# Patient Record
Sex: Female | Born: 1960 | Race: White | Hispanic: No | Marital: Married | Smoking: Never smoker
Health system: Southern US, Community
[De-identification: ages and names within clinical notes are randomized; demographics above are authoritative.]

## PROBLEM LIST (undated history)

## (undated) DIAGNOSIS — K7689 Other specified diseases of liver: Secondary | ICD-10-CM

## (undated) DIAGNOSIS — M2550 Pain in unspecified joint: Secondary | ICD-10-CM

## (undated) DIAGNOSIS — M1990 Unspecified osteoarthritis, unspecified site: Secondary | ICD-10-CM

## (undated) DIAGNOSIS — M7631 Iliotibial band syndrome, right leg: Secondary | ICD-10-CM

## (undated) DIAGNOSIS — E785 Hyperlipidemia, unspecified: Secondary | ICD-10-CM

## (undated) DIAGNOSIS — F32A Depression, unspecified: Secondary | ICD-10-CM

## (undated) DIAGNOSIS — E669 Obesity, unspecified: Secondary | ICD-10-CM

## (undated) DIAGNOSIS — C439 Malignant melanoma of skin, unspecified: Secondary | ICD-10-CM

## (undated) DIAGNOSIS — R0602 Shortness of breath: Secondary | ICD-10-CM

## (undated) DIAGNOSIS — K635 Polyp of colon: Secondary | ICD-10-CM

## (undated) DIAGNOSIS — Z8711 Personal history of peptic ulcer disease: Secondary | ICD-10-CM

## (undated) DIAGNOSIS — K829 Disease of gallbladder, unspecified: Secondary | ICD-10-CM

## (undated) DIAGNOSIS — R002 Palpitations: Secondary | ICD-10-CM

## (undated) DIAGNOSIS — K219 Gastro-esophageal reflux disease without esophagitis: Secondary | ICD-10-CM

## (undated) DIAGNOSIS — Z8719 Personal history of other diseases of the digestive system: Secondary | ICD-10-CM

## (undated) DIAGNOSIS — L709 Acne, unspecified: Secondary | ICD-10-CM

## (undated) DIAGNOSIS — IMO0002 Reserved for concepts with insufficient information to code with codable children: Secondary | ICD-10-CM

## (undated) DIAGNOSIS — M549 Dorsalgia, unspecified: Secondary | ICD-10-CM

## (undated) DIAGNOSIS — C9000 Multiple myeloma not having achieved remission: Secondary | ICD-10-CM

## (undated) DIAGNOSIS — R7309 Other abnormal glucose: Secondary | ICD-10-CM

## (undated) DIAGNOSIS — E559 Vitamin D deficiency, unspecified: Secondary | ICD-10-CM

## (undated) HISTORY — DX: Hyperlipidemia, unspecified: E78.5

## (undated) HISTORY — DX: Personal history of peptic ulcer disease: Z87.11

## (undated) HISTORY — DX: Other abnormal glucose: R73.09

## (undated) HISTORY — PX: OTHER SURGICAL HISTORY: SHX169

## (undated) HISTORY — DX: Acne, unspecified: L70.9

## (undated) HISTORY — DX: Other specified diseases of liver: K76.89

## (undated) HISTORY — DX: Malignant melanoma of skin, unspecified: C43.9

## (undated) HISTORY — DX: Polyp of colon: K63.5

## (undated) HISTORY — DX: Dorsalgia, unspecified: M54.9

## (undated) HISTORY — DX: Iliotibial band syndrome, right leg: M76.31

## (undated) HISTORY — DX: Shortness of breath: R06.02

## (undated) HISTORY — DX: Unspecified osteoarthritis, unspecified site: M19.90

## (undated) HISTORY — DX: Palpitations: R00.2

## (undated) HISTORY — DX: Disease of gallbladder, unspecified: K82.9

## (undated) HISTORY — DX: Obesity, unspecified: E66.9

## (undated) HISTORY — DX: Depression, unspecified: F32.A

## (undated) HISTORY — DX: Pain in unspecified joint: M25.50

## (undated) HISTORY — DX: Multiple myeloma not having achieved remission: C90.00

## (undated) HISTORY — DX: Personal history of other diseases of the digestive system: Z87.19

## (undated) HISTORY — DX: Gastro-esophageal reflux disease without esophagitis: K21.9

## (undated) HISTORY — DX: Reserved for concepts with insufficient information to code with codable children: IMO0002

## (undated) HISTORY — DX: Vitamin D deficiency, unspecified: E55.9

---

## 1997-10-15 ENCOUNTER — Inpatient Hospital Stay (HOSPITAL_COMMUNITY): Admission: AD | Admit: 1997-10-15 | Discharge: 1997-10-15 | Payer: Self-pay | Admitting: Obstetrics and Gynecology

## 1997-11-27 ENCOUNTER — Inpatient Hospital Stay (HOSPITAL_COMMUNITY): Admission: AD | Admit: 1997-11-27 | Discharge: 1997-11-27 | Payer: Self-pay | Admitting: *Deleted

## 1997-12-10 ENCOUNTER — Inpatient Hospital Stay (HOSPITAL_COMMUNITY): Admission: AD | Admit: 1997-12-10 | Discharge: 1997-12-10 | Payer: Self-pay | Admitting: Obstetrics & Gynecology

## 1997-12-11 ENCOUNTER — Inpatient Hospital Stay (HOSPITAL_COMMUNITY): Admission: AD | Admit: 1997-12-11 | Discharge: 1997-12-11 | Payer: Self-pay | Admitting: Obstetrics and Gynecology

## 1997-12-26 ENCOUNTER — Inpatient Hospital Stay (HOSPITAL_COMMUNITY): Admission: AD | Admit: 1997-12-26 | Discharge: 1997-12-26 | Payer: Self-pay | Admitting: Obstetrics & Gynecology

## 1997-12-27 ENCOUNTER — Inpatient Hospital Stay (HOSPITAL_COMMUNITY): Admission: AD | Admit: 1997-12-27 | Discharge: 1997-12-29 | Payer: Self-pay | Admitting: Obstetrics and Gynecology

## 1998-02-02 ENCOUNTER — Other Ambulatory Visit: Admission: RE | Admit: 1998-02-02 | Discharge: 1998-02-02 | Payer: Self-pay | Admitting: Obstetrics and Gynecology

## 1999-03-29 ENCOUNTER — Other Ambulatory Visit: Admission: RE | Admit: 1999-03-29 | Discharge: 1999-03-29 | Payer: Self-pay | Admitting: Obstetrics and Gynecology

## 2000-03-29 ENCOUNTER — Other Ambulatory Visit: Admission: RE | Admit: 2000-03-29 | Discharge: 2000-03-29 | Payer: Self-pay | Admitting: Obstetrics and Gynecology

## 2001-04-13 ENCOUNTER — Other Ambulatory Visit: Admission: RE | Admit: 2001-04-13 | Discharge: 2001-04-13 | Payer: Self-pay | Admitting: Obstetrics and Gynecology

## 2002-05-17 ENCOUNTER — Other Ambulatory Visit: Admission: RE | Admit: 2002-05-17 | Discharge: 2002-05-17 | Payer: Self-pay | Admitting: Obstetrics and Gynecology

## 2003-04-11 ENCOUNTER — Encounter: Payer: Self-pay | Admitting: Internal Medicine

## 2003-04-11 ENCOUNTER — Ambulatory Visit (HOSPITAL_COMMUNITY): Admission: RE | Admit: 2003-04-11 | Discharge: 2003-04-11 | Payer: Self-pay | Admitting: Internal Medicine

## 2003-07-28 ENCOUNTER — Other Ambulatory Visit: Admission: RE | Admit: 2003-07-28 | Discharge: 2003-07-28 | Payer: Self-pay | Admitting: Obstetrics and Gynecology

## 2003-09-16 ENCOUNTER — Inpatient Hospital Stay (HOSPITAL_COMMUNITY): Admission: RE | Admit: 2003-09-16 | Discharge: 2003-09-20 | Payer: Self-pay | Admitting: General Surgery

## 2003-09-16 ENCOUNTER — Encounter (INDEPENDENT_AMBULATORY_CARE_PROVIDER_SITE_OTHER): Payer: Self-pay | Admitting: Specialist

## 2003-09-16 HISTORY — PX: COLECTOMY: SHX59

## 2003-09-25 ENCOUNTER — Ambulatory Visit (HOSPITAL_COMMUNITY): Admission: RE | Admit: 2003-09-25 | Discharge: 2003-09-25 | Payer: Self-pay | Admitting: General Surgery

## 2004-07-08 ENCOUNTER — Emergency Department (HOSPITAL_COMMUNITY): Admission: EM | Admit: 2004-07-08 | Discharge: 2004-07-08 | Payer: Self-pay | Admitting: Emergency Medicine

## 2004-07-15 ENCOUNTER — Ambulatory Visit: Payer: Self-pay | Admitting: Internal Medicine

## 2004-07-16 ENCOUNTER — Ambulatory Visit: Payer: Self-pay | Admitting: Internal Medicine

## 2004-09-03 ENCOUNTER — Other Ambulatory Visit: Admission: RE | Admit: 2004-09-03 | Discharge: 2004-09-03 | Payer: Self-pay | Admitting: Obstetrics and Gynecology

## 2005-01-07 ENCOUNTER — Ambulatory Visit: Payer: Self-pay | Admitting: Family Medicine

## 2005-10-20 ENCOUNTER — Other Ambulatory Visit: Admission: RE | Admit: 2005-10-20 | Discharge: 2005-10-20 | Payer: Self-pay | Admitting: Obstetrics and Gynecology

## 2005-12-26 ENCOUNTER — Ambulatory Visit: Payer: Self-pay | Admitting: Internal Medicine

## 2006-06-28 ENCOUNTER — Ambulatory Visit: Payer: Self-pay | Admitting: Internal Medicine

## 2006-12-11 ENCOUNTER — Ambulatory Visit: Payer: Self-pay | Admitting: Internal Medicine

## 2006-12-11 LAB — CONVERTED CEMR LAB
ALT: 15 units/L (ref 0–40)
AST: 15 units/L (ref 0–37)
Albumin: 3.4 g/dL — ABNORMAL LOW (ref 3.5–5.2)
Alkaline Phosphatase: 26 units/L — ABNORMAL LOW (ref 39–117)
BUN: 13 mg/dL (ref 6–23)
Basophils Absolute: 0 10*3/uL (ref 0.0–0.1)
Basophils Relative: 0.4 % (ref 0.0–1.0)
Bilirubin, Direct: 0.1 mg/dL (ref 0.0–0.3)
CO2: 27 meq/L (ref 19–32)
Calcium: 8.9 mg/dL (ref 8.4–10.5)
Chloride: 111 meq/L (ref 96–112)
Cholesterol: 216 mg/dL (ref 0–200)
Cortisol, Plasma: 18.4 ug/dL
Creatinine, Ser: 1 mg/dL (ref 0.4–1.2)
Direct LDL: 101.9 mg/dL
Eosinophils Absolute: 0.1 10*3/uL (ref 0.0–0.6)
Eosinophils Relative: 1.2 % (ref 0.0–5.0)
GFR calc Af Amer: 78 mL/min
GFR calc non Af Amer: 64 mL/min
Glucose, Bld: 110 mg/dL — ABNORMAL HIGH (ref 70–99)
HCT: 40 % (ref 36.0–46.0)
HDL: 70.9 mg/dL (ref >39.0)
Hemoglobin: 14 g/dL (ref 12.0–15.0)
Hgb A1c MFr Bld: 5.6 % (ref 4.6–6.0)
Lymphocytes Relative: 31.1 % (ref 12.0–46.0)
MCHC: 35.1 g/dL (ref 30.0–36.0)
MCV: 89.8 fL (ref 78.0–100.0)
Monocytes Absolute: 0.3 10*3/uL (ref 0.2–0.7)
Monocytes Relative: 6.1 % (ref 3.0–11.0)
Neutro Abs: 2.8 10*3/uL (ref 1.4–7.7)
Neutrophils Relative %: 61.2 % (ref 43.0–77.0)
Platelets: 246 10*3/uL (ref 150–400)
Potassium: 3.9 meq/L (ref 3.5–5.1)
RBC: 4.46 M/uL (ref 3.87–5.11)
RDW: 12.2 % (ref 11.5–14.6)
Sodium: 141 meq/L (ref 135–145)
TSH: 2.76 microintl units/mL (ref 0.35–5.50)
Total Bilirubin: 0.6 mg/dL (ref 0.3–1.2)
Total CHOL/HDL Ratio: 3
Total Protein: 6.1 g/dL (ref 6.0–8.3)
Triglycerides: 186 mg/dL — ABNORMAL HIGH (ref 0–149)
VLDL: 37 mg/dL (ref 0–40)
WBC: 4.7 10*3/uL (ref 4.5–10.5)

## 2006-12-18 ENCOUNTER — Ambulatory Visit: Payer: Self-pay | Admitting: Internal Medicine

## 2006-12-20 DIAGNOSIS — R319 Hematuria, unspecified: Secondary | ICD-10-CM | POA: Insufficient documentation

## 2006-12-20 DIAGNOSIS — Z8719 Personal history of other diseases of the digestive system: Secondary | ICD-10-CM | POA: Insufficient documentation

## 2006-12-27 ENCOUNTER — Ambulatory Visit: Payer: Self-pay | Admitting: Internal Medicine

## 2007-02-20 ENCOUNTER — Ambulatory Visit: Payer: Self-pay | Admitting: Internal Medicine

## 2007-02-20 DIAGNOSIS — E785 Hyperlipidemia, unspecified: Secondary | ICD-10-CM | POA: Insufficient documentation

## 2007-02-26 DIAGNOSIS — R7309 Other abnormal glucose: Secondary | ICD-10-CM | POA: Insufficient documentation

## 2007-02-27 ENCOUNTER — Encounter: Payer: Self-pay | Admitting: Internal Medicine

## 2007-02-27 ENCOUNTER — Ambulatory Visit: Payer: Self-pay

## 2007-03-29 ENCOUNTER — Telehealth (INDEPENDENT_AMBULATORY_CARE_PROVIDER_SITE_OTHER): Payer: Self-pay | Admitting: *Deleted

## 2007-11-30 ENCOUNTER — Ambulatory Visit: Payer: Self-pay | Admitting: Family Medicine

## 2007-12-04 LAB — CONVERTED CEMR LAB
ALT: 13 units/L (ref 0–35)
AST: 14 units/L (ref 0–37)
Albumin: 4.3 g/dL (ref 3.5–5.2)
Alkaline Phosphatase: 34 units/L — ABNORMAL LOW (ref 39–117)
BUN: 15 mg/dL (ref 6–23)
Basophils Absolute: 0 10*3/uL (ref 0.0–0.1)
Basophils Relative: 1 % (ref 0–1)
Bilirubin, Direct: 0.1 mg/dL (ref 0.0–0.3)
CO2: 24 meq/L (ref 19–32)
Calcium: 10.1 mg/dL (ref 8.4–10.5)
Chloride: 101 meq/L (ref 96–112)
Creatinine, Ser: 0.93 mg/dL (ref 0.40–1.20)
Eosinophils Absolute: 0 10*3/uL (ref 0.0–0.7)
Eosinophils Relative: 0 % (ref 0–5)
Glucose, Bld: 88 mg/dL (ref 70–99)
HCT: 43.4 % (ref 36.0–46.0)
Hemoglobin: 15 g/dL (ref 12.0–15.0)
Indirect Bilirubin: 0.4 mg/dL (ref 0.0–0.9)
Lymphocytes Relative: 30 % (ref 12–46)
Lymphs Abs: 2 10*3/uL (ref 0.7–4.0)
MCHC: 34.6 g/dL (ref 30.0–36.0)
MCV: 87 fL (ref 78.0–100.0)
Monocytes Absolute: 0.4 10*3/uL (ref 0.1–1.0)
Monocytes Relative: 6 % (ref 3–12)
Neutro Abs: 4.2 10*3/uL (ref 1.7–7.7)
Neutrophils Relative %: 64 % (ref 43–77)
Platelets: 274 10*3/uL (ref 150–400)
Potassium: 4.6 meq/L (ref 3.5–5.3)
RBC: 4.99 M/uL (ref 3.87–5.11)
RDW: 13.1 % (ref 11.5–15.5)
Sodium: 138 meq/L (ref 135–145)
TSH: 2.503 microintl units/mL (ref 0.350–5.50)
Total Bilirubin: 0.5 mg/dL (ref 0.3–1.2)
Total Protein: 7.1 g/dL (ref 6.0–8.3)
WBC: 6.6 10*3/uL (ref 4.0–10.5)

## 2007-12-05 ENCOUNTER — Encounter: Payer: Self-pay | Admitting: Family Medicine

## 2007-12-07 ENCOUNTER — Ambulatory Visit: Payer: Self-pay | Admitting: Internal Medicine

## 2007-12-13 ENCOUNTER — Encounter: Payer: Self-pay | Admitting: Internal Medicine

## 2009-03-04 LAB — CONVERTED CEMR LAB: Pap Smear: NORMAL

## 2009-03-31 ENCOUNTER — Ambulatory Visit: Payer: Self-pay | Admitting: Internal Medicine

## 2009-04-01 ENCOUNTER — Ambulatory Visit: Payer: Self-pay | Admitting: Family Medicine

## 2009-04-03 ENCOUNTER — Ambulatory Visit: Payer: Self-pay | Admitting: Internal Medicine

## 2009-04-03 LAB — CONVERTED CEMR LAB
ALT: 15 units/L (ref 0–35)
AST: 16 units/L (ref 0–37)
Albumin: 3.6 g/dL (ref 3.5–5.2)
Alkaline Phosphatase: 32 units/L — ABNORMAL LOW (ref 39–117)
BUN: 15 mg/dL (ref 6–23)
Basophils Absolute: 0 10*3/uL (ref 0.0–0.1)
Basophils Relative: 0.8 % (ref 0.0–3.0)
Bilirubin Urine: NEGATIVE
Bilirubin, Direct: 0 mg/dL (ref 0.0–0.3)
Blood in Urine, dipstick: NEGATIVE
CO2: 29 meq/L (ref 19–32)
Calcium: 9 mg/dL (ref 8.4–10.5)
Chloride: 107 meq/L (ref 96–112)
Cholesterol: 202 mg/dL — ABNORMAL HIGH (ref 0–200)
Creatinine, Ser: 1 mg/dL (ref 0.4–1.2)
Direct LDL: 122.5 mg/dL
Eosinophils Absolute: 0 10*3/uL (ref 0.0–0.7)
Eosinophils Relative: 0.8 % (ref 0.0–5.0)
GFR calc non Af Amer: 63.49 mL/min (ref >60)
Glucose, Bld: 86 mg/dL (ref 70–99)
Glucose, Urine, Semiquant: NEGATIVE
HCT: 40.6 % (ref 36.0–46.0)
HDL: 61.3 mg/dL (ref >39.00)
Hemoglobin: 13.9 g/dL (ref 12.0–15.0)
Ketones, urine, test strip: NEGATIVE
Lymphocytes Relative: 28.6 % (ref 12.0–46.0)
Lymphs Abs: 1.5 10*3/uL (ref 0.7–4.0)
MCHC: 34.2 g/dL (ref 30.0–36.0)
MCV: 91.5 fL (ref 78.0–100.0)
Monocytes Absolute: 0.3 10*3/uL (ref 0.1–1.0)
Monocytes Relative: 6.2 % (ref 3.0–12.0)
Neutro Abs: 3.5 10*3/uL (ref 1.4–7.7)
Neutrophils Relative %: 63.6 % (ref 43.0–77.0)
Nitrite: NEGATIVE
Platelets: 220 10*3/uL (ref 150.0–400.0)
Potassium: 4.3 meq/L (ref 3.5–5.1)
Protein, U semiquant: NEGATIVE
RBC: 4.44 M/uL (ref 3.87–5.11)
RDW: 11.6 % (ref 11.5–14.6)
Sodium: 139 meq/L (ref 135–145)
Specific Gravity, Urine: 1.02
TSH: 1.25 microintl units/mL (ref 0.35–5.50)
Total Bilirubin: 0.5 mg/dL (ref 0.3–1.2)
Total CHOL/HDL Ratio: 3
Total Protein: 6.1 g/dL (ref 6.0–8.3)
Triglycerides: 112 mg/dL (ref 0.0–149.0)
Urobilinogen, UA: 0.2
VLDL: 22.4 mg/dL (ref 0.0–40.0)
WBC Urine, dipstick: NEGATIVE
WBC: 5.3 10*3/uL (ref 4.5–10.5)
pH: 5.5

## 2009-04-09 ENCOUNTER — Ambulatory Visit: Payer: Self-pay | Admitting: Internal Medicine

## 2009-05-25 ENCOUNTER — Ambulatory Visit: Payer: Self-pay | Admitting: Internal Medicine

## 2009-08-21 ENCOUNTER — Ambulatory Visit: Payer: Self-pay | Admitting: Internal Medicine

## 2009-08-21 DIAGNOSIS — R002 Palpitations: Secondary | ICD-10-CM | POA: Insufficient documentation

## 2009-08-21 DIAGNOSIS — E669 Obesity, unspecified: Secondary | ICD-10-CM | POA: Insufficient documentation

## 2009-11-06 ENCOUNTER — Ambulatory Visit: Payer: Self-pay | Admitting: Internal Medicine

## 2010-07-04 LAB — HM MAMMOGRAPHY: HM Mammogram: NORMAL

## 2010-07-04 LAB — HM PAP SMEAR: HM Pap smear: ABNORMAL

## 2010-08-05 NOTE — Consult Note (Signed)
Summary: Dr Jenne Pane note  Dr Jenne Pane note   Imported By: Kassie Mends 01/02/2008 09:10:28  _____________________________________________________________________  External Attachment:    Type:   Image     Comment:   Dr Jenne Pane note

## 2010-08-05 NOTE — Assessment & Plan Note (Signed)
Summary: bilateral ear fullness/dm   Vital Signs:  Patient profile:   50 year old female Weight:      183 pounds Temp:     98.4 degrees F Pulse rate:   64 / minute Resp:     12 per minute BP sitting:   112 / 68  (left arm)  Vitals Entered By: Gladis Riffle, RN (March 31, 2009 11:48 AM)  History of Present Illness: ear pressure for several weeks no pain slightly dizzy (resolved)  Allergies: 1)  Penicillin V Potassium (Penicillin V Potassium)  Comments:  Nurse/Medical Assistant: c/o bilateral ear fullness since Labor Day weekend, had ears candled last week--ears feel "ec and difficulty hearinghoy"--declines flu shot  The patient's medications and allergies were reviewed with the patient and were updated in the Medication and Allergy Lists. Gladis Riffle, RN (March 31, 2009 11:50 AM)  Past History:  Past Medical History: Last updated: 02/26/2007 Diverticulitis, hx of increased glucose hematuria  2008 Hyperlipidemia hyperglycemia  Past Surgical History: Last updated: 12/20/2006 partial colectomy  2005  Family History: Last updated: 02/20/2007 valvular heart disease--mother mother s/p angioplasty Family History of CAD Female 1st degree relative <60 PGF-MI in his 28s  Social History: Last updated: 12/07/2007 Married Regular exercise-yes works for radio advertising  Risk Factors: Exercise: yes (02/20/2007)  Risk Factors: Smoking Status: never (12/20/2006)  Review of Systems       All other systems reviewed and were negative   Physical Exam  General:  Well-developed,well-nourished,in no acute distress; alert,appropriate and cooperative throughout examination Head:  normocephalic and atraumatic.   Eyes:  pupils equal and pupils round.   Ears:  External ear exam shows no significant lesions or deformities.  Otoscopic examination reveals clear canals, tympanic membranes are intact bilaterally without bulging, retraction, inflammation or discharge.  Hearing is grossly normal bilaterally. Neck:  No deformities, masses, or tenderness noted.   Impression & Recommendations:  Problem # 1:  EUSTACHIAN TUBE DYSFUNCTION, BILATERAL (ICD-381.81) eustachian tube dysfunction--by history no obviousl abnormalities discussed valsalva maneuver she will call for worsening of sxs  Complete Medication List: 1)  Yaz 3-0.02 Mg Tabs (Drospirenone-ethinyl estradiol) 2)  Cyclobenzaprine Hcl 10 Mg Tabs (Cyclobenzaprine hcl) .... One by mouth q 8 hours as needed muscle spasm
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
## 2010-08-05 NOTE — Progress Notes (Signed)
Summary: Refill for xanax  Phone Note Call from Patient Call back at [phone]   Caller: Patient Call For: Dr Cato Mulligan Reason for Call: Refill Medication Summary of Call: pt will be having a procedure done on leg on 9/26 and would like to get a rx for xanax. pt would like to pickup today. Pls call when ready to pickup  Initial call taken by: Shan Levans,  March 29, 2007 8:58 AM  Follow-up for Phone Call        Pt is having a skin lesion removed and wanted to take Xanax before her procedure.  Suggested she discuss with the surgeon for this particular situation.  She then asked if we would refill her Xanax anyway.  You gave her #10  Follow-up by: Lynann Beaver CMA,  March 29, 2007 9:54 AM  Additional Follow-up for Phone Call Additional follow up Details #1::        ok Additional Follow-up by: Birdie Sons MD,  March 29, 2007 10:56 AM    Additional Follow-up for Phone Call Additional follow up Details #2::    #10 Alprazolam .25 mg, one by mouth as needed [facility] into [facility], pt informed Follow-up by: Sid Falcon LPN,  March 29, 2007 5:01 PM  New/Updated Medications: ALPRAZOLAM 0.25 MG  TABS (ALPRAZOLAM) one by mouth as needed   Prescriptions: ALPRAZOLAM 0.25 MG  TABS (ALPRAZOLAM) one by mouth as needed  #10 x 0   Entered by:   Sid Falcon LPN   Authorized by:   Birdie Sons MD   Signed by:   Sid Falcon LPN on 62/13/0865   Method used:   Telephoned to ...         RxID:   [id number]

## 2010-08-05 NOTE — Assessment & Plan Note (Signed)
Summary: CP OFF & ON X2WKS OF CONCERN/NOT RIGHT/PS  Medications Added PHENTERMINE HCL 37.5 MG  CAPS (PHENTERMINE HCL) 1/2 bid        Vital Signs:  Patient Profile:   50 Years Old Female Weight:      169 pounds Temp:     98.1 degrees F oral Pulse rate:   76 / minute Pulse rhythm:   regular Resp:     12 per minute BP sitting:   120 / 82  Vitals Entered By: Lynann Beaver CMA (February 20, 2007 8:32 AM)               Chief Complaint:  chest pains with left arm numbness.  Acute Visit History:      The patient complains of chest pain.  She denies abdominal pain, constipation, cough, diarrhea, earache, eye symptoms, fever, genitourinary symptoms, headache, musculoskeletal symptoms, nasal discharge, nausea, rash, sinus problems, sore throat, and vomiting.  Other comments include: she thinks sxs are related to phentermine. since decreasing dose she thinks sxs are somewhat better.  she is able to exercise without chest pain or SOB.        The chest pain is located in the left anterior chest region and radiates to the left shoulder.  She describes the quality as a sharp sensation.  On a scale of 1-10, the intensity is described as a 3 (with 10 being the most severe).  The pain started 1 week ago.  Each episode of pain lasts more than 20 minutes.  She is currently not having any pain.  She has had no chest pain in the last 24 hours.        The chest pain is made worse with stress.        Other comments include:  startedd diet pills, caused insomnia, she decreased dose.                Past Medical History:    Reviewed history from 12/20/2006 and no changes required:       Diverticulitis, hx of       increased glucose       hematuria  2008       Hyperlipidemia  Past Surgical History:    Reviewed history from 12/20/2006 and no changes required:       partial colectomy  2005   Family History:    valvular heart disease--mother    mother s/p angioplasty    Family History of CAD  Female 1st degree relative <60    PGF-MI in his 20s  Social History:    Married    Regular exercise-yes   Risk Factors:  Exercise:  yes   Review of Systems       no other complaints---   Physical Exam  General:     Well-developed,well-nourished,in no acute distress; alert,appropriate and cooperative throughout examination Head:     Normocephalic and atraumatic without obvious abnormalities. No apparent alopecia or balding. Eyes:     No corneal or conjunctival inflammation noted. EOMI. Perrla. Funduscopic exam benign, without hemorrhages, exudates or papilledema. Vision grossly normal. Mouth:     Oral mucosa and oropharynx without lesions or exudates.  Teeth in good repair. Neck:     No deformities, masses, or tenderness noted. Chest Wall:     No deformities, masses, or tenderness noted. Lungs:     Normal respiratory effort, chest expands symmetrically. Lungs are clear to auscultation, no crackles or wheezes. Heart:     Normal rate and  regular rhythm. S1 and S2 normal without gallop, murmur, click, rub or other extra sounds. Abdomen:     Bowel sounds positive,abdomen soft and non-tender without masses, organomegaly or hernias noted. Msk:     No deformity or scoliosis noted of thoracic or lumbar spine.   Extremities:     No clubbing, cyanosis, edema, or deformity noted with normal full range of motion of all joints.      Impression & Recommendations:  Problem # 1:  CHEST PAIN (ICD-786.50) stress myoview note risk facotrs and abnormal EKG Orders: Cardiology Referral (Cardiology)  discussed at length, she needs further evaluation.    Problem # 2:  FAMILY HISTORY OF CAD FEMALE 1ST DEGREE RELATIVE <60 (ICD-V16.49)  Orders: Cardiology Referral (Cardiology)   Problem # 3:  HYPERLIPIDEMIA (ICD-272.4)  Orders: Cardiology Referral (Cardiology)   Complete Medication List: 1)  Phentermine Hcl 37.5 Mg Caps (Phentermine hcl) .... 1/2 bid      Appended  Document: Orders Update     Clinical Lists Changes  Orders: Added new Service order of EKG w/ Interpretation (93000) - Signed

## 2010-08-05 NOTE — Assessment & Plan Note (Signed)
Summary: 2 MONTH ROV/NJR PT RSC/NJR   Vital Signs:  Patient profile:   50 year old female Menstrual status:  irregular Weight:      167 pounds BMI:     27.47 Temp:     98.2 degrees F Pulse rate:   68 / minute Pulse rhythm:   regular Resp:     12 per minute BP sitting:   108 / 72  (left arm) Cuff size:   regular  Vitals Entered By: Gladis Riffle, RN (Nov 06, 2009 8:19 AM) CC: 2 month rov--requests to go back on phentermine 30mg  once a day Is Patient Diabetic? No   CC:  2 month rov--requests to go back on phentermine 30mg  once a day.  History of Present Illness:  Follow-Up Visit      This is a 50 year old woman who presents for Follow-up visit.  The patient denies chest pain and palpitations.  Since the last visit the patient notes no new problems or concerns.  The patient reports taking meds as prescribed.  When questioned about possible medication side effects, the patient notes none.    All other systems reviewed and were negative   Preventive Screening-Counseling & Management  Alcohol-Tobacco     Smoking Status: never  Current Problems (verified): 1)  Palpitations  (ICD-785.1) 2)  Obesity  (ICD-278.00) 3)  Preventive Health Care  (ICD-V70.0) 4)  Hyperglycemia  (ICD-790.29) 5)  Hyperlipidemia  (ICD-272.4) 6)  Family History of Cad Female 1st Degree Relative <60  (ICD-V16.49) 7)  Hematuria  (ICD-599.7) 8)  Diverticulitis, Hx of  (ICD-V12.79)  Current Medications (verified): 1)  Yaz 3-0.02 Mg Tabs (Drospirenone-Ethinyl Estradiol) 2)  Phentermine Hcl 15 Mg Caps (Phentermine Hcl) .... 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   Head:  normocephalic and atraumatic.   Eyes:  pupils equal and pupils round.   Ears:  R ear normal and L ear normal.   Nose:  no external deformity and no external erythema.   Neck:  No deformities, masses, or tenderness noted. Chest Wall:  No deformities, masses, or tenderness noted. Lungs:  normal respiratory effort and no intercostal retractions.   Heart:  normal rate and regular rhythm.   Abdomen:  soft and non-tender.   Msk:  No deformity or scoliosis noted of thoracic or lumbar spine.   Neurologic:  cranial nerves II-XII intact and gait normal.   Skin:  turgor normal and color normal.   Psych:  normally interactive and good eye contact.     Impression & Recommendations:  Problem # 1:  PALPITATIONS (ICD-785.1) resolved AND using the higher dose of phentermine  Problem # 2:  OBESITY (ICD-278.00) she has had some weight loss using phentermine as needed  ok to continue.  Problem # 3:  HYPERLIPIDEMIA (ICD-272.4)  Labs Reviewed: SGOT: 16 (04/03/2009)   SGPT: 15 (04/03/2009)   HDL:61.30 (04/03/2009), 70.9 (12/11/2006)  LDL:DEL (12/11/2006)  Chol:202 (04/03/2009), 216 (12/11/2006)  Trig:112.0 (04/03/2009), 186 (12/11/2006)  Complete Medication List: 1)  Yaz 3-0.02 Mg Tabs (Drospirenone-ethinyl estradiol) 2)  Phentermine Hcl 30 Mg Caps (Phentermine hcl) .... Take 1 tablet by mouth once a day  Patient Instructions: 1)  2months Prescriptions: PHENTERMINE HCL 30 MG CAPS (PHENTERMINE  HCL) Take 1 tablet by mouth once a day  #30 x 1   Entered and Authorized by:   Birdie Sons MD   Signed by:   Birdie Sons MD on 11/06/2009   Method used:   Print then Give to Patient   RxID:   [id number]

## 2010-08-05 NOTE — Assessment & Plan Note (Signed)
Summary: "Flutter" sensation in throat,chest/nn   Vital Signs:  Patient Profile:   50 Years Old Female Weight:      174 pounds Temp:     98.2 degrees F oral Pulse rate:   83 / minute BP sitting:   122 / 84  (left arm) Cuff size:   regular  Vitals Entered By: Alfred Levins, CMA (Nov 30, 2007 2:32 PM)                 Chief Complaint:  "flutter" in throat & upper chest.  History of Present Illness: Over the past 2 days has had intermittent spells of a mild pressure sensation and a fluttering in the anterior throat area, just above the manubrium. No ST or fever or cough. No heartburn or trouble swallowing. No SOB or chest pain. She is very anxious that she may have a heart problem because both of her parents have heart issues. She had a normal cardiac stress test in 8-08 and a normal ECHO in 2002. She denies any possibility that this could be related to anxiety.     Current Allergies: PENICILLIN V POTASSIUM (PENICILLIN V POTASSIUM)  Past Medical History:    Reviewed history from 02/26/2007 and no changes required:       Diverticulitis, hx of       increased glucose       hematuria  2008       Hyperlipidemia       hyperglycemia     Review of Systems      See HPI   Physical Exam  General:     Well-developed,well-nourished,in no acute distress; alert,appropriate and cooperative throughout examination Neck:     No deformities, masses, or tenderness noted. Lungs:     Normal respiratory effort, chest expands symmetrically. Lungs are clear to auscultation, no crackles or wheezes. Heart:     Normal rate and regular rhythm. S1 and S2 normal without gallop, murmur, click, rub or other extra sounds. EKG normal except a single PVC Psych:     Oriented X3, normally interactive, good eye contact, and moderately anxious.      Impression & Recommendations:  Problem # 1:  PALPITATIONS (ICD-785.1)  Orders: Venipuncture (16109) TLB-BMP (Basic Metabolic Panel-BMET)  (80048-METABOL) TLB-CBC Platelet - w/Differential (85025-CBCD) TLB-Hepatic/Liver Function Pnl (80076-HEPATIC) TLB-TSH (Thyroid Stimulating Hormone) (84443-TSH) EKG w/ Interpretation (93000)   Complete Medication List: 1)  Phentermine Hcl 37.5 Mg Caps (Phentermine hcl) .... 1/2 bid 2)  Claritin 10 Mg Tabs (Loratadine) .... Take 1 tablet by mouth once a day 3)  Yaz 3-0.02 Mg Tabs (Drospirenone-ethinyl estradiol) 4)  Alprazolam 0.25 Mg Tabs (Alprazolam) .... One by mouth as needed   Patient Instructions: 1)  Check labs. In case this is related to GERD, try samples for Nexium 40 mg once daily . If it continues, we may set up a Holtor or event monitor.   ]

## 2010-08-05 NOTE — Assessment & Plan Note (Signed)
Summary: CPX/NO PAP/NJR   Vital Signs:  Patient profile:   50 year old female Menstrual status:  irregular LMP:     03/23/2009 Height:      65.5 inches Weight:      183 pounds BMI:     30.10 Pulse rate:   64 / minute Resp:     12 per minute BP sitting:   118 / 70  (left arm)  Vitals Entered By: Gladis Riffle, RN (April 09, 2009 8:07 AM)  History of Present Illness: cpx  still with neck pain  Current Problems (verified): 1)  Preventive Health Care  (ICD-V70.0) 2)  Hyperglycemia  (ICD-790.29) 3)  Hyperlipidemia  (ICD-272.4) 4)  Family History of Cad Female 1st Degree Relative <60  (ICD-V16.49) 5)  Hematuria  (ICD-599.7) 6)  Diverticulitis, Hx of  (ICD-V12.79)  Current Medications (verified): 1)  Yaz 3-0.02 Mg Tabs (Drospirenone-Ethinyl Estradiol)  Allergies: 1)  Penicillin V Potassium (Penicillin V Potassium)  Comments:  Nurse/Medical Assistant: cpx, has gyn, labs done--concerned about weight and insomnia--declines flu shot  The patient's medications were reviewed with the patient's parent and were updated in the Medication List. 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   Eyes:  pupils equal and pupils round.   Ears:  R ear normal and L ear normal.   Nose:  no external deformity and no external erythema.   Neck:  No deformities, masses, or tenderness noted. Chest Wall:  No deformities, masses, or tenderness noted. Lungs:  Normal respiratory effort, chest expands symmetrically. Lungs are clear to auscultation, no crackles or wheezes. Heart:  Normal rate and regular rhythm. S1 and S2 normal without gallop, murmur, click, rub or other extra sounds. Abdomen:  Bowel sounds positive,abdomen soft and non-tender without masses, organomegaly or hernias noted. Msk:  No deformity or scoliosis noted of thoracic or lumbar spine.   Pulses:  R radial normal and L radial normal.   Neurologic:  cranial nerves II-XII intact and gait normal.   Skin:  turgor normal and color normal.   Psych:  normally interactive and good eye contact.     Impression & Recommendations:  Problem # 1:  PREVENTIVE HEALTH CARE (ICD-V70.0) health maintenance UTD she has had trouble sleeping---lots of stress @ home---discussed melatonin  Problem # 2:  HYPERLIPIDEMIA (ICD-272.4) note HDL, no treatment Labs Reviewed: SGOT: 16 (04/03/2009)   SGPT: 15 (04/03/2009)   HDL:61.30 (04/03/2009), 70.9 (12/11/2006)  LDL:DEL (12/11/2006)  Chol:202 (04/03/2009), 216 (12/11/2006)  Trig:112.0 (04/03/2009), 186 (12/11/2006)  Problem # 3:  HYPERGLYCEMIA (ICD-790.29) resolved Labs Reviewed: Creat:  1.0 (04/03/2009)     Complete Medication List: 1)  Yaz 3-0.02 Mg Tabs (Drospirenone-ethinyl estradiol) 2)  Phentermine Hcl 30 Mg Caps (Phentermine hcl) .... Take 1 tablet by mouth  once a day  Preventive Care Screening  Mammogram:    Date:  03/04/2009    Next Due:  03/2011    Results:  normal-pt's report   Pap Smear:    Date:  03/04/2009    Next Due:  03/2012    Results:  normal-pt's report    Patient Instructions: 1)  6 weeks ROV 2)  sleep: try melatonin (package instructions) Prescriptions: PHENTERMINE HCL 30 MG CAPS (PHENTERMINE HCL) Take 1 tablet by mouth once a day  #30 x 3   Entered and Authorized by:   Birdie Sons MD   Signed by:   Birdie Sons MD on 04/09/2009   Method used:   Print then Give to Patient   RxID:   [id number]   Appended Document: CPX/NO PAP/NJR   Immunizations Administered:  Tetanus Vaccine:    Vaccine Type: Tdap    Site: left deltoid    Mfr: [employer]    Dose: 0.5 ml    Route: IM    Given by: Gladis Riffle, RN    Exp. Date: 01/17/2011    Lot #: [id number]

## 2010-08-05 NOTE — Assessment & Plan Note (Signed)
Summary: 6 WK ROV//SLM PT RSC/NJR/PT RSC/CJR   Vital Signs:  Patient profile:   50 year old female Menstrual status:  irregular Weight:      174 pounds Temp:     98.4 degrees F oral Pulse rate:   56 / minute Pulse rhythm:   regular BP sitting:   102 / 78  (left arm) Cuff size:   regular  Vitals Entered By: Kern Reap CMA Duncan Dull) (August 21, 2009 7:53 AM)  Reason for Visit 6 week follow up and rx concerns  History of Present Illness: f/u has not been using phentermine  she has noted a strange sensation of left arm---intermittent for 3 weeks. reproducible with stress. she describes the arm more as a "heavy sensation".  She does note one episode of heart fluttering/palpitations but not related to arm paresthesia no SOB, she is able to exercise vigorously without any sxs no nocturnal sxs  she notes fHx of valvluar heart dzs  Current Problems (verified): 1)  Preventive Health Care  (ICD-V70.0) 2)  Hyperglycemia  (ICD-790.29) 3)  Hyperlipidemia  (ICD-272.4) 4)  Family History of Cad Female 1st Degree Relative <60  (ICD-V16.49) 5)  Hematuria  (ICD-599.7) 6)  Diverticulitis, Hx of  (ICD-V12.79)  Current Medications (verified): 1)  Yaz 3-0.02 Mg Tabs (Drospirenone-Ethinyl Estradiol) 2)  Phentermine Hcl 30 Mg Caps (Phentermine Hcl) .... Take 1 Tablet By Mouth Once A Day  Allergies: 1)  Penicillin V Potassium (Penicillin V Potassium)  Past History:  Past Medical History: Last updated: 02/26/2007 Diverticulitis, hx of increased glucose hematuria  2008 Hyperlipidemia hyperglycemia  Past Surgical History: Last updated: 12/20/2006 partial colectomy  2005  Family History: Last updated: 08/21/2009 valvular heart disease--mother (required surgery) mother s/p angioplasty Family History of CAD Female 1st degree relative <60 PGF-MI in his 47s father--healthy  Social History: Last updated: 04/09/2009 Married Regular exercise-yes works for radio advertising 2  kids---health  Risk Factors: Exercise: yes (02/20/2007)  Risk Factors: Smoking Status: never (05/25/2009)  Family History: valvular heart disease--mother (required surgery) mother s/p angioplasty Family History of CAD Female 1st degree relative <60 PGF-MI in his 75s father--healthy  Review of Systems       All other systems reviewed and were negative   Physical Exam  General:  Well-developed,well-nourished,in no acute distress; alert,appropriate and cooperative throughout examination Head:  normocephalic and atraumatic.   Eyes:  pupils equal and pupils round.   Neck:  No deformities, masses, or tenderness noted. Chest Wall:  No deformities, masses, or tenderness noted. Lungs:  Normal respiratory effort, chest expands symmetrically. Lungs are clear to auscultation, no crackles or wheezes. Heart:  Normal rate and regular rhythm. S1 and S2 normal without gallop, murmur, click, rub or other extra sounds. Abdomen:  Bowel sounds positive,abdomen soft and non-tender without masses, organomegaly or hernias noted. Msk:  No deformity or scoliosis noted of thoracic or lumbar spine.   Pulses:  R radial normal and L radial normal.   Neurologic:  cranial nerves II-XII intact and gait normal.   Skin:  turgor normal and color normal.   Psych:  normally interactive and good eye contact.     Impression & Recommendations:  Problem # 1:  OBESITY (ICD-278.00) overweight-- ok to continue phentermine  Problem # 2:  PALPITATIONS (ICD-785.1) I do not think this is an SVT. no indication of WPW she will call if sxs recur I suspect an anxiety reaction Orders: EKG w/ Interpretation (93000)  Complete Medication List: 1)  Yaz 3-0.02 Mg Tabs (Drospirenone-ethinyl estradiol)  2)  Phentermine Hcl 15 Mg Caps (Phentermine hcl) .... Take 1 tablet by mouth two times a day  Patient Instructions: 1)  Please schedule a follow-up appointment in 2 months. Prescriptions: PHENTERMINE HCL 15 MG CAPS  (PHENTERMINE HCL) Take 1 tablet by mouth two times a day  #60 x 3   Entered and Authorized by:   Birdie Sons MD   Signed by:   Birdie Sons MD on 08/21/2009   Method used:   Print then Give to Patient   RxID:   [id number] PHENTERMINE HCL 30 MG CAPS (PHENTERMINE HCL) Take 1 tablet by mouth once a day  #30 x 3   Entered and Authorized by:   Birdie Sons MD   Signed by:   Birdie Sons MD on 08/21/2009   Method used:   Print then Give to Patient   RxID:   [id number]  ---canceled 30 mg---not given to patient

## 2010-08-20 ENCOUNTER — Telehealth: Payer: Self-pay | Admitting: Internal Medicine

## 2010-08-20 ENCOUNTER — Other Ambulatory Visit: Payer: Self-pay | Admitting: Internal Medicine

## 2010-08-20 ENCOUNTER — Other Ambulatory Visit: Payer: BC Managed Care – PPO

## 2010-08-20 ENCOUNTER — Encounter (INDEPENDENT_AMBULATORY_CARE_PROVIDER_SITE_OTHER): Payer: Self-pay | Admitting: *Deleted

## 2010-08-20 DIAGNOSIS — Z0000 Encounter for general adult medical examination without abnormal findings: Secondary | ICD-10-CM

## 2010-08-20 LAB — TSH: TSH: 2.47 u[IU]/mL (ref 0.35–5.50)

## 2010-08-20 LAB — HEPATIC FUNCTION PANEL
ALT: 12 U/L (ref 0–35)
AST: 15 U/L (ref 0–37)
Albumin: 3.6 g/dL (ref 3.5–5.2)
Alkaline Phosphatase: 32 U/L — ABNORMAL LOW (ref 39–117)
Bilirubin, Direct: 0.1 mg/dL (ref 0.0–0.3)
Total Bilirubin: 0.4 mg/dL (ref 0.3–1.2)
Total Protein: 6.1 g/dL (ref 6.0–8.3)

## 2010-08-20 LAB — CBC WITH DIFFERENTIAL/PLATELET
Basophils Absolute: 0 10*3/uL (ref 0.0–0.1)
Basophils Relative: 0.6 % (ref 0.0–3.0)
Eosinophils Absolute: 0 10*3/uL (ref 0.0–0.7)
Eosinophils Relative: 1 % (ref 0.0–5.0)
HCT: 40.2 % (ref 36.0–46.0)
Hemoglobin: 14.1 g/dL (ref 12.0–15.0)
Lymphocytes Relative: 25.4 % (ref 12.0–46.0)
Lymphs Abs: 1.2 10*3/uL (ref 0.7–4.0)
MCHC: 35 g/dL (ref 30.0–36.0)
MCV: 89.8 fl (ref 78.0–100.0)
Monocytes Absolute: 0.3 10*3/uL (ref 0.1–1.0)
Monocytes Relative: 6.5 % (ref 3.0–12.0)
Neutro Abs: 3.1 10*3/uL (ref 1.4–7.7)
Neutrophils Relative %: 66.5 % (ref 43.0–77.0)
Platelets: 213 10*3/uL (ref 150.0–400.0)
RBC: 4.48 Mil/uL (ref 3.87–5.11)
RDW: 12.7 % (ref 11.5–14.6)
WBC: 4.7 10*3/uL (ref 4.5–10.5)

## 2010-08-20 LAB — LIPID PANEL
Cholesterol: 180 mg/dL (ref 0–200)
HDL: 56.9 mg/dL (ref >39.00)
LDL Cholesterol: 94 mg/dL (ref 0–99)
Total CHOL/HDL Ratio: 3
Triglycerides: 147 mg/dL (ref 0.0–149.0)
VLDL: 29.4 mg/dL (ref 0.0–40.0)

## 2010-08-20 LAB — BASIC METABOLIC PANEL
BUN: 15 mg/dL (ref 6–23)
CO2: 27 mEq/L (ref 19–32)
Calcium: 8.9 mg/dL (ref 8.4–10.5)
Chloride: 104 mEq/L (ref 96–112)
Creatinine, Ser: 0.8 mg/dL (ref 0.4–1.2)
GFR: 79.35 mL/min (ref >60.00)
Glucose, Bld: 80 mg/dL (ref 70–99)
Potassium: 4.2 mEq/L (ref 3.5–5.1)
Sodium: 137 mEq/L (ref 135–145)

## 2010-08-20 LAB — URINALYSIS
Bilirubin Urine: NEGATIVE
Ketones, ur: NEGATIVE
Leukocytes, UA: NEGATIVE
Nitrite: NEGATIVE
Specific Gravity, Urine: 1.025 (ref 1.000–1.030)
Total Protein, Urine: NEGATIVE
Urine Glucose: NEGATIVE
Urobilinogen, UA: 0.2 (ref 0.0–1.0)
pH: 6 (ref 5.0–8.0)

## 2010-08-20 LAB — CORTISOL: Cortisol, Plasma: 17.4 ug/dL

## 2010-08-20 NOTE — Telephone Encounter (Signed)
Pt called and adv that she wants to have her cortisone levels? Checked..... Had bloodwork drawn this morning at [facility] and needs to have order added to same.....[id number].

## 2010-08-20 NOTE — Telephone Encounter (Signed)
Sent to [facility] to order Cortisol levels.

## 2010-08-20 NOTE — Telephone Encounter (Signed)
Ok to add

## 2010-08-26 ENCOUNTER — Other Ambulatory Visit: Payer: Self-pay | Admitting: Internal Medicine

## 2010-08-26 ENCOUNTER — Encounter: Payer: Self-pay | Admitting: Internal Medicine

## 2010-08-26 ENCOUNTER — Ambulatory Visit (INDEPENDENT_AMBULATORY_CARE_PROVIDER_SITE_OTHER): Payer: BC Managed Care – PPO | Admitting: Internal Medicine

## 2010-08-26 VITALS — BP 104/76 | HR 60 | Temp 99.0°F | Ht 65.25 in | Wt 186.0 lb

## 2010-08-26 DIAGNOSIS — E785 Hyperlipidemia, unspecified: Secondary | ICD-10-CM

## 2010-08-26 DIAGNOSIS — E669 Obesity, unspecified: Secondary | ICD-10-CM

## 2010-08-26 MED ORDER — PHENTERMINE HCL 15 MG PO CAPS: 15.0000 mg | ORAL_CAPSULE | ORAL | Status: DC

## 2010-08-26 NOTE — Progress Notes (Signed)
  Subjective:    Patient ID: FALLAN MCCAREY, female    DOB: September 02, 1960, 50 y.o.   MRN: [id number]  HPI  cpx Frustrated with weight. She started a new BCP She is exercising regularly and feels like she eats a good diet (seeing nutritionist tomorrow)  Review of Systems  patient denies chest pain, shortness of breath, orthopnea. Denies lower extremity edema, abdominal pain, change in appetite, change in bowel movements. Patient denies rashes, musculoskeletal complaints. No other specific complaints in a complete review of systems.      Objective:   Physical Exam  Well-developed well-nourished female in no acute distress. HEENT exam atraumatic, normocephalic, extraocular muscles are intact. Neck is supple. No jugular venous distention no thyromegaly. Chest clear to auscultation without increased work of breathing. Cardiac exam S1 and S2 are regular. Abdominal exam active bowel sounds, soft, nontender. Extremities no edema. Neurologic exam she is alert without any motor sensory deficits. Gait is normal.       Assessment & Plan:

## 2010-08-27 ENCOUNTER — Encounter: Payer: Self-pay | Admitting: Internal Medicine

## 2010-08-28 ENCOUNTER — Encounter: Payer: Self-pay | Admitting: Internal Medicine

## 2010-08-28 NOTE — Assessment & Plan Note (Signed)
Adequate control. Currently no medications. Advised weight loss and daily exercise.

## 2010-08-28 NOTE — Assessment & Plan Note (Signed)
Discussed at length. She is frustrated with her inability to lose weight. She feels that she is following a good diet. She has a nutrition appointment in the next week. She requests phentermine. I'll prescribe this. She understands the risks and accepts them all. She understands the side effects.

## 2010-09-01 LAB — GLIA (IGA/G) + TTG IGA
Gliadin IgA: 1.8 U/mL (ref <20)
Gliadin IgG: 40.8 U/mL — ABNORMAL HIGH (ref <20)
Tissue Transglutaminase Ab, IgA: 2 U/mL (ref <20)

## 2010-09-06 ENCOUNTER — Encounter (INDEPENDENT_AMBULATORY_CARE_PROVIDER_SITE_OTHER): Payer: Self-pay | Admitting: *Deleted

## 2010-09-14 NOTE — Letter (Signed)
Summary: New Patient letter  [facility]  [address] [place], [place] [address]   Phone: [phone]  Fax: [phone]       09/06/2010 MRN: [id number]  Katherine Beard [address] [place], [place]  [address]  [place]  Dear Katherine Beard,  Welcome to the Gastroenterology Division at [facility].    You are scheduled to see Dr.  Juanda Chance on 10-06-10 at 2:45P.M. on the 3rd floor at [facility], [address]  We ask that you try to arrive at our office 15 minutes prior to your appointment time to allow for check-in.  We would like you to complete the enclosed self-administered evaluation form prior to your visit and bring it with you on the day of your appointment.  We will review it with you.  Also, please bring a complete list of all your medications or, if you prefer, bring the medication bottles and we will list them.  Please bring your insurance card so that we may make a copy of it.  If your insurance requires a referral to see a specialist, please bring your referral form from your primary care physician.  Co-payments are due at the time of your visit and may be paid by cash, check or credit card.     Your office visit will consist of a consult with your physician (includes a physical exam), any laboratory testing he/she may order, scheduling of any necessary diagnostic testing (e.g. x-ray, ultrasound, CT-scan), and scheduling of a procedure (e.g. Endoscopy, Colonoscopy) if required.  Please allow enough time on your schedule to allow for any/all of these possibilities.    If you cannot keep your appointment, please call [phone] to cancel or reschedule prior to your appointment date.  This allows [place] the opportunity to schedule an appointment for another patient in need of care.  If you do not cancel or reschedule by 5 p.m. the business day prior to your appointment date, you will be charged a $50.00 late cancellation/no-show fee.    Thank you for choosing [facility] for your medical needs.  We appreciate the opportunity to care for you.  Please visit [place] at our website  to learn more about our practice.                     Sincerely,                                                             The Gastroenterology Division

## 2010-09-24 ENCOUNTER — Ambulatory Visit (INDEPENDENT_AMBULATORY_CARE_PROVIDER_SITE_OTHER): Payer: BC Managed Care – PPO | Admitting: Internal Medicine

## 2010-09-24 ENCOUNTER — Encounter: Payer: Self-pay | Admitting: Internal Medicine

## 2010-09-24 DIAGNOSIS — E669 Obesity, unspecified: Secondary | ICD-10-CM

## 2010-09-24 MED ORDER — PHENTERMINE HCL 15 MG PO CAPS: 15.0000 mg | ORAL_CAPSULE | ORAL | Status: DC

## 2010-09-24 NOTE — Progress Notes (Signed)
  Subjective:    Patient ID: Katherine Beard, female    DOB: 11/03/1960, 50 y.o.   MRN: [id number]  HPI  PT IS HERE FOR F/U weight loss She is feeling well---has a lot of GI sxs and did have an abnormal sprue panel---to f/u with Dr. Juanda Chance.  In regards to weight---she has lost some weight , exercising daily ("like a fiend"). She is avoiding sweets. Has seen nutritionist.     Review of Systems  patient denies chest pain, shortness of breath, orthopnea. Denies lower extremity edema, abdominal pain, change in appetite, change in bowel movements. Patient denies rashes, musculoskeletal complaints. No other specific complaints in a complete review of systems.      Objective:   Physical Exam  Well-developed well-nourished female in no acute distress. HEENT exam atraumatic, normocephalic, extraocular muscles are intact. Neck is supple. No jugular venous distention no thyromegaly. Chest clear to auscultation without increased work of breathing. Cardiac exam S1 and S2 are regular. Abdominal exam active bowel sounds, soft, nontender. Extremities no edema. Neurologic exam she is alert without any motor sensory deficits. Gait is normal.      Assessment & Plan:

## 2010-09-26 NOTE — Assessment & Plan Note (Signed)
She has done a great job the past month with weight loss She is working hard Advised continued vigorous exercise  Phentermine ok

## 2010-10-06 ENCOUNTER — Encounter: Payer: Self-pay | Admitting: Internal Medicine

## 2010-10-06 ENCOUNTER — Ambulatory Visit (INDEPENDENT_AMBULATORY_CARE_PROVIDER_SITE_OTHER): Payer: BC Managed Care – PPO | Admitting: Internal Medicine

## 2010-10-06 ENCOUNTER — Other Ambulatory Visit (INDEPENDENT_AMBULATORY_CARE_PROVIDER_SITE_OTHER): Payer: BC Managed Care – PPO

## 2010-10-06 DIAGNOSIS — R1013 Epigastric pain: Secondary | ICD-10-CM

## 2010-10-06 DIAGNOSIS — Z8379 Family history of other diseases of the digestive system: Secondary | ICD-10-CM

## 2010-10-06 DIAGNOSIS — K3189 Other diseases of stomach and duodenum: Secondary | ICD-10-CM

## 2010-10-06 DIAGNOSIS — R933 Abnormal findings on diagnostic imaging of other parts of digestive tract: Secondary | ICD-10-CM

## 2010-10-06 LAB — IBC PANEL
Iron: 79 ug/dL (ref 42–145)
Saturation Ratios: 17.5 % — ABNORMAL LOW (ref 20.0–50.0)
Transferrin: 321.7 mg/dL (ref 212.0–360.0)

## 2010-10-06 LAB — IGA: IgA: 89 mg/dL (ref 68–378)

## 2010-10-06 LAB — VITAMIN B12: Vitamin B-12: 730 pg/mL (ref 211–911)

## 2010-10-06 MED ORDER — DICYCLOMINE HCL 10 MG PO CAPS: ORAL_CAPSULE | ORAL | Status: DC

## 2010-10-06 MED ORDER — ALIGN 4 MG PO CAPS: 1.0000 | ORAL_CAPSULE | Freq: Every day | ORAL | Status: DC

## 2010-10-06 NOTE — Patient Instructions (Signed)
You have been scheduled for an abdominal ultrasound on 10/19/10 @ 8 am [facility] (1st floor). Please arrive at 7:45 am for registration. Make certain that you do not have anything to eat or drink 6 hours prior to your test. We have sent you a prescription for Bentyl 10 mg. You should take 1 capsule by mouth twice daily. We have given you samples of Align to take 1 capsule by mouth once daily. If this works well for you, it can be purchased over the counter. Your physician has requested that you go to the basement for the following lab work before leaving today: Endomysial antibodies, IgA, Sprue Panel, TtG, IBC Panel, B12 level.

## 2010-10-06 NOTE — Progress Notes (Signed)
Katherine Beard 08-Jun-1961 MRN [id number]   History of Present Illness:  This is a 50 year old white female with bloating, irregular bowel habits and abnormal antigliadin  IgG antibody, . She had a normal antigliadin IgA  antibody. Her tissue transglutaminase was normal. 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 She also has history of diverticulitis. She is status post sigmoid resection in 2005. Her hemoglobin is 14.1 and her serum albumin 3.6. Her weight has been steadily increasing despite dietary modifications and restrictions. There is a positive family history of gallbladder disease in her mother who had a cholecystectomy at the age 57. An upper abdominal ultrasound in 2006 was negative. She, at that time, presented with right upper quadrant abdominal pain. She has been experiencing rectal spasms and abdominal spasms. A colonoscopy in July 2002 showed moderately severe diverticulosis of the left colon.   Past Medical History  Diagnosis Date  . DIVERTICULITIS, HX OF 12/20/2006  . HYPERGLYCEMIA 02/26/2007  . HYPERLIPIDEMIA 02/20/2007  . OBESITY 08/21/2009  . Palpitations 08/21/2009   Past Surgical History  Procedure Date  . Colectomy 09-16-03    sigmoid    reports that she has never smoked. She has never used smokeless tobacco. She reports that she drinks about .5 ounces of alcohol per week. She reports that she does not use illicit drugs. family history includes Diabetes in her mother; Heart disease in her mother and paternal grandfather; and Hypertension in her mother.  There is no history of Colon cancer. Allergies  Allergen Reactions  . Penicillins     REACTION: urticaria (hives)        Review of Systems: Denies dysphagia or odynophagia. She has had a lot of indigestion, denies rectal bleeding.  The remainder of the 10  point ROS is negative except as outlined in H&P   Physical Exam: General appearance  Well  developed, in no distress. Eyes- non icteric. HEENT nontraumatic, normocephalic. Mouth no lesions, tongue papillated, no cheilosis. Neck supple without adenopathy, thyroid not enlarged, no carotid bruits, no JVD. Lungs Clear to auscultation bilaterally. Cor normal S1 normal S2, regular rhythm , no murmur,  quiet precordium. Abdomen soft abdomen with normal active bowel sounds. Mild tenderness in all quadrants, especially right upper quadrant. Liver edge at costal margin. No CVA tenderness. Rectal: Soft Hemoccult negative stool. Extremities no pedal edema. Skin no lesions. Neurological alert and oriented x 3. Psychological normal mood and affect.  Assessment and Plan:  Problems #1 abnormal labs for celiac disease. There was a positive antigliadin  IgG is not specific for sprue, while IgA level is more specific and hers was negative.. We will recheck endomysial antibodies IgA antibodies for celiac disease. We will also check a total IgA level, B12 and iron studies. If any of these are positive, I would recommend a small bowel biopsy.  Problem #2 dyspepsia and bloating. We need to rule out irritable bowel syndrome. We also need to rule out symptomatic gallbladder disease. We will repeat an upper abdominal ultrasound and will start her on Bentyl 10 mg by mouth twice a day. I have given her samples of a probiotic for the next 2 weeks. We may need to consider adding a PPI depending on the blood test. results.  Problem #3 diverticulosis. She is status post sigmoid resection in 2005. She is doing well from that perspective.   Plan   Upper abdominal ultrasound. Bentyl 10 mg by mouth twice a day.  Sprue panel.as above- additional antibodies B12 and iron studies. Probiotic, one by mouth daily. Consider upper endoscopy with small bowel biopsy depending on results of blood tests. 10/06/2010 Lina Sar

## 2010-10-19 ENCOUNTER — Ambulatory Visit (HOSPITAL_COMMUNITY)
Admission: RE | Admit: 2010-10-19 | Discharge: 2010-10-19 | Disposition: A | Discharge status: Home / Self Care | Payer: BC Managed Care – PPO | Source: Ambulatory Visit | Attending: Internal Medicine | Admitting: Internal Medicine

## 2010-10-19 ENCOUNTER — Telehealth: Payer: Self-pay | Admitting: *Deleted

## 2010-10-19 DIAGNOSIS — K824 Cholesterolosis of gallbladder: Secondary | ICD-10-CM | POA: Insufficient documentation

## 2010-10-19 DIAGNOSIS — R1013 Epigastric pain: Secondary | ICD-10-CM

## 2010-10-19 DIAGNOSIS — K3189 Other diseases of stomach and duodenum: Secondary | ICD-10-CM | POA: Insufficient documentation

## 2010-10-19 DIAGNOSIS — K7689 Other specified diseases of liver: Secondary | ICD-10-CM | POA: Insufficient documentation

## 2010-10-19 DIAGNOSIS — Z8379 Family history of other diseases of the digestive system: Secondary | ICD-10-CM

## 2010-10-19 NOTE — Telephone Encounter (Signed)
Left message for patient to call me

## 2010-10-19 NOTE — Telephone Encounter (Signed)
Message copied by Jesse Fall on Tue Oct 19, 2010 11:01 AM ------      Message from: Lina Sar      Created: Tue Oct 19, 2010 10:37 AM       Please call pt with abd.ultrsound results: gall bladder polyp, not  Causing her Sx's. All labs are OK except slightly low Iron level. Take Iron supplement x 30 days. Please  Ask for her to try Pepcid 40 mg po qd x 30 days  ,#30, 1 refill for "bloating and indigestion". OV 2 months

## 2010-10-20 ENCOUNTER — Telehealth: Payer: Self-pay | Admitting: *Deleted

## 2010-10-20 MED ORDER — FAMOTIDINE 40 MG PO TABS: 40.0000 mg | ORAL_TABLET | Freq: Every day | ORAL | Status: DC

## 2010-10-20 NOTE — Telephone Encounter (Signed)
Message copied by Jesse Fall on Wed Oct 20, 2010  8:53 AM ------      Message from: [place], [place]      Created: Tue Oct 19, 2010 10:37 AM       Please call pt with abd.ultrsound results: gall bladder polyp, not  Causing her Sx's. All labs are OK except slightly low Iron level. Take Iron supplement x 30 days. Please  Ask for her to try Pepcid 40 mg po qd x 30 days  ,#30, 1 refill for "bloating and indigestion". OV 2 months

## 2010-10-20 NOTE — Telephone Encounter (Signed)
I have spoken to Katherine Beard about her blood tests and about her ultrasound. No further eval needed.

## 2010-10-20 NOTE — Telephone Encounter (Signed)
Patient given ultrasound results and lab results. She had many questions about her lab results.  She wants to know if her celiac results are back and wants IgA result explained. Patinet instructed to start iron supplements for 30 days. Rx sent for Pepcid. Patient refused to make OV until she hears about her lab results. Please, advise.

## 2010-10-22 ENCOUNTER — Ambulatory Visit (INDEPENDENT_AMBULATORY_CARE_PROVIDER_SITE_OTHER): Payer: BC Managed Care – PPO | Admitting: Family Medicine

## 2010-10-22 ENCOUNTER — Encounter: Payer: Self-pay | Admitting: Family Medicine

## 2010-10-22 DIAGNOSIS — S0093XA Contusion of unspecified part of head, initial encounter: Secondary | ICD-10-CM

## 2010-10-22 DIAGNOSIS — S060XAA Concussion with loss of consciousness status unknown, initial encounter: Secondary | ICD-10-CM

## 2010-10-22 DIAGNOSIS — S0003XA Contusion of scalp, initial encounter: Secondary | ICD-10-CM

## 2010-10-22 DIAGNOSIS — S060X9A Concussion with loss of consciousness of unspecified duration, initial encounter: Secondary | ICD-10-CM

## 2010-10-22 DIAGNOSIS — S0083XA Contusion of other part of head, initial encounter: Secondary | ICD-10-CM

## 2010-10-22 NOTE — Progress Notes (Signed)
  Subjective:    Patient ID: Katherine Beard, female    DOB: September 19, 1960, 50 y.o.   MRN: [id number]  HPI Here to check for a head injury that occurred at home on 10-15-10 when she stood up quickly and struck her head on an electrical meter on the side of her house. She immediately saw stars and was stunned. She sat down on the ground for a few minutes before she could stand up. She did not lose consciousness, but felt "out of it" for an hour or so. She has had mild bouts of nausea this week, mild dizzy spells, and the top of her head has been sore. No HAs per se, no vision changes, no other neurologic deficits. She took some aspirin immediately after this incident, but has taken nothing since then.    Review of Systems  Constitutional: Negative.   HENT: Negative.   Eyes: Negative.   Respiratory: Negative.   Neurological: Positive for dizziness and light-headedness. Negative for tremors, seizures, syncope, facial asymmetry, speech difficulty, weakness, numbness and headaches.       Objective:   Physical Exam  Constitutional: She is oriented to person, place, and time. She appears well-developed and well-nourished.  HENT:  Right Ear: External ear normal.  Left Ear: External ear normal.  Nose: Nose normal.       There is an ecchymotic area on the left  parietal scalp, this is tender but not swollen. No crepitus.   Eyes: EOM are normal. Pupils are equal, round, and reactive to light.  Neck: Normal range of motion. Neck supple.  Neurological: She is alert and oriented to person, place, and time. She has normal reflexes. She displays normal reflexes. No cranial nerve deficit. She exhibits normal muscle tone. Coordination normal.          Assessment & Plan:  Rest this weekend. Follow up prn

## 2010-10-25 ENCOUNTER — Telehealth: Payer: Self-pay | Admitting: *Deleted

## 2010-10-25 NOTE — Telephone Encounter (Signed)
Pt is having a constant muscle twitching on her left side under her arm and is concerned it is related to her head injury and concussion.  Want Dr. Claris Che advice.  More annoying than anything else, but will not go away.

## 2010-10-25 NOTE — Telephone Encounter (Signed)
Pt is calling again to see what Dr.Fry thinks of her complaints in previous note.

## 2010-10-25 NOTE — Telephone Encounter (Signed)
Make her an OV to see me tomorrow to evaluate this

## 2010-10-26 ENCOUNTER — Encounter: Payer: Self-pay | Admitting: Family Medicine

## 2010-10-26 ENCOUNTER — Ambulatory Visit (INDEPENDENT_AMBULATORY_CARE_PROVIDER_SITE_OTHER): Payer: BC Managed Care – PPO | Admitting: Family Medicine

## 2010-10-26 VITALS — BP 100/72 | HR 54 | Temp 98.3°F | Resp 14 | Wt 181.0 lb

## 2010-10-26 DIAGNOSIS — R259 Unspecified abnormal involuntary movements: Secondary | ICD-10-CM

## 2010-10-26 DIAGNOSIS — R253 Fasciculation: Secondary | ICD-10-CM

## 2010-10-26 NOTE — Progress Notes (Signed)
  Subjective:    Patient ID: Katherine Beard, female    DOB: 09-04-1960, 50 y.o.   MRN: [id number]  HPI Here to follow up after a mild concussion that occurred at home on 10-15-10 when she hit her head on an electric meter. We saw her here 4 days ago and she seemed to be fine. She still has no dizziness or HA today. However she describes a muscle twitch today that has been present for the past 10 days. This is on her left side. It comes about every minute or two, and it lasts about 3-5 seconds at a time. No numbness or pain. No neck or back issues. She has not been exercising at all since her concussion because she was afraid to. She sleeps well at night. Last night she took a Flexeril and slept well. The twitch seemed to be gone for a few hours this morning, but then it came back. She had extensive labs done per Dr. Cato Mulligan in February, and these were all normal except for some slight anemia.    Review of Systems  Constitutional: Negative.   Respiratory: Negative.   Cardiovascular: Negative.   Neurological: Negative.        Objective:   Physical Exam  Constitutional: She is oriented to person, place, and time. She appears well-developed and well-nourished.  HENT:  Head: Normocephalic and atraumatic.  Neurological: She is alert and oriented to person, place, and time. She has normal reflexes. She displays normal reflexes. No cranial nerve deficit. She exhibits normal muscle tone. Coordination normal.       I can see very slight fasciculations on the left side below the axilla, these last about 2 seconds each. No tenderness or numbness          Assessment & Plan:  I reassured her that such fasciculations are quite common and benign. I do not think this is related in any way to her recent head injury. These usually resolve on their own over days or weeks. She can try a muscle relaxer at night before bed if she wishes. She is to see Dr. Cato Mulligan again in 2 weeks, and she will follow up on this  with him then. Encouraged her to get back into an exercise routine.

## 2010-10-26 NOTE — Telephone Encounter (Signed)
Per Pt, she will speak w/her boss and then call back to set appt.

## 2010-10-29 ENCOUNTER — Ambulatory Visit: Payer: BC Managed Care – PPO | Admitting: Internal Medicine

## 2010-11-01 ENCOUNTER — Ambulatory Visit (INDEPENDENT_AMBULATORY_CARE_PROVIDER_SITE_OTHER): Payer: BC Managed Care – PPO | Admitting: Internal Medicine

## 2010-11-01 ENCOUNTER — Encounter: Payer: Self-pay | Admitting: Internal Medicine

## 2010-11-01 VITALS — BP 122/92 | HR 68 | Temp 98.6°F | Wt 177.0 lb

## 2010-11-01 DIAGNOSIS — R002 Palpitations: Secondary | ICD-10-CM

## 2010-11-01 DIAGNOSIS — R253 Fasciculation: Secondary | ICD-10-CM

## 2010-11-01 DIAGNOSIS — R259 Unspecified abnormal involuntary movements: Secondary | ICD-10-CM

## 2010-11-01 NOTE — Progress Notes (Signed)
  Subjective:    Patient ID: Katherine Beard, female    DOB: [birth year], 50 y.o.   MRN: [id number]  HPI  2 weeks ago hit head on electric meter. Reviewed dr fry's note. Shortly after the injury she developed intermittent nausea.  One week later she developed some twitching left side of chest, since then she has had some heart fluttering and an "electric sensation" down left arm. She has not taken any phentermine in > 2.5 weeks.   No SOB,PND, orthopnea.  Past Medical History  Diagnosis Date  . DIVERTICULITIS, HX OF 12/20/2006  . HYPERGLYCEMIA 02/26/2007  . HYPERLIPIDEMIA 02/20/2007  . OBESITY 08/21/2009  . Palpitations 08/21/2009   Past Surgical History  Procedure Date  . Colectomy 09-16-03    sigmoid    reports that she has never smoked. She has never used smokeless tobacco. She reports that she drinks about .5 ounces of alcohol per week. She reports that she does not use illicit drugs. family history includes Diabetes in her mother; Heart disease in her mother and paternal grandfather; and Hypertension in her mother.  There is no history of Colon cancer. Allergies  Allergen Reactions  . Penicillins     REACTION: urticaria (hives)     Review of Systems  patient denies chest pain, shortness of breath, orthopnea. Denies lower extremity edema, abdominal pain, change in appetite, change in bowel movements. Patient denies rashes, musculoskeletal complaints. No other specific complaints in a complete review of systems.      Objective:   Physical Exam  Well-developed well-nourished female in no acute distress. HEENT exam atraumatic, normocephalic, extraocular muscles are intact. Neck is supple. No jugular venous distention no thyromegaly. Chest clear to auscultation without increased work of breathing. Cardiac exam S1 and S2 are regular. Abdominal exam active bowel sounds, soft, nontender. Extremities no edema. Neurologic exam she is alert without any motor sensory deficits. Gait is normal. No  fasciculations noted.       Assessment & Plan:  Symptom complex after trauma to head.  She describes a concussion but those sxs have resolved She now has palpitations and intermittent "twitcing sensation" I'll check EKG

## 2010-11-08 ENCOUNTER — Inpatient Hospital Stay (INDEPENDENT_AMBULATORY_CARE_PROVIDER_SITE_OTHER)
Admission: RE | Admit: 2010-11-08 | Discharge: 2010-11-08 | Disposition: A | Discharge status: Home / Self Care | Payer: BC Managed Care – PPO | Source: Ambulatory Visit | Attending: Family Medicine | Admitting: Family Medicine

## 2010-11-08 DIAGNOSIS — R209 Unspecified disturbances of skin sensation: Secondary | ICD-10-CM

## 2010-11-09 ENCOUNTER — Telehealth: Payer: Self-pay | Admitting: Internal Medicine

## 2010-11-09 DIAGNOSIS — G459 Transient cerebral ischemic attack, unspecified: Secondary | ICD-10-CM

## 2010-11-09 NOTE — Telephone Encounter (Signed)
Ok Mri brain---? TIA

## 2010-11-09 NOTE — Telephone Encounter (Signed)
Notified pt. 

## 2010-11-09 NOTE — Telephone Encounter (Signed)
Pt went to [facility] with tingling and numbness in face and constricting feeling in neck. Urgent care has recommended that pt contact pcp and get an MRI order asap today. Pls call.

## 2010-11-09 NOTE — Telephone Encounter (Signed)
Pt REALLY wants to have the MRI.  She continues to have these weird symptoms almost every day.

## 2010-11-12 ENCOUNTER — Ambulatory Visit: Payer: BC Managed Care – PPO | Admitting: Internal Medicine

## 2010-11-14 ENCOUNTER — Ambulatory Visit
Admission: RE | Admit: 2010-11-14 | Discharge: 2010-11-14 | Disposition: A | Discharge status: Home / Self Care | Payer: BC Managed Care – PPO | Source: Ambulatory Visit | Attending: Internal Medicine | Admitting: Internal Medicine

## 2010-11-14 DIAGNOSIS — G459 Transient cerebral ischemic attack, unspecified: Secondary | ICD-10-CM

## 2010-11-14 MED ORDER — GADOBENATE DIMEGLUMINE 529 MG/ML IV SOLN
15.0000 mL | Freq: Once | INTRAVENOUS | Status: AC | PRN
  Administered 2010-11-14: 15 mL via INTRAVENOUS

## 2010-11-15 ENCOUNTER — Telehealth: Payer: Self-pay | Admitting: *Deleted

## 2010-11-15 ENCOUNTER — Other Ambulatory Visit: Payer: BC Managed Care – PPO

## 2010-11-15 ENCOUNTER — Other Ambulatory Visit: Payer: Self-pay | Admitting: Internal Medicine

## 2010-11-15 DIAGNOSIS — R609 Edema, unspecified: Secondary | ICD-10-CM

## 2010-11-15 NOTE — Telephone Encounter (Signed)
Pt calls concerned about a DVT in her left leg.  Continues with all her previous symptoms, and now has pain behind left knee and some bruising.  Per Dr. Cato Mulligan, order a Ddimer at [facility] today.  Pt notified, and agrees to do this.

## 2010-11-16 ENCOUNTER — Ambulatory Visit (INDEPENDENT_AMBULATORY_CARE_PROVIDER_SITE_OTHER): Payer: BC Managed Care – PPO | Admitting: Internal Medicine

## 2010-11-16 ENCOUNTER — Encounter: Payer: Self-pay | Admitting: Internal Medicine

## 2010-11-16 VITALS — BP 122/84 | HR 64 | Temp 97.9°F

## 2010-11-16 DIAGNOSIS — R002 Palpitations: Secondary | ICD-10-CM | POA: Insufficient documentation

## 2010-11-16 DIAGNOSIS — R202 Paresthesia of skin: Secondary | ICD-10-CM | POA: Insufficient documentation

## 2010-11-16 DIAGNOSIS — R209 Unspecified disturbances of skin sensation: Secondary | ICD-10-CM

## 2010-11-16 LAB — D-DIMER, QUANTITATIVE: D-Dimer, Quant: 0.22 ug/mL-FEU (ref 0.00–0.48)

## 2010-11-16 NOTE — Assessment & Plan Note (Signed)
Patient has an unusual constellation of symptoms. Is difficult to characterize all of her symptoms with one diagnosis. I think the most concrete of her symptoms or palpitations. She does describe at times supraventricular tachycardia. I think this is worth pursuing. I'll check an echocardiogram and Holter monitor.

## 2010-11-16 NOTE — Assessment & Plan Note (Signed)
I'll define her multiple other complaints his paresthesias. She does describe diffuse numbness and tingling sensations. These sensations bother her significantly. No objective findings. I'll have her see neurology. Reviewed her MRI with her period it's completely unremarkable.

## 2010-11-16 NOTE — Progress Notes (Signed)
  Subjective:    Patient ID: Katherine Beard, female    DOB: August 09, 1960, 50 y.o.   MRN: [id number]  HPI  She has recurrent symptoms She woke up last night with heart pounding sensation. She is concerned that she can feel blood pumping. She has had some leg throbbing---happened after a long walk. She is concerned that she may have blood clot---was on BCPs. No chest pain "i know something is not right" She has a written list of complaints: my face gets numb, my right foot is numb now, chest palpitations, "i feel currents run through my body". When I had the mri done the noise bothered me. She has also has had numbness of entire left side of body.  She wonders about magnesium deficiency.  She wonders about perimenopause---she is on hrt The sxs only occur at rest.   Past Medical History  Diagnosis Date  . DIVERTICULITIS, HX OF 12/20/2006  . HYPERGLYCEMIA 02/26/2007  . HYPERLIPIDEMIA 02/20/2007  . OBESITY 08/21/2009  . Palpitations 08/21/2009   Past Surgical History  Procedure Date  . Colectomy 09-16-03    sigmoid    reports that she has never smoked. She has never used smokeless tobacco. She reports that she drinks about .5 ounces of alcohol per week. She reports that she does not use illicit drugs. family history includes Diabetes in her mother; Heart disease in her mother and paternal grandfather; and Hypertension in her mother.  There is no history of Colon cancer. Allergies  Allergen Reactions  . Penicillins     REACTION: urticaria (hives)     Review of Systems  patient denies chest pain, shortness of breath, orthopnea. Denies lower extremity edema, abdominal pain, change in appetite, change in bowel movements. Patient denies rashes, musculoskeletal complaints. No other specific complaints in a complete review of systems.      Objective:   Physical Exam Well-developed female in no acute distress. Affect is normal. HEENT exam atraumatic, normocephalic, neck is supple. Chest is clear  to auscultation with increased work of breathing. Cardiac exam S1-S2 are regular without murmurs or gallop. Peripheral pulses are normal. Abdominal exam active bowel sounds, soft. Neurologic exam she is alert. She has a normal gait.       Assessment & Plan:

## 2010-11-18 ENCOUNTER — Telehealth: Payer: Self-pay | Admitting: *Deleted

## 2010-11-18 ENCOUNTER — Other Ambulatory Visit: Payer: Self-pay | Admitting: Internal Medicine

## 2010-11-18 ENCOUNTER — Ambulatory Visit (HOSPITAL_COMMUNITY)
Admission: RE | Admit: 2010-11-18 | Discharge: 2010-11-18 | Disposition: A | Discharge status: Home / Self Care | Payer: BC Managed Care – PPO | Source: Ambulatory Visit | Attending: Obstetrics and Gynecology | Admitting: Obstetrics and Gynecology

## 2010-11-18 DIAGNOSIS — M79609 Pain in unspecified limb: Secondary | ICD-10-CM

## 2010-11-18 DIAGNOSIS — M7989 Other specified soft tissue disorders: Secondary | ICD-10-CM | POA: Insufficient documentation

## 2010-11-18 DIAGNOSIS — R002 Palpitations: Secondary | ICD-10-CM

## 2010-11-18 DIAGNOSIS — M79605 Pain in left leg: Secondary | ICD-10-CM

## 2010-11-18 NOTE — Telephone Encounter (Signed)
Pt states leg is worse, it has been throbbing with numbness since last night.  Pt requesting an order to have doppler done tomorrow.

## 2010-11-19 ENCOUNTER — Encounter: Payer: BC Managed Care – PPO | Admitting: *Deleted

## 2010-11-19 ENCOUNTER — Telehealth: Payer: Self-pay | Admitting: *Deleted

## 2010-11-19 NOTE — Telephone Encounter (Signed)
Ordered venous doppler.

## 2010-11-19 NOTE — Op Note (Signed)
[name], Beard NO.:  [id number]   MEDICAL RECORD NO.:  [id number]                   PATIENT TYPE:  INP   LOCATION:  0002                                 FACILITY:  [facility]   PHYSICIAN:  Adolph Pollack, M.D.            DATE OF BIRTH:  May 26, 1961   DATE OF PROCEDURE:  09/16/2003  DATE OF DISCHARGE:                                 OPERATIVE REPORT   PREOPERATIVE DIAGNOSIS:  Recurrent sigmoid diverticulitis.   POSTOPERATIVE DIAGNOSIS:  Recurrent sigmoid diverticulitis.   OPERATION/PROCEDURE:  Laparoscopic-assisted sigmoid colectomy.   SURGEON:  Adolph Pollack, M.D.   ASSISTANT:  Abigail Miyamoto, M.D.   ANESTHESIA:  General.   INDICATIONS:  [name] is a 50 year old female who has had multiple  recurrent bouts of sigmoid diverticulitis but treated with oral antibiotics.  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  She now presents for elective partial colectomy.   DESCRIPTION OF PROCEDURE:  She is seen in the holding area and brought to  the operating room, placed supine on the operating table and general  anesthesia was administered.  She was then placed in a lithotomy position  and Foley catheter was placed in the bladder.  Her abdominal wall and  perineum were sterilely prepped and draped.   A small subumbilical incision was made through the skin and subcutaneous  tissue and fascia and into the peritoneal cavity under direct vision.  A  pursestring suture of 0 Vicryl was placed around the fascial edges.  A  Hasson trocar was introduced into the peritoneal cavity and pneumoperitoneum  was created by insufflation of CO2 gas.   Next, a laparoscope was introduced under direct vision.  A 5 mm trocar was  placed in the right lower quadrant and a 10 mm trocar placed through an  epigastric incision.  She was then placed in Trendelenburg and the left side  tilted up.  The sigmoid colon was grasped at the  pelvic brim using the  Harmonic scalpel.  Lateral attachments were divided, mobilizing the sigmoid  colon all the way down to the rectosigmoid junction and below.  I then  proceeded to most superiorly.  Part of the omentum was adherent to the  abdominal wall and I divided the adhesions between the omentum and abdominal  wall toward the splenic flexure.  I then mobilized the descending colon by  dividing its lateral attachments with the Harmonic scalpel and medializing  and using some blunt dissection with the Harmonic scalpel.  At this point I  could identify the extent of the diverticular disease and was able to bring  the proximal bowel inferiorly down to the level of the pubic bone and the  rectum up to that level as well.  The mesentery was fairly mobile.  The left  ureter was identified and not injured.   Next, I made a lower transverse abdominal incision  through the skin and  subcutaneous tissue and fascia.  I then split the muscle and divided the  peritoneum.  I then brought up the sigmoid colon through this extraction  incision.  I divided the colon at descending colon/sigmoid colon junction  and at the rectosigmoid junction.  I then divided the mesentery between  clamps and ligated the mesenteric vessels.  The specimen was handed off the  field.   An end-to-end anastomosis was performed in a single layer using interrupted  3-0 silk sutures inverting the anastomosis.  The anastomosis was patent,  viable, under no tension.   Next, gloves were changed. The abdominal cavity was irrigated out.  Bleeding  points were able to be controlled with cautery.  Needle, sponge and  instrument counts were reported to be correct.  Irrigation fluid was  evacuated.   The peritoneum was then closed with a running 0 Vicryl suture.  The fascia  was closed with a running #1 PDS suture.  The subcutaneous tissue was  irrigated.  The lower transverse incision was closed with staples.   The abdomen  was reinsufflated and the area inspected and was hemostatic.  Incision was __________.  I then released the pneumoperitoneum, removed all  the trocars. The subumbilical fascia defect was closed by tightening up and  tied down the pursestring suture.  The rest of the trocar site skin  incisions were closed with staples.  Sterile dressings were applied.   She tolerated the procedure well without any apparent complications and was  taken to the recovery room in satisfactory condition.                                               Adolph Pollack, M.D.    Kari Baars  D:  09/16/2003  T:  09/16/2003  Job:  [id number]   cc:   Lina Sar, M.D. [facility]   Bruce H. Swords, M.D. [facility]

## 2010-11-19 NOTE — Telephone Encounter (Signed)
Venous doppler---evaluate for dvt

## 2010-11-19 NOTE — Discharge Summary (Signed)
NAME:  Katherine Beard, Katherine Beard                           ACCOUNT NO.:  [id number]   MEDICAL RECORD NO.:  [id number]                   PATIENT TYPE:  INP   LOCATION:  1610                                 FACILITY:  [facility]   PHYSICIAN:  Adolph Pollack, M.D.            DATE OF BIRTH:  Nov 02, 1960   DATE OF ADMISSION:  09/16/2003  DATE OF DISCHARGE:  09/20/2003                                 DISCHARGE SUMMARY   PRIMARY DISCHARGE DIAGNOSIS:  Recurrent sigmoid diverticulitis.   SECONDARY DIAGNOSES:  None.   PROCEDURE:  Laparoscopic sigmoid colectomy.   REASON FOR ADMISSION:  This is a 50 year old female who has had multiple  bouts of recurrent sigmoid diverticulitis.  Elective partial colectomy was  discussed with her, and she is admitted for that.  The procedure and the  risks were discussed with her preoperatively.   HOSPITAL COURSE:  She was an early morning admission and taken to the  operating room for a laparoscopic sigmoid colectomy which she tolerated  well.  Postoperatively, she was having some spasmodic lower abdominal pains.  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  Her incision was clean, although she had some  groin ecchymoses.  On her third postoperative day, she complained of some  discomfort in the right axillary region.  Examination of the arm  demonstrated no swelling and good peripheral pulses, and a fairly normal  motor exam without focal deficits.  By her fourth postoperative day, she was  tolerating her diet.  She wanted to go home.  Was still having some groin  pain, and was discharged.   DISPOSITION:  Discharged to home in satisfactory condition on September 20, 2003.   DISCHARGE MEDICATIONS:  She was given Tylox for pain.   ACTIVITY:  Given activity restrictions.   FOLLOWUP:  She is due to come back and see me in approximately  four to five  days for staple removal.   Her final pathology demonstrated diverticulosis with focal chronic  diverticulitis.                                               Adolph Pollack, M.D.    Kari Baars  D:  10/06/2003  T:  10/07/2003  Job:  [id number]   cc:   Lina Sar, M.D. [facility]   Bruce H. Swords, M.D. [facility]

## 2010-11-19 NOTE — H&P (Signed)
NAMECLOEY, SFERRAZZA NO.:  [id number]   MEDICAL RECORD NO.:  [id number]                   PATIENT TYPE:  INP   LOCATION:  0002                                 FACILITY:  [facility]   PHYSICIAN:  Adolph Pollack, M.D.            DATE OF BIRTH:  [birth year]   DATE OF ADMISSION:  09/16/2003  DATE OF DISCHARGE:                                HISTORY & PHYSICAL   REASON FOR ADMISSION:  Elective partial colectomy.   HISTORY OF PRESENT ILLNESS:  Ms. Dowdle is a 50 year old female now who has  had recurrent bouts of sigmoid diverticulitis.  This has been treated with  multiple courses of antibiotics, as well as anti-spasmodic medications given  by Dr. Juanda Chance.  A barium enema demonstrates significant sigmoid diverticular  disease and a few scattered diverticular areas throughout the rest of the  colon.  I saw her back in November and recommended elective colectomy at  that time which she had planned, but then decided to postpone.  She had one  more bout that had been treated in September.  She is now admitted for  elective partial colectomy.   PAST MEDICAL HISTORY:  Sigmoid diverticulitis.   PAST SURGICAL HISTORY:  Laparoscopy.   ALLERGIES:  PENICILLIN.   MEDICATIONS:  1. Aspirin.  2. Provera.  3. Vitamins.   SOCIAL HISTORY:  No tobacco use.  One to two glasses of wine per week.  She  is married with children and working.   FAMILY HISTORY:  Positive for heart disease and diabetes in her mother.   PHYSICAL EXAMINATION:  GENERAL:  A tall thin female in no acute distress.  Very pleasant and cooperative.  HEENT:  Extraocular movements were intact.  No icterus.  NECK:  Supple without palpable masses or obvious thyroid enlargement.  RESPIRATORY:  The breath sounds are equal and clear.  Respirations  unlabored.  CARDIOVASCULAR:  Heart demonstrates a regular rate and rhythm, no murmur  heard, no JVD.  ABDOMEN:  Soft, small subumbilical scar.  No palpable  masses or organomegaly  or hernias.  EXTREMITIES:  Good muscle tone, full range of motion.   IMPRESSION:  Recurrent sigmoid diverticulitis.   PLAN:  Laparoscopic sigmoid colectomy.  We have discussed the procedure and  the risks at length preoperatively during her office visit.                                               Adolph Pollack, M.D.    Kari Baars  D:  09/16/2003  T:  09/16/2003  Job:  [id number]

## 2010-11-19 NOTE — Telephone Encounter (Signed)
After ordering the venous doppler, pt told Terri that she had a normal test at her GYN yesterday.

## 2010-11-23 ENCOUNTER — Ambulatory Visit (HOSPITAL_COMMUNITY): Payer: BC Managed Care – PPO | Attending: Internal Medicine | Admitting: Radiology

## 2010-11-23 ENCOUNTER — Encounter (INDEPENDENT_AMBULATORY_CARE_PROVIDER_SITE_OTHER): Payer: BC Managed Care – PPO

## 2010-11-23 DIAGNOSIS — R002 Palpitations: Secondary | ICD-10-CM

## 2010-11-23 DIAGNOSIS — I059 Rheumatic mitral valve disease, unspecified: Secondary | ICD-10-CM | POA: Insufficient documentation

## 2010-11-23 DIAGNOSIS — E785 Hyperlipidemia, unspecified: Secondary | ICD-10-CM | POA: Insufficient documentation

## 2010-11-25 ENCOUNTER — Telehealth: Payer: Self-pay | Admitting: *Deleted

## 2010-11-25 DIAGNOSIS — E785 Hyperlipidemia, unspecified: Secondary | ICD-10-CM

## 2010-11-25 DIAGNOSIS — D649 Anemia, unspecified: Secondary | ICD-10-CM

## 2010-11-25 DIAGNOSIS — T887XXA Unspecified adverse effect of drug or medicament, initial encounter: Secondary | ICD-10-CM

## 2010-11-25 NOTE — Telephone Encounter (Signed)
Gave Katherine Beard normal echo results and Dr Juanda Chance told her she has low iron.  Can we check a Vitamin B12 level?

## 2010-11-25 NOTE — Telephone Encounter (Signed)
ok 

## 2010-11-25 NOTE — Telephone Encounter (Signed)
Labs put in, she wanted everything drawn again and wants some xanax called in to [facility]

## 2010-11-26 ENCOUNTER — Other Ambulatory Visit (INDEPENDENT_AMBULATORY_CARE_PROVIDER_SITE_OTHER): Payer: BC Managed Care – PPO | Admitting: Internal Medicine

## 2010-11-26 ENCOUNTER — Other Ambulatory Visit: Payer: Self-pay | Admitting: Internal Medicine

## 2010-11-26 ENCOUNTER — Other Ambulatory Visit: Payer: BC Managed Care – PPO

## 2010-11-26 DIAGNOSIS — E785 Hyperlipidemia, unspecified: Secondary | ICD-10-CM

## 2010-11-26 DIAGNOSIS — D649 Anemia, unspecified: Secondary | ICD-10-CM

## 2010-11-26 DIAGNOSIS — Z13220 Encounter for screening for lipoid disorders: Secondary | ICD-10-CM

## 2010-11-26 DIAGNOSIS — T887XXA Unspecified adverse effect of drug or medicament, initial encounter: Secondary | ICD-10-CM

## 2010-11-26 LAB — VITAMIN B12: Vitamin B-12: 733 pg/mL (ref 211–911)

## 2010-11-26 LAB — BASIC METABOLIC PANEL
BUN: 18 mg/dL (ref 6–23)
CO2: 29 mEq/L (ref 19–32)
Calcium: 9.3 mg/dL (ref 8.4–10.5)
Chloride: 102 mEq/L (ref 96–112)
Creatinine, Ser: 0.9 mg/dL (ref 0.4–1.2)
GFR: 71.19 mL/min (ref >60.00)
Glucose, Bld: 78 mg/dL (ref 70–99)
Potassium: 4.2 mEq/L (ref 3.5–5.1)
Sodium: 137 mEq/L (ref 135–145)

## 2010-11-26 LAB — CBC WITH DIFFERENTIAL/PLATELET
Basophils Absolute: 0.1 10*3/uL (ref 0.0–0.1)
Basophils Relative: 1.1 % (ref 0.0–3.0)
Eosinophils Absolute: 0.1 10*3/uL (ref 0.0–0.7)
Eosinophils Relative: 1.2 % (ref 0.0–5.0)
HCT: 42.6 % (ref 36.0–46.0)
Hemoglobin: 14.9 g/dL (ref 12.0–15.0)
Lymphocytes Relative: 30.9 % (ref 12.0–46.0)
Lymphs Abs: 1.4 10*3/uL (ref 0.7–4.0)
MCHC: 34.9 g/dL (ref 30.0–36.0)
MCV: 91.3 fl (ref 78.0–100.0)
Monocytes Absolute: 0.3 10*3/uL (ref 0.1–1.0)
Monocytes Relative: 7.8 % (ref 3.0–12.0)
Neutro Abs: 2.6 10*3/uL (ref 1.4–7.7)
Neutrophils Relative %: 59 % (ref 43.0–77.0)
Platelets: 231 10*3/uL (ref 150.0–400.0)
RBC: 4.66 Mil/uL (ref 3.87–5.11)
RDW: 12.6 % (ref 11.5–14.6)
WBC: 4.4 10*3/uL — ABNORMAL LOW (ref 4.5–10.5)

## 2010-11-26 LAB — MAGNESIUM: Magnesium: 2.3 mg/dL (ref 1.5–2.5)

## 2010-11-26 LAB — LIPID PANEL
Cholesterol: 241 mg/dL — ABNORMAL HIGH (ref 0–200)
HDL: 76.8 mg/dL (ref >39.00)
Total CHOL/HDL Ratio: 3
Triglycerides: 94 mg/dL (ref 0.0–149.0)
VLDL: 18.8 mg/dL (ref 0.0–40.0)

## 2010-11-26 LAB — HEPATIC FUNCTION PANEL
ALT: 14 U/L (ref 0–35)
AST: 16 U/L (ref 0–37)
Albumin: 3.7 g/dL (ref 3.5–5.2)
Alkaline Phosphatase: 38 U/L — ABNORMAL LOW (ref 39–117)
Bilirubin, Direct: 0.1 mg/dL (ref 0.0–0.3)
Total Bilirubin: 0.5 mg/dL (ref 0.3–1.2)
Total Protein: 6.5 g/dL (ref 6.0–8.3)

## 2010-11-26 LAB — LDL CHOLESTEROL, DIRECT: Direct LDL: 173.7 mg/dL

## 2010-11-26 LAB — TSH: TSH: 1.89 u[IU]/mL (ref 0.35–5.50)

## 2010-12-02 ENCOUNTER — Ambulatory Visit (INDEPENDENT_AMBULATORY_CARE_PROVIDER_SITE_OTHER): Payer: BC Managed Care – PPO | Admitting: Internal Medicine

## 2010-12-02 ENCOUNTER — Encounter: Payer: Self-pay | Admitting: Internal Medicine

## 2010-12-02 DIAGNOSIS — E785 Hyperlipidemia, unspecified: Secondary | ICD-10-CM

## 2010-12-02 DIAGNOSIS — R209 Unspecified disturbances of skin sensation: Secondary | ICD-10-CM

## 2010-12-02 DIAGNOSIS — R202 Paresthesia of skin: Secondary | ICD-10-CM

## 2010-12-02 DIAGNOSIS — R002 Palpitations: Secondary | ICD-10-CM

## 2010-12-02 LAB — FOLLICLE STIMULATING HORMONE: FSH: 12.5 m[IU]/mL

## 2010-12-02 LAB — LUTEINIZING HORMONE: LH: 9.24 m[IU]/mL

## 2010-12-02 MED ORDER — ALPRAZOLAM 0.25 MG PO TABS: 0.2500 mg | ORAL_TABLET | Freq: Every day | ORAL | Status: AC | PRN

## 2010-12-02 NOTE — Assessment & Plan Note (Signed)
No further eval necessary Echo normal

## 2010-12-02 NOTE — Progress Notes (Signed)
  Subjective:    Patient ID: Katherine Beard, female    DOB: 03/29/1961, 50 y.o.   MRN: [id number]  HPI Reviewed previous notes. Reviewed note from neurology---no diagnosis. Pt reports that neurologist has suggested MRI neck (not yet scheduled) No unifying dx has been determined. She has complained of diffuse paesthesias/leg pain, internal tremors. She states that internal tremors continue---some better. She describes this as a trembling sensation. She describes a pulsating sensation in feet, arms. She admits that all of these sxs are "freaking me out".  She has stopped all of there OTC supplements. She states that when she has been out in the sun or with any alcohol (minimal) she has a pulsating sensation.  She continues to have posterior left leg pain---worse AFTER running.   Past Medical History  Diagnosis Date  . DIVERTICULITIS, HX OF 12/20/2006  . HYPERGLYCEMIA 02/26/2007  . HYPERLIPIDEMIA 02/20/2007  . OBESITY 08/21/2009  . Palpitations 08/21/2009   Past Surgical History  Procedure Date  . Colectomy 09-16-03    sigmoid    reports that she has never smoked. She has never used smokeless tobacco. She reports that she drinks about .5 ounces of alcohol per week. She reports that she does not use illicit drugs. family history includes Diabetes in her mother; Heart disease in her mother and paternal grandfather; and Hypertension in her mother.  There is no history of Colon cancer. Allergies  Allergen Reactions  . Penicillins     REACTION: urticaria (hives)    Review of Systems  patient denies chest pain, shortness of breath, orthopnea. Denies lower extremity edema, abdominal pain, change in appetite, change in bowel movements. Patient denies rashes, musculoskeletal complaints. No other specific complaints in a complete review of systems.      Objective:   Physical Exam  Well-developed well-nourished female in no acute distress. HEENT exam atraumatic, normocephalic, extraocular muscles are  intact. Neck is supple. No jugular venous distention no thyromegaly. Chest clear to auscultation without increased work of breathing. Cardiac exam S1 and S2 are regular. Abdominal exam active bowel sounds, soft, nontender. Extremities no edema. Neurologic exam she is alert without any motor sensory deficits. Gait is normal.      Assessment & Plan:

## 2010-12-02 NOTE — Assessment & Plan Note (Signed)
Unclear etiology i will discuss with neurology, i doubt we will come up with a diagnosis Check menopausal hormones

## 2010-12-02 NOTE — Assessment & Plan Note (Signed)
No further eval necessary

## 2010-12-13 ENCOUNTER — Telehealth: Payer: Self-pay | Admitting: *Deleted

## 2010-12-13 NOTE — Telephone Encounter (Signed)
Pt would like to have a copy of labs. Pt aware that a copy is up front.

## 2011-01-14 ENCOUNTER — Telehealth: Payer: Self-pay | Admitting: Internal Medicine

## 2011-01-14 NOTE — Telephone Encounter (Signed)
Patient calling to report she has had many health problems since April. States she has been seeing a neurologist for tingling in feet, muscle spasms, internal tremors. He put her on a beta blocker as needed and ordered an MRI.She looked up the side effects and stopped it. She thinks her problems are related to  Her adrenal glands. Explained to patient that she needs to f/u with her neurologist about this. She insists that she wants Dr. Regino Schultze imput on what she should do. Please, advise

## 2011-01-17 NOTE — Telephone Encounter (Signed)
I still have not had a chance to call her back, sorry, please tell her I will call her soon.

## 2011-01-18 NOTE — Telephone Encounter (Signed)
Patient notified that Dr. Juanda Chance will be calling her soon.

## 2011-01-24 NOTE — Telephone Encounter (Signed)
I have discussed with the pt. 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 She found out on the web site all the symptoms and thinks she has it. I suggested small bowl biopsy as a confirmative test. She will talk to her husband and call me back.

## 2011-02-01 ENCOUNTER — Telehealth: Payer: Self-pay | Admitting: Internal Medicine

## 2011-02-01 NOTE — Telephone Encounter (Signed)
Spoke with patient and she is scheduled in September for OV. She is interested in doing her EGD and Colonoscopy if needed at the same time before Sept. 30th(she has met her deductible and will start over in Oct.) Moved patient appointment to August 13th at 9:00 AM.

## 2011-02-14 ENCOUNTER — Encounter: Payer: Self-pay | Admitting: Internal Medicine

## 2011-02-14 ENCOUNTER — Ambulatory Visit (INDEPENDENT_AMBULATORY_CARE_PROVIDER_SITE_OTHER): Payer: BC Managed Care – PPO | Admitting: Internal Medicine

## 2011-02-14 DIAGNOSIS — R932 Abnormal findings on diagnostic imaging of liver and biliary tract: Secondary | ICD-10-CM

## 2011-02-14 DIAGNOSIS — Z8719 Personal history of other diseases of the digestive system: Secondary | ICD-10-CM

## 2011-02-14 DIAGNOSIS — R933 Abnormal findings on diagnostic imaging of other parts of digestive tract: Secondary | ICD-10-CM

## 2011-02-14 MED ORDER — PEG-KCL-NACL-NASULF-NA ASC-C 100 G PO SOLR: 1.0000 | Freq: Once | ORAL | Status: DC

## 2011-02-14 NOTE — Patient Instructions (Addendum)
You have been scheduled for an endoscopy and colonoscopy. Please follow the written instructions given to you at your visit today. Please pick up your Moviprep at the pharmacy within the next 2-3 days. Dr Cato Mulligan, Dr A. Hawks, Dr Kirtland Bouchard.Anne Hahn

## 2011-02-14 NOTE — Progress Notes (Signed)
Katherine Beard Nov 22, 1960 MRN [id number]     History of Present Illness:  This is a 50 year old old white female with multiple symptoms ranging from tingling in her feet, muscle twitching, diarrhea, constipation and wheat intolerance. Her anti gliadin IgG level was elevated but the rest of the sprue profile was negative. She has a history of diverticulitis necessitating a sigmoid resection at age 38. Her most recent colonoscopy in 2002 showed diverticulosis. She has had a neurological workup by Dr Anne Hahn which has been negative. She has seen a rheumatologist, Dr. Lendon Colonel and has rheumatological tests are all negative. 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 A prior CT scan of the abdomen in June 2008 confirmed the presence of the liver cyst. Her TSH level and FSH were normal. She tried Xanax but didn't like the effect of it.   Past Medical History  Diagnosis Date  . DIVERTICULITIS, HX OF 12/20/2006  . HYPERGLYCEMIA 02/26/2007  . HYPERLIPIDEMIA 02/20/2007  . OBESITY 08/21/2009  . Palpitations 08/21/2009   Past Surgical History  Procedure Date  . Colectomy 09-16-03    sigmoid    reports that she has never smoked. She has never used smokeless tobacco. She reports that she drinks about .5 ounces of alcohol per week. She reports that she does not use illicit drugs. family history includes Diabetes in her mother; Heart disease in her mother and paternal grandfather; and Hypertension in her mother.  There is no history of Colon cancer. Allergies  Allergen Reactions  . Penicillins     REACTION: urticaria (hives)        Review of Systems: Denies heartburn dysphagia or diet aphagia. She has occasional epigastric discomfort. Irregular bowel habits with constipation and diarrhea  The remainder of the 10  point ROS is negative except as outlined in  H&P   Physical Exam: General appearance  Well developed, in no distress. Eyes- non icteric. HEENT nontraumatic, normocephalic. Mouth no lesions, tongue papillated, no cheilosis. Neck supple without adenopathy, thyroid not enlarged, no carotid bruits, no JVD. Lungs Clear to auscultation bilaterally. Cor normal S1 normal S2, regular rhythm , no murmur,  quiet precordium. Abdomen soft abdomen with normal active bowel sounds. No distention. Minimal tenderness in epigastrium. Left and right lower quadrants unremarkable. Rectal: Not done. Extremities no pedal edema. Skin no lesions. Neurological alert and oriented x 3. Psychological normal mood and affect.  Assessment and Plan:  Problem #1 Multisystemic symptoms of unknown etiology. Patient is interested in further evaluation for possible celiac disease. We will proceed with an upper endoscopy and small bowel biopsies. She is also due for a colonoscopy. She has a followup appointment with Dr Lendon Colonel to talk about further rheumatological workup. I think some of her symptoms may be related to stress. I would consider using Paxil or  Zoloft on a trial basis providing all the tests are negative.  History all diverticulitis: Status post sigmoid resection in 2005. Diarrhea and constipation. Due for colonoscopy.  02/14/2011 Lina Sar

## 2011-02-15 ENCOUNTER — Ambulatory Visit (INDEPENDENT_AMBULATORY_CARE_PROVIDER_SITE_OTHER): Payer: BC Managed Care – PPO | Admitting: Sports Medicine

## 2011-02-15 VITALS — BP 129/88 | Ht 66.0 in | Wt 175.0 lb

## 2011-02-15 DIAGNOSIS — G5700 Lesion of sciatic nerve, unspecified lower limb: Secondary | ICD-10-CM | POA: Insufficient documentation

## 2011-02-15 DIAGNOSIS — M5430 Sciatica, unspecified side: Secondary | ICD-10-CM

## 2011-02-15 MED ORDER — AMITRIPTYLINE HCL 25 MG PO TABS: 25.0000 mg | ORAL_TABLET | Freq: Every day | ORAL | Status: DC

## 2011-02-15 NOTE — Patient Instructions (Signed)
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 Start taking amitryptilene for nerve pain at bedtime. Hip exercises and stretching daily. Use a softer/wider cushion on bicycle seat.  Make appointment in one month to follow up.  Piriformis Syndrome Piriformis syndrome is a rare neuromuscular disorder. It happens when the piriformis muscle compresses or irritates the sciatic nerve. This is the largest nerve in the body. The piriformis muscle is a narrow muscle. It is located in the buttocks.  SYMPTOMS Compression of the sciatic nerve causes pain. It is often described as tingling or numbness:  In the buttocks.   Along the nerve.   Down to the leg.  The pain may get worse as a result of:  Sitting for a long period of time.   Climbing stairs.   Walking or running.   TREATMENT Generally, treatment for the disorder begins with stretching exercises and massage. Anti-inflammatory drugs may be prescribed. A patient may be advised to stop running, bicycling, or similar activities. A corticosteroid injection near where the piriformis muscle and the sciatic nerve meet may provide temporary relief. In some cases, surgery is recommended.  The outcome for most individuals with this syndrome is good. Once symptoms of the disorder are addressed, individuals can usually resume their normal activities. In some cases, exercise regimens may need to be modified. This is in order to reduce the likelihood of recurrence or worsening. Document Released: 06/10/2002 Document Re-Released: 12/08/2009 [facility] Patient Information 2011 [place], [place].

## 2011-02-15 NOTE — Assessment & Plan Note (Addendum)
Extensive workup for medical causes of pain has been negative. 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 Will start with hip stretches and abductor strengthening exercises to take pressure off sciatic nerve. Will start low dose amitryptilene for neuropathy. Recommended to use wider bicycle seat to redistribute pyriformis pressure. Follow up in one month.

## 2011-02-15 NOTE — Progress Notes (Signed)
  Subjective:    Patient ID: Katherine Beard, female    DOB: 01-09-1961, 50 y.o.   MRN: [id number]  HPI  1. Paresthesia in Left posterior knee. Sensation of fullness behind knee for past 4 months. Noticed onset shortly after a 13 hour car trip followed by beach vacation with increased walking. Pain has worsened over past few weeks. 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 Has undergone extensive workup by her primary MD, ortho and neurology evaluations including negative arterio-venous dopplers, MRI brain/spine, EMG. 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 Despite this workup, patient is very anxious regarding symptoms.   Of note is that with the extensive workup there has been no medical condition found that is causing the specific symptoms.  She also notes that sitting in meetings in which the stairs are uncomfortable may trigger more symptoms particularly radiating down her left leg.  Review of Systems Bilateral L>R fasciculations in lower legs and positive for bilateral tingling in soles of feet.  Denies weakness, numbness in legs.    Objective:   Physical Exam  Vitals reviewed. Constitutional: She is oriented to person, place, and time. She appears well-developed and well-nourished. No distress.  Musculoskeletal: Normal range of motion. She exhibits no edema and no tenderness.       No masses palpated in knee or popliteal fossa. No tenderness or edema. Full ROM intact bilateral knees. Sensation intact throughout medial and lateral aspects legs. Light touch intact B feet. 5/5 distal ankle strength. Slight briskness to patellar reflex L. No clonus.  4/5 left hip abduction strength vs 5/5 on right.  TTP over left piriformis.   Neurological: She is alert and oriented to person, place, and time. No cranial nerve deficit. She exhibits normal muscle tone. Coordination normal.            Assessment & Plan:

## 2011-02-16 DIAGNOSIS — M5430 Sciatica, unspecified side: Secondary | ICD-10-CM | POA: Insufficient documentation

## 2011-02-16 NOTE — Assessment & Plan Note (Signed)
We will start with amitriptyline and an exercise program.  If her symptoms persist when we see her back in 6 weeks I think she would be a good candidate to try gabapentin  I think her history is very suggestive that this is a sports related injury and not a medical condition.

## 2011-02-21 ENCOUNTER — Ambulatory Visit: Payer: BC Managed Care – PPO | Admitting: Internal Medicine

## 2011-03-10 ENCOUNTER — Ambulatory Visit (AMBULATORY_SURGERY_CENTER): Payer: BC Managed Care – PPO | Admitting: Internal Medicine

## 2011-03-10 ENCOUNTER — Encounter: Payer: Self-pay | Admitting: Internal Medicine

## 2011-03-10 DIAGNOSIS — Z8719 Personal history of other diseases of the digestive system: Secondary | ICD-10-CM

## 2011-03-10 DIAGNOSIS — R933 Abnormal findings on diagnostic imaging of other parts of digestive tract: Secondary | ICD-10-CM

## 2011-03-10 DIAGNOSIS — K2990 Gastroduodenitis, unspecified, without bleeding: Secondary | ICD-10-CM

## 2011-03-10 DIAGNOSIS — K2970 Gastritis, unspecified, without bleeding: Secondary | ICD-10-CM

## 2011-03-10 DIAGNOSIS — D126 Benign neoplasm of colon, unspecified: Secondary | ICD-10-CM

## 2011-03-10 DIAGNOSIS — K319 Disease of stomach and duodenum, unspecified: Secondary | ICD-10-CM

## 2011-03-10 DIAGNOSIS — Z1211 Encounter for screening for malignant neoplasm of colon: Secondary | ICD-10-CM

## 2011-03-10 MED ORDER — SODIUM CHLORIDE 0.9 % IV SOLN: 500.0000 mL | INTRAVENOUS | Status: DC

## 2011-03-11 ENCOUNTER — Telehealth: Payer: Self-pay | Admitting: *Deleted

## 2011-03-11 NOTE — Telephone Encounter (Signed)

## 2011-03-16 ENCOUNTER — Encounter: Payer: Self-pay | Admitting: Internal Medicine

## 2011-03-21 ENCOUNTER — Ambulatory Visit: Payer: BC Managed Care – PPO | Admitting: Sports Medicine

## 2011-03-25 ENCOUNTER — Telehealth: Payer: Self-pay | Admitting: Internal Medicine

## 2011-03-25 ENCOUNTER — Encounter: Payer: Self-pay | Admitting: Internal Medicine

## 2011-03-25 NOTE — Telephone Encounter (Signed)
I am sorry about  Pt not getting her letter from EGD, only from the colonoscopy. We are just finding out that in generating letters from double procedures, only one letter has been sent. I remember sending both letters but the EGD letter was never printed. We are working on correcting it. Her EGD- small bowl biopsies are normal there is no evidence of celiac disease. I apologize. New letter is in the mail

## 2011-03-25 NOTE — Telephone Encounter (Signed)
Please call pt  The letter is in the mail. Unfortunately we are finding out that of 2 procedures, only one letter has been sent, I have generated the EGD letter again and printed it so we can mail it to her. All EGD biopsies were normal

## 2011-03-25 NOTE — Telephone Encounter (Signed)
Spoke with patient and she has not heard about any results from EGD. Also, wants Dr. Juanda Chance to know she is still having the "internal trembling." She is wondering if she should see an endocrinologist if the EGD is negative. Please, advise.

## 2011-03-28 NOTE — Telephone Encounter (Signed)
I have spoken to the pt extensively. The same symptoms, shakiness, wakes her up early in the morning. Tried Elavil 25 mg, "it did not work". Wants a referral to Endocrinologist. I told her that she has already seen several specialist. She will try to see Dr Talmage Nap.

## 2011-03-28 NOTE — Telephone Encounter (Signed)
Patient wants to know if Dr. Juanda Chance with refer her to endocrinologist. She thinks her problems may be hormonal. Please, advise.

## 2011-03-31 ENCOUNTER — Ambulatory Visit: Payer: BC Managed Care – PPO | Admitting: Sports Medicine

## 2011-04-28 ENCOUNTER — Telehealth: Payer: Self-pay | Admitting: Internal Medicine

## 2011-04-28 NOTE — Telephone Encounter (Signed)
Patient states she had the colonoscopy only because if was due and she was having the EGD. She thinks the coding was incorrect on her procedure. Dr. Juanda Chance was it a screening colon?

## 2011-04-28 NOTE — Telephone Encounter (Signed)
She is right, it was V76.51, screening. The other symptoms just review her history. So the main code is V76.51. Should we call somebody about it? Let me know.DB

## 2011-05-03 NOTE — Telephone Encounter (Signed)
Sent corrected info to Charge Correction with the primary dx code V76.51 for the COL procedure

## 2011-07-04 ENCOUNTER — Telehealth: Payer: Self-pay | Admitting: Internal Medicine

## 2011-07-04 NOTE — Telephone Encounter (Signed)
Per previous note, [facility] sent charge correction. Pt may be confusing EGD charge, which will still only be covered at 70%.   Can you have Britta Mccreedy (coder) verify that claim can be resubmitted with V76.51 as primary dx?

## 2011-07-07 ENCOUNTER — Ambulatory Visit (INDEPENDENT_AMBULATORY_CARE_PROVIDER_SITE_OTHER): Payer: BC Managed Care – PPO | Admitting: *Deleted

## 2011-07-07 DIAGNOSIS — Z23 Encounter for immunization: Secondary | ICD-10-CM

## 2011-07-12 NOTE — Telephone Encounter (Signed)
07/12/11 called patient and informed her that I had sent in a charge correction to [employer].  Asked that she call me if she continues to get a bill.

## 2011-08-01 ENCOUNTER — Other Ambulatory Visit: Payer: Self-pay | Admitting: Dermatology

## 2012-07-04 DIAGNOSIS — C439 Malignant melanoma of skin, unspecified: Secondary | ICD-10-CM

## 2012-07-04 DIAGNOSIS — K7689 Other specified diseases of liver: Secondary | ICD-10-CM

## 2012-07-04 HISTORY — DX: Malignant melanoma of skin, unspecified: C43.9

## 2012-07-04 HISTORY — PX: COLONOSCOPY: SHX174

## 2012-07-04 HISTORY — DX: Other specified diseases of liver: K76.89

## 2012-08-01 ENCOUNTER — Other Ambulatory Visit: Payer: Self-pay | Admitting: Dermatology

## 2012-08-20 ENCOUNTER — Other Ambulatory Visit: Payer: Self-pay | Admitting: Dermatology

## 2012-10-02 ENCOUNTER — Telehealth: Payer: Self-pay | Admitting: Internal Medicine

## 2012-10-02 ENCOUNTER — Ambulatory Visit: Payer: BC Managed Care – PPO | Admitting: Gastroenterology

## 2012-10-02 DIAGNOSIS — R1011 Right upper quadrant pain: Secondary | ICD-10-CM

## 2012-10-02 NOTE — Telephone Encounter (Signed)
She  ought to have another ultrasound of upper abdomen, for follow up of gall bladder polyp and a medium size renal cyst, before she sees me, because both conditions could be causing her pain. ?Did she try Miralax for possible constipation?

## 2012-10-02 NOTE — Telephone Encounter (Signed)
Left a message for patient to call me. 

## 2012-10-02 NOTE — Telephone Encounter (Signed)
Patient calling report for the last month she has had a pain under her right rib cage area. States it is worse at night and when she sits for long periods. States it is more like an ache. Does not burn. She denies any muscle strains. She does not feel it is a muscular issue. Offered OV with extender but patient wants to see Dr. Juanda Chance. Scheduled on 10/12/12 at 3:00 PM.

## 2012-10-03 ENCOUNTER — Encounter: Payer: Self-pay | Admitting: *Deleted

## 2012-10-03 NOTE — Telephone Encounter (Signed)
Scheduled US abdomen on 10/04/12 at 8:30 AM at [facility] radiology([facility]enn[facility]). NPO after midnight. Spoke with patient and gave her appointment date, time and instructions. Patient states she is not having constipation at all. She reports loose stools.

## 2012-10-04 ENCOUNTER — Ambulatory Visit (HOSPITAL_COMMUNITY)
Admission: RE | Admit: 2012-10-04 | Discharge: 2012-10-04 | Disposition: A | Discharge status: Home / Self Care | Payer: BC Managed Care – PPO | Source: Ambulatory Visit | Attending: Internal Medicine | Admitting: Internal Medicine

## 2012-10-04 DIAGNOSIS — R109 Unspecified abdominal pain: Secondary | ICD-10-CM | POA: Insufficient documentation

## 2012-10-04 DIAGNOSIS — R1011 Right upper quadrant pain: Secondary | ICD-10-CM

## 2012-10-04 DIAGNOSIS — K7689 Other specified diseases of liver: Secondary | ICD-10-CM | POA: Insufficient documentation

## 2012-10-04 DIAGNOSIS — K824 Cholesterolosis of gallbladder: Secondary | ICD-10-CM | POA: Insufficient documentation

## 2012-10-04 DIAGNOSIS — Q619 Cystic kidney disease, unspecified: Secondary | ICD-10-CM | POA: Insufficient documentation

## 2012-10-09 ENCOUNTER — Encounter: Payer: Self-pay | Admitting: *Deleted

## 2012-10-12 ENCOUNTER — Ambulatory Visit: Payer: BC Managed Care – PPO | Admitting: Internal Medicine

## 2012-11-12 ENCOUNTER — Other Ambulatory Visit: Payer: Self-pay | Admitting: Dermatology

## 2013-07-04 DIAGNOSIS — K7689 Other specified diseases of liver: Secondary | ICD-10-CM

## 2013-07-04 HISTORY — DX: Other specified diseases of liver: K76.89

## 2013-08-26 ENCOUNTER — Ambulatory Visit: Payer: BC Managed Care – PPO | Admitting: Internal Medicine

## 2013-12-25 ENCOUNTER — Other Ambulatory Visit: Payer: Self-pay | Admitting: Family Medicine

## 2013-12-25 DIAGNOSIS — R1084 Generalized abdominal pain: Secondary | ICD-10-CM

## 2013-12-27 ENCOUNTER — Encounter (INDEPENDENT_AMBULATORY_CARE_PROVIDER_SITE_OTHER): Payer: Self-pay

## 2013-12-27 ENCOUNTER — Ambulatory Visit
Admission: RE | Admit: 2013-12-27 | Discharge: 2013-12-27 | Disposition: A | Discharge status: Home / Self Care | Payer: BC Managed Care – PPO | Source: Ambulatory Visit | Attending: Family Medicine | Admitting: Family Medicine

## 2013-12-27 DIAGNOSIS — R1084 Generalized abdominal pain: Secondary | ICD-10-CM

## 2014-01-06 ENCOUNTER — Other Ambulatory Visit: Payer: Self-pay | Admitting: Family Medicine

## 2014-01-06 DIAGNOSIS — R935 Abnormal findings on diagnostic imaging of other abdominal regions, including retroperitoneum: Secondary | ICD-10-CM

## 2014-01-06 DIAGNOSIS — R1011 Right upper quadrant pain: Secondary | ICD-10-CM

## 2014-01-08 ENCOUNTER — Telehealth: Payer: Self-pay | Admitting: Internal Medicine

## 2014-01-08 NOTE — Telephone Encounter (Signed)
Abd. Ultrasound is suspicious for pancreatic mass, it was not present on sono 2012. She needs to go ahead with MRCP as ordered by Dr Ernie Hew.

## 2014-01-08 NOTE — Telephone Encounter (Signed)
Patient recently has [place] of abdomen by PCP because of abdominal pain. They recommend she have an MRI. She wants to know what Dr. Olevia Perches thinks. She does not want to have an expensive test if she does not need it. Please, advise.

## 2014-01-09 NOTE — Telephone Encounter (Signed)
Patient notified of recommendations. 

## 2014-01-11 ENCOUNTER — Ambulatory Visit
Admission: RE | Admit: 2014-01-11 | Discharge: 2014-01-11 | Disposition: A | Discharge status: Home / Self Care | Payer: BC Managed Care – PPO | Source: Ambulatory Visit | Attending: Family Medicine | Admitting: Family Medicine

## 2014-01-11 DIAGNOSIS — R935 Abnormal findings on diagnostic imaging of other abdominal regions, including retroperitoneum: Secondary | ICD-10-CM

## 2014-01-11 DIAGNOSIS — R1011 Right upper quadrant pain: Secondary | ICD-10-CM

## 2014-01-11 MED ORDER — GADOBENATE DIMEGLUMINE 529 MG/ML IV SOLN
15.0000 mL | Freq: Once | INTRAVENOUS | Status: AC | PRN
  Administered 2014-01-11: 15 mL via INTRAVENOUS

## 2014-03-13 ENCOUNTER — Encounter: Payer: Self-pay | Admitting: Internal Medicine

## 2014-09-16 ENCOUNTER — Other Ambulatory Visit: Payer: Self-pay | Admitting: Dermatology

## 2015-01-13 ENCOUNTER — Other Ambulatory Visit: Payer: Self-pay | Admitting: Obstetrics and Gynecology

## 2015-01-14 LAB — CYTOLOGY - PAP

## 2015-08-28 ENCOUNTER — Other Ambulatory Visit: Payer: Self-pay | Admitting: Family Medicine

## 2015-08-28 ENCOUNTER — Ambulatory Visit
Admission: RE | Admit: 2015-08-28 | Discharge: 2015-08-28 | Disposition: A | Discharge status: Home / Self Care | Payer: 59 | Source: Ambulatory Visit | Attending: Family Medicine | Admitting: Family Medicine

## 2015-08-28 DIAGNOSIS — R109 Unspecified abdominal pain: Secondary | ICD-10-CM

## 2015-09-02 ENCOUNTER — Telehealth: Payer: Self-pay | Admitting: Gastroenterology

## 2015-09-03 NOTE — Telephone Encounter (Signed)
Called patient left message.... No report from Dr Ernie Hew found  Looked in [place] and in Dr Jillyn Hidden office. Could not locate a report from patient from Dr Ernie Hew

## 2015-10-28 ENCOUNTER — Ambulatory Visit: Payer: Self-pay | Admitting: Gastroenterology

## 2015-12-16 ENCOUNTER — Ambulatory Visit: Payer: Self-pay | Admitting: Gastroenterology

## 2016-03-10 ENCOUNTER — Encounter: Payer: Self-pay | Admitting: Gastroenterology

## 2016-03-10 ENCOUNTER — Ambulatory Visit (INDEPENDENT_AMBULATORY_CARE_PROVIDER_SITE_OTHER): Payer: 59 | Admitting: Gastroenterology

## 2016-03-10 ENCOUNTER — Encounter (INDEPENDENT_AMBULATORY_CARE_PROVIDER_SITE_OTHER): Payer: Self-pay

## 2016-03-10 VITALS — BP 110/78 | HR 68 | Ht 65.0 in | Wt 185.2 lb

## 2016-03-10 DIAGNOSIS — M25511 Pain in right shoulder: Secondary | ICD-10-CM

## 2016-03-10 DIAGNOSIS — K7689 Other specified diseases of liver: Secondary | ICD-10-CM

## 2016-03-10 DIAGNOSIS — R1011 Right upper quadrant pain: Secondary | ICD-10-CM | POA: Diagnosis not present

## 2016-03-10 NOTE — Progress Notes (Signed)
Katherine Beard    [id number]    [birth year]  Primary Care 62, MD  Referring Physician: Lisabeth Pick, MD [place], [place] [address]  Chief complaint:  RUQ abd pain  HPI: 55 yr F previously followed by Dr Olevia Perches, last seen in 2012 is here to establish care. Patient c/o intermittent right upper quadrant abdominal pain, is worse in the past few months. She has a known liver cyst ~8cm. Denies any nausea, vomiting, melena or bright red blood per rectum. She c/o intermittent R should pain radiating to the back, mostly bothers her shoulder blade.     Outpatient Encounter Prescriptions as of 03/10/2016  Medication Sig  . JUNEL FE 1/20 1-20 MG-MCG tablet   . amitriptyline (ELAVIL) 25 MG tablet Take 1 tablet (25 mg total) by mouth at bedtime.   No facility-administered encounter medications on file as of 03/10/2016.     Allergies as of 03/10/2016 - Review Complete 03/10/2016  Allergen Reaction Noted  . Penicillins  12/20/2006    Past Medical History:  Diagnosis Date  . Acne   . DIVERTICULITIS, HX OF   . Epicondylitis syndrome of elbow    RIGHT   . GERD (gastroesophageal reflux disease)   . Hepatic cyst 2015   per MRI  . HYPERGLYCEMIA   . HYPERLIPIDEMIA   . Hyperplastic colon polyp   . Iliotibial band syndrome of right side   . Liver cyst 2014  . Melanoma ([place]) 2014   Excised -clear margins  . OBESITY   . Palpitations   . Vitamin D deficiency     Past Surgical History:  Procedure Laterality Date  . COLECTOMY  09-16-03   sigmoid  . COLONOSCOPY  2014  . UMBILICAL LAPRPOSCOPY     AGE 64    Family History  Problem Relation Age of Onset  . Diabetes Mother   . Hypertension Mother   . Heart disease Mother     pacemaker  . Heart disease Paternal Grandfather   . Breast cancer Maternal Aunt   . Colon cancer Neg Hx     Social History   Social History  . Marital status: Married    Spouse name: N/A  . Number of  children: 2  . Years of education: N/A   Occupational History  . radio advertising    Social History Main Topics  . Smoking status: Never Smoker  . Smokeless tobacco: Never Used  . Alcohol use 0.5 oz/week    1 drink(s) per week     Comment: wine per week  . Drug use: No  . Sexual activity: Not on file   Other Topics Concern  . Not on file   Social History Narrative  . No narrative on file      Review of systems: Review of Systems  Constitutional: Negative for fever and chills.  HENT: Negative.   Eyes: Negative for blurred vision.  Respiratory: Negative for cough, shortness of breath and wheezing.   Cardiovascular: Negative for chest pain and palpitations.  Gastrointestinal: as per HPI Genitourinary: Negative for dysuria, urgency, frequency and hematuria.  Musculoskeletal: Negative for myalgias, back pain and joint pain.  Skin: Negative for itching and rash.  Neurological: Negative for dizziness, tremors, focal weakness, seizures and loss of consciousness.  Endo/Heme/Allergies: Positive for seasonal allergies.  Psychiatric/Behavioral: Negative for depression, suicidal ideas and hallucinations.  All other systems reviewed and are negative.   Physical Exam: Vitals:  03/10/16 0845  BP: 110/78  Pulse: 68   Body mass index is 30.83 kg/m. Gen:      No acute distress HEENT:  EOMI, sclera anicteric Neck:     No masses; no thyromegaly Lungs:    Clear to auscultation bilaterally; normal respiratory effort CV:         Regular rate and rhythm; no murmurs Abd:      + bowel sounds; soft, non-tender; no palpable masses, no distension Ext:    No edema; adequate peripheral perfusion Skin:      Warm and dry; no rash Neuro: alert and oriented x 3 Psych: normal mood and affect  Data Reviewed:  Reviewed labs, radiologic imaging, old records and pertinent past GI work up I personally reviewed the images   Assessment and Plan/Recommendations:  25 yr F with h/o  diverticulitis s/p sigmoid resection, benign liver cyst ~8cm based on last imaging MRI abdomen in July 2015 here with c/o intermittent RUQ pain and also R shoulder blade pain Will obtain abdominal ultrasound for surveillance, if remains stable will not need any further surveillance Last colonoscopy in 2012, was normal, due for screening in 2022 Will refer to Dr Tamala Julian for evaluation of R shoulder pain, like related to poor posture/musculoskeltal in nature   Greater than 50% of the time used for counseling as well as treatment plan and follow-up. She had multiple questions which were answered to her satisfaction  K. Denzil Magnuson , MD [phone] Mon-Fri 8a-5p [phone] after 5p, weekends, holidays  CC: Swords, Darrick Penna, MD

## 2016-03-10 NOTE — Patient Instructions (Addendum)
You have been scheduled for an abdominal ultrasound at [facility] (1st floor of hospital) on 03/14/2016 at 11am. Please arrive 15 minutes prior to your appointment for registration. Make certain not to have anything to eat or drink 6 hours prior to your appointment. Should you need to reschedule your appointment, please contact radiology at [phone]. This test typically takes about 30 minutes to perform.  Referral to Hulan Saas, MD is scheduled on 03/29/2016 at 2:30pm if you to to reschedule this appointment contact them at [phone]

## 2016-03-14 ENCOUNTER — Ambulatory Visit (HOSPITAL_COMMUNITY): Payer: 59

## 2016-03-15 ENCOUNTER — Ambulatory Visit (HOSPITAL_COMMUNITY)
Admission: RE | Admit: 2016-03-15 | Discharge: 2016-03-15 | Disposition: A | Discharge status: Home / Self Care | Payer: 59 | Source: Ambulatory Visit | Attending: Gastroenterology | Admitting: Gastroenterology

## 2016-03-15 DIAGNOSIS — K7689 Other specified diseases of liver: Secondary | ICD-10-CM | POA: Diagnosis not present

## 2016-03-15 DIAGNOSIS — R1011 Right upper quadrant pain: Secondary | ICD-10-CM | POA: Diagnosis present

## 2016-03-29 ENCOUNTER — Ambulatory Visit: Payer: 59 | Admitting: Family Medicine

## 2016-11-09 ENCOUNTER — Ambulatory Visit: Payer: Self-pay

## 2018-04-18 DIAGNOSIS — D225 Melanocytic nevi of trunk: Secondary | ICD-10-CM | POA: Diagnosis not present

## 2018-04-18 DIAGNOSIS — Z85820 Personal history of malignant melanoma of skin: Secondary | ICD-10-CM | POA: Diagnosis not present

## 2018-04-18 DIAGNOSIS — L821 Other seborrheic keratosis: Secondary | ICD-10-CM | POA: Diagnosis not present

## 2018-04-18 DIAGNOSIS — L570 Actinic keratosis: Secondary | ICD-10-CM | POA: Diagnosis not present

## 2018-04-18 DIAGNOSIS — Z23 Encounter for immunization: Secondary | ICD-10-CM | POA: Diagnosis not present

## 2018-05-17 DIAGNOSIS — Z634 Disappearance and death of family member: Secondary | ICD-10-CM | POA: Diagnosis not present

## 2018-05-17 DIAGNOSIS — E7800 Pure hypercholesterolemia, unspecified: Secondary | ICD-10-CM | POA: Diagnosis not present

## 2019-04-08 ENCOUNTER — Other Ambulatory Visit: Payer: Self-pay

## 2019-04-08 DIAGNOSIS — Z20822 Contact with and (suspected) exposure to covid-19: Secondary | ICD-10-CM

## 2019-04-10 LAB — NOVEL CORONAVIRUS, NAA: SARS-CoV-2, NAA: NOT DETECTED

## 2019-04-18 ENCOUNTER — Other Ambulatory Visit: Payer: Self-pay

## 2019-04-18 DIAGNOSIS — Z20822 Contact with and (suspected) exposure to covid-19: Secondary | ICD-10-CM

## 2019-04-20 LAB — NOVEL CORONAVIRUS, NAA: SARS-CoV-2, NAA: NOT DETECTED

## 2019-07-10 ENCOUNTER — Ambulatory Visit: Payer: 59 | Attending: Internal Medicine

## 2019-07-10 DIAGNOSIS — Z20822 Contact with and (suspected) exposure to covid-19: Secondary | ICD-10-CM

## 2019-07-11 LAB — NOVEL CORONAVIRUS, NAA: SARS-CoV-2, NAA: NOT DETECTED

## 2019-07-18 ENCOUNTER — Other Ambulatory Visit: Payer: 59

## 2019-07-19 ENCOUNTER — Ambulatory Visit: Payer: 59 | Attending: Internal Medicine

## 2019-07-19 DIAGNOSIS — Z20822 Contact with and (suspected) exposure to covid-19: Secondary | ICD-10-CM

## 2019-07-20 LAB — NOVEL CORONAVIRUS, NAA: SARS-CoV-2, NAA: DETECTED — AB

## 2019-08-07 ENCOUNTER — Encounter: Payer: Self-pay | Admitting: Gastroenterology

## 2019-08-07 ENCOUNTER — Ambulatory Visit (INDEPENDENT_AMBULATORY_CARE_PROVIDER_SITE_OTHER): Payer: 59 | Admitting: Gastroenterology

## 2019-08-07 ENCOUNTER — Other Ambulatory Visit (INDEPENDENT_AMBULATORY_CARE_PROVIDER_SITE_OTHER): Payer: 59

## 2019-08-07 VITALS — BP 108/64 | HR 70 | Temp 98.1°F | Ht 66.0 in | Wt 165.0 lb

## 2019-08-07 DIAGNOSIS — K625 Hemorrhage of anus and rectum: Secondary | ICD-10-CM

## 2019-08-07 DIAGNOSIS — Z8719 Personal history of other diseases of the digestive system: Secondary | ICD-10-CM

## 2019-08-07 LAB — CBC WITH DIFFERENTIAL/PLATELET
Basophils Absolute: 0 10*3/uL (ref 0.0–0.1)
Basophils Relative: 0.9 % (ref 0.0–3.0)
Eosinophils Absolute: 0 10*3/uL (ref 0.0–0.7)
Eosinophils Relative: 0.8 % (ref 0.0–5.0)
HCT: 39.5 % (ref 36.0–46.0)
Hemoglobin: 13.4 g/dL (ref 12.0–15.0)
Lymphocytes Relative: 33.6 % (ref 12.0–46.0)
Lymphs Abs: 1.6 10*3/uL (ref 0.7–4.0)
MCHC: 33.9 g/dL (ref 30.0–36.0)
MCV: 94.5 fl (ref 78.0–100.0)
Monocytes Absolute: 0.4 10*3/uL (ref 0.1–1.0)
Monocytes Relative: 7.8 % (ref 3.0–12.0)
Neutro Abs: 2.7 10*3/uL (ref 1.4–7.7)
Neutrophils Relative %: 56.9 % (ref 43.0–77.0)
Platelets: 259 10*3/uL (ref 150.0–400.0)
RBC: 4.18 Mil/uL (ref 3.87–5.11)
RDW: 13.4 % (ref 11.5–15.5)
WBC: 4.8 10*3/uL (ref 4.0–10.5)

## 2019-08-07 MED ORDER — NA SULFATE-K SULFATE-MG SULF 17.5-3.13-1.6 GM/177ML PO SOLN: ORAL | 0 refills | Status: DC

## 2019-08-07 NOTE — Progress Notes (Signed)
Katherine Beard    [id number]    1960/11/04  Primary Care Physician:Dewey, Mechele Claude, MD  Referring Physician: Fanny Bien, MD [address] [place],  [place] [address]   Chief complaint:  Blood in stool  HPI: 59 year old female here for follow-up visit for complaints of rectal bleeding.  She had an episode of rectal bleeding after she came back from a 3 mile run earlier this week.  She runs on average 3 miles 3-4 times a week.  She has been having intermittent bright red blood per rectum mixed in with mucus and sometimes stool for the past 1 to 2 months She had an episode of acute diverticulitis  December 2020, was treated by antibiotics which resolved abdominal pain.  She had a recurrent episode last month, she was able to manage the symptoms with dietary changes.  Covid 19 + 2 weeks ago.  She had minimal symptoms, was tested for her work prior to travel.  She quarantine for 10 days and is currently asymptomatic.  Colonoscopy 03/10/2011: Sigmoid polyp, diverticulosis  US abdomen 03/2016: no gallstones or duct dilation, liver lobe cyst 8cm   Outpatient Encounter Medications as of 08/07/2019  Medication Sig  . amitriptyline (ELAVIL) 25 MG tablet Take 1 tablet (25 mg total) by mouth at bedtime.  [name]    No facility-administered encounter medications on file as of 08/07/2019.    Allergies as of 08/07/2019 - Review Complete 03/10/2016  Allergen Reaction Noted  . Penicillins  12/20/2006    Past Medical History:  Diagnosis Date  . Acne   . DIVERTICULITIS, HX OF   . Epicondylitis syndrome of elbow    RIGHT   . GERD (gastroesophageal reflux disease)   . Hepatic cyst 2015   per MRI  . HYPERGLYCEMIA   . HYPERLIPIDEMIA   . Hyperplastic colon polyp   . Iliotibial band syndrome of right side   . Liver cyst 2014  . Melanoma ([place]) 2014   Excised -clear margins  . OBESITY   . Palpitations   . Vitamin D deficiency     Past  Surgical History:  Procedure Laterality Date  . COLECTOMY  09-16-03   sigmoid  . COLONOSCOPY  2014  . UMBILICAL LAPRPOSCOPY     AGE 68    Family History  Problem Relation Age of Onset  . Diabetes Mother   . Hypertension Mother   . Heart disease Mother        pacemaker  . Heart disease Paternal Grandfather   . Breast cancer Maternal Aunt   . Colon cancer Neg Hx     Social History   Socioeconomic History  . Marital status: Married    Spouse name: Not on file  . Number of children: 2  . Years of education: Not on file  . Highest education level: Not on file  Occupational History  . Occupation: radio advertising  Tobacco Use  . Smoking status: Never Smoker  . Smokeless tobacco: Never Used  Substance and Sexual Activity  . Alcohol use: Yes    Alcohol/week: 1.0 standard drinks    Types: 1 drink(s) per week    Comment: wine per week  . Drug use: No  . Sexual activity: Not on file  Other Topics Concern  . Not on file  Social History Narrative  . Not on file   Social Determinants of Health   Financial Resource  Strain:   . Difficulty of Paying Living Expenses: Not on file  Food Insecurity:   . Worried About Charity fundraiser in the Last Year: Not on file  . Ran Out of Food in the Last Year: Not on file  Transportation Needs:   . Lack of Transportation (Medical): Not on file  . Lack of Transportation (Non-Medical): Not on file  Physical Activity:   . Days of Exercise per Week: Not on file  . Minutes of Exercise per Session: Not on file  Stress:   . Feeling of Stress : Not on file  Social Connections:   . Frequency of Communication with Friends and Family: Not on file  . Frequency of Social Gatherings with Friends and Family: Not on file  . Attends Religious Services: Not on file  . Active Member of Clubs or Organizations: Not on file  . Attends Archivist Meetings: Not on file  . Marital Status: Not on file  Intimate Partner Violence:   . Fear of  Current or Ex-Partner: Not on file  . Emotionally Abused: Not on file  . Physically Abused: Not on file  . Sexually Abused: Not on file      Review of systems: All other review of systems negative except as mentioned in the HPI.   Physical Exam: Vitals:   08/07/19 0851  BP: 108/64  Pulse: 70  Temp: 98.1 F (36.7 C)   Body mass index is 26.63 kg/m. Gen:      No acute distress HEENT:  EOMI, sclera anicteric Abd:      + bowel sounds; soft, non-tender; no palpable masses, no distension Ext:    No edema; adequate peripheral perfusion Skin:      Warm and dry; no rash Neuro: alert and oriented x 3 Psych: normal mood and affect Rectal exam deferred  Data Reviewed:  Reviewed labs, radiology imaging, old records and pertinent past GI work up   Assessment and Plan/Recommendations:  59 year old female with history of diverticulitis s/p segmental colon resection with recurrent diverticulitis with complaints of rectal bleeding Scheduled for colonoscopy for further evaluation Check CBC  Start Benefiber 1 teaspoon twice daily  Return after colonoscopy  The risks and benefits as well as alternatives of endoscopic procedure(s) have been discussed and reviewed. All questions answered. The patient agrees to proceed.   The patient was provided an opportunity to ask questions and all were answered. The patient agreed with the plan and demonstrated an understanding of the instructions.  Damaris Hippo , MD    CC: Fanny Bien, MD

## 2019-08-07 NOTE — Patient Instructions (Addendum)
You have been scheduled for a colonoscopy. Please follow written instructions given to you at your visit today.  Please pick up your prep supplies at the pharmacy within the next 1-3 days. If you use inhalers (even only as needed), please bring them with you on the day of your procedure.  Go to the basement for labs today  If you are age 57 or older, your body mass index should be between 23-30. Your Body mass index is 26.63 kg/m. If this is out of the aforementioned range listed, please consider follow up with your Primary Care Provider.  If you are age 67 or younger, your body mass index should be between 19-25. Your Body mass index is 26.63 kg/m. If this is out of the aformentioned range listed, please consider follow up with your Primary Care Provider.    Take Beneifiber 1 teaspoon twice daily  I appreciate the  opportunity to care for you  Thank You   Harl Bowie , MD

## 2019-08-08 ENCOUNTER — Encounter: Payer: Self-pay | Admitting: Gastroenterology

## 2019-08-19 ENCOUNTER — Encounter: Payer: Self-pay | Admitting: Gastroenterology

## 2019-08-22 ENCOUNTER — Encounter: Payer: 59 | Admitting: Gastroenterology

## 2019-09-11 ENCOUNTER — Other Ambulatory Visit: Payer: Self-pay | Admitting: Family

## 2019-09-11 ENCOUNTER — Encounter: Payer: Self-pay | Admitting: Family

## 2019-09-11 ENCOUNTER — Ambulatory Visit: Payer: 59 | Admitting: Family

## 2019-09-11 ENCOUNTER — Other Ambulatory Visit: Payer: Self-pay

## 2019-09-11 VITALS — BP 110/78 | HR 80 | Temp 98.0°F | Ht 66.0 in | Wt 167.0 lb

## 2019-09-11 DIAGNOSIS — R5383 Other fatigue: Secondary | ICD-10-CM

## 2019-09-11 DIAGNOSIS — B009 Herpesviral infection, unspecified: Secondary | ICD-10-CM

## 2019-09-11 DIAGNOSIS — E785 Hyperlipidemia, unspecified: Secondary | ICD-10-CM | POA: Diagnosis not present

## 2019-09-11 DIAGNOSIS — E782 Mixed hyperlipidemia: Secondary | ICD-10-CM

## 2019-09-11 DIAGNOSIS — Z1321 Encounter for screening for nutritional disorder: Secondary | ICD-10-CM

## 2019-09-11 MED ORDER — TRIAMCINOLONE ACETONIDE 0.1 % EX CREA: 1.0000 "application " | TOPICAL_CREAM | Freq: Two times a day (BID) | CUTANEOUS | 0 refills | Status: DC

## 2019-09-11 MED ORDER — VALACYCLOVIR HCL 500 MG PO TABS: 500.0000 mg | ORAL_TABLET | Freq: Every day | ORAL | 1 refills | Status: DC

## 2019-09-11 NOTE — Progress Notes (Signed)
Katherine Beard is a 59 y.o. female with the following history as recorded in Epic[facility]:  Patient Active Problem List   Diagnosis Date Noted  . Sciatica 02/16/2011  . Pyriformis syndrome 02/15/2011  . Palpitations 11/16/2010  . Paresthesia 11/16/2010  . OBESITY 08/21/2009  . HYPERGLYCEMIA 02/26/2007  . Hyperlipidemia 02/20/2007  . DIVERTICULITIS, HX OF 12/20/2006    Current Outpatient Medications  Medication Sig Dispense Refill  . Cholecalciferol ([facility] D3) 125 MCG (5000 UT) capsule Take 5,000 Units by mouth daily.    . Cyanocobalamin 2500 MCG CHEW Chew 1 tablet by mouth daily.    . Glucosamine-Chondroit-Vit C-Mn (GLUCOSAMINE 1500 COMPLEX PO) Take 1 tablet by mouth daily.    . Magnesium Citrate 100 MG TABS Take 1 tablet by mouth daily.    . Multiple Vitamins-Minerals (CENTRUM ULTRA WOMENS PO) Take by mouth.    . Multiple Vitamins-Minerals (HAIR SKIN AND NAILS FORMULA) TABS Take 1 tablet by mouth daily.    . Na Sulfate-K Sulfate-Mg Sulf 17.5-3.13-1.6 GM/177ML SOLN 1 suprep kit for colonoscopy 354 mL 0  . Turmeric (QC TUMERIC COMPLEX) 500 MG CAPS Take 1 tablet by mouth daily.    Marland Kitchen triamcinolone cream (KENALOG) 0.1 % Apply 1 application topically 2 (two) times daily. 45 g 0  . valACYclovir (VALTREX) 500 MG tablet Take 1 tablet (500 mg total) by mouth daily. 90 tablet 1   No current facility-administered medications for this visit.    Allergies: Penicillins  Past Medical History:  Diagnosis Date  . Acne   . DIVERTICULITIS, HX OF   . Epicondylitis syndrome of elbow    RIGHT   . GERD (gastroesophageal reflux disease)   . Hepatic cyst 2015   per MRI  . HYPERGLYCEMIA   . HYPERLIPIDEMIA   . Hyperplastic colon polyp   . Iliotibial band syndrome of right side   . Liver cyst 2014  . Melanoma ([place]) 2014   Excised -clear margins  . OBESITY   . Palpitations   . Vitamin D deficiency     Past Surgical History:  Procedure Laterality Date  . COLECTOMY  09-16-03   sigmoid  . COLONOSCOPY   2014  . UMBILICAL LAPRPOSCOPY     AGE 52    Family History  Problem Relation Age of Onset  . Diabetes Mother   . Hypertension Mother   . Heart disease Mother        pacemaker  . Heart disease Paternal Grandfather   . Breast cancer Maternal Aunt   . Colon cancer Neg Hx     Social History   Tobacco Use  . Smoking status: Never Smoker  . Smokeless tobacco: Never Used  Substance Use Topics  . Alcohol use: Yes    Alcohol/week: 1.0 standard drinks    Types: 1 drink(s) per week    Comment: wine per week    Subjective:  Patient presents today as a new patient; Would like to get her labs updated but is not fasting today; history of elevated cholesterol;  Having CPE managed by GYN- [facility]; mammogram, pap smear, DEXA; Katherine Beard) Working on [employer]; scheduled for colonoscopy in the next few weeks due to history of diverticulitis/ rectal bleeding;  Is concerned about a recurrent rash on her left buttock; has re-flared 3-4 x in the past 1 year; painful, blister type rash; using topical cream for Shingles treatment with some benefit;    Objective:  Vitals:   09/11/19 1003  BP: 110/78  Pulse: 80  Temp: 98 F (36.7 C)  TempSrc: Oral  SpO2: 98%  Weight: 167 lb (75.8 kg)  Height: 5' 6"  (1.676 m)    General: Well developed, well nourished, in no acute distress  Skin : Warm and dry. Resolving vesicular erythematous cluster type lesions noted over left buttock Head: Normocephalic and atraumatic  Lungs: Respirations unlabored;  Musculoskeletal: No deformities; no active joint inflammation  Extremities: No edema, cyanosis, clubbing  Neurologic: Alert and oriented; speech intact; face symmetrical; moves all extremities well; CNII-XII intact without focal deficit    Assessment:  1. Hyperlipidemia, unspecified hyperlipidemia type   2. Herpes simplex     Plan:  1. Patient is not fasting today; she will return for fasting labs at her convenience; has been  working on healthy diet and lifestyle changes; has not been on medication in the past;  2. Discuss preventive vs symptomatic treatment; patient wants to try daily Valtrex x 6 months to see if this will limit frequency of outbreaks;  Scheduled for CPE with her GYN in the next month; scheduled for colonoscopy in the next 2 weeks;  She defers Tdap today- wants to check her records;  This visit occurred during the SARS-CoV-2 public health emergency.  Safety protocols were in place, including screening questions prior to the visit, additional usage of staff PPE, and extensive cleaning of exam room while observing appropriate contact time as indicated for disinfecting solutions.     No follow-ups on file.  No orders of the defined types were placed in this encounter.   Requested Prescriptions   Signed Prescriptions Disp Refills  . valACYclovir (VALTREX) 500 MG tablet 90 tablet 1    Sig: Take 1 tablet (500 mg total) by mouth daily.  Marland Kitchen triamcinolone cream (KENALOG) 0.1 % 45 g 0    Sig: Apply 1 application topically 2 (two) times daily.

## 2019-09-12 ENCOUNTER — Other Ambulatory Visit (INDEPENDENT_AMBULATORY_CARE_PROVIDER_SITE_OTHER): Payer: 59

## 2019-09-12 DIAGNOSIS — Z1321 Encounter for screening for nutritional disorder: Secondary | ICD-10-CM | POA: Diagnosis not present

## 2019-09-12 DIAGNOSIS — R5383 Other fatigue: Secondary | ICD-10-CM

## 2019-09-12 DIAGNOSIS — E782 Mixed hyperlipidemia: Secondary | ICD-10-CM

## 2019-09-12 LAB — COMPREHENSIVE METABOLIC PANEL
ALT: 14 U/L (ref 0–35)
AST: 15 U/L (ref 0–37)
Albumin: 4.1 g/dL (ref 3.5–5.2)
Alkaline Phosphatase: 52 U/L (ref 39–117)
BUN: 21 mg/dL (ref 6–23)
CO2: 30 mEq/L (ref 19–32)
Calcium: 9.5 mg/dL (ref 8.4–10.5)
Chloride: 104 mEq/L (ref 96–112)
Creatinine, Ser: 0.83 mg/dL (ref 0.40–1.20)
GFR: 71.05 mL/min (ref >60.00)
Glucose, Bld: 95 mg/dL (ref 70–99)
Potassium: 4.1 mEq/L (ref 3.5–5.1)
Sodium: 140 mEq/L (ref 135–145)
Total Bilirubin: 0.5 mg/dL (ref 0.2–1.2)
Total Protein: 6.1 g/dL (ref 6.0–8.3)

## 2019-09-12 LAB — LIPID PANEL
Cholesterol: 212 mg/dL — ABNORMAL HIGH (ref 0–200)
HDL: 68.1 mg/dL (ref >39.00)
LDL Cholesterol: 130 mg/dL — ABNORMAL HIGH (ref 0–99)
NonHDL: 144.36
Total CHOL/HDL Ratio: 3
Triglycerides: 74 mg/dL (ref 0.0–149.0)
VLDL: 14.8 mg/dL (ref 0.0–40.0)

## 2019-09-12 LAB — VITAMIN D 25 HYDROXY (VIT D DEFICIENCY, FRACTURES): VITD: 53.47 ng/mL (ref 30.00–100.00)

## 2019-09-12 LAB — TSH: TSH: 2.28 u[IU]/mL (ref 0.35–4.50)

## 2019-09-19 ENCOUNTER — Ambulatory Visit (AMBULATORY_SURGERY_CENTER): Payer: 59 | Admitting: Gastroenterology

## 2019-09-19 ENCOUNTER — Encounter: Payer: Self-pay | Admitting: Gastroenterology

## 2019-09-19 ENCOUNTER — Other Ambulatory Visit: Payer: Self-pay

## 2019-09-19 VITALS — BP 101/42 | HR 56 | Temp 98.2°F | Resp 25 | Ht 66.0 in | Wt 165.0 lb

## 2019-09-19 DIAGNOSIS — Z1211 Encounter for screening for malignant neoplasm of colon: Secondary | ICD-10-CM

## 2019-09-19 HISTORY — PX: COLONOSCOPY: SHX174

## 2019-09-19 MED ORDER — SODIUM CHLORIDE 0.9 % IV SOLN: 500.0000 mL | Freq: Once | INTRAVENOUS | Status: DC

## 2019-09-19 NOTE — Progress Notes (Signed)
Temp LC Vitals DT  Pt's states no medical or surgical changes since previsit or office visit. 

## 2019-09-19 NOTE — Patient Instructions (Signed)
Please read handouts provided. Continue present medications.   YOU HAD AN ENDOSCOPIC PROCEDURE TODAY AT THE [facility]:   Refer to the procedure report that was given to you for any specific questions about what was found during the examination.  If the procedure report does not answer your questions, please call your gastroenterologist to clarify.  If you requested that your care partner not be given the details of your procedure findings, then the procedure report has been included in a sealed envelope for you to review at your convenience later.  YOU SHOULD EXPECT: Some feelings of bloating in the abdomen. Passage of more gas than usual.  Walking can help get rid of the air that was put into your GI tract during the procedure and reduce the bloating. If you had a lower endoscopy (such as a colonoscopy or flexible sigmoidoscopy) you may notice spotting of blood in your stool or on the toilet paper. If you underwent a bowel prep for your procedure, you may not have a normal bowel movement for a few days.  Please Note:  You might notice some irritation and congestion in your nose or some drainage.  This is from the oxygen used during your procedure.  There is no need for concern and it should clear up in a day or so.  SYMPTOMS TO REPORT IMMEDIATELY:  Following lower endoscopy (colonoscopy or flexible sigmoidoscopy):  Excessive amounts of blood in the stool  Significant tenderness or worsening of abdominal pains  Swelling of the abdomen that is new, acute  Fever of 100F or higher   For urgent or emergent issues, a gastroenterologist can be reached at any hour by calling [phone]. Do not use MyChart messaging for urgent concerns.    DIET:  We do recommend a small meal at first, but then you may proceed to your regular diet.  Drink plenty of fluids but you should avoid alcoholic beverages for 24 hours.  ACTIVITY:  You should plan to take it easy for the rest of today and  you should NOT DRIVE or use heavy machinery until tomorrow (because of the sedation medicines used during the test).    FOLLOW UP: Our staff will call the number listed on your records 48-72 hours following your procedure to check on you and address any questions or concerns that you may have regarding the information given to you following your procedure. If we do not reach you, we will leave a message.  We will attempt to reach you two times.  During this call, we will ask if you have developed any symptoms of COVID 19. If you develop any symptoms (ie: fever, flu-like symptoms, shortness of breath, cough etc.) before then, please call [phone].  If you test positive for Covid 19 in the 2 weeks post procedure, please call and report this information to us.    If any biopsies were taken you will be contacted by phone or by letter within the next 1-3 weeks.  Please call us at [phone] if you have not heard about the biopsies in 3 weeks.    SIGNATURES/CONFIDENTIALITY: You and/or your care partner have signed paperwork which will be entered into your electronic medical record.  These signatures attest to the fact that that the information above on your After Visit Summary has been reviewed and is understood.  Full responsibility of the confidentiality of this discharge information lies with you and/or your care-partner.  

## 2019-09-19 NOTE — Progress Notes (Signed)
pt tolerated well. VSS. awake and to recovery. Report given to RN.  

## 2019-09-19 NOTE — Op Note (Signed)
[facility] Patient Name: Katherine Beard Procedure Date: 09/19/2019 9:25 AM MRN: [id number] Endoscopist: Mauri Pole , MD Age: 59 Referring MD:  Date of Birth: [birth year] Gender: Female Account #: [id number] Procedure:                Colonoscopy Indications:              Screening for colorectal malignant neoplasm Medicines:                Monitored Anesthesia Care Procedure:                Pre-Anesthesia Assessment:                           - Prior to the procedure, a History and Physical                            was performed, and patient medications and                            allergies were reviewed. The patient's tolerance of                            previous anesthesia was also reviewed. The risks                            and benefits of the procedure and the sedation                            options and risks were discussed with the patient.                            All questions were answered, and informed consent                            was obtained. Prior Anticoagulants: The patient has                            taken no previous anticoagulant or antiplatelet                            agents. ASA Grade Assessment: II - A patient with                            mild systemic disease. After reviewing the risks                            and benefits, the patient was deemed in                            satisfactory condition to undergo the procedure.                           After obtaining informed consent, the colonoscope  was passed under direct vision. Throughout the                            procedure, the patient's blood pressure, pulse, and                            oxygen saturations were monitored continuously. The                            Colonoscope was introduced through the anus and                            advanced to the the cecum, identified by                            appendiceal orifice and  ileocecal valve. The                            colonoscopy was performed without difficulty. The                            patient tolerated the procedure well. The quality                            of the bowel preparation was excellent. The                            ileocecal valve, appendiceal orifice, and rectum                            were photographed. Scope In: 9:34:00 AM Scope Out: 9:47:00 AM Scope Withdrawal Time: 0 hours 9 minutes 18 seconds  Total Procedure Duration: 0 hours 13 minutes 0 seconds  Findings:                 The perianal and digital rectal examinations were                            normal.                           There was evidence of a prior end-to-end                            colo-colonic anastomosis in the sigmoid colon. This                            was patent and was characterized by healthy                            appearing mucosa. The anastomosis was traversed.                           Scattered small and large-mouthed diverticula were  found in the recto-sigmoid colon, descending colon,                            transverse colon and ascending colon.                           Non-bleeding internal hemorrhoids were found during                            retroflexion. The hemorrhoids were small.                           The exam was otherwise without abnormality. Complications:            No immediate complications. Estimated Blood Loss:     Estimated blood loss: none. Impression:               - Patent end-to-end colo-colonic anastomosis,                            characterized by healthy appearing mucosa.                           - Moderate diverticulosis in the recto-sigmoid                            colon, in the descending colon, in the transverse                            colon and in the ascending colon.                           - Non-bleeding internal hemorrhoids.                           - The  examination was otherwise normal.                           - No specimens collected. Recommendation:           - Patient has a contact number available for                            emergencies. The signs and symptoms of potential                            delayed complications were discussed with the                            patient. Return to normal activities tomorrow.                            Written discharge instructions were provided to the                            patient.                           -  Resume previous diet.                           - Continue present medications.                           - Repeat colonoscopy in 10 years for screening                            purposes. Mauri Pole, MD 09/19/2019 9:52:01 AM This report has been signed electronically.

## 2019-09-23 ENCOUNTER — Telehealth: Payer: Self-pay

## 2019-09-23 ENCOUNTER — Telehealth: Payer: Self-pay | Admitting: *Deleted

## 2019-09-23 NOTE — Telephone Encounter (Signed)
Left message on follow up call. 

## 2019-09-23 NOTE — Telephone Encounter (Signed)
  Follow up Call-  Call back number 09/19/2019  Post procedure Call Back phone  # [phone]  Permission to leave phone message Yes  Some recent data might be hidden     Patient questions:  Do you have a fever, pain , or abdominal swelling? No. Pain Score  0 *  Have you tolerated food without any problems? Yes.    Have you been able to return to your normal activities? Yes.    Do you have any questions about your discharge instructions: Diet   No. Medications  No. Follow up visit  No.  Do you have questions or concerns about your Care? No.  Actions: * If pain score is 4 or above: No action needed, pain <4.   1. Have you developed a fever since your procedure? no  2.   Have you had an respiratory symptoms (SOB or cough) since your procedure? no  3.   Have you tested positive for COVID 19 since your procedure no  4.   Have you had any family members/close contacts diagnosed with the COVID 19 since your procedure?  no   If yes to any of these questions please route to Joylene John, RN and Alphonsa Gin, Therapist, sports.

## 2020-02-04 ENCOUNTER — Encounter: Payer: Self-pay | Admitting: Plastic Surgery

## 2020-02-04 ENCOUNTER — Other Ambulatory Visit: Payer: Self-pay

## 2020-02-04 ENCOUNTER — Ambulatory Visit (INDEPENDENT_AMBULATORY_CARE_PROVIDER_SITE_OTHER): Payer: Self-pay | Admitting: Plastic Surgery

## 2020-02-04 DIAGNOSIS — Z719 Counseling, unspecified: Secondary | ICD-10-CM | POA: Insufficient documentation

## 2020-02-04 NOTE — Progress Notes (Signed)
Patient ID: Katherine Beard, female    DOB: [birth year], 59 y.o.   MRN: [id number]   Chief Complaint  Patient presents with  . Advice Only  . Skin Problem    Patient is a 59 year old female here for evaluation of her abdomen and thighs.  She is 5 feet 5 inches tall and weighs 165 pounds.  She has recently lost 30 pounds and is hoping to lose another 15.  She had colon surgery approximately 15 years ago.  She has hyperlipidemia, history of diverticulosis and liver cysts.  Her record also indicates a history of melanoma.  She is not a smoker.  She does not have a hernia or diastasis.  She has some excess skin and fat in the abdominal area and the medial thighs.  She looked into cool sculpting but was not found to be a good candidate.   Review of Systems  Constitutional: Negative for activity change and appetite change.  HENT: Negative.   Eyes: Negative.   Respiratory: Negative for chest tightness and shortness of breath.   Cardiovascular: Negative for leg swelling.  Gastrointestinal: Negative for abdominal distention and abdominal pain.  Endocrine: Negative.   Genitourinary: Negative.   Musculoskeletal: Negative.   Skin: Negative for wound.  Neurological: Negative.   Hematological: Negative.   Psychiatric/Behavioral: Negative.     Past Medical History:  Diagnosis Date  . Acne   . DIVERTICULITIS, HX OF   . Epicondylitis syndrome of elbow    RIGHT   . GERD (gastroesophageal reflux disease)   . Hepatic cyst 2015   per MRI  . HYPERGLYCEMIA   . HYPERLIPIDEMIA   . Hyperplastic colon polyp   . Iliotibial band syndrome of right side   . Liver cyst 2014  . Melanoma ([place]) 2014   Excised -clear margins  . OBESITY   . Palpitations   . Vitamin D deficiency     Past Surgical History:  Procedure Laterality Date  . COLECTOMY  09-16-03   sigmoid  . COLONOSCOPY  2014  . COLONOSCOPY  09/19/2019  . UMBILICAL LAPRPOSCOPY     AGE 59      Current Outpatient Medications:  .   Cholecalciferol ([employer] D3) 125 MCG (5000 UT) capsule, Take 5,000 Units by mouth daily., Disp: , Rfl:  .  Cyanocobalamin 2500 MCG CHEW, Chew 1 tablet by mouth daily., Disp: , Rfl:  .  Glucosamine-Chondroit-Vit C-Mn (GLUCOSAMINE 1500 COMPLEX PO), Take 1 tablet by mouth daily., Disp: , Rfl:  .  Magnesium Citrate 100 MG TABS, Take 1 tablet by mouth daily., Disp: , Rfl:  .  Multiple Vitamins-Minerals (CENTRUM ULTRA WOMENS PO), Take by mouth., Disp: , Rfl:  .  Multiple Vitamins-Minerals (HAIR SKIN AND NAILS FORMULA) TABS, Take 1 tablet by mouth daily., Disp: , Rfl:  .  triamcinolone cream (KENALOG) 0.1 %, Apply 1 application topically 2 (two) times daily., Disp: 45 g, Rfl: 0 .  Turmeric (QC TUMERIC COMPLEX) 500 MG CAPS, Take 1 tablet by mouth daily., Disp: , Rfl:  .  valACYclovir (VALTREX) 500 MG tablet, Take 1 tablet (500 mg total) by mouth daily., Disp: 90 tablet, Rfl: 1 .  Wheat Dextrin (BENEFIBER PO), Take 1 Dose by mouth daily., Disp: , Rfl:    Objective:   Vitals:   02/04/20 0810  BP: 112/74  Pulse: (!) 58  Temp: (!) 97.1 F (36.2 C)  SpO2: 100%    Physical Exam Vitals and nursing note reviewed.  Constitutional:  Appearance: Normal appearance.  HENT:     Head: Normocephalic and atraumatic.  Eyes:     Extraocular Movements: Extraocular movements intact.  Cardiovascular:     Rate and Rhythm: Normal rate.     Pulses: Normal pulses.  Pulmonary:     Effort: Pulmonary effort is normal. No respiratory distress.  Abdominal:     General: Abdomen is flat. There is no distension.     Palpations: There is no mass.     Tenderness: There is no abdominal tenderness. There is no guarding.     Hernia: No hernia is present.  Skin:    General: Skin is warm.     Capillary Refill: Capillary refill takes less than 2 seconds.  Neurological:     General: No focal deficit present.     Mental Status: She is alert and oriented to person, place, and time.  Psychiatric:        Mood and Affect:  Mood normal.        Behavior: Behavior normal.        Thought Content: Thought content normal.     Assessment & Plan:  Encounter for counseling  Recommend abdominoplasty with liposuction of the flanks and medial abdomen.  She is also a good candidate for medial thigh liposuction.  I did discuss that this would not resolve the skin or tighten the skin but would help reduce the bulk of it in the medial thigh area.  Pictures were obtained of the patient and placed in the chart with the patient's or guardian's permission.   [place], DO

## 2020-02-18 ENCOUNTER — Other Ambulatory Visit: Payer: 59

## 2020-04-13 ENCOUNTER — Telehealth: Payer: Self-pay | Admitting: Family

## 2020-04-13 NOTE — Telephone Encounter (Signed)
Patient was seen by her insurance telehealth provider for a inner ear balance problem. They told her to follow up with her ENT. She has not seen them in some time and they are requesting a referral to be sent by her PCP.    [facility] [phone].

## 2020-04-14 ENCOUNTER — Other Ambulatory Visit: Payer: Self-pay | Admitting: Family

## 2020-04-14 DIAGNOSIS — R42 Dizziness and giddiness: Secondary | ICD-10-CM

## 2020-06-06 ENCOUNTER — Other Ambulatory Visit: Payer: Self-pay | Admitting: Family

## 2020-06-24 ENCOUNTER — Other Ambulatory Visit: Payer: Self-pay

## 2020-06-24 ENCOUNTER — Encounter: Payer: Self-pay | Admitting: Family

## 2020-06-24 ENCOUNTER — Ambulatory Visit (INDEPENDENT_AMBULATORY_CARE_PROVIDER_SITE_OTHER): Payer: 59

## 2020-06-24 ENCOUNTER — Ambulatory Visit: Payer: 59 | Admitting: Family

## 2020-06-24 VITALS — BP 122/74 | HR 56 | Temp 98.0°F | Ht 65.0 in | Wt 168.0 lb

## 2020-06-24 DIAGNOSIS — M545 Low back pain, unspecified: Secondary | ICD-10-CM

## 2020-06-24 MED ORDER — MELOXICAM 15 MG PO TABS: 15.0000 mg | ORAL_TABLET | Freq: Every day | ORAL | 0 refills | Status: DC

## 2020-06-24 MED ORDER — CYCLOBENZAPRINE HCL 10 MG PO TABS: 10.0000 mg | ORAL_TABLET | Freq: Three times a day (TID) | ORAL | 0 refills | Status: DC | PRN

## 2020-06-24 NOTE — Progress Notes (Signed)
Katherine Beard is a 59 y.o. female with the following history as recorded in Epic[employer]:  Patient Active Problem List   Diagnosis Date Noted  . Encounter for counseling 02/04/2020  . Sciatica 02/16/2011  . Pyriformis syndrome 02/15/2011  . Palpitations 11/16/2010  . Paresthesia 11/16/2010  . OBESITY 08/21/2009  . HYPERGLYCEMIA 02/26/2007  . Hyperlipidemia 02/20/2007  . DIVERTICULITIS, HX OF 12/20/2006    Current Outpatient Medications  Medication Sig Dispense Refill  . Cholecalciferol (CVS D3) 125 MCG (5000 UT) capsule Take 5,000 Units by mouth daily.    . Cyanocobalamin 2500 MCG CHEW Chew 1 tablet by mouth daily.    . Glucosamine-Chondroit-Vit C-Mn (GLUCOSAMINE 1500 COMPLEX PO) Take 1 tablet by mouth daily.    . Magnesium Citrate 100 MG TABS Take 1 tablet by mouth daily.    . Multiple Vitamins-Minerals (CENTRUM ULTRA WOMENS PO) Take by mouth.    . Multiple Vitamins-Minerals (HAIR SKIN AND NAILS FORMULA) TABS Take 1 tablet by mouth daily.    Marland Kitchen triamcinolone cream (KENALOG) 0.1 % Apply 1 application topically 2 (two) times daily. 45 g 0  . Turmeric 500 MG CAPS Take 1 tablet by mouth daily.    . valACYclovir (VALTREX) 500 MG tablet TAKE 1 TABLET BY MOUTH EVERY DAY 30 tablet 5  . Wheat Dextrin (BENEFIBER PO) Take 1 Dose by mouth daily.    . cyclobenzaprine (FLEXERIL) 10 MG tablet Take 1 tablet (10 mg total) by mouth 3 (three) times daily as needed for muscle spasms. 30 tablet 0  . meloxicam (MOBIC) 15 MG tablet Take 1 tablet (15 mg total) by mouth daily. 30 tablet 0   No current facility-administered medications for this visit.    Allergies: Penicillins  Past Medical History:  Diagnosis Date  . Acne   . DIVERTICULITIS, HX OF   . Epicondylitis syndrome of elbow    RIGHT   . GERD (gastroesophageal reflux disease)   . Hepatic cyst 2015   per MRI  . HYPERGLYCEMIA   . HYPERLIPIDEMIA   . Hyperplastic colon polyp   . Iliotibial band syndrome of right side   . Liver cyst 2014  .  Melanoma ([place]) 2014   Excised -clear margins  . OBESITY   . Palpitations   . Vitamin D deficiency     Past Surgical History:  Procedure Laterality Date  . COLECTOMY  09-16-03   sigmoid  . COLONOSCOPY  2014  . COLONOSCOPY  09/19/2019  . UMBILICAL LAPRPOSCOPY     AGE 69    Family History  Problem Relation Age of Onset  . Diabetes Mother   . Hypertension Mother   . Heart disease Mother        pacemaker  . Heart disease Paternal Grandfather   . Breast cancer Maternal Aunt   . Colon cancer Neg Hx   . Colon polyps Neg Hx   . Esophageal cancer Neg Hx   . Rectal cancer Neg Hx   . Stomach cancer Neg Hx     Social History   Tobacco Use  . Smoking status: Never Smoker  . Smokeless tobacco: Never Used  Substance Use Topics  . Alcohol use: Yes    Alcohol/week: 1.0 standard drink    Types: 1 drink(s) per week    Comment: wine per week    Subjective:  Low back pain x 3 week; had vertigo spell while changing clothes and injured her back trying to prevent fall; has already had virtual visit with [facility]- was given 6  day course of prednisone with limited benefit- started on 12/6- finished by 12/12; Is taking Ibuprofen and heating pad; history of back injury in her 38s- herniated disc but no numbness/ tingling; no change in bowel or bladder habits;      Objective:  Vitals:   06/24/20 0921  BP: 122/74  Pulse: (!) 56  Temp: 98 F (36.7 C)  TempSrc: Oral  SpO2: 96%  Weight: 168 lb (76.2 kg)  Height: 5\' 5"  (1.651 m)    General: Well developed, well nourished, in no acute distress  Skin : Warm and dry.  Head: Normocephalic and atraumatic  Lungs: Respirations unlabored;  Musculoskeletal: No deformities; no active joint inflammation  Extremities: No edema, cyanosis, clubbing  Vessels: Symmetric bilaterally  Neurologic: Alert and oriented; speech intact; face symmetrical; moves all extremities well; CNII-XII intact without focal deficit   Assessment:  1. Acute low back pain,  unspecified back pain laterality, unspecified whether sciatica present     Plan:  Update lumbar X-ray today; change to Mobic 15 mg daily, Flexeril 10 mg tid;   Will have her plan to see Dr. Tamala Julian in sports medicine- may benefit from a manipulation and/or PT; follow-up as needed after seeing sports medicine.   No follow-ups on file.  Orders Placed This Encounter  Procedures  . DG Lumbar Spine Complete    Standing Status:   Future    Number of Occurrences:   1    Standing Expiration Date:   06/24/2021    Order Specific Question:   Reason for Exam (SYMPTOM  OR DIAGNOSIS REQUIRED)    Answer:   low back pain    Order Specific Question:   Is patient pregnant?    Answer:   No    Order Specific Question:   Preferred imaging location?    Answer:   [facility]    Requested Prescriptions   Signed Prescriptions Disp Refills  . meloxicam (MOBIC) 15 MG tablet 30 tablet 0    Sig: Take 1 tablet (15 mg total) by mouth daily.  . cyclobenzaprine (FLEXERIL) 10 MG tablet 30 tablet 0    Sig: Take 1 tablet (10 mg total) by mouth 3 (three) times daily as needed for muscle spasms.

## 2020-06-29 ENCOUNTER — Encounter: Payer: Self-pay | Admitting: Family

## 2020-07-01 ENCOUNTER — Ambulatory Visit: Payer: 59 | Admitting: Family Medicine

## 2020-07-02 ENCOUNTER — Telehealth: Payer: Self-pay | Admitting: Family

## 2020-07-02 NOTE — Telephone Encounter (Signed)
Notified patient that the provider is out of the office until next Monday, someone will return her call soon.

## 2020-07-02 NOTE — Telephone Encounter (Signed)
Patient is requesting a call back in regards to her recent x-ray results.

## 2020-07-08 ENCOUNTER — Other Ambulatory Visit: Payer: Self-pay

## 2020-07-08 ENCOUNTER — Encounter: Payer: Self-pay | Admitting: Family Medicine

## 2020-07-08 ENCOUNTER — Ambulatory Visit (INDEPENDENT_AMBULATORY_CARE_PROVIDER_SITE_OTHER): Payer: 59 | Admitting: Family Medicine

## 2020-07-08 VITALS — BP 122/70 | HR 80 | Ht 65.0 in | Wt 178.0 lb

## 2020-07-08 DIAGNOSIS — M545 Low back pain, unspecified: Secondary | ICD-10-CM | POA: Diagnosis not present

## 2020-07-08 DIAGNOSIS — M5416 Radiculopathy, lumbar region: Secondary | ICD-10-CM | POA: Insufficient documentation

## 2020-07-08 DIAGNOSIS — G8929 Other chronic pain: Secondary | ICD-10-CM

## 2020-07-08 DIAGNOSIS — M2550 Pain in unspecified joint: Secondary | ICD-10-CM

## 2020-07-08 LAB — COMPREHENSIVE METABOLIC PANEL
ALT: 16 U/L (ref 0–35)
AST: 16 U/L (ref 0–37)
Albumin: 4.6 g/dL (ref 3.5–5.2)
Alkaline Phosphatase: 63 U/L (ref 39–117)
BUN: 28 mg/dL — ABNORMAL HIGH (ref 6–23)
CO2: 31 mEq/L (ref 19–32)
Calcium: 9.8 mg/dL (ref 8.4–10.5)
Chloride: 101 mEq/L (ref 96–112)
Creatinine, Ser: 1.19 mg/dL (ref 0.40–1.20)
GFR: 50.91 mL/min — ABNORMAL LOW (ref >60.00)
Glucose, Bld: 100 mg/dL — ABNORMAL HIGH (ref 70–99)
Potassium: 3.8 mEq/L (ref 3.5–5.1)
Sodium: 138 mEq/L (ref 135–145)
Total Bilirubin: 0.3 mg/dL (ref 0.2–1.2)
Total Protein: 6.6 g/dL (ref 6.0–8.3)

## 2020-07-08 LAB — VITAMIN D 25 HYDROXY (VIT D DEFICIENCY, FRACTURES): VITD: 69.83 ng/mL (ref 30.00–100.00)

## 2020-07-08 LAB — C-REACTIVE PROTEIN: CRP: <1 mg/dL (ref 0.5–20.0)

## 2020-07-08 LAB — CBC WITH DIFFERENTIAL/PLATELET
Basophils Absolute: 0 10*3/uL (ref 0.0–0.1)
Basophils Relative: 0.8 % (ref 0.0–3.0)
Eosinophils Absolute: 0.1 10*3/uL (ref 0.0–0.7)
Eosinophils Relative: 1.9 % (ref 0.0–5.0)
HCT: 35.6 % — ABNORMAL LOW (ref 36.0–46.0)
Hemoglobin: 12.4 g/dL (ref 12.0–15.0)
Lymphocytes Relative: 34.5 % (ref 12.0–46.0)
Lymphs Abs: 1.6 10*3/uL (ref 0.7–4.0)
MCHC: 34.7 g/dL (ref 30.0–36.0)
MCV: 97.3 fl (ref 78.0–100.0)
Monocytes Absolute: 0.3 10*3/uL (ref 0.1–1.0)
Monocytes Relative: 7.1 % (ref 3.0–12.0)
Neutro Abs: 2.6 10*3/uL (ref 1.4–7.7)
Neutrophils Relative %: 55.7 % (ref 43.0–77.0)
Platelets: 251 10*3/uL (ref 150.0–400.0)
RBC: 3.66 Mil/uL — ABNORMAL LOW (ref 3.87–5.11)
RDW: 13.8 % (ref 11.5–15.5)
WBC: 4.7 10*3/uL (ref 4.0–10.5)

## 2020-07-08 LAB — TSH: TSH: 2.92 u[IU]/mL (ref 0.35–4.50)

## 2020-07-08 LAB — IBC PANEL
Iron: 65 ug/dL (ref 42–145)
Saturation Ratios: 17.4 % — ABNORMAL LOW (ref 20.0–50.0)
Transferrin: 267 mg/dL (ref 212.0–360.0)

## 2020-07-08 LAB — URIC ACID: Uric Acid, Serum: 6.3 mg/dL (ref 2.4–7.0)

## 2020-07-08 LAB — SEDIMENTATION RATE: Sed Rate: 8 mm/hr (ref 0–30)

## 2020-07-08 MED ORDER — GABAPENTIN 100 MG PO CAPS: 200.0000 mg | ORAL_CAPSULE | Freq: Every day | ORAL | 3 refills | Status: DC

## 2020-07-08 MED ORDER — METHYLPREDNISOLONE ACETATE 80 MG/ML IJ SUSP
80.0000 mg | Freq: Once | INTRAMUSCULAR | Status: AC
  Administered 2020-07-08: 80 mg via INTRAMUSCULAR

## 2020-07-08 NOTE — Progress Notes (Signed)
[facility] [address] [place] Phone: [phone] Subjective:   I Katherine Beard am serving as a Education administrator for Dr. Hulan Saas.  This visit occurred during the SARS-CoV-2 public health emergency.  Safety protocols were in place, including screening questions prior to the visit, additional usage of staff PPE, and extensive cleaning of exam room while observing appropriate contact time as indicated for disinfecting solutions.   I'm seeing this patient by the request  of:  Katherine Salvage, FNP  CC: Low back pain  [id number]  Katherine Beard is a 61 y.o. female coming in with complaint of back pain. Patient states she has lower back pain. About a month ago yesterday she had virtigo. States she was trying on clothes in the store when she extended her back and felt a pop in her back. Tried ice, ibuprofen, flexeril, meloxicam, heating pad, diclofenac, CBD gel, theragun, tens unit, walking, topical cream, epsom salt and prednisone. Since the injury she has gotten worse and the pain has shifted. Pain takes her breath away. States the pain is at the top of her hips bilaterally. States she feels like something is loose and her hips pop a lot. Pain between the shoulder blades as well. States it is hard to lift her feet to do ADLs. Believes her back is weak as well due to not being able to hold her body. States that lifting her upper body causes pain. History of herniated disc. Left leg has been tingly. 10/10 at its worse. States that even sweeping is painful. Ran 3 miles the day of the injury. Also uncomfortable sitting.  Patient denies any recent illnesses.  Patient has had though some dizziness recently.  Patient has been worked up for this and was to start physical therapy but has not been able to.  Cannot do the home exercises secondary to the back pain.    Patient lumbar x-rays were taken June 24, 2020. He does have mild scoliosis with some  degenerative disc disease mild to moderate at L2 L3, L4-L5 these were independently visualized by me.  Past Medical History:  Diagnosis Date  . Acne   . DIVERTICULITIS, HX OF   . Epicondylitis syndrome of elbow    RIGHT   . GERD (gastroesophageal reflux disease)   . Hepatic cyst 2015   per MRI  . HYPERGLYCEMIA   . HYPERLIPIDEMIA   . Hyperplastic colon polyp   . Iliotibial band syndrome of right side   . Liver cyst 2014  . Melanoma ([place]) 2014   Excised -clear margins  . OBESITY   . Palpitations   . Vitamin D deficiency    Past Surgical History:  Procedure Laterality Date  . COLECTOMY  09-16-03   sigmoid  . COLONOSCOPY  2014  . COLONOSCOPY  09/19/2019  . UMBILICAL LAPRPOSCOPY     AGE 28   Social History   Socioeconomic History  . Marital status: Married    Spouse name: Not on file  . Number of children: 2  . Years of education: Not on file  . Highest education level: Not on file  Occupational History  . Occupation: radio advertising  Tobacco Use  . Smoking status: Never Smoker  . Smokeless tobacco: Never Used  Substance and Sexual Activity  . Alcohol use: Yes    Alcohol/week: 1.0 standard drink    Types: 1 drink(s) per week    Comment: wine per week  . Drug use: No  .  Sexual activity: Not on file  Other Topics Concern  . Not on file  Social History Narrative  . Not on file   Social Determinants of Health   Financial Resource Strain: Not on file  Food Insecurity: Not on file  Transportation Needs: Not on file  Physical Activity: Not on file  Stress: Not on file  Social Connections: Not on file   Allergies  Allergen Reactions  . Penicillins     REACTION: urticaria (hives)   Family History  Problem Relation Age of Onset  . Diabetes Mother   . Hypertension Mother   . Heart disease Mother        pacemaker  . Heart disease Paternal Grandfather   . Breast cancer Maternal Aunt   . Colon cancer Neg Hx   . Colon polyps Neg Hx   . Esophageal cancer  Neg Hx   . Rectal cancer Neg Hx   . Stomach cancer Neg Hx        Current Outpatient Medications (Analgesics):  .  meloxicam (MOBIC) 15 MG tablet, Take 1 tablet (15 mg total) by mouth daily.  Current Outpatient Medications (Hematological):  Marland Kitchen  Cyanocobalamin 2500 MCG CHEW, Chew 1 tablet by mouth daily.  Current Outpatient Medications (Other):  Marland Kitchen  Cholecalciferol ([employer] D3) 125 MCG (5000 UT) capsule, Take 5,000 Units by mouth daily. .  cyclobenzaprine (FLEXERIL) 10 MG tablet, Take 1 tablet (10 mg total) by mouth 3 (three) times daily as needed for muscle spasms. Marland Kitchen  gabapentin (NEURONTIN) 100 MG capsule, Take 2 capsules (200 mg total) by mouth at bedtime. .  Glucosamine-Chondroit-Vit C-Mn (GLUCOSAMINE 1500 COMPLEX PO), Take 1 tablet by mouth daily. .  Magnesium Citrate 100 MG TABS, Take 1 tablet by mouth daily. .  Multiple Vitamins-Minerals (CENTRUM ULTRA WOMENS PO), Take by mouth. .  Multiple Vitamins-Minerals (HAIR SKIN AND NAILS FORMULA) TABS, Take 1 tablet by mouth daily. Marland Kitchen  triamcinolone cream (KENALOG) 0.1 %, Apply 1 application topically 2 (two) times daily. .  Turmeric 500 MG CAPS, Take 1 tablet by mouth daily. .  valACYclovir (VALTREX) 500 MG tablet, TAKE 1 TABLET BY MOUTH EVERY DAY .  Wheat Dextrin (BENEFIBER PO), Take 1 Dose by mouth daily.   Reviewed prior external information including notes and imaging from  primary care provider As well as notes that were available from care everywhere and other healthcare systems.  Past medical history, social, surgical and family history all reviewed in electronic medical record.  No pertanent information unless stated regarding to the chief complaint.   Review of Systems:  No headache, visual changes, nausea, vomiting, diarrhea, constipation,  abdominal pain, skin rash, fevers, chills, night sweats, weight loss, swollen lymph nodes, body aches, joint swelling, chest pain, shortness of breath, mood changes. POSITIVE muscle aches,  dizziness  Objective  Blood pressure 122/70, pulse 80, height 5\' 5"  (1.651 m), weight 178 lb (80.7 kg), last menstrual period 03/06/2011, SpO2 100 %.   General: No apparent distress alert and oriented x3 mood and affect normal, dressed appropriately.  HEENT: Pupils equal, extraocular movements intact  Respiratory: Patient's speak in full sentences and does not appear short of breath  Cardiovascular: No lower extremity edema, non tender, no erythema  Patient is severely stiff in the lower back.  Patient does have tenderness to palpation more in the L4-L5 and L5-S1 paraspinal musculature.  Pain over the sacroiliac joints bilaterally.  No abdominal pain noted on exam today. Patient does have tightness with straight leg test worsening pain  with any type of extension greater than 5 degrees of the back and anything greater than 10 degrees of flexion.  Patient's pain does seem to be out of proportion to even light sensation.  Increasing discomfort and pain with FABER test bilaterally sacroiliac joints.  No weakness of the lower extremities.   Impression and Recommendations:     The above documentation has been reviewed and is accurate and complete Lyndal Pulley, DO

## 2020-07-08 NOTE — Assessment & Plan Note (Addendum)
Patient is having signs and symptoms consistent with more of a lumbar radiculopathy.  Patient did have an injury approximately 1 month ago and has slowly been worsening.  People have tried different medications including Flexeril, prednisone, meloxicam and patient is not improving.  Some radicular symptoms that seems to be sharp overall.  Concerned that patient does have a potential herniated disc giving more trouble.  Patient denies any bowel or bladder incontinence, any change in bowel habits, denies any fevers chills or abnormal weight loss.  Due to the severity of pain that seems to be out of proportion and the radicular symptoms I do feel that advanced imaging is warranted in this individual.  We will get an MRI.  Depending on findings she could be a candidate for epidurals.  Patient does state that her neighbor is a Midwife and she would like to discuss with them as well.  Depo-Medrol given today but patient declined Toradol secondary to it potentially stinging.  Patient can continue the meloxicam at this point.  Start gabapentin at night.  We will discussed with patient after MRI is done for further evaluation and treatment options.  After exam reviewed patient's chart.  Patient continued to have some of the dizziness and patient not responding to the medications previously given by other providers I would like to get laboratory work-up to rule out any other inflammatory markers or infectious etiology that could be contributing.

## 2020-07-08 NOTE — Addendum Note (Signed)
Addended by: Ebony Cargo on: 07/08/2020 03:09 PM   Modules accepted: Orders

## 2020-07-08 NOTE — Patient Instructions (Addendum)
Good to see you DepoMedrol injection today Gabapentin 100 mg at night for nerve pain MRI ordered After MRI we will discuss next steps and talk to your neighbor See me again in 3-4 weeks we will talk based on MRI

## 2020-07-09 ENCOUNTER — Telehealth: Payer: Self-pay | Admitting: Family

## 2020-07-09 ENCOUNTER — Telehealth: Payer: Self-pay | Admitting: Family Medicine

## 2020-07-09 ENCOUNTER — Other Ambulatory Visit: Payer: Self-pay

## 2020-07-09 MED ORDER — PREDNISONE 20 MG PO TABS: 40.0000 mg | ORAL_TABLET | Freq: Every day | ORAL | 0 refills | Status: DC

## 2020-07-09 NOTE — Telephone Encounter (Signed)
Lets do prednisone daily for 5 days of 40 mg  Stop all anti-inflammatories  Lets give the gabapentin a chance to work as well

## 2020-07-09 NOTE — Telephone Encounter (Signed)
    Katherine Beard from [facility] of [employer] [phone] Fax [phone]  Requesting order for PT eval and treat for dizziness  Phone [phone] Fax [phone]

## 2020-07-09 NOTE — Telephone Encounter (Signed)
Patient called in response to her labs and the response from Dr Katrinka Blazing. She said that she has not taken ibuprofen and Meloxicam together. She said that for example she would take Meloxicam one day in the morning and then the next day she would take Ibuprofen. She said that she noticed that Meloxicam did not provide as much relief but she had been taking that more than the ibuprofen.  She would like to go ahead and try the prednisone.  She also asked if there was anything else she could do to help with the pain? She has been using heat and ice but that does not seem to give much relief.  Please advise.

## 2020-07-09 NOTE — Telephone Encounter (Signed)
Left voice mail for patient with instructions.

## 2020-07-10 LAB — RHEUMATOID FACTOR: Rheumatoid fact SerPl-aCnc: <14 IU/mL (ref <14)

## 2020-07-10 LAB — RPR: RPR Ser Ql: NONREACTIVE

## 2020-07-10 LAB — PTH, INTACT AND CALCIUM
Calcium: 10 mg/dL (ref 8.6–10.4)
PTH: 18 pg/mL (ref 14–64)

## 2020-07-10 LAB — CYCLIC CITRUL PEPTIDE ANTIBODY, IGG: Cyclic Citrullin Peptide Ab: <16 UNITS

## 2020-07-10 LAB — ANA: Anti Nuclear Antibody (ANA): NEGATIVE

## 2020-07-10 LAB — CALCIUM, IONIZED: Calcium, Ion: 5.3 mg/dL (ref 4.8–5.6)

## 2020-07-12 ENCOUNTER — Encounter: Payer: Self-pay | Admitting: Family Medicine

## 2020-07-12 ENCOUNTER — Other Ambulatory Visit: Payer: Self-pay

## 2020-07-12 ENCOUNTER — Ambulatory Visit (INDEPENDENT_AMBULATORY_CARE_PROVIDER_SITE_OTHER): Payer: 59

## 2020-07-12 DIAGNOSIS — M5126 Other intervertebral disc displacement, lumbar region: Secondary | ICD-10-CM

## 2020-07-12 DIAGNOSIS — G8929 Other chronic pain: Secondary | ICD-10-CM

## 2020-07-12 DIAGNOSIS — M5136 Other intervertebral disc degeneration, lumbar region: Secondary | ICD-10-CM

## 2020-07-12 DIAGNOSIS — S22080A Wedge compression fracture of T11-T12 vertebra, initial encounter for closed fracture: Secondary | ICD-10-CM | POA: Diagnosis not present

## 2020-07-12 DIAGNOSIS — M545 Low back pain, unspecified: Secondary | ICD-10-CM

## 2020-07-12 DIAGNOSIS — S32010A Wedge compression fracture of first lumbar vertebra, initial encounter for closed fracture: Secondary | ICD-10-CM | POA: Diagnosis not present

## 2020-07-14 NOTE — Telephone Encounter (Signed)
Called Tedra Coupe who states that order needs be in Epic, unable to accept verbal order.

## 2020-07-14 NOTE — Telephone Encounter (Signed)
Okay to give orders as requested;  

## 2020-07-15 ENCOUNTER — Other Ambulatory Visit: Payer: Self-pay | Admitting: Family

## 2020-07-15 ENCOUNTER — Other Ambulatory Visit: Payer: Self-pay

## 2020-07-15 ENCOUNTER — Telehealth: Payer: Self-pay

## 2020-07-15 DIAGNOSIS — R42 Dizziness and giddiness: Secondary | ICD-10-CM

## 2020-07-15 DIAGNOSIS — M5416 Radiculopathy, lumbar region: Secondary | ICD-10-CM

## 2020-07-15 NOTE — Telephone Encounter (Signed)
Patient called thinking we did Physical therapy in out office informed her that we do not, but I could ask who Dr. Tamala Julian would suggest for patient and get a referral placed. Patient wanting to be referred for physical therapy and called back with the location and number of the place we are referring her to.

## 2020-07-15 NOTE — Telephone Encounter (Signed)
Referral has been faxed.

## 2020-07-15 NOTE — Telephone Encounter (Signed)
Order placed. Patient notified.  

## 2020-07-15 NOTE — Telephone Encounter (Signed)
Can you place a physical therapy referral and put vestibular rehab in comments, please :) Also, the rehab center is a part of [facility] so I will need to fax it.  Thanks Cecille Rubin

## 2020-07-15 NOTE — Telephone Encounter (Signed)
Okay- referral done for PT; sorry about that

## 2020-07-15 NOTE — Telephone Encounter (Signed)
I sent the referral in but this is the contact information for the requested place.

## 2020-07-17 ENCOUNTER — Other Ambulatory Visit: Payer: Self-pay | Admitting: Family

## 2020-07-17 ENCOUNTER — Telehealth: Payer: Self-pay | Admitting: Family Medicine

## 2020-07-17 NOTE — Telephone Encounter (Signed)
Patient called stating that she was referred to [facility] and they were supposed to order a bone density for her. She says that it has been over a week and she has not heard anything from them. She asked if you would willing to place the order for a bone density test for her?  Please advise.  If so, and if it is ordered through [facility], let me know and I can call her to schedule it.

## 2020-07-18 ENCOUNTER — Emergency Department (HOSPITAL_COMMUNITY)
Admission: EM | Admit: 2020-07-18 | Discharge: 2020-07-18 | Disposition: A | Discharge status: Home / Self Care | Payer: 59 | Attending: Emergency Medicine | Admitting: Emergency Medicine

## 2020-07-18 ENCOUNTER — Emergency Department (HOSPITAL_COMMUNITY): Payer: 59

## 2020-07-18 ENCOUNTER — Other Ambulatory Visit: Payer: Self-pay

## 2020-07-18 ENCOUNTER — Encounter (HOSPITAL_COMMUNITY): Payer: Self-pay | Admitting: Emergency Medicine

## 2020-07-18 DIAGNOSIS — Y92009 Unspecified place in unspecified non-institutional (private) residence as the place of occurrence of the external cause: Secondary | ICD-10-CM | POA: Diagnosis not present

## 2020-07-18 DIAGNOSIS — S3992XA Unspecified injury of lower back, initial encounter: Secondary | ICD-10-CM | POA: Diagnosis present

## 2020-07-18 DIAGNOSIS — S32020A Wedge compression fracture of second lumbar vertebra, initial encounter for closed fracture: Secondary | ICD-10-CM | POA: Diagnosis not present

## 2020-07-18 DIAGNOSIS — M545 Low back pain, unspecified: Secondary | ICD-10-CM

## 2020-07-18 DIAGNOSIS — Z85820 Personal history of malignant melanoma of skin: Secondary | ICD-10-CM | POA: Insufficient documentation

## 2020-07-18 DIAGNOSIS — X501XXA Overexertion from prolonged static or awkward postures, initial encounter: Secondary | ICD-10-CM | POA: Insufficient documentation

## 2020-07-18 MED ORDER — METHOCARBAMOL 1000 MG/10ML IJ SOLN
500.0000 mg | Freq: Once | INTRAMUSCULAR | Status: AC
  Administered 2020-07-18: 500 mg via INTRAMUSCULAR
  Filled 2020-07-18: qty 5

## 2020-07-18 MED ORDER — HYDROCODONE-ACETAMINOPHEN 5-325 MG PO TABS
2.0000 | ORAL_TABLET | ORAL | 0 refills | Status: DC | PRN
Start: 2020-07-18 — End: 2020-09-03

## 2020-07-18 MED ORDER — METHOCARBAMOL 500 MG PO TABS: 500.0000 mg | ORAL_TABLET | Freq: Two times a day (BID) | ORAL | 0 refills | Status: DC

## 2020-07-18 MED ORDER — HYDROMORPHONE HCL 1 MG/ML IJ SOLN
1.0000 mg | Freq: Once | INTRAMUSCULAR | Status: AC
  Administered 2020-07-18: 1 mg via INTRAMUSCULAR
  Filled 2020-07-18: qty 1

## 2020-07-18 MED ORDER — DIAZEPAM 5 MG PO TABS
5.0000 mg | ORAL_TABLET | Freq: Once | ORAL | Status: AC
  Administered 2020-07-18: 5 mg via ORAL
  Filled 2020-07-18: qty 1

## 2020-07-18 MED ORDER — OXYCODONE-ACETAMINOPHEN 5-325 MG PO TABS
1.0000 | ORAL_TABLET | ORAL | Status: DC | PRN
  Administered 2020-07-18: 1 via ORAL
  Filled 2020-07-18: qty 1

## 2020-07-18 MED ORDER — KETOROLAC TROMETHAMINE 60 MG/2ML IM SOLN
30.0000 mg | Freq: Once | INTRAMUSCULAR | Status: AC
  Administered 2020-07-18: 30 mg via INTRAMUSCULAR
  Filled 2020-07-18: qty 2

## 2020-07-18 NOTE — ED Notes (Signed)
Patient transported to X-ray 

## 2020-07-18 NOTE — ED Triage Notes (Addendum)
Pt to triage via [facility] from home.  Reports lower back pain that radiates to bilateral hips since yesterday.  Monday she was diagnosed with 2 compression fractures of T12 and S1 and a bulging disc.  This morning she took Flexeril and Ibuprofen without relief.  Pain initially started in December after having a syncopal episode while trying on clothes. Yesterday she fellt something "pop" in her back when she took a step and reports severe pain since then.  Lower abd pain and nausea with movement.  Pt placed in recliner by EMS.

## 2020-07-18 NOTE — ED Notes (Signed)
Pt ambulating to and from bathroom with minimal assistance from husband.

## 2020-07-18 NOTE — Discharge Instructions (Addendum)
Take ibuprofen 400-600mg  every 6 to 8 hours for 3 more days, then stop. Drink plenty of fluids. Take medication with food. You may take hydrocodone OR tylenol but not both at the same time as the hydrocodone contains tylenol too. You may take Robaxin starting this evening as needed for muscle spasms. It can be combined with pain medications and topical medications.

## 2020-07-18 NOTE — ED Provider Notes (Signed)
[place] EMERGENCY DEPARTMENT Provider Note  CSN: [id number] Arrival date & time: 07/18/20 2706    History Chief Complaint  Patient presents with  . Back Pain    HPI  Katherine Beard is a 60 y.o. female previously healthy reports several weeks of R lower back pain radiating into her R groin, but never down her leg. She was seen by PCP, sent for xrays initially which were neg and then for MRI about a week ago showing two subtle compression fractures. She has been taking medicines for her pain, using heat/cold. She saw Neurosurgery earlier this week who recommended PT but the earliest that could start is next month. She reports pain is most severe when she is trying to straighten up from bending. She reports yesterday morning she was walking up the stairs when she had sudden worsening of her pain, felt a pop in her back. Pain is R lower back and radiating to R groin. She denies any incontinence. She managed her pain through the day with voltaren gel, head and ice. She got up during the night and had another episode of sudden worsening that was so severe she ended up on the floor of her house. She took a muscle relaxer then but was still unable to get up or move around. She had a full lab panel done on 1/5 which was unremarkable.    Past Medical History:  Diagnosis Date  . Acne   . DIVERTICULITIS, HX OF   . Epicondylitis syndrome of elbow    RIGHT   . GERD (gastroesophageal reflux disease)   . Hepatic cyst 2015   per MRI  . HYPERGLYCEMIA   . HYPERLIPIDEMIA   . Hyperplastic colon polyp   . Iliotibial band syndrome of right side   . Liver cyst 2014  . Melanoma ([place]) 2014   Excised -clear margins  . OBESITY   . Palpitations   . Vitamin D deficiency     Past Surgical History:  Procedure Laterality Date  . COLECTOMY  09-16-03   sigmoid  . COLONOSCOPY  2014  . COLONOSCOPY  09/19/2019  . UMBILICAL LAPRPOSCOPY     AGE 55    Family History  Problem Relation Age of Onset  .  Diabetes Mother   . Hypertension Mother   . Heart disease Mother        pacemaker  . Heart disease Paternal Grandfather   . Breast cancer Maternal Aunt   . Colon cancer Neg Hx   . Colon polyps Neg Hx   . Esophageal cancer Neg Hx   . Rectal cancer Neg Hx   . Stomach cancer Neg Hx     Social History   Tobacco Use  . Smoking status: Never Smoker  . Smokeless tobacco: Never Used  Substance Use Topics  . Alcohol use: Yes    Alcohol/week: 1.0 standard drink    Types: 1 drink(s) per week    Comment: wine per week  . Drug use: No     Home Medications Prior to Admission medications   Medication Sig Start Date End Date Taking? Authorizing Provider  HYDROcodone-acetaminophen (NORCO/VICODIN) 5-325 MG tablet Take 2 tablets by mouth every 4 (four) hours as needed. 07/18/20  Yes Truddie Hidden, MD  methocarbamol (ROBAXIN) 500 MG tablet Take 1 tablet (500 mg total) by mouth 2 (two) times daily. 07/18/20  Yes Truddie Hidden, MD  Cholecalciferol (CVS D3) 125 MCG (5000 UT) capsule Take 5,000 Units by mouth daily.  [provider]  Cyanocobalamin 2500 MCG CHEW Chew 1 tablet by mouth daily.    [provider]  cyclobenzaprine (FLEXERIL) 10 MG tablet Take 1 tablet (10 mg total) by mouth 3 (three) times daily as needed for muscle spasms. 06/24/20   [name], FNP  gabapentin (NEURONTIN) 100 MG capsule Take 2 capsules (200 mg total) by mouth at bedtime. 07/08/20   Lyndal Pulley, DO  Glucosamine-Chondroit-Vit C-Mn (GLUCOSAMINE 1500 COMPLEX PO) Take 1 tablet by mouth daily.    [provider]  Magnesium Citrate 100 MG TABS Take 1 tablet by mouth daily.    [provider]  meloxicam (MOBIC) 15 MG tablet TAKE 1 TABLET (15 MG TOTAL) BY MOUTH DAILY. 07/17/20   [name], FNP  Multiple Vitamins-Minerals (CENTRUM ULTRA WOMENS PO) Take by mouth.    [provider]  Multiple Vitamins-Minerals (HAIR SKIN AND NAILS FORMULA) TABS Take  1 tablet by mouth daily.    [provider]  predniSONE (DELTASONE) 20 MG tablet Take 2 tablets (40 mg total) by mouth daily. 07/09/20   Lyndal Pulley, DO  triamcinolone cream (KENALOG) 0.1 % Apply 1 application topically 2 (two) times daily. 09/11/19   [name], FNP  Turmeric 500 MG CAPS Take 1 tablet by mouth daily.    [provider]  valACYclovir (VALTREX) 500 MG tablet TAKE 1 TABLET BY MOUTH EVERY DAY 06/08/20   [name], FNP  Wheat Dextrin (BENEFIBER PO) Take 1 Dose by mouth daily.    [provider]     Allergies    Penicillins   Review of Systems   Review of Systems A comprehensive review of systems was completed and negative except as noted in HPI.    Physical Exam BP 104/66 (BP Location: Right Arm)   Pulse 74   Temp 97.8 F (36.6 C) (Oral)   Resp 16   LMP 03/06/2011   SpO2 100%   Physical Exam Vitals and nursing note reviewed.  Constitutional:      Appearance: Normal appearance.  HENT:     Head: Normocephalic and atraumatic.     Nose: Nose normal.     Mouth/Throat:     Mouth: Mucous membranes are moist.  Eyes:     Extraocular Movements: Extraocular movements intact.     Conjunctiva/sclera: Conjunctivae normal.  Cardiovascular:     Rate and Rhythm: Normal rate.  Pulmonary:     Effort: Pulmonary effort is normal.     Breath sounds: Normal breath sounds.  Abdominal:     General: Abdomen is flat.     Palpations: Abdomen is soft.     Tenderness: There is no abdominal tenderness. There is no guarding.  Musculoskeletal:        General: Tenderness (R lumbar paraspinal muscles) present. No swelling. Normal range of motion.     Cervical back: Neck supple.     Comments: No tenderness at R sciatic notch  Skin:    General: Skin is warm and dry.  Neurological:     General: No focal deficit present.     Mental Status: She is alert.     Comments: Normal strength and sensation in BLE. Neg straight leg raise   Psychiatric:        Mood and Affect: Mood normal.      ED Results / Procedures / Treatments   Labs (all labs ordered are listed, but only abnormal results are displayed) Labs Reviewed - No data to display  EKG None  Radiology DG Lumbar Spine Complete  Result Date: 07/18/2020 CLINICAL DATA:  Low back pain. Recent MRI shows compression fracture lumbar spine. Patient felt pop in low back yesterday while walking. EXAM: LUMBAR SPINE - COMPLETE 4+ VIEW COMPARISON:  06/24/2020 and MRI 07/12/2020 FINDINGS: Vertebral body alignment is normal. There is mild spondylosis throughout the lumbar spine. Disc spaces are maintained. Facet arthropathy is present over the lower lumbar spine. Mild loss of height of T12 and L1 compatible recent MRI findings of mild compression fractures at these levels. Slight interval worsening loss of height of L1 and L2 compared to the prior plain film 06/22/2020. Remainder the exam is unchanged. IMPRESSION: Mild spondylosis of the lumbar spine. Mild compression fractures of T12 and L1 as seen on recent MRI. Slight interval worsening loss of height of L1 and L2 compared to the prior plain films 06/22/2020 suggesting mild interval worsening of the L1 compression fracture and possible new mild compression deformity of L2. Electronically Signed   By: Marin Olp M.D.   On: 07/18/2020 13:19    Procedures Procedures  Medications Ordered in the ED Medications  ketorolac (TORADOL) injection 30 mg (30 mg Intramuscular Given 07/18/20 1250)  HYDROmorphone (DILAUDID) injection 1 mg (1 mg Intramuscular Given 07/18/20 1250)  diazepam (VALIUM) tablet 5 mg (5 mg Oral Given 07/18/20 1508)  methocarbamol (ROBAXIN) injection 500 mg (500 mg Intramuscular Given 07/18/20 1508)     MDM Rules/Calculators/A&P MDM Patient given oral percocet on arrival with some improvement, still unable to sit up in bed. Will give additional IM meds and send for xray to eval compression fractures.   ED  Course  I have reviewed the triage vital signs and the nursing notes.  Pertinent labs & imaging results that were available during my care of the patient were reviewed by me and considered in my medical decision making (see chart for details).  Clinical Course as of 07/19/20 2706  Sat Jul 18, 2020  1338 Xray images and results reviewed, mild worsening of previous compression fx and evidence of new L2 compression seen.  [CS]  2376 Xray results reviewed with the patient. She thinks she is feeling better but still lying supine so unsure about sitting/standing. I've asked the RN to gradually mobilize her as tolerated.  [CS]  2831 Patient still experiencing significant lower back spasms when trying to sit up. Will give Valium and Robaxin and reassess.  [CS]  5176 Care of the patient will be signed out to Dr. Rex Kras at shift change pending mobilization and hopefully discharge.  [CS]    Clinical Course User Index [CS] Truddie Hidden, MD    Final Clinical Impression(s) / ED Diagnoses Final diagnoses:  Acute right-sided low back pain without sciatica  Compression fracture of L2 vertebra, initial encounter [facility])    Rx / DC Orders ED Discharge Orders         Ordered    HYDROcodone-acetaminophen (NORCO/VICODIN) 5-325 MG tablet  Every 4 hours PRN        07/18/20 1457    methocarbamol (ROBAXIN) 500 MG tablet  2 times daily        07/18/20 1457           Truddie Hidden, MD 07/19/20 [phone]

## 2020-07-18 NOTE — ED Provider Notes (Signed)
I received this patient in signout from Dr. Karle Starch.  Briefly, she had presented with acute worsening of back pain with known compression fractures on recent MRI.  At time of signout, imaging completed and patient had received medications, pending ambulation and better pain control prior to discharge.  On reassessment, patient was ambulatory and had had some improvement.  Dr. Karle Starch has sent hydrocodone and Robaxin to her pharmacy.  I had a long discussion with the patient and her husband regarding medication regimen, supportive measures, and follow-up.  She and husband voiced understanding of plan.   Kaylianna Detert, Wenda Overland, MD 07/18/20 [phone]

## 2020-07-20 NOTE — Telephone Encounter (Signed)
I am willing to order but then follow up if osteoporosis would be with nuerosurgery or will be referred to endocrinology

## 2020-07-21 NOTE — Telephone Encounter (Signed)
Patient was seen in the ED this weekend and is now seeing a spine specialist. She is going to discuss this with them and follow up as needed.

## 2020-07-28 ENCOUNTER — Encounter: Payer: Self-pay | Admitting: Family

## 2020-08-04 ENCOUNTER — Ambulatory Visit: Payer: 59

## 2020-08-06 ENCOUNTER — Encounter: Payer: Self-pay | Admitting: Family

## 2020-08-10 ENCOUNTER — Other Ambulatory Visit: Payer: Self-pay | Admitting: Family

## 2020-08-14 ENCOUNTER — Other Ambulatory Visit: Payer: Self-pay | Admitting: Family

## 2020-08-14 ENCOUNTER — Telehealth: Payer: Self-pay | Admitting: Family

## 2020-08-14 DIAGNOSIS — M8000XS Age-related osteoporosis with current pathological fracture, unspecified site, sequela: Secondary | ICD-10-CM

## 2020-08-14 NOTE — Telephone Encounter (Signed)
Bone density does indicate osteoporosis; based on her young age and apparently sudden change in the DEXA results- she had indicated that GYN was monitoring with no concerns in the past- I want her to see rheumatology to discuss treatment options and management.

## 2020-08-17 NOTE — Telephone Encounter (Signed)
LVM instructing pt to call back for message from FNP.  Will also send mychart message.

## 2020-08-18 ENCOUNTER — Ambulatory Visit: Payer: 59

## 2020-08-18 NOTE — Telephone Encounter (Signed)
Mychart message Last read by Paulla Dolly at 4:22 PM on 08/17/2020.  LVM instructing pt to call back if any questions.

## 2020-09-02 ENCOUNTER — Telehealth: Payer: Self-pay | Admitting: *Deleted

## 2020-09-02 NOTE — Telephone Encounter (Signed)
Referral from Dr. Erline Levine to see Dr. Benay Spice asap. Offered her 3/3 at 1030 or 2:45 and she prefers 1030. Instructed patient to arrive 15 minutes early. She is aware of office location. Notified new patient coordinator to schedule the appointment.

## 2020-09-03 ENCOUNTER — Inpatient Hospital Stay: Payer: 59

## 2020-09-03 ENCOUNTER — Ambulatory Visit
Admission: RE | Admit: 2020-09-03 | Discharge: 2020-09-03 | Disposition: A | Discharge status: Home / Self Care | Payer: Self-pay | Source: Ambulatory Visit | Attending: Oncology | Admitting: Oncology

## 2020-09-03 ENCOUNTER — Other Ambulatory Visit: Payer: Self-pay

## 2020-09-03 ENCOUNTER — Inpatient Hospital Stay: Payer: 59 | Attending: Oncology | Admitting: Oncology

## 2020-09-03 ENCOUNTER — Other Ambulatory Visit: Payer: Self-pay | Admitting: Student

## 2020-09-03 ENCOUNTER — Ambulatory Visit (HOSPITAL_COMMUNITY)
Admission: RE | Admit: 2020-09-03 | Discharge: 2020-09-03 | Disposition: A | Discharge status: Home / Self Care | Payer: 59 | Source: Ambulatory Visit | Attending: Oncology | Admitting: Oncology

## 2020-09-03 VITALS — BP 123/66 | HR 90 | Temp 97.8°F | Resp 20 | Ht 65.0 in | Wt 161.5 lb

## 2020-09-03 DIAGNOSIS — Z85820 Personal history of malignant melanoma of skin: Secondary | ICD-10-CM | POA: Insufficient documentation

## 2020-09-03 DIAGNOSIS — C9000 Multiple myeloma not having achieved remission: Secondary | ICD-10-CM

## 2020-09-03 DIAGNOSIS — M549 Dorsalgia, unspecified: Secondary | ICD-10-CM | POA: Insufficient documentation

## 2020-09-03 DIAGNOSIS — Z5111 Encounter for antineoplastic chemotherapy: Secondary | ICD-10-CM | POA: Diagnosis not present

## 2020-09-03 DIAGNOSIS — Z79899 Other long term (current) drug therapy: Secondary | ICD-10-CM | POA: Diagnosis not present

## 2020-09-03 DIAGNOSIS — Z5112 Encounter for antineoplastic immunotherapy: Secondary | ICD-10-CM | POA: Insufficient documentation

## 2020-09-03 DIAGNOSIS — M810 Age-related osteoporosis without current pathological fracture: Secondary | ICD-10-CM | POA: Diagnosis not present

## 2020-09-03 DIAGNOSIS — Z23 Encounter for immunization: Secondary | ICD-10-CM

## 2020-09-03 NOTE — Addendum Note (Signed)
Addended by: Tania Ade on: 09/03/2020 06:16 PM   Modules accepted: Orders

## 2020-09-03 NOTE — Progress Notes (Signed)
[facility] New Patient Consult   Requesting MD: Tanika, Bracco, [place] [employer],   [id number]   AMIAYA Beard 60 y.o.  December 20, 1960    Reason for Consult: Back pain, compression fractures and bone lesions   HPI: Katherine Beard reports being in excellent health.  She exercises regularly.  She had an episode of vertigo while in a dressing room in December and noticed a "pop "in her back when she stumbled.  She had progressive low back pain over the next several weeks.  She saw her primary provider.  A plain x-ray of the lumbar spine on June 24, 2020 revealed no acute change. The pain worsened and she was referred for an MRI of the lumbar spine on July 12, 2020.  This revealed subacute compression fractures at T12 and L1.  A focal round edema signal at the endplate corner of G40 was felt to represent a Schmorl's node.  She had a fall on July 17, 2020 and presented to the emergency room on July 18, 2020 with increased low back pain.  She was evaluated by neurosurgery.  Another MRI of the lumbar spine on July 30, 2020 revealed the T12 and L1 compression fractures with a new subacute L2 endplate deformity.  Katherine Beard was referred to Dr. Vertell Limber.  An MRI of the lumbar spine on September 01, 2020 revealed new or progressive compression fractures at L2 and L3.  Evolution of previous identified fractures at T12 and L1.  An MRI of the pelvis on September 01, 2020 was remarkable for innumerable small foci of signal abnormality throughout the pelvis and both femurs concerning for multiple myeloma or metastatic disease.  She saw Dr. Vertell Limber this week.  She was placed in a back brace.  She continues to have severe low back pain that limits her ability to perform her daily activities and ambulate.  She is taking 5 mg of oxycodone for relief of pain.  Katherine Beard has been evaluated by Dr. Chalmers Cater for osteoporosis diagnosed when she presented with compression fractures.  She  is using calcitonin nasal spray.  Past Medical History:  Diagnosis Date  . Acne   . DIVERTICULITIS, HX OF   . Epicondylitis syndrome of elbow    RIGHT   . GERD (gastroesophageal reflux disease)   . Hepatic cyst 2015   per MRI  . HYPERGLYCEMIA   . HYPERLIPIDEMIA   . Hyperplastic colon polyp   . Iliotibial band syndrome of right side   . Liver cyst 2014  . Melanoma ([place]) 2014   Excised -clear margins  . OBESITY   . Palpitations   . Vitamin D deficiency     .  G2 P2   .  Osteoporosis  Past Surgical History:  Procedure Laterality Date  . COLECTOMY  09-16-03   sigmoid  . COLONOSCOPY  2014  . COLONOSCOPY  09/19/2019  . UMBILICAL LAPRPOSCOPY     AGE 28    Medications: Reviewed  Allergies:  Allergies  Allergen Reactions  . Penicillins Hives    REACTION: urticaria (hives)    Family history: Maternal aunt had breast cancer  Social History:   [place]  She works in Biomedical engineer.  She does not use cigarettes.  She reports social alcohol use.  No transfusion history.  No risk factor for HIV or hepatitis.  She has received 2 doses of the COVID-19 vaccine.  ROS:   Positives include: Intermittent dyspnea while  talking, pain between the shoulder blades for the past few days, severe pain at the central and left lower back, left leg "weakness "due to pain with movement, she sees "white circles "at the lateral aspect of both visual fields when her eyes are closed, her feet have been cold and sweating recently  A complete ROS was otherwise negative.  Physical Exam:  Blood pressure 123/66, pulse 90, temperature 97.8 F (36.6 C), temperature source Tympanic, resp. rate 20, height 5' 5"  (1.651 m), weight 161 lb 8 oz (73.3 kg), last menstrual period 03/06/2011, SpO2 100 %.  HEENT: Neck without mass Lungs: Clear bilaterally Cardiac: Regular rate and rhythm Abdomen: No hepatosplenomegaly, nontender, no mass  Vascular: No leg edema Lymph nodes:  No cervical, supraclavicular, axillary, or inguinal nodes Neurologic: Alert and oriented, the motor exam appears intact in the upper extremities bilaterally, 4/5 strength versus guarding with hip flexion bilaterally Skin: No rash Musculoskeletal: Tender at the sacrum and the left posterior iliac   LAB:  CBC  Lab Results  Component Value Date   WBC 4.7 07/08/2020   HGB 12.4 07/08/2020   HCT 35.6 (L) 07/08/2020   MCV 97.3 07/08/2020   PLT 251.0 07/08/2020   NEUTROABS 2.6 07/08/2020        CMP  Lab Results  Component Value Date   NA 138 07/08/2020   K 3.8 07/08/2020   CL 101 07/08/2020   CO2 31 07/08/2020   GLUCOSE 100 (H) 07/08/2020   BUN 28 (H) 07/08/2020   CREATININE 1.19 07/08/2020   CALCIUM 10.0 07/08/2020   CALCIUM 9.8 07/08/2020   PROT 6.6 07/08/2020   ALBUMIN 4.6 07/08/2020   AST 16 07/08/2020   ALT 16 07/08/2020   ALKPHOS 63 07/08/2020   BILITOT 0.3 07/08/2020   GFRNONAA 63.49 04/03/2009   GFRAA 78 12/11/2006   September 01, 2020: BUN 14, creatinine 0.8, calcium 10.2, ionized calcium 5.6 (4.5-5.6) PTH-14 (15-65) Serum protein electrophoresis-0.1 g M spike     Imaging: As per HPI   Assessment/Plan:   1. Severe back pain  MRI lumbar spine July 12, 2020-subacute compression fractures at T12 and L1  MRI lumbar spine July 30, 2020-subacute T12 and L1 compression fractures, new subacute superior L2 endplate deformity  MRI lumbar spine September 01, 2020 new/progressive compression fractures at L2 and L3, acute compression fracture at L4, evolution of T12 and L1 compression fractures  MRI pelvis September 01, 2020-innumerable small foci of signal abnormality in the pelvis and femurs, no fracture identified, concerning for multiple myeloma versus metastases 2. Melanoma left upper back 2014 3. History of diverticulitis 4. Mild hypercalcemia 5. Osteoporosis    Disposition:   Katherine Beard presents with progressive low back/pelvic pain over the past 3 months.   Imaging studies have identified multiple compression fractures in the lumbar spine and "lesions "in the pelvis concerning for multiple myeloma.  A low level serum M spike was identified on a serum protein electrophoresis and the calcium is mildly elevated.  The clinical presentation is consistent with a diagnosis of multiple myeloma.  She will undergo additional diagnostic evaluation to include lab studies for serum free light chains, serum immunofixation, and a metastatic bone survey.  She will undergo a diagnostic bone marrow biopsy on September 04, 2020.  Ms. Yearick will return for an office visit on September 08, 2020.  I discussed the likely diagnosis of multiple myeloma with her including standard treatment with induction therapy followed by autologous stem cell therapy.  She is scheduling a  second opinion appointment with [name] at [facility].  I told her I will be glad to coordinate her care with the [facility] myeloma team.  We will begin systemic therapy as soon as possible if the bone marrow biopsy confirms a diagnosis of multiple myeloma.  She will also begin biphosphonate therapy.  [name], MD  09/03/2020, 1:38 PM

## 2020-09-03 NOTE — Progress Notes (Signed)
   Covid-19 Vaccination Clinic  Name:  Katherine Beard    MRN: [id number] DOB: 04-16-1961  09/03/2020  [name] was observed post Covid-19 immunization for 15 minutes without incident. She was provided with Vaccine Information Sheet and instruction to access the V-Safe system.   [name] was instructed to call 911 with any severe reactions post vaccine: Marland Kitchen Difficulty breathing  . Swelling of face and throat  . A fast heartbeat  . A bad rash all over body  . Dizziness and weakness   Immunizations Administered    Name Date Dose VIS Date Route   PFIZER Comrnaty(Gray TOP) Covid-19 Vaccine 09/03/2020 12:25 PM 0.3 mL 06/11/2020 Intramuscular   Manufacturer: [employer]   Lot: [id number]   NDC: [phone]

## 2020-09-03 NOTE — Progress Notes (Signed)
Patient presents with her husband, Antony Haste for new patient visit with probable diagnosis of multiple myeloma. They have two adult children out of the home. She works full time in office work for [employer]. When not in pain she enjoys biking and gardening. Started having significant back pain in December this year. Currently wears back brace for pain. She is requesting a referral for home health aid for 3/week for help with ADLs (dressing/bath). States her insurance company has coverage for #30 visits for home health aid. [place] is OK to pursue first.

## 2020-09-03 NOTE — Patient Instructions (Signed)
Provided printed and verbal information re: bone marrow biopsy tomorrow. Arrive in admitting by 0730 and NPO after midnight. Needs driver.

## 2020-09-04 ENCOUNTER — Ambulatory Visit
Admission: RE | Admit: 2020-09-04 | Discharge: 2020-09-04 | Disposition: A | Discharge status: Home / Self Care | Payer: Self-pay | Source: Ambulatory Visit | Attending: Oncology | Admitting: Oncology

## 2020-09-04 ENCOUNTER — Other Ambulatory Visit: Payer: Self-pay | Admitting: Oncology

## 2020-09-04 ENCOUNTER — Other Ambulatory Visit: Payer: Self-pay

## 2020-09-04 ENCOUNTER — Encounter (HOSPITAL_COMMUNITY): Payer: Self-pay

## 2020-09-04 ENCOUNTER — Other Ambulatory Visit (HOSPITAL_COMMUNITY): Payer: Self-pay | Admitting: Oncology

## 2020-09-04 ENCOUNTER — Inpatient Hospital Stay
Admission: RE | Admit: 2020-09-04 | Discharge: 2020-09-04 | Disposition: A | Discharge status: Home / Self Care | Payer: Self-pay | Source: Ambulatory Visit | Attending: Oncology | Admitting: Oncology

## 2020-09-04 ENCOUNTER — Ambulatory Visit (HOSPITAL_COMMUNITY)
Admission: RE | Admit: 2020-09-04 | Discharge: 2020-09-04 | Disposition: A | Discharge status: Home / Self Care | Payer: 59 | Source: Ambulatory Visit | Attending: Oncology | Admitting: Oncology

## 2020-09-04 DIAGNOSIS — C9000 Multiple myeloma not having achieved remission: Secondary | ICD-10-CM

## 2020-09-04 DIAGNOSIS — D649 Anemia, unspecified: Secondary | ICD-10-CM | POA: Diagnosis not present

## 2020-09-04 DIAGNOSIS — C801 Malignant (primary) neoplasm, unspecified: Secondary | ICD-10-CM

## 2020-09-04 LAB — KAPPA/LAMBDA LIGHT CHAINS
Kappa free light chain: 6.2 mg/L (ref 3.3–19.4)
Kappa, lambda light chain ratio: 0.01 — ABNORMAL LOW (ref 0.26–1.65)
Lambda free light chains: 1123.5 mg/L — ABNORMAL HIGH (ref 5.7–26.3)

## 2020-09-04 LAB — CBC
HCT: 34.8 % — ABNORMAL LOW (ref 36.0–46.0)
Hemoglobin: 11.8 g/dL — ABNORMAL LOW (ref 12.0–15.0)
MCH: 33.1 pg (ref 26.0–34.0)
MCHC: 33.9 g/dL (ref 30.0–36.0)
MCV: 97.8 fL (ref 80.0–100.0)
Platelets: 249 10*3/uL (ref 150–400)
RBC: 3.56 MIL/uL — ABNORMAL LOW (ref 3.87–5.11)
RDW: 12.8 % (ref 11.5–15.5)
WBC: 5.6 10*3/uL (ref 4.0–10.5)
nRBC: 0 % (ref 0.0–0.2)

## 2020-09-04 LAB — PROTIME-INR
INR: 1 (ref 0.8–1.2)
Prothrombin Time: 13.1 seconds (ref 11.4–15.2)

## 2020-09-04 MED ORDER — METHOCARBAMOL 500 MG PO TABS
500.0000 mg | ORAL_TABLET | ORAL | Status: AC
  Administered 2020-09-04: 500 mg via ORAL
  Filled 2020-09-04: qty 1

## 2020-09-04 MED ORDER — FENTANYL CITRATE (PF) 100 MCG/2ML IJ SOLN
INTRAMUSCULAR | Status: AC | PRN
  Administered 2020-09-04 (×4): 50 ug via INTRAVENOUS

## 2020-09-04 MED ORDER — MIDAZOLAM HCL 2 MG/2ML IJ SOLN
INTRAMUSCULAR | Status: AC | PRN
  Administered 2020-09-04: 1 mg via INTRAVENOUS
  Administered 2020-09-04: 2 mg via INTRAVENOUS

## 2020-09-04 MED ORDER — DIPHENHYDRAMINE HCL 50 MG/ML IJ SOLN
INTRAMUSCULAR | Status: AC
  Filled 2020-09-04: qty 1

## 2020-09-04 MED ORDER — LIDOCAINE-EPINEPHRINE 1 %-1:100000 IJ SOLN
INTRAMUSCULAR | Status: AC | PRN
  Administered 2020-09-04: 10 mL

## 2020-09-04 MED ORDER — SODIUM CHLORIDE 0.9 % IV SOLN: INTRAVENOUS | Status: DC

## 2020-09-04 MED ORDER — MIDAZOLAM HCL 2 MG/2ML IJ SOLN
INTRAMUSCULAR | Status: AC
  Filled 2020-09-04: qty 6

## 2020-09-04 MED ORDER — FENTANYL CITRATE (PF) 100 MCG/2ML IJ SOLN
INTRAMUSCULAR | Status: AC
  Filled 2020-09-04: qty 4

## 2020-09-04 MED ORDER — DIPHENHYDRAMINE HCL 50 MG/ML IJ SOLN
INTRAMUSCULAR | Status: AC | PRN
  Administered 2020-09-04: 25 mg via INTRAVENOUS

## 2020-09-04 MED ORDER — METHOCARBAMOL 1000 MG/10ML IJ SOLN: 500.0000 mg | INTRAVENOUS | Status: DC

## 2020-09-04 NOTE — Consult Note (Signed)
Chief Complaint: Patient was seen in consultation today for CT-guided bone marrow biopsy  Referring Physician(s): Sherrill,B  Supervising Physician: Mir, Sharen Heck  Patient Status: [facility] - Out-pt  History of Present Illness: Katherine Beard is a 60 y.o. female with history of progressive low back/ pelvic pain over the past 3 months with imaging studies revealing multiple compression fractures in the lumbar spine and lesions in the pelvis concerning for multiple myeloma.  Patient also has a low level serum M spike and mildly elevated calcium levels.  She presents today for CT-guided bone marrow biopsy for further evaluation.  Additional medical history as below.  Past Medical History:  Diagnosis Date  . Acne   . DIVERTICULITIS, HX OF   . Epicondylitis syndrome of elbow    RIGHT   . GERD (gastroesophageal reflux disease)   . Hepatic cyst 2015   per MRI  . HYPERGLYCEMIA   . HYPERLIPIDEMIA   . Hyperplastic colon polyp   . Iliotibial band syndrome of right side   . Liver cyst 2014  . Melanoma ([place]) 2014   Excised -clear margins  . OBESITY   . Palpitations   . Vitamin D deficiency     Past Surgical History:  Procedure Laterality Date  . COLECTOMY  09-16-03   sigmoid  . COLONOSCOPY  2014  . COLONOSCOPY  09/19/2019  . UMBILICAL LAPRPOSCOPY     AGE 77    Allergies: Penicillins  Medications: Prior to Admission medications   Medication Sig Start Date End Date Taking? Authorizing Provider  calcitonin, salmon, (MIACALCIN/FORTICAL) 200 UNIT/ACT nasal spray Place 1 spray into alternate nostrils daily. 08/26/20   [provider]  Cyanocobalamin 2500 MCG CHEW Chew 1 tablet by mouth daily. Patient not taking: Reported on 09/03/2020    [provider]  diazepam (VALIUM) 5 MG tablet Take 5 mg by mouth 2 (two) times daily as needed. 08/26/20   [provider]  Ibuprofen 200 MG CAPS Take 600 mg by mouth as needed.    [provider]  methocarbamol  (ROBAXIN) 500 MG tablet Take 1 tablet (500 mg total) by mouth 2 (two) times daily. 07/18/20   Truddie Hidden, MD  oxyCODONE (OXY IR/ROXICODONE) 5 MG immediate release tablet Take 1-2 tablets by mouth every 4 (four) hours as needed. 09/02/20   [provider]  Teriparatide, Recombinant, 620 MCG/2.48ML SOPN daily. Patient not taking: Reported on 09/03/2020 08/26/20   [provider]  Wheat Dextrin (BENEFIBER PO) Take 1 Dose by mouth daily.    [provider]     Family History  Problem Relation Age of Onset  . Diabetes Mother   . Hypertension Mother   . Heart disease Mother        pacemaker  . Heart disease Paternal Grandfather   . Breast cancer Maternal Aunt   . Colon cancer Neg Hx   . Colon polyps Neg Hx   . Esophageal cancer Neg Hx   . Rectal cancer Neg Hx   . Stomach cancer Neg Hx     Social History   Socioeconomic History  . Marital status: Married    Spouse name: Not on file  . Number of children: 2  . Years of education: Not on file  . Highest education level: Not on file  Occupational History  . Occupation: radio advertising  Tobacco Use  . Smoking status: Never Smoker  . Smokeless tobacco: Never Used  Substance and Sexual Activity  . Alcohol use: Yes  Alcohol/week: 1.0 standard drink    Types: 1 drink(s) per week    Comment: wine per week  . Drug use: No  . Sexual activity: Not on file  Other Topics Concern  . Not on file  Social History Narrative  . Not on file   Social Determinants of Health   Financial Resource Strain: Not on file  Food Insecurity: Not on file  Transportation Needs: Not on file  Physical Activity: Not on file  Stress: Not on file  Social Connections: Not on file      Review of Systems denies fever, headache, chest pain, dyspnea, cough, or abdominal pain, nausea, vomiting or bleeding.  She does have low back pain, primarily left-sided and left hip pain.  Vital Signs: Blood pressure 122/70, heart rate 69,  temperature 98.6, respirations 18, O2 sat 100% room air  LMP 03/06/2011   Physical Exam awake, alert.  Chest clear to auscultation bilaterally.  Heart with regular rate and rhythm.  Abdomen soft, positive bowel sounds, nontender.  No lower extremity edema.  Imaging: No results found.  Labs:  CBC: Recent Labs    07/08/20 1509 09/04/20 0819  WBC 4.7 5.6  HGB 12.4 11.8*  HCT 35.6* 34.8*  PLT 251.0 249    COAGS: No results for input(s): INR, APTT in the last 8760 hours.  BMP: Recent Labs    09/12/19 0847 07/08/20 1509  NA 140 138  K 4.1 3.8  CL 104 101  CO2 30 31  GLUCOSE 95 100*  BUN 21 28*  CALCIUM 9.5 10.0  9.8  CREATININE 0.83 1.19    LIVER FUNCTION TESTS: Recent Labs    09/12/19 0847 07/08/20 1509  BILITOT 0.5 0.3  AST 15 16  ALT 14 16  ALKPHOS 52 63  PROT 6.1 6.6  ALBUMIN 4.1 4.6    TUMOR MARKERS: No results for input(s): AFPTM, CEA, CA199, CHROMGRNA in the last 8760 hours.  Assessment and Plan: 60 y.o. female with history of progressive low back/ pelvic pain over the past 3 months with imaging studies revealing multiple compression fractures in the lumbar spine and lesions in the pelvis concerning for multiple myeloma.  Patient also has a low level serum M spike and mildly elevated calcium levels.  She presents today for CT-guided bone marrow biopsy for further evaluation.Risks and benefits of procedure was discussed with the patient  including, but not limited to bleeding, infection, damage to adjacent structures or low yield requiring additional tests.  All of the questions were answered and there is agreement to proceed.  Consent signed and in chart.     Thank you for this interesting consult.  I greatly enjoyed meeting Katherine Beard and look forward to participating in their care.  A copy of this report was sent to the requesting provider on this date.  Electronically Signed: D. Rowe Robert, PA-C 09/04/2020, 8:33 AM   I spent a total of  20 minutes  in face to face in clinical consultation, greater than 50% of which was counseling/coordinating care for CT-guided bone marrow biopsy

## 2020-09-04 NOTE — Discharge Instructions (Signed)
Please call Interventional Radiology clinic [phone] with any questions or concerns.  You may remove your dressing and shower tomorrow.   Bone Marrow Aspiration and Bone Marrow Biopsy, Adult, Care After This sheet gives you information about how to care for yourself after your procedure. Your health care provider may also give you more specific instructions. If you have problems or questions, contact your health care provider. What can I expect after the procedure? After the procedure, it is common to have:  Mild pain and tenderness.  Swelling.  Bruising. Follow these instructions at home: Puncture site care  Follow instructions from your health care provider about how to take care of the puncture site. Make sure you: ? Wash your hands with soap and water before and after you change your bandage (dressing). If soap and water are not available, use hand sanitizer. ? Change your dressing as told by your health care provider.  Check your puncture site every day for signs of infection. Check for: ? More redness, swelling, or pain. ? Fluid or blood. ? Warmth. ? Pus or a bad smell.   Activity  Return to your normal activities as told by your health care provider. Ask your health care provider what activities are safe for you.  Do not lift anything that is heavier than 10 lb (4.5 kg), or the limit that you are told, until your health care provider says that it is safe.  Do not drive for 24 hours if you were given a sedative during your procedure. General instructions  Take over-the-counter and prescription medicines only as told by your health care provider.  Do not take baths, swim, or use a hot tub until your health care provider approves. Ask your health care provider if you may take showers. You may only be allowed to take sponge baths.  If directed, put ice on the affected area. To do this: ? Put ice in a plastic bag. ? Place a towel between your skin and the bag. ? Leave  the ice on for 20 minutes, 2-3 times a day.  Keep all follow-up visits as told by your health care provider. This is important.   Contact a health care provider if:  Your pain is not controlled with medicine.  You have a fever.  You have more redness, swelling, or pain around the puncture site.  You have fluid or blood coming from the puncture site.  Your puncture site feels warm to the touch.  You have pus or a bad smell coming from the puncture site. Summary  After the procedure, it is common to have mild pain, tenderness, swelling, and bruising.  Follow instructions from your health care provider about how to take care of the puncture site and what activities are safe for you.  Take over-the-counter and prescription medicines only as told by your health care provider.  Contact a health care provider if you have any signs of infection, such as fluid or blood coming from the puncture site. This information is not intended to replace advice given to you by your health care provider. Make sure you discuss any questions you have with your health care provider. Document Revised: 11/06/2018 Document Reviewed: 11/06/2018 Elsevier Patient Education  2021 Elsevier Inc.   Moderate Conscious Sedation, Adult, Care After This sheet gives you information about how to care for yourself after your procedure. Your health care provider may also give you more specific instructions. If you have problems or questions, contact your health care provider. What   can I expect after the procedure? After the procedure, it is common to have:  Sleepiness for several hours.  Impaired judgment for several hours.  Difficulty with balance.  Vomiting if you eat too soon. Follow these instructions at home: For the time period you were told by your health care provider:  Rest.  Do not participate in activities where you could fall or become injured.  Do not drive or use machinery.  Do not drink  alcohol.  Do not take sleeping pills or medicines that cause drowsiness.  Do not make important decisions or sign legal documents.  Do not take care of children on your own.      Eating and drinking  Follow the diet recommended by your health care provider.  Drink enough fluid to keep your urine pale yellow.  If you vomit: ? Drink water, juice, or soup when you can drink without vomiting. ? Make sure you have little or no nausea before eating solid foods.   General instructions  Take over-the-counter and prescription medicines only as told by your health care provider.  Have a responsible adult stay with you for the time you are told. It is important to have someone help care for you until you are awake and alert.  Do not smoke.  Keep all follow-up visits as told by your health care provider. This is important. Contact a health care provider if:  You are still sleepy or having trouble with balance after 24 hours.  You feel light-headed.  You keep feeling nauseous or you keep vomiting.  You develop a rash.  You have a fever.  You have redness or swelling around the IV site. Get help right away if:  You have trouble breathing.  You have new-onset confusion at home. Summary  After the procedure, it is common to feel sleepy, have impaired judgment, or feel nauseous if you eat too soon.  Rest after you get home. Know the things you should not do after the procedure.  Follow the diet recommended by your health care provider and drink enough fluid to keep your urine pale yellow.  Get help right away if you have trouble breathing or new-onset confusion at home. This information is not intended to replace advice given to you by your health care provider. Make sure you discuss any questions you have with your health care provider. Document Revised: 10/18/2019 Document Reviewed: 05/16/2019 Elsevier Patient Education  2021 [employer].

## 2020-09-04 NOTE — Procedures (Signed)
Interventional Radiology Procedure Note  Procedure: Bone marrow biopsy  Indication: Back pain  Findings: Please refer to procedural dictation for full description.  Complications: None  EBL: < 10 mL  Miachel Roux, MD [phone]

## 2020-09-04 NOTE — Progress Notes (Signed)
RN and NT attempted to get patient dressed for dischargeafter bone marrow biopsy.  Patient began having severe low back spasms.  Patient states she is unable to move due to muscle spasms.  Rowe Robert, PA notified.

## 2020-09-08 ENCOUNTER — Inpatient Hospital Stay (HOSPITAL_BASED_OUTPATIENT_CLINIC_OR_DEPARTMENT_OTHER): Payer: 59 | Admitting: Oncology

## 2020-09-08 ENCOUNTER — Other Ambulatory Visit: Payer: Self-pay | Admitting: *Deleted

## 2020-09-08 ENCOUNTER — Inpatient Hospital Stay: Payer: 59

## 2020-09-08 ENCOUNTER — Other Ambulatory Visit: Payer: Self-pay

## 2020-09-08 ENCOUNTER — Encounter: Payer: Self-pay | Admitting: *Deleted

## 2020-09-08 VITALS — BP 122/67 | HR 78 | Temp 97.0°F | Resp 18 | Ht 65.0 in | Wt 159.6 lb

## 2020-09-08 DIAGNOSIS — C9000 Multiple myeloma not having achieved remission: Secondary | ICD-10-CM | POA: Diagnosis not present

## 2020-09-08 DIAGNOSIS — Z7189 Other specified counseling: Secondary | ICD-10-CM | POA: Diagnosis not present

## 2020-09-08 LAB — SURGICAL PATHOLOGY

## 2020-09-08 MED ORDER — ONDANSETRON HCL 8 MG PO TABS: 8.0000 mg | ORAL_TABLET | Freq: Three times a day (TID) | ORAL | 2 refills | Status: DC | PRN

## 2020-09-08 MED ORDER — LENALIDOMIDE 25 MG PO CAPS: 25.0000 mg | ORAL_CAPSULE | Freq: Every day | ORAL | 0 refills | Status: DC

## 2020-09-08 MED ORDER — PROCHLORPERAZINE MALEATE 10 MG PO TABS: 10.0000 mg | ORAL_TABLET | Freq: Four times a day (QID) | ORAL | 1 refills | Status: DC | PRN

## 2020-09-08 MED ORDER — ACYCLOVIR 400 MG PO TABS: 400.0000 mg | ORAL_TABLET | Freq: Two times a day (BID) | ORAL | 5 refills | Status: DC

## 2020-09-08 NOTE — Progress Notes (Signed)
[facility] Psychosocial Distress Screening Clinical Social Work  Clinical Social Work and [employer] were referred by distress screening protocol.  The patient scored a 10 on the Psychosocial Distress Thermometer which indicates severe distress.  [facility] will make first contact to patient to offer support and share support programs and resources at [facility].  [facility] will follow up with CSW if additional social work needs are identified.  CSW will continue to follow and support as needed.    [facility] DISTRESS SCREENING 09/03/2020  Screening Type Initial Screening  Distress experienced in past week (1-10) 10  Practical problem type Work/school  Family Problem type Other (comment)  Emotional problem type Nervousness/Anxiety;Adjusting to illness;Boredom  Physical Problem type Pain;Getting around;Bathing/dressing  Physician notified of physical symptoms Yes  Referral to clinical psychology No  Referral to clinical social work Yes  Referral to dietition No  Referral to financial advocate No  Other Referred to chaplain as well    Johnnye Lana, MSW, LCSW, OSW-C Clinical Social Worker [place] [phone]

## 2020-09-08 NOTE — Progress Notes (Signed)
START ON PATHWAY REGIMEN - Multiple Myeloma and Other Plasma Cell Dyscrasias     A cycle is every 21 days:     Bortezomib      Lenalidomide      Dexamethasone   **Always confirm dose/schedule in your pharmacy ordering system**  Patient Characteristics: Multiple Myeloma, Newly Diagnosed, Transplant Eligible, Unknown or Awaiting Test Results Disease Classification: Multiple Myeloma R-ISS Staging: Unknown Therapeutic Status: Newly Diagnosed Is Patient Eligible for Transplant<= Transplant Eligible Risk Status: Awaiting Test Results Intent of Therapy: Non-Curative / Palliative Intent, Discussed with Patient

## 2020-09-08 NOTE — Progress Notes (Signed)
[facility] OFFICE PROGRESS NOTE   Diagnosis: Multiple myeloma  INTERVAL HISTORY:   Katherine Beard underwent a diagnostic bone marrow biopsy on 09/04/2020.  She reports increased pain while on the exam table and following the procedure.  The pain has improved over the past several days.  She continues oxycodone, ibuprofen, and methocarbamol as needed.  She ambulates with a walker.  She feels as though her lower back and legs will give way if she does not hold the walker.  No difficulty with bowel or bladder control.  The pathology from the bone marrow biopsy revealed a diffuse increase in plasma cells compromising 70-80% of the marrow cellularity.  There are atypical plasma cells with visible nucleoli.  The plasma cells are lambda light chain restricted.  Cytogenetics and a myeloma FISH panel are pending.  Flow cytometry of the bone marrow aspirate is negative for monoclonal B-cell population. Objective:  Vital signs in last 24 hours:  Blood pressure 122/67, pulse 78, temperature (!) 97 F (36.1 C), temperature source Tympanic, resp. rate 18, height 5' 5"  (1.651 m), weight 159 lb 9.6 oz (72.4 kg), last menstrual period 03/06/2011, SpO2 100 %.     Neuro: The motor exam appears intact in the upper and lower extremities bilaterally.  Mild weakness with flexion at the hip bilaterally appears to be secondary to guarding from pain Skin: Bone marrow site without evidence of bleeding    Lab Results:  Lab Results  Component Value Date   WBC 5.6 09/04/2020   HGB 11.8 (L) 09/04/2020   HCT 34.8 (L) 09/04/2020   MCV 97.8 09/04/2020   PLT 249 09/04/2020   NEUTROABS 2.6 07/08/2020    CMP  Lab Results  Component Value Date   NA 138 07/08/2020   K 3.8 07/08/2020   CL 101 07/08/2020   CO2 31 07/08/2020   GLUCOSE 100 (H) 07/08/2020   BUN 28 (H) 07/08/2020   CREATININE 1.19 07/08/2020   CALCIUM 10.0 07/08/2020   CALCIUM 9.8 07/08/2020   PROT 6.6 07/08/2020   ALBUMIN 4.6  07/08/2020   AST 16 07/08/2020   ALT 16 07/08/2020   ALKPHOS 63 07/08/2020   BILITOT 0.3 07/08/2020   GFRNONAA 63.49 04/03/2009   GFRAA 78 12/11/2006   09/03/2020: Lambda free light chains 1123  Medications: I have reviewed the patient's current medications.   Assessment/Plan: 1.  Multiple myeloma  Bone marrow biopsy 09/04/2020-increased plasma cells (57% on the aspirate differential, 70-80% on the biopsy), lambda light chain restricted  Increase serum free lambda light chains  Serum M spike  MRI pelvis 09/01/2020-innumerable small foci of signal abnormality concerning for multiple myeloma involvement  Metastatic bone survey 09/03/2020-no discrete lytic bone lesions, compression fractures at T12, L1, L2, and L3, osteopenia  2. Severe back pain  MRI lumbar spine July 12, 2020-subacute compression fractures at T12 and L1  MRI lumbar spine July 30, 2020-subacute T12 and L1 compression fractures, new subacute superior L2 endplate deformity  MRI lumbar spine September 01, 2020 new/progressive compression fractures at L2 and L3, acute compression fracture at L4, evolution of T12 and L1 compression fractures  MRI pelvis September 01, 2020-innumerable small foci of signal abnormality in the pelvis and femurs, no fracture identified, concerning for multiple myeloma versus metastases 3. Melanoma left upper back 2014 4. History of diverticulitis 5. Mild hypercalcemia 6. Osteoporosis   Disposition: Ms. Bangert has been diagnosed with multiple myeloma.  I discussed the bone marrow findings with her.  There is a lambda  restricted plasma cytosis.  The serum free lambda light chains are increased.  A serum immunofixation is pending.  A myeloma FISH panel is pending  We discussed the prognosis and treatment options.  She appears transplant eligible.  She requests a second opinion with [name] and the transplant team at [facility].  I discussed the standard first-line treatment for patients with multiple  myeloma who are transplant eligible.  I recommend VRD.  We reviewed potential toxicities associated with this regimen including the chance of hematologic toxicity, infection, bleeding, neuropathy, nausea, diarrhea, thromboembolic disease, and rash.  We discussed the emotional lability, peptic ulcer disease, hyperglycemia, and decreased bone density associated with Decadron.  She agrees to proceed.  She will attend a chemotherapy teaching class.  The plan is to begin VRD as soon as she can obtain lenalidomide.  She was enrolled in the lenalidomide drug program today.  She will continue oxycodone as needed for pain.  We made a referral for home physical therapy.  Ms. Ende will return for an office visit on day 8 cycle 1 VRD.  [name], MD  09/08/2020  2:13 PM

## 2020-09-08 NOTE — Progress Notes (Signed)
Reviewed [employer] information and information on Revlimid w/patient and husband today. She will begin ASA 81 mg daily and acyclovir 400 mg bid. Revlimid authorization obtained and script faxed to [facility]. Also made referral to [facility] for personal care services. [facility] does not provide this service unless she has RN or PT/OT involved and this is not necessary per Dr. Benay Spice.

## 2020-09-09 ENCOUNTER — Encounter: Payer: Self-pay | Admitting: General Practice

## 2020-09-09 ENCOUNTER — Telehealth: Payer: Self-pay | Admitting: *Deleted

## 2020-09-09 ENCOUNTER — Telehealth: Payer: Self-pay | Admitting: Pharmacist

## 2020-09-09 DIAGNOSIS — C9000 Multiple myeloma not having achieved remission: Secondary | ICD-10-CM

## 2020-09-09 LAB — MULTIPLE MYELOMA PANEL, SERUM
Albumin SerPl Elph-Mcnc: 4 g/dL (ref 2.9–4.4)
Albumin/Glob SerPl: 1.7 (ref 0.7–1.7)
Alpha 1: 0.4 g/dL (ref 0.0–0.4)
Alpha2 Glob SerPl Elph-Mcnc: 0.9 g/dL (ref 0.4–1.0)
B-Globulin SerPl Elph-Mcnc: 1 g/dL (ref 0.7–1.3)
Gamma Glob SerPl Elph-Mcnc: 0.2 g/dL — ABNORMAL LOW (ref 0.4–1.8)
Globulin, Total: 2.5 g/dL (ref 2.2–3.9)
IgA: 9 mg/dL — ABNORMAL LOW (ref 87–352)
IgG (Immunoglobin G), Serum: 276 mg/dL — ABNORMAL LOW (ref 586–1602)
IgM (Immunoglobulin M), Srm: 6 mg/dL — ABNORMAL LOW (ref 26–217)
M Protein SerPl Elph-Mcnc: 0.1 g/dL — ABNORMAL HIGH
Total Protein ELP: 6.5 g/dL (ref 6.0–8.5)

## 2020-09-09 NOTE — Progress Notes (Signed)
[facility] Spiritual Care Note  Reached Ms Dudek by phone per referral from Manuela Schwartz Coward/RN. We plan to speak in more detail tomorrow morning around 11:00.   [place], [place], [facility] Pager [phone] Voicemail [phone]

## 2020-09-09 NOTE — Telephone Encounter (Addendum)
Oral Oncology Pharmacist Encounter  Received new prescription for Revlimid (lenalidomide) for the treatment of multiple myeloma in conjunction with Velcade and dexamethasone, planned for duration of induction therapy followed by transplant.  Prescription dose and frequency assessed for appropriateness. Appropriate for therapy initiation.   CBC w/ Diff and CMP from 07/08/20 assessed. Pts Scr at that time was 1.19 mg/dL - per MD patient has had repeat Scr at outside facility since then and Scr was 0.8 mg/dL (CrCl ~88 mL/min) - no renal dose adjustments required for Revlimid.   Current medication list in Epic reviewed, no relevant/significant DDIs with Revlimid identified.  Evaluated chart and no patient barriers to medication adherence noted.   Patient agreement for Revlimid treatment documented in MD note on 09/08/20.  Revlimid is required to be filled through [facility] due to limited distribution nature of medication and insurance requirements. Rx redirected to [facility] for dispensing.   Oral Oncology Clinic will continue to follow for insurance authorization, copayment issues, initial counseling and start date.  Leron Croak, PharmD, BCPS Hematology/Oncology Clinical Pharmacist [facility] [phone] 09/09/2020 8:13 AM

## 2020-09-09 NOTE — Telephone Encounter (Signed)
Faxed referral for PCS to [facility] at [phone]. Called patient and made her aware of the referral and provided her their phone # to call and follow up.

## 2020-09-10 ENCOUNTER — Telehealth: Payer: Self-pay

## 2020-09-10 ENCOUNTER — Encounter: Payer: Self-pay | Admitting: General Practice

## 2020-09-10 MED ORDER — LENALIDOMIDE 25 MG PO CAPS: 25.0000 mg | ORAL_CAPSULE | Freq: Every day | ORAL | 0 refills | Status: DC

## 2020-09-10 NOTE — Progress Notes (Signed)
[facility] Spiritual Care Note  Reached Katherine Beard by phone as planned and then phoned her back later in the day per her request, but missed her at our scheduled time. Left voicemail encouraging return call.   [place], [place], [facility] Pager [phone] Voicemail [phone]

## 2020-09-11 ENCOUNTER — Telehealth: Payer: Self-pay | Admitting: *Deleted

## 2020-09-11 MED ORDER — DIAZEPAM 5 MG PO TABS: 5.0000 mg | ORAL_TABLET | Freq: Two times a day (BID) | ORAL | 0 refills | Status: DC | PRN

## 2020-09-11 NOTE — Telephone Encounter (Signed)
Oral Chemotherapy Pharmacist Encounter  I spoke with patient for overview of: Revlimid for the treatment of multiple myeloma in conjunction with Velcade and dexamethasone, planned duration planned for duration of induction therapy followed by transplant.   Counseled patient on administration, dosing, side effects, monitoring, drug-food interactions, safe handling, storage, and disposal.  Patient will take Revlimid 16m capsules, 1 capsule by mouth once daily, without regard to food, with a full glass of water.  Revlimid will be given 14 days on, 7 days off, repeat every 21 days.  Patient will receive dexamethasone in clinic.  Revlimid start date: 09/14/20  Adverse effects of Revlimid include but are not limited to: nausea, constipation, diarrhea, abdominal pain, rash, fatigue, drug fever, and decreased blood counts.    Reviewed with patient importance of keeping a medication schedule and plan for any missed doses. No barriers to medication adherence identified.  Medication reconciliation performed and medication/allergy list updated.  Patient has picked up acyclovir. Patient counseled on importance of daily aspirin 869mfor VTE prophylaxis.  Insurance authorization for Revlimid has been obtained.  Revlimid prescription is being dispensed from [facility] it is a limited distribution medication. Medication will be delivered to patient's home on 09/12/20.  All questions answered.  Ms. LlChisholmoiced understanding and appreciation.   Will meet with patient in clinic on 09/14/20 to provide medication education handout as well as medication calendar. Patient knows to call the office with questions or concerns. Oral Chemotherapy Clinic phone number provided to patient.   ReLeron CroakPharmD, BCPS Hematology/Oncology Clinical Pharmacist [id number]/05/2021 3:41 PM

## 2020-09-11 NOTE — Telephone Encounter (Signed)
Left VM this morning asking for status of her Revlimid and if OK to continue to take her pain medications with the treatment regimen? @ 1340 Called back and left patient VM: OK to continue all pain meds until they are no longer needed. Provided the phone # [phone] option 3 to call to expedite the delivery. Will be OK if delivery is Monday. Per Wynn Maudlin, they were going to reach out to her today for a possible Saturday delivery.  Requested return call if she has more questions.

## 2020-09-11 NOTE — Addendum Note (Signed)
Addended by: Tania Ade on: 09/11/2020 05:11 PM   Modules accepted: Orders

## 2020-09-11 NOTE — Telephone Encounter (Signed)
Return call w/request to refill her oxycodone on Monday (has enough for weekend) and also needs refill on her valium. Asking if the final results of her bone marrow biopsy have returned and if MD can discuss w/her at [facility] treatment. Went to dentist yesterday and had cleaning and fluoride treatment. Did not do xrays.

## 2020-09-11 NOTE — Telephone Encounter (Signed)
Oral Oncology Patient Advocate Encounter  Received notification from [facility] that prior authorization for Revlimid is required.  PA submitted by fax to [phone] Status is pending  Oral Oncology Clinic will continue to follow.  [place] Patient [place] Phone [phone] Fax [phone] 09/11/2020 8:26 AM

## 2020-09-11 NOTE — Progress Notes (Signed)
Pharmacist Chemotherapy Monitoring - Initial Assessment    Anticipated start date: 09/14/20  Regimen:  . Are orders appropriate based on the patient's diagnosis, regimen, and cycle? Yes . Does the plan date match the patient's scheduled date? Yes . Is the sequencing of drugs appropriate? Yes . Are the premedications appropriate for the patient's regimen? Yes . Prior Authorization for treatment is: Pending o If applicable, is the correct biosimilar selected based on the patient's insurance? not applicable  Organ Function and Labs: Marland Kitchen Are dose adjustments needed based on the patient's renal function, hepatic function, or hematologic function? Yes . Are appropriate labs ordered prior to the start of patient's treatment? Yes . Other organ system assessment, if indicated: N/A . The following baseline labs, if indicated, have been ordered: N/A  Dose Assessment: . Are the drug doses appropriate? Yes . Are the following correct: o Drug concentrations Yes o IV fluid compatible with drug Yes o Administration routes Yes o Timing of therapy Yes . If applicable, does the patient have documented access for treatment and/or plans for port-a-cath placement? not applicable . If applicable, have lifetime cumulative doses been properly documented and assessed? not applicable Lifetime Dose Tracking  No doses have been documented on this patient for the following tracked chemicals: Doxorubicin, Epirubicin, Idarubicin, Daunorubicin, Mitoxantrone, Bleomycin, Oxaliplatin, Carboplatin, Liposomal Doxorubicin  o   Toxicity Monitoring/Prevention: . The patient has the following take home antiemetics prescribed: Prochlorperazine . The patient has the following take home medications prescribed: VZV prophylaxis . Medication allergies and previous infusion related reactions, if applicable, have been reviewed and addressed. Yes . The patient's current medication list has been assessed for drug-drug interactions with  their chemotherapy regimen. no significant drug-drug interactions were identified on review.  Order Review: . Are the treatment plan orders signed? No . Is the patient scheduled to see a provider prior to their treatment? No  I verify that I have reviewed each item in the above checklist and answered each question accordingly.  Philomena Course, RPH, 09/11/2020  10:59 AM

## 2020-09-11 NOTE — Telephone Encounter (Signed)
Oral Oncology Patient Advocate Encounter  Prior Authorization for Revlimid has been approved.    Effective dates: 09/10/20 through 09/10/21  RX was sent to [facility] will continue to follow.   [place] Patient [place] Phone [phone] Fax [phone] 09/11/2020 8:35 AM

## 2020-09-13 ENCOUNTER — Other Ambulatory Visit: Payer: Self-pay | Admitting: Oncology

## 2020-09-14 ENCOUNTER — Inpatient Hospital Stay: Payer: 59

## 2020-09-14 ENCOUNTER — Other Ambulatory Visit: Payer: Self-pay | Admitting: Oncology

## 2020-09-14 ENCOUNTER — Other Ambulatory Visit: Payer: Self-pay

## 2020-09-14 ENCOUNTER — Other Ambulatory Visit: Payer: 59

## 2020-09-14 VITALS — BP 109/68 | HR 71 | Temp 98.2°F | Resp 18

## 2020-09-14 DIAGNOSIS — C9000 Multiple myeloma not having achieved remission: Secondary | ICD-10-CM | POA: Diagnosis not present

## 2020-09-14 LAB — CBC WITH DIFFERENTIAL (CANCER CENTER ONLY)
Abs Immature Granulocytes: 0.1 10*3/uL — ABNORMAL HIGH (ref 0.00–0.07)
Basophils Absolute: 0 10*3/uL (ref 0.0–0.1)
Basophils Relative: 1 %
Eosinophils Absolute: 0.1 10*3/uL (ref 0.0–0.5)
Eosinophils Relative: 1 %
HCT: 33.8 % — ABNORMAL LOW (ref 36.0–46.0)
Hemoglobin: 11.5 g/dL — ABNORMAL LOW (ref 12.0–15.0)
Immature Granulocytes: 2 %
Lymphocytes Relative: 23 %
Lymphs Abs: 1.6 10*3/uL (ref 0.7–4.0)
MCH: 32.6 pg (ref 26.0–34.0)
MCHC: 34 g/dL (ref 30.0–36.0)
MCV: 95.8 fL (ref 80.0–100.0)
Monocytes Absolute: 0.7 10*3/uL (ref 0.1–1.0)
Monocytes Relative: 11 %
Neutro Abs: 4.3 10*3/uL (ref 1.7–7.7)
Neutrophils Relative %: 62 %
Platelet Count: 329 10*3/uL (ref 150–400)
RBC: 3.53 MIL/uL — ABNORMAL LOW (ref 3.87–5.11)
RDW: 12.7 % (ref 11.5–15.5)
WBC Count: 6.8 10*3/uL (ref 4.0–10.5)
nRBC: 0 % (ref 0.0–0.2)

## 2020-09-14 LAB — COMPREHENSIVE METABOLIC PANEL
ALT: 12 U/L (ref 0–44)
AST: 12 U/L — ABNORMAL LOW (ref 15–41)
Albumin: 4.2 g/dL (ref 3.5–5.0)
Alkaline Phosphatase: 80 U/L (ref 38–126)
Anion gap: 10 (ref 5–15)
BUN: 16 mg/dL (ref 6–20)
CO2: 26 mmol/L (ref 22–32)
Calcium: 11 mg/dL — ABNORMAL HIGH (ref 8.9–10.3)
Chloride: 103 mmol/L (ref 98–111)
Creatinine, Ser: 0.87 mg/dL (ref 0.44–1.00)
GFR, Estimated: >60 mL/min (ref >60)
Glucose, Bld: 88 mg/dL (ref 70–99)
Potassium: 4.2 mmol/L (ref 3.5–5.1)
Sodium: 139 mmol/L (ref 135–145)
Total Bilirubin: 0.3 mg/dL (ref 0.3–1.2)
Total Protein: 7 g/dL (ref 6.5–8.1)

## 2020-09-14 MED ORDER — OXYCODONE HCL 5 MG PO TABS: 5.0000 mg | ORAL_TABLET | ORAL | 0 refills | Status: DC | PRN

## 2020-09-14 MED ORDER — DEXAMETHASONE 4 MG PO TABS
ORAL_TABLET | ORAL | Status: AC
  Filled 2020-09-14: qty 10

## 2020-09-14 MED ORDER — BORTEZOMIB CHEMO SQ INJECTION 3.5 MG (2.5MG/ML)
1.3000 mg/m2 | Freq: Once | INTRAMUSCULAR | Status: AC
  Administered 2020-09-14: 2.25 mg via SUBCUTANEOUS
  Filled 2020-09-14: qty 0.9

## 2020-09-14 MED ORDER — DEXAMETHASONE 4 MG PO TABS
40.0000 mg | ORAL_TABLET | Freq: Once | ORAL | Status: AC
  Administered 2020-09-14: 40 mg via ORAL

## 2020-09-14 NOTE — Patient Instructions (Signed)
[facility] Discharge Instructions for Patients Receiving Chemotherapy  Today you received the following chemotherapy agents: Bortezomib (Velcade)  To help prevent nausea and vomiting after your treatment, we encourage you to take your nausea medication as prescribed.    If you develop nausea and vomiting that is not controlled by your nausea medication, call the clinic.   BELOW ARE SYMPTOMS THAT SHOULD BE REPORTED IMMEDIATELY:  *FEVER GREATER THAN 100.5 F  *CHILLS WITH OR WITHOUT FEVER  NAUSEA AND VOMITING THAT IS NOT CONTROLLED WITH YOUR NAUSEA MEDICATION  *UNUSUAL SHORTNESS OF BREATH  *UNUSUAL BRUISING OR BLEEDING  TENDERNESS IN MOUTH AND THROAT WITH OR WITHOUT PRESENCE OF ULCERS  *URINARY PROBLEMS  *BOWEL PROBLEMS  UNUSUAL RASH Items with * indicate a potential emergency and should be followed up as soon as possible.  Feel free to call the clinic should you have any questions or concerns. The clinic phone number is [phone].  Please show the [facility] [facility] at check-in to the Emergency Department and triage nurse.  Bortezomib injection What is this medicine? BORTEZOMIB (bor TEZ oh mib) targets proteins in cancer cells and stops the cancer cells from growing. It treats multiple myeloma and mantle cell lymphoma. This medicine may be used for other purposes; ask your health care provider or pharmacist if you have questions. COMMON BRAND NAME(S): Velcade What should I tell my health care provider before I take this medicine? They need to know if you have any of these conditions:  dehydration  diabetes (high blood sugar)  heart disease  liver disease  tingling of the fingers or toes or other nerve disorder  an unusual or allergic reaction to bortezomib, mannitol, boron, other medicines, foods, dyes, or preservatives  pregnant or trying to get pregnant  breast-feeding How should I use this medicine? This medicine is injected into a vein or  under the skin. It is given by a health care provider in a hospital or clinic setting. Talk to your health care provider about the use of this medicine in children. Special care may be needed. Overdosage: If you think you have taken too much of this medicine contact a poison control center or emergency room at once. NOTE: This medicine is only for you. Do not share this medicine with others. What if I miss a dose? Keep appointments for follow-up doses. It is important not to miss your dose. Call your health care provider if you are unable to keep an appointment. What may interact with this medicine? This medicine may interact with the following medications:  ketoconazole  rifampin This list may not describe all possible interactions. Give your health care provider a list of all the medicines, herbs, non-prescription drugs, or dietary supplements you use. Also tell them if you smoke, drink alcohol, or use illegal drugs. Some items may interact with your medicine. What should I watch for while using this medicine? Your condition will be monitored carefully while you are receiving this medicine. You may need blood work done while you are taking this medicine. You may get drowsy or dizzy. Do not drive, use machinery, or do anything that needs mental alertness until you know how this medicine affects you. Do not stand up or sit up quickly, especially if you are an older patient. This reduces the risk of dizzy or fainting spells This medicine may increase your risk of getting an infection. Call your health care provider for advice if you get a fever, chills, sore throat, or other symptoms  of a cold or flu. Do not treat yourself. Try to avoid being around people who are sick. Check with your health care provider if you have severe diarrhea, nausea, and vomiting, or if you sweat a lot. The loss of too much body fluid may make it dangerous for you to take this medicine. Do not become pregnant while taking  this medicine or for 7 months after stopping it. Women should inform their health care provider if they wish to become pregnant or think they might be pregnant. Men should not father a child while taking this medicine and for 4 months after stopping it. There is a potential for serious harm to an unborn child. Talk to your health care provider for more information. Do not breast-feed an infant while taking this medicine or for 2 months after stopping it. This medicine may make it more difficult to get pregnant or father a child. Talk to your health care provider if you are concerned about your fertility. What side effects may I notice from receiving this medicine? Side effects that you should report to your doctor or health care professional as soon as possible:  allergic reactions (skin rash; itching or hives; swelling of the face, lips, or tongue)  bleeding (bloody or black, tarry stools; red or dark brown urine; spitting up blood or brown material that looks like coffee grounds; red spots on the skin; unusual bruising or bleeding from the eye, gums, or nose)  blurred vision or changes in vision  confusion  constipation  headache  heart failure (trouble breathing; fast, irregular heartbeat; sudden weight gain; swelling of the ankles, feet, hands)  infection (fever, chills, cough, sore throat, pain or trouble passing urine)  lack or loss of appetite  liver injury (dark yellow or brown urine; general ill feeling or flu-like symptoms; loss of appetite, right upper belly pain; yellowing of the eyes or skin)  low blood pressure (dizziness; feeling faint or lightheaded, falls; unusually weak or tired)  muscle cramps  pain, redness, or irritation at site where injected  pain, tingling, numbness in the hands or feet  seizures  trouble breathing  unusual bruising or bleeding Side effects that usually do not require medical attention (report to your doctor or health care professional if  they continue or are bothersome):  diarrhea  nausea  stomach pain  trouble sleeping  vomiting This list may not describe all possible side effects. Call your doctor for medical advice about side effects. You may report side effects to FDA at 1-800-FDA-1088. Where should I keep my medicine? This medicine is given in a hospital or clinic. It will not be stored at home. NOTE: This sheet is a summary. It may not cover all possible information. If you have questions about this medicine, talk to your doctor, pharmacist, or health care provider.  2021 Elsevier/Gold Standard (2020-06-11 13:22:53)  Dexamethasone tablets What is this medicine? DEXAMETHASONE (dex a METH a sone) is a corticosteroid. It is commonly used to treat inflammation of the skin, joints, lungs, and other organs. Common conditions treated include asthma, allergies, and arthritis. It is also used for other conditions, such as blood disorders and diseases of the adrenal glands. This medicine may be used for other purposes; ask your health care provider or pharmacist if you have questions. COMMON BRAND NAME(S): CUSHINGS SYNDROME DIAGNOSTIC, Decadron, Dexabliss, DexPak Jr TaperPak, DexPak TaperPak, Dxevo, Hemady, HiDex, TaperDex, ZCORT, Zema-Pak, ZoDex, ZonaCort 11 Day, ZonaCort 7 Day What should I tell my health care provider before  I take this medicine? They need to know if you have any of these conditions:  Cushing's syndrome  diabetes  glaucoma  heart disease  high blood pressure  infection like herpes, measles, tuberculosis, or chickenpox  kidney disease  liver disease  mental illness  myasthenia gravis  osteoporosis  previous heart attack  seizures  stomach or intestine problems  thyroid disease  an unusual or allergic reaction to dexamethasone, corticosteroids, other medicines, lactose, foods, dyes, or preservatives  pregnant or trying to get pregnant  breast-feeding How should I use this  medicine? Take this medicine by mouth with a drink of water. Follow the directions on the prescription label. Take it with food or milk to avoid stomach upset. If you are taking this medicine once a day, take it in the morning. Do not take more medicine than you are told to take. Do not suddenly stop taking your medicine because you may develop a severe reaction. Your doctor will tell you how much medicine to take. If your doctor wants you to stop the medicine, the dose may be slowly lowered over time to avoid any side effects. Talk to your pediatrician regarding the use of this medicine in children. Special care may be needed. Patients over 63 years old may have a stronger reaction and need a smaller dose. Overdosage: If you think you have taken too much of this medicine contact a poison control center or emergency room at once. NOTE: This medicine is only for you. Do not share this medicine with others. What if I miss a dose? If you miss a dose, take it as soon as you can. If it is almost time for your next dose, talk to your doctor or health care professional. You may need to miss a dose or take an extra dose. Do not take double or extra doses without advice. What may interact with this medicine? Do not take this medicine with any of the following medications:  live virus vaccines This medicine may also interact with the following medications:  aminoglutethimide  amphotericin B  aspirin and aspirin-like medicines  certain antibiotics like erythromycin, clarithromycin, and troleandomycin  certain antivirals for HIV or hepatitis  certain medicines for seizures like carbamazepine, phenobarbital, phenytoin  certain medicines to treat myasthenia gravis  cholestyramine  cyclosporine  digoxin  diuretics  ephedrine  female hormones, like estrogen or progestins and birth control pills  insulin or other medicines for diabetes  isoniazid  ketoconazole  medicines that relax  muscles for surgery  mifepristone  NSAIDs, medicines for pain and inflammation, like ibuprofen or naproxen  rifampin  skin tests for allergies  thalidomide  vaccines  warfarin This list may not describe all possible interactions. Give your health care provider a list of all the medicines, herbs, non-prescription drugs, or dietary supplements you use. Also tell them if you smoke, drink alcohol, or use illegal drugs. Some items may interact with your medicine. What should I watch for while using this medicine? Visit your health care professional for regular checks on your progress. Tell your health care professional if your symptoms do not start to get better or if they get worse. Your condition will be monitored carefully while you are receiving this medicine. Wear a medical ID bracelet or chain. Carry a card that describes your disease and details of your medicine and dosage times. This medicine may increase your risk of getting an infection. Call your health care professional for advice if you get a fever, chills, or sore  throat, or other symptoms of a cold or flu. Do not treat yourself. Try to avoid being around people who are sick. Call your health care professional if you are around anyone with measles, chickenpox, or if you develop sores or blisters that do not heal properly. If you are going to need surgery or other procedures, tell your doctor or health care professional that you have taken this medicine within the last 12 months. Ask your doctor or health care professional about your diet. You may need to lower the amount of salt you eat. This medicine may increase blood sugar. Ask your healthcare provider if changes in diet or medicines are needed if you have diabetes. What side effects may I notice from receiving this medicine? Side effects that you should report to your doctor or health care professional as soon as possible:  allergic reactions like skin rash, itching or hives,  swelling of the face, lips, or tongue  bloody or black, tarry stools  changes in emotions or moods  changes in vision  confusion, excitement, restlessness  depressed mood  eye pain  hallucinations  fever or chills, cough, sore throat, pain or difficulty passing urine  muscle weakness  severe or sudden stomach or belly pain  signs and symptoms of high blood sugar such as being more thirsty or hungry or having to urinate more than normal. You may also feel very tired or have blurry vision.  signs and symptoms of infection like fever; chills; cough; sore throat; pain or trouble passing urine  swelling of ankles, feet  unusual bruising or bleeding  wounds that do not heal Side effects that usually do not require medical attention (report to your doctor or health care professional if they continue or are bothersome):  increased appetite  increased growth of face or body hair  headache  nausea, vomiting  skin problems, acne, thin and shiny skin  trouble sleeping  weight gain This list may not describe all possible side effects. Call your doctor for medical advice about side effects. You may report side effects to FDA at 1-800-FDA-1088. Where should I keep my medicine? Keep out of the reach of children. Store at room temperature between 20 and 25 degrees C (68 and 77 degrees F). Protect from light. Throw away any unused medicine after the expiration date. NOTE: This sheet is a summary. It may not cover all possible information. If you have questions about this medicine, talk to your doctor, pharmacist, or health care provider.  2021 Elsevier/Gold Standard (2019-01-01 14:23:34)  Lenalidomide Oral Capsules What is this medicine? LENALIDOMIDE (len a LID oh mide) is a chemotherapy drug that targets specific proteins within cancer cells and stops the cancer cell from growing. It is used to treat multiple myeloma, certain types of lymphoma, and some myelodysplastic  syndromes that cause severe anemia requiring blood transfusions. This medicine may be used for other purposes; ask your health care provider or pharmacist if you have questions. COMMON BRAND NAME(S): Revlimid What should I tell my health care provider before I take this medicine? They need to know if you have any of these conditions:  blood clots in the legs or the lungs  high blood pressure  high cholesterol  infection  irregular monthly periods or menstrual cycles  kidney disease  liver disease  smoke tobacco  thyroid disease  an unusual or allergic reaction to lenalidomide, thalidomide, other medicines, foods, dyes, or preservatives  pregnant or trying to get pregnant  breast-feeding How should I use  this medicine? Take this medicine by mouth with a glass of water. Follow the directions on the prescription label. Do not cut, crush, or chew this medicine. Take your medicine at regular intervals. Do not take it more often than directed. Do not stop taking except on your doctor's advice. A MedGuide will be given with each prescription and refill. Read this guide carefully each time. The MedGuide may change frequently. Talk to your pediatrician regarding the use of this medicine in children. Special care may be needed. Overdosage: If you think you have taken too much of this medicine contact a poison control center or emergency room at once. NOTE: This medicine is only for you. Do not share this medicine with others. What if I miss a dose? If you miss a dose, take it as soon as you can. If your next dose is to be taken in less than 12 hours, then do not take the missed dose. Take the next dose at your regular time. Do not take double or extra doses. What may interact with this medicine? This medicine may interact with the following medications:  digoxin  medicines that increase the risk of thrombosis like estrogens or erythropoietic agents (e.g., epoetin alfa and darbepoetin  alfa)  warfarin This list may not describe all possible interactions. Give your health care provider a list of all the medicines, herbs, non-prescription drugs, or dietary supplements you use. Also tell them if you smoke, drink alcohol, or use illegal drugs. Some items may interact with your medicine. What should I watch for while using this medicine? You may need blood work done while you are taking this medicine. This medicine may cause serious skin reactions. They can happen weeks to months after starting the medicine. Contact your health care provider right away if you notice fevers or flu-like symptoms with a rash. The rash may be red or purple and then turn into blisters or peeling of the skin. Or, you might notice a red rash with swelling of the face, lips or lymph nodes in your neck or under your arms. This medicine is available only through a special program. Doctors, pharmacies, and patients must meet all of the conditions of the program. Your health care provider will help you get signed up with the program if you need this medicine. Through the program you will only receive up to a 28 day supply of the medicine at one time. You will need a new prescription for each refill. This medicine can cause birth defects. Do not get pregnant while taking this drug. Females with child-bearing potential will need to have 2 negative pregnancy tests before starting this medicine. Pregnancy testing must be done every 2 to 4 weeks as directed while taking this medicine. Use 2 reliable forms of birth control together while you are taking this medicine and for 4 weeks after you stop taking this medicine. If you think that you might be pregnant talk to your doctor right away. Do not breast-feed an infant while taking this medicine. Men must use a latex condom during sexual contact with a woman while taking this medicine and for 4 weeks after you stop taking this medicine. A latex condom is needed even if you have  had a vasectomy. Contact your doctor right away if your partner becomes pregnant. Do not donate sperm while taking this medicine and for 4 weeks after you stop taking this medicine. Do not give blood while taking the medicine and for 4 weeks after completion of treatment  to avoid exposing pregnant women to the medicine through the donated blood. Talk to your doctor about your risk of cancer. You may be more at risk for certain types of cancers if you take this medicine. What side effects may I notice from receiving this medicine? Side effects that you should report to your doctor or health care professional as soon as possible:  allergic reactions like skin rash, itching or hives, swelling of the face, lips, or tongue  breathing problems  chest pain or tightness  fast, irregular heartbeat  feeling faint  low blood counts - this medicine may decrease the number of white blood cells, red blood cells and platelets. You may be at increased risk for infections and bleeding.  rash, fever, and swollen lymph nodes  redness, blistering, peeling or loosening of the skin, including inside the mouth  seizures  signs and symptoms of bleeding such as bloody or black, tarry stools; red or dark-brown urine; spitting up blood or brown material that looks like coffee grounds; red spots on the skin; unusual bruising or bleeding from the eye, gums, or nose  signs and symptoms of a blood clot such as breathing problems; changes in vision; chest pain; severe, sudden headache; pain, swelling, warmth in the leg; trouble speaking; sudden numbness or weakness of the face, arm or leg  signs and symptoms of liver injury like dark yellow or brown urine; general ill feeling or flu-like symptoms; light-colored stools; loss of appetite; nausea; right upper belly pain; unusually weak or tired; yellowing of the eyes or skin  signs and symptoms of a stroke like changes in vision; confusion; trouble speaking or  understanding; severe headaches; sudden numbness or weakness of the face, arm or leg; trouble walking; dizziness; loss of balance or coordination  sweating  vomiting Side effects that usually do not require medical attention (report to your doctor or health care professional if they continue or are bothersome):  constipation  cough  diarrhea  joint pain  muscle cramps  swelling of the arms, legs, or skin  tiredness  trouble sleeping This list may not describe all possible side effects. Call your doctor for medical advice about side effects. You may report side effects to FDA at 1-800-FDA-1088. Where should I keep my medicine? Keep out of the reach of children. Store at room temperature between 15 and 30 degrees C (59 and 86 degrees F). Throw away any unused medicine after the expiration date. NOTE: This sheet is a summary. It may not cover all possible information. If you have questions about this medicine, talk to your doctor, pharmacist, or health care provider.  2021 Elsevier/Gold Standard (2018-09-21 15:09:17)

## 2020-09-15 ENCOUNTER — Encounter (HOSPITAL_COMMUNITY): Payer: Self-pay | Admitting: Oncology

## 2020-09-15 LAB — SURGICAL PATHOLOGY

## 2020-09-16 ENCOUNTER — Telehealth: Payer: Self-pay | Admitting: *Deleted

## 2020-09-16 ENCOUNTER — Other Ambulatory Visit: Payer: Self-pay | Admitting: *Deleted

## 2020-09-16 DIAGNOSIS — C9000 Multiple myeloma not having achieved remission: Secondary | ICD-10-CM

## 2020-09-16 MED ORDER — MAGIC MOUTHWASH: 5.0000 mL | Freq: Four times a day (QID) | ORAL | 1 refills | Status: DC | PRN

## 2020-09-16 NOTE — Telephone Encounter (Signed)
Has developed tender bumps on her tongue. OK for magic mouthwash per Dr. Benay Spice. Script called in and patient notified.

## 2020-09-16 NOTE — Progress Notes (Signed)
MD requested add on of LDH and b-2 microglobulin from 09/14/20 sample. Message sent to lab to determine if this can be done. Orders are in.

## 2020-09-16 NOTE — Telephone Encounter (Signed)
-----   Message from Jolaine Click, RN sent at 09/14/2020  2:19 PM EDT ----- Regarding: First Time Velcade - Dr. Benay Spice Patient First Time Velcade - Dr. Benay Spice Patient

## 2020-09-16 NOTE — Telephone Encounter (Signed)
Called pt to see how she did with her recent treatment.  She reports having a rough night first night not sleeping & having limited mobility & woke up sweating & hot.  She saw MD at [facility] & received Evasheld.  She reports a better night last night & feeling better today although appetite is "weird"  She will discuss some meds with Dr Benay Spice at next visit.

## 2020-09-17 ENCOUNTER — Encounter: Payer: Self-pay | Admitting: *Deleted

## 2020-09-17 NOTE — Progress Notes (Signed)
09/17/20: Per lab, not able to add requested labs to her 3/14 draw. Rescheduled labs for 09/21/20 visit.

## 2020-09-17 NOTE — Progress Notes (Signed)
Faxed FISH and cytogenetics reports to [name] at [facility] [phone] per Dr. Gearldine Shown request.

## 2020-09-18 ENCOUNTER — Telehealth: Payer: Self-pay | Admitting: *Deleted

## 2020-09-18 ENCOUNTER — Other Ambulatory Visit: Payer: Self-pay | Admitting: *Deleted

## 2020-09-18 DIAGNOSIS — C9000 Multiple myeloma not having achieved remission: Secondary | ICD-10-CM

## 2020-09-18 NOTE — Telephone Encounter (Signed)
Called to report she has developed itching of her scalp and behind the ears. Scalp is tender as well. Skin behind her ears feels hot and itches. No one home to see if there is a rash. Also had a headache, that has now resolved. She did receive the Evusheld injection at [facility] on 09/15/20. Informed that the itching is most likely due to the Revlimid, but will verify w/MD.

## 2020-09-18 NOTE — Telephone Encounter (Signed)
Called back and confirmed could be from Revlimid. MD suggests trying OTC Benadryl po or topical. Will see on 3/21.

## 2020-09-20 ENCOUNTER — Other Ambulatory Visit: Payer: Self-pay | Admitting: Oncology

## 2020-09-21 ENCOUNTER — Inpatient Hospital Stay: Payer: 59

## 2020-09-21 ENCOUNTER — Inpatient Hospital Stay (HOSPITAL_BASED_OUTPATIENT_CLINIC_OR_DEPARTMENT_OTHER): Payer: 59 | Admitting: Oncology

## 2020-09-21 ENCOUNTER — Other Ambulatory Visit: Payer: 59

## 2020-09-21 ENCOUNTER — Ambulatory Visit: Payer: 59

## 2020-09-21 ENCOUNTER — Encounter: Payer: Self-pay | Admitting: General Practice

## 2020-09-21 ENCOUNTER — Other Ambulatory Visit: Payer: Self-pay

## 2020-09-21 VITALS — BP 111/64 | HR 62 | Temp 97.8°F | Resp 20 | Ht 65.0 in | Wt 161.4 lb

## 2020-09-21 DIAGNOSIS — C9000 Multiple myeloma not having achieved remission: Secondary | ICD-10-CM

## 2020-09-21 LAB — CBC WITH DIFFERENTIAL (CANCER CENTER ONLY)
Abs Immature Granulocytes: 0.02 10*3/uL (ref 0.00–0.07)
Basophils Absolute: 0 10*3/uL (ref 0.0–0.1)
Basophils Relative: 0 %
Eosinophils Absolute: 0.1 10*3/uL (ref 0.0–0.5)
Eosinophils Relative: 3 %
HCT: 31.6 % — ABNORMAL LOW (ref 36.0–46.0)
Hemoglobin: 10.7 g/dL — ABNORMAL LOW (ref 12.0–15.0)
Immature Granulocytes: 1 %
Lymphocytes Relative: 27 %
Lymphs Abs: 0.7 10*3/uL (ref 0.7–4.0)
MCH: 32.6 pg (ref 26.0–34.0)
MCHC: 33.9 g/dL (ref 30.0–36.0)
MCV: 96.3 fL (ref 80.0–100.0)
Monocytes Absolute: 0.2 10*3/uL (ref 0.1–1.0)
Monocytes Relative: 7 %
Neutro Abs: 1.6 10*3/uL — ABNORMAL LOW (ref 1.7–7.7)
Neutrophils Relative %: 62 %
Platelet Count: 235 10*3/uL (ref 150–400)
RBC: 3.28 MIL/uL — ABNORMAL LOW (ref 3.87–5.11)
RDW: 12.9 % (ref 11.5–15.5)
WBC Count: 2.6 10*3/uL — ABNORMAL LOW (ref 4.0–10.5)
nRBC: 0 % (ref 0.0–0.2)

## 2020-09-21 LAB — CMP (CANCER CENTER ONLY)
ALT: 21 U/L (ref 0–44)
AST: 11 U/L — ABNORMAL LOW (ref 15–41)
Albumin: 4.1 g/dL (ref 3.5–5.0)
Alkaline Phosphatase: 80 U/L (ref 38–126)
Anion gap: 12 (ref 5–15)
BUN: 13 mg/dL (ref 6–20)
CO2: 24 mmol/L (ref 22–32)
Calcium: 9.5 mg/dL (ref 8.9–10.3)
Chloride: 103 mmol/L (ref 98–111)
Creatinine: 0.8 mg/dL (ref 0.44–1.00)
GFR, Estimated: >60 mL/min (ref >60)
Glucose, Bld: 106 mg/dL — ABNORMAL HIGH (ref 70–99)
Potassium: 3.8 mmol/L (ref 3.5–5.1)
Sodium: 139 mmol/L (ref 135–145)
Total Bilirubin: 0.4 mg/dL (ref 0.3–1.2)
Total Protein: 6.5 g/dL (ref 6.5–8.1)

## 2020-09-21 LAB — LACTATE DEHYDROGENASE: LDH: 160 U/L (ref 98–192)

## 2020-09-21 MED ORDER — ZOLEDRONIC ACID 4 MG/100ML IV SOLN
INTRAVENOUS | Status: AC
  Filled 2020-09-21: qty 100

## 2020-09-21 MED ORDER — DEXAMETHASONE 4 MG PO TABS
ORAL_TABLET | ORAL | Status: AC
  Filled 2020-09-21: qty 10

## 2020-09-21 MED ORDER — SODIUM CHLORIDE 0.9 % IV SOLN
Freq: Once | INTRAVENOUS | Status: AC
Start: 2020-09-21 — End: 2020-09-21
  Filled 2020-09-21: qty 250

## 2020-09-21 MED ORDER — BORTEZOMIB CHEMO SQ INJECTION 3.5 MG (2.5MG/ML)
1.3000 mg/m2 | Freq: Once | INTRAMUSCULAR | Status: AC
  Administered 2020-09-21: 2.25 mg via SUBCUTANEOUS
  Filled 2020-09-21: qty 0.9

## 2020-09-21 MED ORDER — DEXAMETHASONE 4 MG PO TABS
40.0000 mg | ORAL_TABLET | Freq: Once | ORAL | Status: AC
  Administered 2020-09-21: 40 mg via ORAL

## 2020-09-21 MED ORDER — METHOCARBAMOL 500 MG PO TABS: 500.0000 mg | ORAL_TABLET | Freq: Two times a day (BID) | ORAL | 0 refills | Status: DC | PRN

## 2020-09-21 MED ORDER — ZOLEDRONIC ACID 4 MG/100ML IV SOLN
4.0000 mg | Freq: Once | INTRAVENOUS | Status: AC
Start: 2020-09-21 — End: 2020-09-21
  Administered 2020-09-21: 4 mg via INTRAVENOUS

## 2020-09-21 NOTE — Progress Notes (Signed)
Per Dr. Benay Spice: OK to treat w/ANC 1.6. Has already been to dentist, so will receive Zometa today.

## 2020-09-21 NOTE — Patient Instructions (Addendum)
[facility] Discharge Instructions for Patients Receiving Chemotherapy  Today you received the following chemotherapy agents bortezomib  To help prevent nausea and vomiting after your treatment, we encourage you to take your nausea medication as directed.  If you develop nausea and vomiting that is not controlled by your nausea medication, call the clinic.   BELOW ARE SYMPTOMS THAT SHOULD BE REPORTED IMMEDIATELY:  *FEVER GREATER THAN 100.5 F  *CHILLS WITH OR WITHOUT FEVER  NAUSEA AND VOMITING THAT IS NOT CONTROLLED WITH YOUR NAUSEA MEDICATION  *UNUSUAL SHORTNESS OF BREATH  *UNUSUAL BRUISING OR BLEEDING  TENDERNESS IN MOUTH AND THROAT WITH OR WITHOUT PRESENCE OF ULCERS  *URINARY PROBLEMS  *BOWEL PROBLEMS  UNUSUAL RASH Items with * indicate a potential emergency and should be followed up as soon as possible.  Feel free to call the clinic should you have any questions or concerns. The clinic phone number is [phone].  Please show the CHEMO ALERT CARD at check-in to the Emergency Department and triage nurse.  Zoledronic Acid Injection (Hypercalcemia, Oncology) What is this medicine? ZOLEDRONIC ACID (ZOE le dron ik AS id) slows calcium loss from bones. It high calcium levels in the blood from some kinds of cancer. It may be used in other people at risk for bone loss. This medicine may be used for other purposes; ask your health care provider or pharmacist if you have questions. COMMON BRAND NAME(S): Zometa What should I tell my health care provider before I take this medicine? They need to know if you have any of these conditions:  cancer  dehydration  dental disease  kidney disease  liver disease  low levels of calcium in the blood  lung or breathing disease (asthma)  receiving steroids like dexamethasone or prednisone  an unusual or allergic reaction to zoledronic acid, other medicines, foods, dyes, or preservatives  pregnant or trying to  get pregnant  breast-feeding How should I use this medicine? This drug is injected into a vein. It is given by a health care provider in a hospital or clinic setting. Talk to your health care provider about the use of this drug in children. Special care may be needed. Overdosage: If you think you have taken too much of this medicine contact a poison control center or emergency room at once. NOTE: This medicine is only for you. Do not share this medicine with others. What if I miss a dose? Keep appointments for follow-up doses. It is important not to miss your dose. Call your health care provider if you are unable to keep an appointment. What may interact with this medicine?  certain antibiotics given by injection  NSAIDs, medicines for pain and inflammation, like ibuprofen or naproxen  some diuretics like bumetanide, furosemide  teriparatide  thalidomide This list may not describe all possible interactions. Give your health care provider a list of all the medicines, herbs, non-prescription drugs, or dietary supplements you use. Also tell them if you smoke, drink alcohol, or use illegal drugs. Some items may interact with your medicine. What should I watch for while using this medicine? Visit your health care provider for regular checks on your progress. It may be some time before you see the benefit from this drug. Some people who take this drug have severe bone, joint, or muscle pain. This drug may also increase your risk for jaw problems or a broken thigh bone. Tell your health care provider right away if you have severe pain in your jaw, bones, joints, or   or muscles. Tell you health care provider if you have any pain that does not go away or that gets worse. Tell your dentist and dental surgeon that you are taking this drug. You should not have major dental surgery while on this drug. See your dentist to have a dental exam and fix any dental problems before starting this drug. Take good  care of your teeth while on this drug. Make sure you see your dentist for regular follow-up appointments. You should make sure you get enough calcium and vitamin D while you are taking this drug. Discuss the foods you eat and the vitamins you take with your health care provider. Check with your health care provider if you have severe diarrhea, nausea, and vomiting, or if you sweat a lot. The loss of too much body fluid may make it dangerous for you to take this drug. You may need blood work done while you are taking this drug. Do not become pregnant while taking this drug. Women should inform their health care provider if they wish to become pregnant or think they might be pregnant. There is potential for serious harm to an unborn child. Talk to your health care provider for more information. What side effects may I notice from receiving this medicine? Side effects that you should report to your doctor or health care provider as soon as possible:  allergic reactions (skin rash, itching or hives; swelling of the face, lips, or tongue)  bone pain  infection (fever, chills, cough, sore throat, pain or trouble passing urine)  jaw pain, especially after dental work  joint pain  kidney injury (trouble passing urine or change in the amount of urine)  low blood pressure (dizziness; feeling faint or lightheaded, falls; unusually weak or tired)  low calcium levels (fast heartbeat; muscle cramps or pain; pain, tingling, or numbness in the hands or feet; seizures)  low magnesium levels (fast, irregular heartbeat; muscle cramp or pain; muscle weakness; tremors; seizures)  low red blood cell counts (trouble breathing; feeling faint; lightheaded, falls; unusually weak or tired)  muscle pain  redness, blistering, peeling, or loosening of the skin, including inside the mouth  severe diarrhea  swelling of the ankles, feet, hands  trouble breathing Side effects that usually do not require medical  attention (report to your doctor or health care provider if they continue or are bothersome):  anxious  constipation  coughing  depressed mood  eye irritation, itching, or pain  fever  general ill feeling or flu-like symptoms  nausea  pain, redness, or irritation at site where injected  trouble sleeping This list may not describe all possible side effects. Call your doctor for medical advice about side effects. You may report side effects to FDA at 1-800-FDA-1088. Where should I keep my medicine? This drug is given in a hospital or clinic. It will not be stored at home. NOTE: This sheet is a summary. It may not cover all possible information. If you have questions about this medicine, talk to your doctor, pharmacist, or health care provider.  2021 Elsevier/Gold Standard (2019-04-04 09:13:00)

## 2020-09-21 NOTE — Progress Notes (Signed)
[place] OFFICE PROGRESS NOTE   Diagnosis: Multiple myeloma  INTERVAL HISTORY:   Katherine Beard returns as scheduled.  She began cycle 1 RVD on 09/14/2020.  She reports tolerating the treatment well.  She had insomnia on the evening of day 1.  Back pain has partially improved.  She is more mobile. She saw [name] on 09/15/2020.  He agreed with the RVD regimen and recommended adding daratumumab.  She received Evusheld on 09/15/2020.  She saw her dentist.    Objective:  Vital signs in last 24 hours:  Blood pressure 111/64, pulse 62, temperature 97.8 F (36.6 C), temperature source Tympanic, resp. rate 20, height 5' 5"  (1.651 m), weight 161 lb 6.4 oz (73.2 kg), last menstrual period 03/06/2011, SpO2 100 %.    HEENT: No thrush, 1 mm ecchymosis of the left buccal mucosa Resp: Lungs clear bilaterally Cardio: Regular rate and rhythm GI: Nontender, no hepatosplenomegaly Vascular: No leg edema     Lab Results:  Lab Results  Component Value Date   WBC 2.6 (L) 09/21/2020   HGB 10.7 (L) 09/21/2020   HCT 31.6 (L) 09/21/2020   MCV 96.3 09/21/2020   PLT 235 09/21/2020   NEUTROABS 1.6 (L) 09/21/2020    CMP  Lab Results  Component Value Date   NA 139 09/14/2020   K 4.2 09/14/2020   CL 103 09/14/2020   CO2 26 09/14/2020   GLUCOSE 88 09/14/2020   BUN 16 09/14/2020   CREATININE 0.87 09/14/2020   CALCIUM 11.0 (H) 09/14/2020   PROT 7.0 09/14/2020   ALBUMIN 4.2 09/14/2020   AST 12 (L) 09/14/2020   ALT 12 09/14/2020   ALKPHOS 80 09/14/2020   BILITOT 0.3 09/14/2020   GFRNONAA >60 09/14/2020   GFRAA 78 12/11/2006     Medications: I have reviewed the patient's current medications.   Assessment/Plan:  1.  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 Severe back pain  MRI lumbar spine July 12, 2020-subacute compression fractures at T12 and L1  MRI lumbar spine July 30, 2020-subacute T12 and L1 compression fractures, new subacute superior L2 endplate deformity  MRI lumbar spine September 01, 2020 new/progressive compression fractures at L2 and L3, acute compression fracture at L4, evolution of T12 and L1 compression fractures  MRI pelvis September 01, 2020-innumerable small foci of signal abnormality in the pelvis and femurs, no fracture identified, concerning for multiple myeloma versus metastases  Zometa beginning 09/21/2020 3. Melanoma left upper back 2014 4. History of diverticulitis 5. Mild hypercalcemia at presentation-resolved 6. Osteoporosis 7. Evusheld  Disposition: Katherine Beard has been diagnosed with multiple myeloma.  She is completing cycle 1 RVD.  She will complete day 8 Velcade today.  She has mild neutropenia.  The plan is to add daratumumab.  We reviewed potential toxicities associated with daratumumab including the chance of allergic reaction and respiratory symptoms.  She agrees to proceed.  Daratumumab will be added with day 15 therapy.  Katherine Beard will receive Zometa today.  We reviewed potential toxicities associated with Zometa including the chance of osteonecrosis and flulike  symptoms.  She will return for an office visit and day 15 Velcade/daratumumab on 09/28/2020.  We refilled her prescription for Robaxin.  I encouraged her to increase her activity level as tolerated.    [name], MD  09/21/2020  8:57 AM

## 2020-09-21 NOTE — Progress Notes (Signed)
[place] Spiritual Care Note  Followed up with Katherine Beard in infusion, which proved to be a very helpful environment for her to use for sharing her story and processing feelings associated with diagnosis and treatment. She utilized Spiritual Care well and welcomes follow-ups in infusion. She also has my direct-dial number to reach out in a time and place comfortable for her. Encouraged her to try Blood Cancer Support Group for fellowship with and support from other people living with multiple myeloma, as well.   [place], [place], [facility] Pager [phone] Voicemail [phone]

## 2020-09-22 LAB — BETA 2 MICROGLOBULIN, SERUM: Beta-2 Microglobulin: 2 mg/L (ref 0.6–2.4)

## 2020-09-23 ENCOUNTER — Telehealth: Payer: Self-pay | Admitting: Oncology

## 2020-09-23 ENCOUNTER — Telehealth: Payer: Self-pay | Admitting: *Deleted

## 2020-09-23 NOTE — Telephone Encounter (Signed)
Called patient to f/u on her call last night to [employer] line: Continues today to have intermittent sharp pain in her hips and lower back. Informed her this is most likely the Zometa she received yesterday. Suggested heating pad and add Ibuprofen to her pain med regimen. It should resolve soon.

## 2020-09-23 NOTE — Telephone Encounter (Signed)
Scheduled appt per 3/21 LOS - unable to reach pt . Left message for patient that 3/28 infusion was lengthen due to added treatment.

## 2020-09-25 ENCOUNTER — Telehealth: Payer: Self-pay | Admitting: *Deleted

## 2020-09-25 ENCOUNTER — Other Ambulatory Visit: Payer: Self-pay | Admitting: Oncology

## 2020-09-25 DIAGNOSIS — C9000 Multiple myeloma not having achieved remission: Secondary | ICD-10-CM

## 2020-09-25 NOTE — Telephone Encounter (Signed)
Katherine Beard states she has been having upper back pain and stiffness all week after taking Zometa. Was concerned about started new treatment on Monday. She did not realize that she will be seeing Ned Card, NP prior to treatment. Will rest this weekend and talk to [place] on Monday.

## 2020-09-28 ENCOUNTER — Inpatient Hospital Stay (HOSPITAL_BASED_OUTPATIENT_CLINIC_OR_DEPARTMENT_OTHER): Payer: 59 | Admitting: Nurse Practitioner

## 2020-09-28 ENCOUNTER — Other Ambulatory Visit: Payer: 59

## 2020-09-28 ENCOUNTER — Encounter: Payer: Self-pay | Admitting: Nurse Practitioner

## 2020-09-28 ENCOUNTER — Encounter: Payer: Self-pay | Admitting: General Practice

## 2020-09-28 ENCOUNTER — Ambulatory Visit: Payer: 59

## 2020-09-28 ENCOUNTER — Ambulatory Visit: Payer: 59 | Admitting: Nurse Practitioner

## 2020-09-28 ENCOUNTER — Ambulatory Visit: Payer: Self-pay

## 2020-09-28 ENCOUNTER — Other Ambulatory Visit: Payer: Self-pay

## 2020-09-28 ENCOUNTER — Inpatient Hospital Stay: Payer: 59

## 2020-09-28 VITALS — BP 103/51 | HR 57 | Temp 97.8°F | Resp 14 | Ht 65.0 in | Wt 162.4 lb

## 2020-09-28 VITALS — BP 107/73 | HR 70 | Temp 97.8°F | Resp 16

## 2020-09-28 DIAGNOSIS — C9000 Multiple myeloma not having achieved remission: Secondary | ICD-10-CM

## 2020-09-28 LAB — CBC WITH DIFFERENTIAL/PLATELET
Abs Immature Granulocytes: 0.01 10*3/uL (ref 0.00–0.07)
Basophils Absolute: 0 10*3/uL (ref 0.0–0.1)
Basophils Relative: 1 %
Eosinophils Absolute: 0.1 10*3/uL (ref 0.0–0.5)
Eosinophils Relative: 2 %
HCT: 34.5 % — ABNORMAL LOW (ref 36.0–46.0)
Hemoglobin: 11.5 g/dL — ABNORMAL LOW (ref 12.0–15.0)
Immature Granulocytes: 0 %
Lymphocytes Relative: 20 %
Lymphs Abs: 0.8 10*3/uL (ref 0.7–4.0)
MCH: 32.9 pg (ref 26.0–34.0)
MCHC: 33.3 g/dL (ref 30.0–36.0)
MCV: 98.6 fL (ref 80.0–100.0)
Monocytes Absolute: 0.6 10*3/uL (ref 0.1–1.0)
Monocytes Relative: 15 %
Neutro Abs: 2.4 10*3/uL (ref 1.7–7.7)
Neutrophils Relative %: 62 %
Platelets: 222 10*3/uL (ref 150–400)
RBC: 3.5 MIL/uL — ABNORMAL LOW (ref 3.87–5.11)
RDW: 14.4 % (ref 11.5–15.5)
WBC: 3.8 10*3/uL — ABNORMAL LOW (ref 4.0–10.5)
nRBC: 0 % (ref 0.0–0.2)

## 2020-09-28 LAB — CMP (CANCER CENTER ONLY)
ALT: 19 U/L (ref 0–44)
AST: 10 U/L — ABNORMAL LOW (ref 15–41)
Albumin: 3.8 g/dL (ref 3.5–5.0)
Alkaline Phosphatase: 113 U/L (ref 38–126)
Anion gap: 10 (ref 5–15)
BUN: 13 mg/dL (ref 6–20)
CO2: 24 mmol/L (ref 22–32)
Calcium: 8.2 mg/dL — ABNORMAL LOW (ref 8.9–10.3)
Chloride: 105 mmol/L (ref 98–111)
Creatinine: 0.76 mg/dL (ref 0.44–1.00)
GFR, Estimated: >60 mL/min (ref >60)
Glucose, Bld: 90 mg/dL (ref 70–99)
Potassium: 3.9 mmol/L (ref 3.5–5.1)
Sodium: 139 mmol/L (ref 135–145)
Total Bilirubin: 0.3 mg/dL (ref 0.3–1.2)
Total Protein: 6 g/dL — ABNORMAL LOW (ref 6.5–8.1)

## 2020-09-28 LAB — PRETREATMENT RBC PHENOTYPE

## 2020-09-28 LAB — TYPE AND SCREEN
ABO/RH(D): A POS
Antibody Screen: NEGATIVE

## 2020-09-28 MED ORDER — MONTELUKAST SODIUM 10 MG PO TABS
ORAL_TABLET | ORAL | Status: AC
  Filled 2020-09-28: qty 1

## 2020-09-28 MED ORDER — DIPHENHYDRAMINE HCL 25 MG PO CAPS
ORAL_CAPSULE | ORAL | Status: AC
  Filled 2020-09-28: qty 2

## 2020-09-28 MED ORDER — ACETAMINOPHEN 325 MG PO TABS
650.0000 mg | ORAL_TABLET | Freq: Once | ORAL | Status: AC
  Administered 2020-09-28: 650 mg via ORAL

## 2020-09-28 MED ORDER — MONTELUKAST SODIUM 10 MG PO TABS
10.0000 mg | ORAL_TABLET | Freq: Once | ORAL | Status: AC
  Administered 2020-09-28: 10 mg via ORAL

## 2020-09-28 MED ORDER — BORTEZOMIB CHEMO SQ INJECTION 3.5 MG (2.5MG/ML)
1.2000 mg/m2 | Freq: Once | INTRAMUSCULAR | Status: AC
  Administered 2020-09-28: 2.25 mg via SUBCUTANEOUS
  Filled 2020-09-28: qty 0.9

## 2020-09-28 MED ORDER — DEXAMETHASONE 4 MG PO TABS
20.0000 mg | ORAL_TABLET | Freq: Once | ORAL | Status: AC
  Administered 2020-09-28: 20 mg via ORAL

## 2020-09-28 MED ORDER — DARATUMUMAB-HYALURONIDASE-FIHJ 1800-30000 MG-UT/15ML ~~LOC~~ SOLN
1800.0000 mg | Freq: Once | SUBCUTANEOUS | Status: AC
  Administered 2020-09-28: 1800 mg via SUBCUTANEOUS
  Filled 2020-09-28: qty 15

## 2020-09-28 MED ORDER — ACETAMINOPHEN 325 MG PO TABS
ORAL_TABLET | ORAL | Status: AC
  Filled 2020-09-28: qty 2

## 2020-09-28 MED ORDER — DIPHENHYDRAMINE HCL 25 MG PO CAPS
50.0000 mg | ORAL_CAPSULE | Freq: Once | ORAL | Status: AC
  Administered 2020-09-28: 50 mg via ORAL

## 2020-09-28 MED ORDER — DEXAMETHASONE 4 MG PO TABS
ORAL_TABLET | ORAL | Status: AC
  Filled 2020-09-28: qty 5

## 2020-09-28 NOTE — Progress Notes (Signed)
[place] OFFICE PROGRESS NOTE   Diagnosis: Multiple myeloma  INTERVAL HISTORY:   Katherine Beard returns as scheduled.  She began cycle 1 RVD 09/14/2020.  Daratumumab to be added today.  She denies nausea/vomiting.  No diarrhea.  Last week she had some "fever blisters".  These have resolved.  She notes bilateral ankle edema.  Pain overall seems to be a little better.  She noted some numbness in the fingertips for about 2 hours after Zometa last week.  Some achiness and stiffness as well.  Objective:  Vital signs in last 24 hours:  Blood pressure (!) 103/51, pulse (!) 57, temperature 97.8 F (36.6 C), temperature source Tympanic, resp. rate 14, height 5' 5"  (1.651 m), weight 162 lb 6.4 oz (73.7 kg), last menstrual period 03/06/2011, SpO2 100 %.    HEENT: No thrush or ulcers.  No perioral lesions. Resp: Lungs clear bilaterally. Cardio: Regular rate and rhythm. GI: Abdomen soft.  Mild tenderness at the left lower abdomen.  No hepatomegaly. Vascular: Minimal edema at the ankles.  Lab Results:  Lab Results  Component Value Date   WBC 3.8 (L) 09/28/2020   HGB 11.5 (L) 09/28/2020   HCT 34.5 (L) 09/28/2020   MCV 98.6 09/28/2020   PLT 222 09/28/2020   NEUTROABS 2.4 09/28/2020    Imaging:  No results found.  Medications: I have reviewed the patient's current medications.  Assessment/Plan:  1.  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 Severe back pain ? MRI lumbar spine July 12, 2020-subacute compression fractures at T12 and L1 ? MRI lumbar spine July 30, 2020-subacute T12 and L1 compression fractures, new subacute superior L2 endplate deformity ? MRI lumbar spine September 01, 2020 new/progressive compression fractures at L2 and L3, acute compression fracture at L4, evolution of T12 and L1 compression fractures ? MRI pelvis September 01, 2020-innumerable small foci of signal abnormality in the pelvis and femurs, no fracture identified, concerning for multiple myeloma versus metastases ? Zometa beginning 09/21/2020-plan monthly x3 then every 3 months, next due 10/19/2020 3. Melanoma left upper back 2014 4. History of diverticulitis 5. Mild hypercalcemia at presentation-resolved 6. Osteoporosis 7. Evusheld   Disposition: Katherine Beard appears stable.  She began the 7-day break for Revlimid today.  Plan for Velcade as scheduled and begin weekly daratumumab today.  Potential toxicities associated with daratumumab again reviewed.  She agrees to proceed.  We reviewed the CBC from today.  Counts adequate to proceed with treatment.  She will return for Velcade and daratumumab in 1 week.  Next cycle of Revlimid to begin 10/05/2020.  We will see her in follow-up in 2 weeks.  She will contact the office in the interim with any problems.  Patient seen with Dr. Benay Spice.  Ned Card ANP/GNP-BC   09/28/2020  12:28 PM This was a shared visit with Ned Card.  Katherine Beard has an improved performance status since beginning systemic therapy.  She will complete another treatment with Velcade today.  She will begin weekly daratumumab today.  The plan is to continue VRD/daratumumab.  She will receive Zometa monthly for 2-3 months and then convert to a 47-monthschedule.  I was present for greater than 50% of today's visit.  I perform medical decision making.  BJulieanne Manson MD

## 2020-09-28 NOTE — Progress Notes (Signed)
[place] Spiritual Care Note  Made a follow-up visit with Katherine Beard in infusion. She reports feeling better this week after an emotionally tough week last week (due to side effects, weather, etc). Her sister is also visiting for a few days, which has been very helpful. Katherine Ken has registered for Blood Cancer Support Group and is looking forward to community and mutual encouragement. She also put my number in her phone, and we plan to keep in touch even after her treatment moves to [place].   [place], [place], [facility] Pager [phone] Voicemail [phone]

## 2020-09-28 NOTE — Patient Instructions (Addendum)
[place] Discharge Instructions for Patients Receiving Chemotherapy  Today you received the following chemotherapy agents Velcade; Daratumumab  To help prevent nausea and vomiting after your treatment, we encourage you to take your nausea medication as directed If you develop nausea and vomiting that is not controlled by your nausea medication, call the clinic.   BELOW ARE SYMPTOMS THAT SHOULD BE REPORTED IMMEDIATELY:  *FEVER GREATER THAN 100.5 F  *CHILLS WITH OR WITHOUT FEVER  NAUSEA AND VOMITING THAT IS NOT CONTROLLED WITH YOUR NAUSEA MEDICATION  *UNUSUAL SHORTNESS OF BREATH  *UNUSUAL BRUISING OR BLEEDING  TENDERNESS IN MOUTH AND THROAT WITH OR WITHOUT PRESENCE OF ULCERS  *URINARY PROBLEMS  *BOWEL PROBLEMS  UNUSUAL RASH Items with * indicate a potential emergency and should be followed up as soon as possible.  Feel free to call the clinic should you have any questions or concerns. The clinic phone number is [phone].  Please show the [place] at check-in to the Emergency Department and triage nurse.  Daratumumab injection What is this medicine? DARATUMUMAB (dar a toom ue mab) is a monoclonal antibody. It is used to treat multiple myeloma. This medicine may be used for other purposes; ask your health care provider or pharmacist if you have questions. COMMON BRAND NAME(S): DARZALEX What should I tell my health care provider before I take this medicine? They need to know if you have any of these conditions:  hereditary fructose intolerance  infection (especially a virus infection such as chickenpox, herpes, or hepatitis B virus)  lung or breathing disease (asthma, COPD)  an unusual or allergic reaction to daratumumab, sorbitol, other medicines, foods, dyes, or preservatives  pregnant or trying to get pregnant  breast-feeding How should I use this medicine? This medicine is for infusion into a vein. It is given by a health care  professional in a hospital or clinic setting. Talk to your pediatrician regarding the use of this medicine in children. Special care may be needed. Overdosage: If you think you have taken too much of this medicine contact a poison control center or emergency room at once. NOTE: This medicine is only for you. Do not share this medicine with others. What if I miss a dose? Keep appointments for follow-up doses as directed. It is important not to miss your dose. Call your doctor or health care professional if you are unable to keep an appointment. What may interact with this medicine? Interactions have not been studied. This list may not describe all possible interactions. Give your health care provider a list of all the medicines, herbs, non-prescription drugs, or dietary supplements you use. Also tell them if you smoke, drink alcohol, or use illegal drugs. Some items may interact with your medicine. What should I watch for while using this medicine? Your condition will be monitored carefully while you are receiving this medicine. This medicine can cause serious allergic reactions. To reduce your risk, your health care provider may give you other medicine to take before receiving this one. Be sure to follow the directions from your health care provider. This medicine can affect the results of blood tests to match your blood type. These changes can last for up to 6 months after the final dose. Your healthcare provider will do blood tests to match your blood type before you start treatment. Tell all of your healthcare providers that you are being treated with this medicine before receiving a blood transfusion. This medicine can affect the results of some tests used  to determine treatment response; extra tests may be needed to evaluate response. Do not become pregnant while taking this medicine or for 3 months after stopping it. Women should inform their health care provider if they wish to become pregnant  or think they might be pregnant. There is a potential for serious side effects to an unborn child. Talk to your health care provider for more information. Do not breast-feed an infant while taking this medicine. What side effects may I notice from receiving this medicine? Side effects that you should report to your doctor or health care professional as soon as possible:  allergic reactions (skin rash; itching or hives; swelling of the face, lips, or tongue)  infection (fever, chills, cough, sore throat, pain or difficulty passing urine)  infusion reaction (dizziness, fast heartbeat, feeling faint or lightheaded, falls, headache, increase in blood pressure, nausea, vomiting, or wheezing or trouble breathing with loud or whistling sounds)  unusual bleeding or bruising Side effects that usually do not require medical attention (report to your doctor or health care professional if they continue or are bothersome):  constipation  diarrhea  pain, tingling, numbness in the hands or feet  swelling of the ankles, feet, hands  tiredness This list may not describe all possible side effects. Call your doctor for medical advice about side effects. You may report side effects to FDA at 1-800-FDA-1088. Where should I keep my medicine? This drug is given in a hospital or clinic and will not be stored at home. NOTE: This sheet is a summary. It may not cover all possible information. If you have questions about this medicine, talk to your doctor, pharmacist, or health care provider.  2021 Elsevier/Gold Standard (2020-06-11 13:28:52)

## 2020-09-29 ENCOUNTER — Telehealth: Payer: Self-pay

## 2020-09-29 ENCOUNTER — Other Ambulatory Visit: Payer: Self-pay

## 2020-09-29 ENCOUNTER — Other Ambulatory Visit: Payer: Self-pay | Admitting: Oncology

## 2020-09-29 DIAGNOSIS — C9000 Multiple myeloma not having achieved remission: Secondary | ICD-10-CM

## 2020-09-29 MED ORDER — LENALIDOMIDE 25 MG PO CAPS: 25.0000 mg | ORAL_CAPSULE | Freq: Every day | ORAL | 0 refills | Status: DC

## 2020-09-29 NOTE — Telephone Encounter (Signed)
[place] [phone]

## 2020-09-29 NOTE — Telephone Encounter (Signed)
Spoke to patient regarding her flushing and jumpy vision per Dr. Benay Spice is most likely from the Decadron.  I have instructed her to call back if her symptoms worsen.  She verbalized an understanding.

## 2020-09-30 ENCOUNTER — Telehealth: Payer: Self-pay | Admitting: *Deleted

## 2020-09-30 NOTE — Telephone Encounter (Signed)
Patient called to report that she is still having the jumpy vision but this morning she woke up with swollen eyes so bad that she had to use ice chips over the eyes.  Discussed with Valda Favia and she encourgaed that she try some zrytex or Claritin to see if her eyes improve as this symptom does not sound like it is related to her Darzalex.Marland Kitchen  She was agreeable to this because after she thought about it she was outside more yesterday so it could be related to allergies.  She will report back if this does not help.

## 2020-10-01 ENCOUNTER — Other Ambulatory Visit: Payer: Self-pay | Admitting: *Deleted

## 2020-10-01 DIAGNOSIS — C9000 Multiple myeloma not having achieved remission: Secondary | ICD-10-CM

## 2020-10-01 NOTE — Progress Notes (Signed)
Error

## 2020-10-02 ENCOUNTER — Other Ambulatory Visit: Payer: Self-pay | Admitting: *Deleted

## 2020-10-02 ENCOUNTER — Telehealth: Payer: Self-pay | Admitting: Oncology

## 2020-10-02 DIAGNOSIS — C9000 Multiple myeloma not having achieved remission: Secondary | ICD-10-CM

## 2020-10-02 NOTE — Telephone Encounter (Signed)
Reminder call for patient for 4/4 appt  - left message for patient with appt date and time  And new address

## 2020-10-03 ENCOUNTER — Other Ambulatory Visit: Payer: Self-pay | Admitting: Oncology

## 2020-10-05 ENCOUNTER — Other Ambulatory Visit: Payer: Self-pay

## 2020-10-05 ENCOUNTER — Inpatient Hospital Stay: Payer: 59

## 2020-10-05 ENCOUNTER — Telehealth: Payer: Self-pay

## 2020-10-05 ENCOUNTER — Ambulatory Visit: Payer: 59

## 2020-10-05 ENCOUNTER — Inpatient Hospital Stay: Payer: 59 | Attending: Oncology

## 2020-10-05 ENCOUNTER — Other Ambulatory Visit: Payer: Self-pay | Admitting: Nurse Practitioner

## 2020-10-05 ENCOUNTER — Other Ambulatory Visit: Payer: 59

## 2020-10-05 VITALS — BP 107/66 | HR 67 | Temp 98.6°F | Resp 20

## 2020-10-05 DIAGNOSIS — G4709 Other insomnia: Secondary | ICD-10-CM | POA: Insufficient documentation

## 2020-10-05 DIAGNOSIS — C9000 Multiple myeloma not having achieved remission: Secondary | ICD-10-CM

## 2020-10-05 DIAGNOSIS — M549 Dorsalgia, unspecified: Secondary | ICD-10-CM | POA: Insufficient documentation

## 2020-10-05 DIAGNOSIS — M25551 Pain in right hip: Secondary | ICD-10-CM | POA: Insufficient documentation

## 2020-10-05 DIAGNOSIS — R4586 Emotional lability: Secondary | ICD-10-CM | POA: Diagnosis not present

## 2020-10-05 DIAGNOSIS — Z79899 Other long term (current) drug therapy: Secondary | ICD-10-CM | POA: Diagnosis not present

## 2020-10-05 DIAGNOSIS — Z85820 Personal history of malignant melanoma of skin: Secondary | ICD-10-CM | POA: Diagnosis not present

## 2020-10-05 DIAGNOSIS — Z5112 Encounter for antineoplastic immunotherapy: Secondary | ICD-10-CM | POA: Diagnosis not present

## 2020-10-05 DIAGNOSIS — Z5111 Encounter for antineoplastic chemotherapy: Secondary | ICD-10-CM | POA: Insufficient documentation

## 2020-10-05 DIAGNOSIS — M810 Age-related osteoporosis without current pathological fracture: Secondary | ICD-10-CM | POA: Diagnosis not present

## 2020-10-05 LAB — CBC WITH DIFFERENTIAL (CANCER CENTER ONLY)
Abs Immature Granulocytes: 0 10*3/uL (ref 0.00–0.07)
Basophils Absolute: 0 10*3/uL (ref 0.0–0.1)
Basophils Relative: 1 %
Eosinophils Absolute: 0 10*3/uL (ref 0.0–0.5)
Eosinophils Relative: 2 %
HCT: 36.2 % (ref 36.0–46.0)
Hemoglobin: 11.9 g/dL — ABNORMAL LOW (ref 12.0–15.0)
Immature Granulocytes: 0 %
Lymphocytes Relative: 26 %
Lymphs Abs: 0.5 10*3/uL — ABNORMAL LOW (ref 0.7–4.0)
MCH: 32.4 pg (ref 26.0–34.0)
MCHC: 32.9 g/dL (ref 30.0–36.0)
MCV: 98.6 fL (ref 80.0–100.0)
Monocytes Absolute: 0.3 10*3/uL (ref 0.1–1.0)
Monocytes Relative: 16 %
Neutro Abs: 1.1 10*3/uL — ABNORMAL LOW (ref 1.7–7.7)
Neutrophils Relative %: 55 %
Platelet Count: 279 10*3/uL (ref 150–400)
RBC: 3.67 MIL/uL — ABNORMAL LOW (ref 3.87–5.11)
RDW: 14.2 % (ref 11.5–15.5)
WBC Count: 2 10*3/uL — ABNORMAL LOW (ref 4.0–10.5)
nRBC: 0 % (ref 0.0–0.2)

## 2020-10-05 LAB — TYPE AND SCREEN
ABO/RH(D): A POS
Antibody Screen: POSITIVE

## 2020-10-05 LAB — CMP (CANCER CENTER ONLY)
ALT: 11 U/L (ref 0–44)
AST: 9 U/L — ABNORMAL LOW (ref 15–41)
Albumin: 4 g/dL (ref 3.5–5.0)
Alkaline Phosphatase: 140 U/L — ABNORMAL HIGH (ref 38–126)
Anion gap: 8 (ref 5–15)
BUN: 13 mg/dL (ref 6–20)
CO2: 23 mmol/L (ref 22–32)
Calcium: 8.3 mg/dL — ABNORMAL LOW (ref 8.9–10.3)
Chloride: 110 mmol/L (ref 98–111)
Creatinine: 0.6 mg/dL (ref 0.44–1.00)
GFR, Estimated: >60 mL/min (ref >60)
Glucose, Bld: 97 mg/dL (ref 70–99)
Potassium: 4.2 mmol/L (ref 3.5–5.1)
Sodium: 141 mmol/L (ref 135–145)
Total Bilirubin: 0.4 mg/dL (ref 0.3–1.2)
Total Protein: 5.7 g/dL — ABNORMAL LOW (ref 6.5–8.1)

## 2020-10-05 MED ORDER — OXYCODONE HCL 5 MG PO TABS: 5.0000 mg | ORAL_TABLET | ORAL | 0 refills | Status: DC | PRN

## 2020-10-05 MED ORDER — DEXAMETHASONE 4 MG PO TABS
20.0000 mg | ORAL_TABLET | Freq: Once | ORAL | Status: AC
  Administered 2020-10-05: 20 mg via ORAL

## 2020-10-05 MED ORDER — BORTEZOMIB CHEMO SQ INJECTION 3.5 MG (2.5MG/ML)
1.3000 mg/m2 | Freq: Once | INTRAMUSCULAR | Status: AC
  Administered 2020-10-05: 2.5 mg via SUBCUTANEOUS
  Filled 2020-10-05: qty 1

## 2020-10-05 MED ORDER — MONTELUKAST SODIUM 10 MG PO TABS
10.0000 mg | ORAL_TABLET | Freq: Once | ORAL | Status: AC
  Administered 2020-10-05: 10 mg via ORAL
  Filled 2020-10-05: qty 1

## 2020-10-05 MED ORDER — DIPHENHYDRAMINE HCL 25 MG PO CAPS
50.0000 mg | ORAL_CAPSULE | Freq: Once | ORAL | Status: AC
  Administered 2020-10-05: 50 mg via ORAL

## 2020-10-05 MED ORDER — DARATUMUMAB-HYALURONIDASE-FIHJ 1800-30000 MG-UT/15ML ~~LOC~~ SOLN
1800.0000 mg | Freq: Once | SUBCUTANEOUS | Status: AC
  Administered 2020-10-05: 1800 mg via SUBCUTANEOUS
  Filled 2020-10-05: qty 15

## 2020-10-05 MED ORDER — ACETAMINOPHEN 325 MG PO TABS
650.0000 mg | ORAL_TABLET | Freq: Once | ORAL | Status: AC
  Administered 2020-10-05: 650 mg via ORAL

## 2020-10-05 NOTE — Patient Instructions (Signed)
Bortezomib injection What is this medicine? BORTEZOMIB (bor TEZ oh mib) targets proteins in cancer cells and stops the cancer cells from growing. It treats multiple myeloma and mantle cell lymphoma. This medicine may be used for other purposes; ask your health care provider or pharmacist if you have questions. COMMON BRAND NAME(S): Velcade What should I tell my health care provider before I take this medicine? They need to know if you have any of these conditions:  dehydration  diabetes (high blood sugar)  heart disease  liver disease  tingling of the fingers or toes or other nerve disorder  an unusual or allergic reaction to bortezomib, mannitol, boron, other medicines, foods, dyes, or preservatives  pregnant or trying to get pregnant  breast-feeding How should I use this medicine? This medicine is injected into a vein or under the skin. It is given by a health care provider in a hospital or clinic setting. Talk to your health care provider about the use of this medicine in children. Special care may be needed. Overdosage: If you think you have taken too much of this medicine contact a poison control center or emergency room at once. NOTE: This medicine is only for you. Do not share this medicine with others. What if I miss a dose? Keep appointments for follow-up doses. It is important not to miss your dose. Call your health care provider if you are unable to keep an appointment. What may interact with this medicine? This medicine may interact with the following medications:  ketoconazole  rifampin This list may not describe all possible interactions. Give your health care provider a list of all the medicines, herbs, non-prescription drugs, or dietary supplements you use. Also tell them if you smoke, drink alcohol, or use illegal drugs. Some items may interact with your medicine. What should I watch for while using this medicine? Your condition will be monitored carefully while  you are receiving this medicine. You may need blood work done while you are taking this medicine. You may get drowsy or dizzy. Do not drive, use machinery, or do anything that needs mental alertness until you know how this medicine affects you. Do not stand up or sit up quickly, especially if you are an older patient. This reduces the risk of dizzy or fainting spells This medicine may increase your risk of getting an infection. Call your health care provider for advice if you get a fever, chills, sore throat, or other symptoms of a cold or flu. Do not treat yourself. Try to avoid being around people who are sick. Check with your health care provider if you have severe diarrhea, nausea, and vomiting, or if you sweat a lot. The loss of too much body fluid may make it dangerous for you to take this medicine. Do not become pregnant while taking this medicine or for 7 months after stopping it. Women should inform their health care provider if they wish to become pregnant or think they might be pregnant. Men should not father a child while taking this medicine and for 4 months after stopping it. There is a potential for serious harm to an unborn child. Talk to your health care provider for more information. Do not breast-feed an infant while taking this medicine or for 2 months after stopping it. This medicine may make it more difficult to get pregnant or father a child. Talk to your health care provider if you are concerned about your fertility. What side effects may I notice from receiving this medicine?   Side effects that you should report to your doctor or health care professional as soon as possible: °· allergic reactions (skin rash; itching or hives; swelling of the face, lips, or tongue) °· bleeding (bloody or black, tarry stools; red or dark brown urine; spitting up blood or brown material that looks like coffee grounds; red spots on the skin; unusual bruising or bleeding from the eye, gums, or  nose) °· blurred vision or changes in vision °· confusion °· constipation °· headache °· heart failure (trouble breathing; fast, irregular heartbeat; sudden weight gain; swelling of the ankles, feet, hands) °· infection (fever, chills, cough, sore throat, pain or trouble passing urine) °· lack or loss of appetite °· liver injury (dark yellow or brown urine; general ill feeling or flu-like symptoms; loss of appetite, right upper belly pain; yellowing of the eyes or skin) °· low blood pressure (dizziness; feeling faint or lightheaded, falls; unusually weak or tired) °· muscle cramps °· pain, redness, or irritation at site where injected °· pain, tingling, numbness in the hands or feet °· seizures °· trouble breathing °· unusual bruising or bleeding °Side effects that usually do not require medical attention (report to your doctor or health care professional if they continue or are bothersome): °· diarrhea °· nausea °· stomach pain °· trouble sleeping °· vomiting °This list may not describe all possible side effects. Call your doctor for medical advice about side effects. You may report side effects to FDA at 1-800-FDA-1088. °Where should I keep my medicine? °This medicine is given in a hospital or clinic. It will not be stored at home. °NOTE: This sheet is a summary. It may not cover all possible information. If you have questions about this medicine, talk to your doctor, pharmacist, or health care provider. °© 2021 [employer] (2020-06-11 13:22:53) °Daratumumab injection °What is this medicine? °DARATUMUMAB (dar a toom ue mab) is a monoclonal antibody. It is used to treat multiple myeloma. °This medicine may be used for other purposes; ask your health care provider or pharmacist if you have questions. °COMMON BRAND NAME(S): DARZALEX °What should I tell my health care provider before I take this medicine? °They need to know if you have any of these conditions: °· hereditary fructose intolerance °· infection  (especially a virus infection such as chickenpox, herpes, or hepatitis B virus) °· lung or breathing disease (asthma, COPD) °· an unusual or allergic reaction to daratumumab, sorbitol, other medicines, foods, dyes, or preservatives °· pregnant or trying to get pregnant °· breast-feeding °How should I use this medicine? °This medicine is for infusion into a vein. It is given by a health care professional in a hospital or clinic setting. °Talk to your pediatrician regarding the use of this medicine in children. Special care may be needed. °Overdosage: If you think you have taken too much of this medicine contact a poison control center or emergency room at once. °NOTE: This medicine is only for you. Do not share this medicine with others. °What if I miss a dose? °Keep appointments for follow-up doses as directed. It is important not to miss your dose. Call your doctor or health care professional if you are unable to keep an appointment. °What may interact with this medicine? °Interactions have not been studied. °This list may not describe all possible interactions. Give your health care provider a list of all the medicines, herbs, non-prescription drugs, or dietary supplements you use. Also tell them if you smoke, drink alcohol, or use illegal drugs. Some items may   interact with your medicine. °What should I watch for while using this medicine? °Your condition will be monitored carefully while you are receiving this medicine. °This medicine can cause serious allergic reactions. To reduce your risk, your health care provider may give you other medicine to take before receiving this one. Be sure to follow the directions from your health care provider. °This medicine can affect the results of blood tests to match your blood type. These changes can last for up to 6 months after the final dose. Your healthcare provider will do blood tests to match your blood type before you start treatment. Tell all of your healthcare  providers that you are being treated with this medicine before receiving a blood transfusion. °This medicine can affect the results of some tests used to determine treatment response; extra tests may be needed to evaluate response. °Do not become pregnant while taking this medicine or for 3 months after stopping it. Women should inform their health care provider if they wish to become pregnant or think they might be pregnant. There is a potential for serious side effects to an unborn child. Talk to your health care provider for more information. Do not breast-feed an infant while taking this medicine. °What side effects may I notice from receiving this medicine? °Side effects that you should report to your doctor or health care professional as soon as possible: °· allergic reactions (skin rash; itching or hives; swelling of the face, lips, or tongue) °· infection (fever, chills, cough, sore throat, pain or difficulty passing urine) °· infusion reaction (dizziness, fast heartbeat, feeling faint or lightheaded, falls, headache, increase in blood pressure, nausea, vomiting, or wheezing or trouble breathing with loud or whistling sounds) °· unusual bleeding or bruising °Side effects that usually do not require medical attention (report to your doctor or health care professional if they continue or are bothersome): °· constipation °· diarrhea °· pain, tingling, numbness in the hands or feet °· swelling of the ankles, feet, hands °· tiredness °This list may not describe all possible side effects. Call your doctor for medical advice about side effects. You may report side effects to FDA at 1-800-FDA-1088. °Where should I keep my medicine? °This drug is given in a hospital or clinic and will not be stored at home. °NOTE: This sheet is a summary. It may not cover all possible information. If you have questions about this medicine, talk to your doctor, pharmacist, or health care provider. °© 2021 [employer] Standard  (2020-06-11 13:28:52) ° °

## 2020-10-05 NOTE — Progress Notes (Signed)
Okay to treat with labs today per Dr Benay Spice

## 2020-10-05 NOTE — Telephone Encounter (Signed)
Return call to Ms. Hausler from v/m message stating should she take her revlimid because her WBC were low. Informed her that per Dr. Benay Spice she should start taking her revlimid tonight. Pt. Verbalized understanding.

## 2020-10-05 NOTE — Telephone Encounter (Signed)
ERROR

## 2020-10-08 ENCOUNTER — Other Ambulatory Visit: Payer: Self-pay | Admitting: *Deleted

## 2020-10-08 DIAGNOSIS — C9000 Multiple myeloma not having achieved remission: Secondary | ICD-10-CM

## 2020-10-09 ENCOUNTER — Ambulatory Visit: Payer: 59 | Admitting: Oncology

## 2020-10-09 ENCOUNTER — Other Ambulatory Visit: Payer: 59

## 2020-10-09 ENCOUNTER — Ambulatory Visit: Payer: 59

## 2020-10-11 ENCOUNTER — Other Ambulatory Visit: Payer: Self-pay | Admitting: Oncology

## 2020-10-12 ENCOUNTER — Encounter: Payer: Self-pay | Admitting: Nurse Practitioner

## 2020-10-12 ENCOUNTER — Other Ambulatory Visit: Payer: Self-pay

## 2020-10-12 ENCOUNTER — Other Ambulatory Visit: Payer: Self-pay | Admitting: Nurse Practitioner

## 2020-10-12 ENCOUNTER — Inpatient Hospital Stay (HOSPITAL_BASED_OUTPATIENT_CLINIC_OR_DEPARTMENT_OTHER): Payer: 59 | Admitting: Nurse Practitioner

## 2020-10-12 ENCOUNTER — Inpatient Hospital Stay: Payer: 59

## 2020-10-12 ENCOUNTER — Telehealth: Payer: Self-pay | Admitting: Nurse Practitioner

## 2020-10-12 ENCOUNTER — Ambulatory Visit (HOSPITAL_BASED_OUTPATIENT_CLINIC_OR_DEPARTMENT_OTHER)
Admission: RE | Admit: 2020-10-12 | Discharge: 2020-10-12 | Disposition: A | Discharge status: Home / Self Care | Payer: 59 | Source: Ambulatory Visit | Attending: Nurse Practitioner | Admitting: Nurse Practitioner

## 2020-10-12 VITALS — BP 105/66 | HR 60 | Temp 98.7°F | Resp 16 | Ht 65.0 in | Wt 165.8 lb

## 2020-10-12 DIAGNOSIS — C9000 Multiple myeloma not having achieved remission: Secondary | ICD-10-CM

## 2020-10-12 DIAGNOSIS — M25551 Pain in right hip: Secondary | ICD-10-CM | POA: Diagnosis not present

## 2020-10-12 LAB — CBC WITH DIFFERENTIAL (CANCER CENTER ONLY)
Abs Immature Granulocytes: 0.02 10*3/uL (ref 0.00–0.07)
Basophils Absolute: 0.1 10*3/uL (ref 0.0–0.1)
Basophils Relative: 2 %
Eosinophils Absolute: 0.1 10*3/uL (ref 0.0–0.5)
Eosinophils Relative: 2 %
HCT: 36 % (ref 36.0–46.0)
Hemoglobin: 12.1 g/dL (ref 12.0–15.0)
Immature Granulocytes: 1 %
Lymphocytes Relative: 19 %
Lymphs Abs: 0.5 10*3/uL — ABNORMAL LOW (ref 0.7–4.0)
MCH: 32.4 pg (ref 26.0–34.0)
MCHC: 33.6 g/dL (ref 30.0–36.0)
MCV: 96.3 fL (ref 80.0–100.0)
Monocytes Absolute: 0.2 10*3/uL (ref 0.1–1.0)
Monocytes Relative: 7 %
Neutro Abs: 2 10*3/uL (ref 1.7–7.7)
Neutrophils Relative %: 69 %
Platelet Count: 244 10*3/uL (ref 150–400)
RBC: 3.74 MIL/uL — ABNORMAL LOW (ref 3.87–5.11)
RDW: 13.4 % (ref 11.5–15.5)
WBC Count: 2.9 10*3/uL — ABNORMAL LOW (ref 4.0–10.5)
nRBC: 0 % (ref 0.0–0.2)

## 2020-10-12 MED ORDER — DIPHENHYDRAMINE HCL 25 MG PO CAPS
50.0000 mg | ORAL_CAPSULE | Freq: Once | ORAL | Status: AC
  Administered 2020-10-12: 50 mg via ORAL
  Filled 2020-10-12: qty 2

## 2020-10-12 MED ORDER — DEXAMETHASONE 4 MG PO TABS
20.0000 mg | ORAL_TABLET | Freq: Once | ORAL | Status: AC
Start: 2020-10-12 — End: 2020-10-12
  Administered 2020-10-12: 20 mg via ORAL

## 2020-10-12 MED ORDER — DARATUMUMAB-HYALURONIDASE-FIHJ 1800-30000 MG-UT/15ML ~~LOC~~ SOLN
1800.0000 mg | Freq: Once | SUBCUTANEOUS | Status: AC
  Administered 2020-10-12: 1800 mg via SUBCUTANEOUS
  Filled 2020-10-12: qty 15

## 2020-10-12 MED ORDER — DEXAMETHASONE 4 MG PO TABS
20.0000 mg | ORAL_TABLET | Freq: Once | ORAL | Status: AC
  Administered 2020-10-12: 20 mg via ORAL
  Filled 2020-10-12: qty 5

## 2020-10-12 MED ORDER — BORTEZOMIB CHEMO SQ INJECTION 3.5 MG (2.5MG/ML)
1.3000 mg/m2 | Freq: Once | INTRAMUSCULAR | Status: AC
  Administered 2020-10-12: 2.5 mg via SUBCUTANEOUS
  Filled 2020-10-12: qty 1

## 2020-10-12 MED ORDER — LENALIDOMIDE 25 MG PO CAPS: 25.0000 mg | ORAL_CAPSULE | Freq: Every day | ORAL | 0 refills | Status: DC

## 2020-10-12 MED ORDER — MONTELUKAST SODIUM 10 MG PO TABS
10.0000 mg | ORAL_TABLET | Freq: Once | ORAL | Status: AC
  Administered 2020-10-12: 10 mg via ORAL
  Filled 2020-10-12: qty 1

## 2020-10-12 MED ORDER — ACETAMINOPHEN 325 MG PO TABS
650.0000 mg | ORAL_TABLET | Freq: Once | ORAL | Status: AC
Start: 2020-10-12 — End: 2020-10-12
  Administered 2020-10-12: 650 mg via ORAL
  Filled 2020-10-12: qty 2

## 2020-10-12 NOTE — Progress Notes (Addendum)
[facility] OFFICE PROGRESS NOTE   Diagnosis: Multiple myeloma  INTERVAL HISTORY:   Katherine Beard returns as scheduled.  She began cycle 2 RVD plus daratumumab 10/05/2020.  She denies nausea/vomiting.  No mouth sores.  She had mild diarrhea last week.  Back pain overall is better.  Still some "achiness".  She notes increased pain at the right hip.  This mainly occurs with ambulation.  Level of pain varies.  She continues oxycodone and methocarbamol as needed.  She has had some wheezing since initial compression fractures.  She denies any increase in wheezing/shortness of breath after daratumumab.  The day after each treatment she notices mild erythema face and chest.  Lower leg edema is better.  Objective:  Vital signs in last 24 hours:  Blood pressure 105/66, pulse 60, temperature 98.7 F (37.1 C), resp. rate 16, height _0  (1.651 m), weight 165 lb 12.8 oz (75.2 kg), last menstrual period 03/06/2011, SpO2 100 %.    HEENT: No thrush or ulcers. Resp: Lungs clear bilaterally. Cardio: Regular rate and rhythm. GI: No hepatosplenomegaly. Vascular: No leg edema.  Lab Results:  Lab Results  Component Value Date   WBC 2.9 (L) 10/12/2020   HGB 12.1 10/12/2020   HCT 36.0 10/12/2020   MCV 96.3 10/12/2020   PLT 244 10/12/2020   NEUTROABS 2.0 10/12/2020    Imaging:  No results found.  Medications: I have reviewed the patient's current medications.  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 Severe back pain ? MRI lumbar spine July 12, 2020-subacute compression fractures at T12 and L1 ? MRI lumbar spine July 30, 2020-subacute T12 and L1 compression fractures, new subacute superior L2 endplate deformity ? MRI lumbar spine September 01, 2020 new/progressive compression fractures at L2 and L3, acute compression fracture at L4, evolution of T12 and L1 compression fractures ? MRI pelvis September 01, 2020-innumerable small foci of signal abnormality in the pelvis and femurs, no fracture identified, concerning for multiple myeloma versus metastases ? Zometa beginning 09/21/2020-plan monthly x3 then every 3 months, next due 10/19/2020 3. Melanoma left upper back 2014 4. History of diverticulitis 5. Mild hypercalcemiaat presentation-resolved 6. Osteoporosis 7. Evusheld  Disposition: Ms. Moylan appears stable.  She began cycle 2 RVD plus daratumumab 10/05/2020.  Clinically she appears improved.  Plan to proceed with Velcade and week 3 daratumumab today as scheduled.  Repeat myeloma labs with the start of cycle 3 on 10/26/2020.  We reviewed the CBC from today.  Counts adequate to proceed as above.  She will have an x-ray of the right hip today to evaluate complaint of increased pain especially with ambulation.  Etiology of hair loss is unclear.  She would like to try some supplements.  She will forward a list of the supplements to our office before beginning.  We made a physical therapy referral.  She will return in 1 week for Velcade/daratumumab and Zometa.  We will see her in follow-up prior to beginning cycle 3  RVD plus daratumumab on 10/26/2020.  She will contact the office in the interim with any problems.  Patient seen with Dr. Benay Spice.    Ned Card ANP/GNP-BC   10/12/2020  8:12 AM This was a shared visit with Ned Card.  Ms. Burford was interviewed and  examined.  She appears to be tolerating the systemic therapy well.  Her pain is generally improved, but she has developed increased pain at the right "hip ".  We will obtain a plain x-ray today.  I was present for and 50% of today's visit.  I performed medical decision making.  Julieanne Manson, MD

## 2020-10-12 NOTE — Telephone Encounter (Signed)
Revlimid refill start 10/26/20

## 2020-10-12 NOTE — Patient Instructions (Signed)
[facility] Discharge Instructions for Patients Receiving Chemotherapy  Today you received the following chemotherapy agents Bortezomib (VELCADE) & Daratumumab-hyaluronidase-fijy (DARZALEX FASPRO).  To help prevent nausea and vomiting after your treatment, we encourage you to take your nausea medication as prescribed.   If you develop nausea and vomiting that is not controlled by your nausea medication, call the clinic.   BELOW ARE SYMPTOMS THAT SHOULD BE REPORTED IMMEDIATELY:  *FEVER GREATER THAN 100.5 F  *CHILLS WITH OR WITHOUT FEVER  NAUSEA AND VOMITING THAT IS NOT CONTROLLED WITH YOUR NAUSEA MEDICATION  *UNUSUAL SHORTNESS OF BREATH  *UNUSUAL BRUISING OR BLEEDING  TENDERNESS IN MOUTH AND THROAT WITH OR WITHOUT PRESENCE OF ULCERS  *URINARY PROBLEMS  *BOWEL PROBLEMS  UNUSUAL RASH Items with * indicate a potential emergency and should be followed up as soon as possible.  Feel free to call the clinic should you have any questions or concerns at The clinic phone number is [phone].  Please show the [facility] at check-in to the Emergency Department and triage nurse.

## 2020-10-12 NOTE — Telephone Encounter (Signed)
Scheduled appt per 4/11 los   - gave patient AVS and calender in infusion.

## 2020-10-14 ENCOUNTER — Telehealth: Payer: Self-pay

## 2020-10-14 ENCOUNTER — Other Ambulatory Visit: Payer: Self-pay | Admitting: Oncology

## 2020-10-14 ENCOUNTER — Other Ambulatory Visit: Payer: Self-pay

## 2020-10-14 NOTE — Telephone Encounter (Signed)
Returned call to pt for several points of discussion 1. Pt wanted to know if she could take several OTC supplements for hair skin nails, this was reviewed with pharmacists who states it was ok with no adverse reaction with treatment 2. Pt called for a more detailed explanation of xray, further explanation given pt satisfied no further questions 3.robaxin refill requested and completed 4.inquired about a discount card for revlimid this nurse needs to make several phone calls regarding this explained to pt I will follow-up with her once I figure out who provides discount cards 5. Pt wanted to confirm referral for PT was completed provider has sent in referral I confirmed it was received and that department will call and schedule once insurance is confirmed and what pt can qualify for is complete

## 2020-10-15 ENCOUNTER — Other Ambulatory Visit: Payer: Self-pay | Admitting: Oncology

## 2020-10-19 ENCOUNTER — Other Ambulatory Visit: Payer: Self-pay | Admitting: Oncology

## 2020-10-19 ENCOUNTER — Inpatient Hospital Stay (HOSPITAL_BASED_OUTPATIENT_CLINIC_OR_DEPARTMENT_OTHER): Payer: 59 | Admitting: Oncology

## 2020-10-19 ENCOUNTER — Inpatient Hospital Stay: Payer: 59

## 2020-10-19 ENCOUNTER — Other Ambulatory Visit: Payer: Self-pay

## 2020-10-19 VITALS — BP 127/73 | HR 51 | Temp 98.1°F | Resp 20

## 2020-10-19 DIAGNOSIS — C9000 Multiple myeloma not having achieved remission: Secondary | ICD-10-CM

## 2020-10-19 LAB — CBC WITH DIFFERENTIAL (CANCER CENTER ONLY)
Abs Immature Granulocytes: 0.03 10*3/uL (ref 0.00–0.07)
Basophils Absolute: 0.1 10*3/uL (ref 0.0–0.1)
Basophils Relative: 2 %
Eosinophils Absolute: 0.1 10*3/uL (ref 0.0–0.5)
Eosinophils Relative: 1 %
HCT: 39.4 % (ref 36.0–46.0)
Hemoglobin: 13 g/dL (ref 12.0–15.0)
Immature Granulocytes: 1 %
Lymphocytes Relative: 15 %
Lymphs Abs: 0.6 10*3/uL — ABNORMAL LOW (ref 0.7–4.0)
MCH: 31.3 pg (ref 26.0–34.0)
MCHC: 33 g/dL (ref 30.0–36.0)
MCV: 94.7 fL (ref 80.0–100.0)
Monocytes Absolute: 0.6 10*3/uL (ref 0.1–1.0)
Monocytes Relative: 15 %
Neutro Abs: 2.6 10*3/uL (ref 1.7–7.7)
Neutrophils Relative %: 66 %
Platelet Count: 203 10*3/uL (ref 150–400)
RBC: 4.16 MIL/uL (ref 3.87–5.11)
RDW: 13.4 % (ref 11.5–15.5)
WBC Count: 3.9 10*3/uL — ABNORMAL LOW (ref 4.0–10.5)
nRBC: 0 % (ref 0.0–0.2)

## 2020-10-19 MED ORDER — DEXAMETHASONE 4 MG PO TABS
ORAL_TABLET | ORAL | Status: AC
  Filled 2020-10-19: qty 10

## 2020-10-19 MED ORDER — ACETAMINOPHEN 325 MG PO TABS
650.0000 mg | ORAL_TABLET | Freq: Once | ORAL | Status: AC
Start: 2020-10-19 — End: 2020-10-19
  Administered 2020-10-19: 650 mg via ORAL

## 2020-10-19 MED ORDER — SODIUM CHLORIDE 0.9 % IV SOLN
Freq: Once | INTRAVENOUS | Status: AC
  Filled 2020-10-19: qty 250

## 2020-10-19 MED ORDER — ZOLEDRONIC ACID 4 MG/100ML IV SOLN
4.0000 mg | Freq: Once | INTRAVENOUS | Status: AC
  Administered 2020-10-19: 4 mg via INTRAVENOUS
  Filled 2020-10-19: qty 100

## 2020-10-19 MED ORDER — DEXAMETHASONE 4 MG PO TABS
40.0000 mg | ORAL_TABLET | Freq: Once | ORAL | Status: AC
  Administered 2020-10-19: 40 mg via ORAL

## 2020-10-19 MED ORDER — DIPHENHYDRAMINE HCL 25 MG PO CAPS
50.0000 mg | ORAL_CAPSULE | Freq: Once | ORAL | Status: AC
Start: 2020-10-19 — End: 2020-10-19
  Administered 2020-10-19: 50 mg via ORAL

## 2020-10-19 MED ORDER — DIPHENHYDRAMINE HCL 25 MG PO CAPS
ORAL_CAPSULE | ORAL | Status: AC
  Filled 2020-10-19: qty 2

## 2020-10-19 MED ORDER — BORTEZOMIB CHEMO SQ INJECTION 3.5 MG (2.5MG/ML)
1.3000 mg/m2 | Freq: Once | INTRAMUSCULAR | Status: AC
  Administered 2020-10-19: 2.5 mg via SUBCUTANEOUS
  Filled 2020-10-19: qty 1

## 2020-10-19 MED ORDER — DARATUMUMAB-HYALURONIDASE-FIHJ 1800-30000 MG-UT/15ML ~~LOC~~ SOLN
1800.0000 mg | Freq: Once | SUBCUTANEOUS | Status: AC
  Administered 2020-10-19: 1800 mg via SUBCUTANEOUS
  Filled 2020-10-19: qty 15

## 2020-10-19 MED ORDER — ACETAMINOPHEN 325 MG PO TABS
ORAL_TABLET | ORAL | Status: AC
  Filled 2020-10-19: qty 2

## 2020-10-19 MED ORDER — OXYCODONE HCL 5 MG PO TABS: 5.0000 mg | ORAL_TABLET | ORAL | 0 refills | Status: DC | PRN

## 2020-10-19 NOTE — Progress Notes (Signed)
Okay to treat patient today using 10/06/19 cmp results.

## 2020-10-22 ENCOUNTER — Telehealth: Payer: Self-pay

## 2020-10-22 NOTE — Telephone Encounter (Signed)
TC from Pt who stated " She is not feeling right today.she stated she is feeling internal trembling and emotional. Explained to Pt that she received decadron on day of treatment and those are side effects from the decadron. Discussed Pt's complaints with Dr Benay Spice who concurred with my explanation to the Pt. Pt verbalized understanding and is requesting something to possibly help her sleep. She is currently took valium and tylenol PM.

## 2020-10-25 ENCOUNTER — Other Ambulatory Visit: Payer: Self-pay | Admitting: Oncology

## 2020-10-26 ENCOUNTER — Other Ambulatory Visit: Payer: Self-pay

## 2020-10-26 ENCOUNTER — Telehealth: Payer: Self-pay | Admitting: Oncology

## 2020-10-26 ENCOUNTER — Inpatient Hospital Stay (HOSPITAL_BASED_OUTPATIENT_CLINIC_OR_DEPARTMENT_OTHER): Payer: 59 | Admitting: Oncology

## 2020-10-26 ENCOUNTER — Inpatient Hospital Stay: Payer: 59

## 2020-10-26 VITALS — BP 143/67 | HR 61 | Temp 97.8°F | Resp 20 | Ht 65.0 in | Wt 164.8 lb

## 2020-10-26 DIAGNOSIS — C9000 Multiple myeloma not having achieved remission: Secondary | ICD-10-CM | POA: Diagnosis not present

## 2020-10-26 LAB — CMP (CANCER CENTER ONLY)
ALT: 11 U/L (ref 0–44)
AST: 9 U/L — ABNORMAL LOW (ref 15–41)
Albumin: 4.1 g/dL (ref 3.5–5.0)
Alkaline Phosphatase: 98 U/L (ref 38–126)
Anion gap: 8 (ref 5–15)
BUN: 15 mg/dL (ref 6–20)
CO2: 24 mmol/L (ref 22–32)
Calcium: 8.5 mg/dL — ABNORMAL LOW (ref 8.9–10.3)
Chloride: 106 mmol/L (ref 98–111)
Creatinine: 0.52 mg/dL (ref 0.44–1.00)
GFR, Estimated: >60 mL/min (ref >60)
Glucose, Bld: 96 mg/dL (ref 70–99)
Potassium: 3.8 mmol/L (ref 3.5–5.1)
Sodium: 138 mmol/L (ref 135–145)
Total Bilirubin: 0.3 mg/dL (ref 0.3–1.2)
Total Protein: 5.8 g/dL — ABNORMAL LOW (ref 6.5–8.1)

## 2020-10-26 LAB — CBC WITH DIFFERENTIAL (CANCER CENTER ONLY)
Abs Immature Granulocytes: 0.01 10*3/uL (ref 0.00–0.07)
Basophils Absolute: 0 10*3/uL (ref 0.0–0.1)
Basophils Relative: 1 %
Eosinophils Absolute: 0 10*3/uL (ref 0.0–0.5)
Eosinophils Relative: 1 %
HCT: 39.1 % (ref 36.0–46.0)
Hemoglobin: 12.8 g/dL (ref 12.0–15.0)
Immature Granulocytes: 0 %
Lymphocytes Relative: 17 %
Lymphs Abs: 0.7 10*3/uL (ref 0.7–4.0)
MCH: 31.5 pg (ref 26.0–34.0)
MCHC: 32.7 g/dL (ref 30.0–36.0)
MCV: 96.3 fL (ref 80.0–100.0)
Monocytes Absolute: 0.6 10*3/uL (ref 0.1–1.0)
Monocytes Relative: 14 %
Neutro Abs: 2.7 10*3/uL (ref 1.7–7.7)
Neutrophils Relative %: 67 %
Platelet Count: 259 10*3/uL (ref 150–400)
RBC: 4.06 MIL/uL (ref 3.87–5.11)
RDW: 13.5 % (ref 11.5–15.5)
WBC Count: 4.1 10*3/uL (ref 4.0–10.5)
nRBC: 0 % (ref 0.0–0.2)

## 2020-10-26 MED ORDER — ACETAMINOPHEN 325 MG PO TABS
650.0000 mg | ORAL_TABLET | Freq: Once | ORAL | Status: AC
Start: 2020-10-26 — End: 2020-10-26
  Administered 2020-10-26: 650 mg via ORAL
  Filled 2020-10-26: qty 2

## 2020-10-26 MED ORDER — DIPHENHYDRAMINE HCL 25 MG PO CAPS
50.0000 mg | ORAL_CAPSULE | Freq: Once | ORAL | Status: AC
  Administered 2020-10-26: 50 mg via ORAL
  Filled 2020-10-26: qty 2

## 2020-10-26 MED ORDER — DARATUMUMAB-HYALURONIDASE-FIHJ 1800-30000 MG-UT/15ML ~~LOC~~ SOLN
1800.0000 mg | Freq: Once | SUBCUTANEOUS | Status: AC
  Administered 2020-10-26: 1800 mg via SUBCUTANEOUS
  Filled 2020-10-26: qty 15

## 2020-10-26 MED ORDER — DEXAMETHASONE 4 MG PO TABS
20.0000 mg | ORAL_TABLET | Freq: Once | ORAL | Status: AC
Start: 2020-10-26 — End: 2020-10-26
  Administered 2020-10-26: 20 mg via ORAL
  Filled 2020-10-26: qty 5

## 2020-10-26 MED ORDER — BORTEZOMIB CHEMO SQ INJECTION 3.5 MG (2.5MG/ML)
1.3000 mg/m2 | Freq: Once | INTRAMUSCULAR | Status: AC
  Administered 2020-10-26: 2.5 mg via SUBCUTANEOUS
  Filled 2020-10-26: qty 1

## 2020-10-26 NOTE — Patient Instructions (Signed)
[facility]   Discharge Instructions:  Thank you for choosing [facility] to provide your oncology and hematology care.   If you have a lab appointment with the [facility], please go directly to the [facility] and check in at the registration area.   Wear comfortable clothing and clothing appropriate for easy access to any Portacath or PICC line.   We strive to give you quality time with your provider. You may need to reschedule your appointment if you arrive late (15 or more minutes).  Arriving late affects you and other patients whose appointments are after yours.  Also, if you miss three or more appointments without notifying the office, you may be dismissed from the clinic at the provider's discretion.      For prescription refill requests, have your pharmacy contact our office and allow 72 hours for refills to be completed.    Today you received the following chemotherapy and/or immunotherapy agents Bortezomib (VELCADE) & Daratumumab-hyaluronidase (DARZALEX FASPRO).    To help prevent nausea and vomiting after your treatment, we encourage you to take your nausea medication as directed.  BELOW ARE SYMPTOMS THAT SHOULD BE REPORTED IMMEDIATELY: . *FEVER GREATER THAN 100.4 F (38 C) OR HIGHER . *CHILLS OR SWEATING . *NAUSEA AND VOMITING THAT IS NOT CONTROLLED WITH YOUR NAUSEA MEDICATION . *UNUSUAL SHORTNESS OF BREATH . *UNUSUAL BRUISING OR BLEEDING . *URINARY PROBLEMS (pain or burning when urinating, or frequent urination) . *BOWEL PROBLEMS (unusual diarrhea, constipation, pain near the anus) . TENDERNESS IN MOUTH AND THROAT WITH OR WITHOUT PRESENCE OF ULCERS (sore throat, sores in mouth, or a toothache) . UNUSUAL RASH, SWELLING OR PAIN  . UNUSUAL VAGINAL DISCHARGE OR ITCHING   Items with * indicate a potential emergency and should be followed up as soon as possible or go to the Emergency Department if any problems should occur.  Please  show the CHEMOTHERAPY ALERT CARD or IMMUNOTHERAPY ALERT CARD at check-in to the Emergency Department and triage nurse.  Should you have questions after your visit or need to cancel or reschedule your appointment, please contact [facility]  Dept: [phone]  and follow the prompts.  Office hours are 8:00 a.m. to 4:30 p.m. Monday - Friday. Please note that voicemails left after 4:00 p.m. may not be returned until the following business day.  We are closed weekends and major holidays. You have access to a nurse at all times for urgent questions. Please call the main number to the clinic Dept: [phone] and follow the prompts.   For any non-urgent questions, you may also contact your provider using MyChart. We now offer e-Visits for anyone 58 and older to request care online for non-urgent symptoms. For details visit [email or web address].   Also download the MyChart app! Go to the app store, search "MyChart", open the app, select [facility], and log in with your MyChart username and password.  Due to Covid, a mask is required upon entering the hospital/clinic. If you do not have a mask, one will be given to you upon arrival. For doctor visits, patients may have 1 support person aged 58 or older with them. For treatment visits, patients cannot have anyone with them due to current Covid guidelines and our immunocompromised population.

## 2020-10-26 NOTE — Progress Notes (Signed)
[facility] OFFICE PROGRESS NOTE   Diagnosis: Multiple myeloma  INTERVAL HISTORY:   Katherine Beard returns as scheduled.  She has completed 2 cycles of RVD.  Daratumumab was added with day 15 cycle 1.  No symptom or allergic reaction.  She complains of insomnia and emotional lability most of last week.  Back pain persists, but has improved.  She is more mobile.  She is scheduled to begin physical therapy.  Objective:  Vital signs in last 24 hours:  Blood pressure (!) 143/67, pulse 61, temperature 97.8 F (36.6 C), temperature source Oral, resp. rate 20, height _0  (1.651 m), weight 164 lb 12.8 oz (74.8 kg), last menstrual period 03/06/2011, SpO2 100 %.    HEENT: No thrush Resp: Lungs clear bilaterally Cardio: Regular rate and rhythm GI: No hepatosplenomegaly, nontender Vascular: No leg edema   Lab Results:  Lab Results  Component Value Date   WBC 4.1 10/26/2020   HGB 12.8 10/26/2020   HCT 39.1 10/26/2020   MCV 96.3 10/26/2020   PLT 259 10/26/2020   NEUTROABS 2.7 10/26/2020    CMP  Lab Results  Component Value Date   NA 138 10/26/2020   K 3.8 10/26/2020   CL 106 10/26/2020   CO2 24 10/26/2020   GLUCOSE 96 10/26/2020   BUN 15 10/26/2020   CREATININE 0.52 10/26/2020   CALCIUM 8.5 (L) 10/26/2020   PROT 5.8 (L) 10/26/2020   ALBUMIN 4.1 10/26/2020   AST 9 (L) 10/26/2020   ALT 11 10/26/2020   ALKPHOS 98 10/26/2020   BILITOT 0.3 10/26/2020   GFRNONAA >60 10/26/2020   GFRAA 78 12/11/2006    Medications: I have reviewed the patient's current medications.   Assessment/Plan: 1. 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 Severe back pain ? MRI lumbar spine July 12, 2020-subacute compression fractures at T12 and L1 ? MRI lumbar spine July 30, 2020-subacute T12 and L1 compression fractures, new subacute superior L2 endplate deformity ? MRI lumbar spine September 01, 2020 new/progressive compression fractures at L2 and L3, acute compression fracture at L4, evolution of T12 and L1 compression fractures ? MRI pelvis September 01, 2020-innumerable small foci of signal abnormality in the pelvis and femurs, no fracture identified, concerning for multiple myeloma versus metastases ? Zometa beginning 09/21/2020-plan monthly x3 then every 3 months, next due 10/19/2020 3. Melanoma left upper back 2014 4. History of diverticulitis 5. Mild hypercalcemiaat presentation-resolved 6. Osteoporosis 7. Evusheld    Disposition: Katherine Beard has multiple myeloma.  She has completed 2 cycles of RVD.  Daratumumab was added beginning day 15 of cycle 1.  She has tolerated the treatment well.  The insomnia and emotional lability last week were likely related to Decadron therapy.  The Decadron will be decreased to 20 mg weekly.  She continues aspirin and acyclovir prophylaxis.  She will use Valium as needed for insomnia.  Katherine Beard will begin another cycle of RVD today.  She continues monthly Zometa.  She will begin physical therapy.  Katherine Beard will return for an office and lab visit in 3 weeks.  We will follow up on the myeloma panel from today.  She is scheduled to see [name] on 11/10/2020.  [name], MD  10/26/2020  11:53 AM

## 2020-10-26 NOTE — Telephone Encounter (Signed)
Appointments scheduled with patient in infusion per 4/25 los 

## 2020-10-27 LAB — KAPPA/LAMBDA LIGHT CHAINS
Kappa free light chain: 7.3 mg/L (ref 3.3–19.4)
Kappa, lambda light chain ratio: 1.62 (ref 0.26–1.65)
Lambda free light chains: 4.5 mg/L — ABNORMAL LOW (ref 5.7–26.3)

## 2020-10-28 ENCOUNTER — Telehealth: Payer: Self-pay

## 2020-10-28 LAB — PROTEIN ELECTROPHORESIS, SERUM
A/G Ratio: 1.7 (ref 0.7–1.7)
Albumin ELP: 3.7 g/dL (ref 2.9–4.4)
Alpha-1-Globulin: 0.3 g/dL (ref 0.0–0.4)
Alpha-2-Globulin: 0.8 g/dL (ref 0.4–1.0)
Beta Globulin: 0.9 g/dL (ref 0.7–1.3)
Gamma Globulin: 0.3 g/dL — ABNORMAL LOW (ref 0.4–1.8)
Globulin, Total: 2.2 g/dL (ref 2.2–3.9)
M-Spike, %: 0.1 g/dL — ABNORMAL HIGH
Total Protein ELP: 5.9 g/dL — ABNORMAL LOW (ref 6.0–8.5)

## 2020-10-28 NOTE — Telephone Encounter (Signed)
Called spoke with pt reviewed lab results made her aware of most recent lab results and to follow up as scheduled

## 2020-10-29 ENCOUNTER — Other Ambulatory Visit: Payer: Self-pay | Admitting: *Deleted

## 2020-10-29 DIAGNOSIS — C9000 Multiple myeloma not having achieved remission: Secondary | ICD-10-CM

## 2020-11-01 ENCOUNTER — Other Ambulatory Visit: Payer: Self-pay | Admitting: Oncology

## 2020-11-02 ENCOUNTER — Inpatient Hospital Stay: Payer: 59

## 2020-11-02 ENCOUNTER — Inpatient Hospital Stay: Payer: 59 | Attending: Oncology

## 2020-11-02 ENCOUNTER — Other Ambulatory Visit: Payer: Self-pay

## 2020-11-02 VITALS — BP 110/63 | HR 57 | Temp 97.7°F | Resp 20

## 2020-11-02 DIAGNOSIS — M810 Age-related osteoporosis without current pathological fracture: Secondary | ICD-10-CM | POA: Diagnosis not present

## 2020-11-02 DIAGNOSIS — Z85820 Personal history of malignant melanoma of skin: Secondary | ICD-10-CM | POA: Diagnosis not present

## 2020-11-02 DIAGNOSIS — C9000 Multiple myeloma not having achieved remission: Secondary | ICD-10-CM | POA: Diagnosis present

## 2020-11-02 DIAGNOSIS — Z5112 Encounter for antineoplastic immunotherapy: Secondary | ICD-10-CM | POA: Diagnosis not present

## 2020-11-02 DIAGNOSIS — Z5111 Encounter for antineoplastic chemotherapy: Secondary | ICD-10-CM | POA: Diagnosis not present

## 2020-11-02 LAB — CBC WITH DIFFERENTIAL (CANCER CENTER ONLY)
Abs Immature Granulocytes: 0.01 10*3/uL (ref 0.00–0.07)
Basophils Absolute: 0.1 10*3/uL (ref 0.0–0.1)
Basophils Relative: 1 %
Eosinophils Absolute: 0.1 10*3/uL (ref 0.0–0.5)
Eosinophils Relative: 2 %
HCT: 41.6 % (ref 36.0–46.0)
Hemoglobin: 13.6 g/dL (ref 12.0–15.0)
Immature Granulocytes: 0 %
Lymphocytes Relative: 11 %
Lymphs Abs: 0.5 10*3/uL — ABNORMAL LOW (ref 0.7–4.0)
MCH: 31.2 pg (ref 26.0–34.0)
MCHC: 32.7 g/dL (ref 30.0–36.0)
MCV: 95.4 fL (ref 80.0–100.0)
Monocytes Absolute: 0.2 10*3/uL (ref 0.1–1.0)
Monocytes Relative: 5 %
Neutro Abs: 3.8 10*3/uL (ref 1.7–7.7)
Neutrophils Relative %: 81 %
Platelet Count: 259 10*3/uL (ref 150–400)
RBC: 4.36 MIL/uL (ref 3.87–5.11)
RDW: 13.3 % (ref 11.5–15.5)
WBC Count: 4.7 10*3/uL (ref 4.0–10.5)
nRBC: 0 % (ref 0.0–0.2)

## 2020-11-02 LAB — CMP (CANCER CENTER ONLY)
ALT: 11 U/L (ref 0–44)
AST: 9 U/L — ABNORMAL LOW (ref 15–41)
Albumin: 4.3 g/dL (ref 3.5–5.0)
Alkaline Phosphatase: 92 U/L (ref 38–126)
Anion gap: 9 (ref 5–15)
BUN: 13 mg/dL (ref 6–20)
CO2: 26 mmol/L (ref 22–32)
Calcium: 8.9 mg/dL (ref 8.9–10.3)
Chloride: 104 mmol/L (ref 98–111)
Creatinine: 0.66 mg/dL (ref 0.44–1.00)
GFR, Estimated: >60 mL/min (ref >60)
Glucose, Bld: 101 mg/dL — ABNORMAL HIGH (ref 70–99)
Potassium: 3.7 mmol/L (ref 3.5–5.1)
Sodium: 139 mmol/L (ref 135–145)
Total Bilirubin: 0.4 mg/dL (ref 0.3–1.2)
Total Protein: 5.9 g/dL — ABNORMAL LOW (ref 6.5–8.1)

## 2020-11-02 MED ORDER — DIPHENHYDRAMINE HCL 25 MG PO CAPS
50.0000 mg | ORAL_CAPSULE | Freq: Once | ORAL | Status: AC
Start: 2020-11-02 — End: 2020-11-02
  Administered 2020-11-02: 50 mg via ORAL
  Filled 2020-11-02: qty 2

## 2020-11-02 MED ORDER — DARATUMUMAB-HYALURONIDASE-FIHJ 1800-30000 MG-UT/15ML ~~LOC~~ SOLN
1800.0000 mg | Freq: Once | SUBCUTANEOUS | Status: AC
  Administered 2020-11-02: 1800 mg via SUBCUTANEOUS
  Filled 2020-11-02: qty 15

## 2020-11-02 MED ORDER — BORTEZOMIB CHEMO SQ INJECTION 3.5 MG (2.5MG/ML)
1.3000 mg/m2 | Freq: Once | INTRAMUSCULAR | Status: AC
  Administered 2020-11-02: 2.5 mg via SUBCUTANEOUS
  Filled 2020-11-02: qty 1

## 2020-11-02 MED ORDER — ACETAMINOPHEN 325 MG PO TABS
650.0000 mg | ORAL_TABLET | Freq: Once | ORAL | Status: AC
  Administered 2020-11-02: 650 mg via ORAL
  Filled 2020-11-02: qty 2

## 2020-11-02 MED ORDER — DEXAMETHASONE 4 MG PO TABS
20.0000 mg | ORAL_TABLET | Freq: Once | ORAL | Status: AC
  Administered 2020-11-02: 20 mg via ORAL
  Filled 2020-11-02: qty 5

## 2020-11-02 NOTE — Patient Instructions (Signed)
[facility]  Discharge Instructions: Thank you for choosing [facility] to provide your oncology and hematology care.   If you have a lab appointment with the [facility], please go directly to the [facility] and check in at the registration area.   Wear comfortable clothing and clothing appropriate for easy access to any Portacath or PICC line.   We strive to give you quality time with your provider. You may need to reschedule your appointment if you arrive late (15 or more minutes).  Arriving late affects you and other patients whose appointments are after yours.  Also, if you miss three or more appointments without notifying the office, you may be dismissed from the clinic at the provider's discretion.      For prescription refill requests, have your pharmacy contact our office and allow 72 hours for refills to be completed.    Today you received the following chemotherapy and/or immunotherapy agents Veldade and Darzalex fast-pro       To help prevent nausea and vomiting after your treatment, we encourage you to take your nausea medication as directed.  BELOW ARE SYMPTOMS THAT SHOULD BE REPORTED IMMEDIATELY: . *FEVER GREATER THAN 100.4 F (38 C) OR HIGHER . *CHILLS OR SWEATING . *NAUSEA AND VOMITING THAT IS NOT CONTROLLED WITH YOUR NAUSEA MEDICATION . *UNUSUAL SHORTNESS OF BREATH . *UNUSUAL BRUISING OR BLEEDING . *URINARY PROBLEMS (pain or burning when urinating, or frequent urination) . *BOWEL PROBLEMS (unusual diarrhea, constipation, pain near the anus) . TENDERNESS IN MOUTH AND THROAT WITH OR WITHOUT PRESENCE OF ULCERS (sore throat, sores in mouth, or a toothache) . UNUSUAL RASH, SWELLING OR PAIN  . UNUSUAL VAGINAL DISCHARGE OR ITCHING   Items with * indicate a potential emergency and should be followed up as soon as possible or go to the Emergency Department if any problems should occur.  Please show the CHEMOTHERAPY ALERT CARD or  IMMUNOTHERAPY ALERT CARD at check-in to the Emergency Department and triage nurse.  Should you have questions after your visit or need to cancel or reschedule your appointment, please contact [facility]  Dept: [phone]  and follow the prompts.  Office hours are 8:00 a.m. to 4:30 p.m. Monday - Friday. Please note that voicemails left after 4:00 p.m. may not be returned until the following business day.  We are closed weekends and major holidays. You have access to a nurse at all times for urgent questions. Please call the main number to the clinic Dept: [phone] and follow the prompts.   For any non-urgent questions, you may also contact your provider using MyChart. We now offer e-Visits for anyone 70 and older to request care online for non-urgent symptoms. For details visit [email or web address].   Also download the MyChart app! Go to the app store, search "MyChart", open the app, select [facility], and log in with your MyChart username and password.  Due to Covid, a mask is required upon entering the hospital/clinic. If you do not have a mask, one will be given to you upon arrival. For doctor visits, patients may have 1 support person aged 71 or older with them. For treatment visits, patients cannot have anyone with them due to current Covid guidelines and our immunocompromised population.   Daratumumab injection What is this medicine? DARATUMUMAB (dar a toom ue mab) is a monoclonal antibody. It is used to treat multiple myeloma. This medicine may be used for other purposes; ask your health care  provider or pharmacist if you have questions. COMMON BRAND NAME(S): DARZALEX What should I tell my health care provider before I take this medicine? They need to know if you have any of these conditions:  hereditary fructose intolerance  infection (especially a virus infection such as chickenpox, herpes, or hepatitis B virus)  lung or breathing disease (asthma,  COPD)  an unusual or allergic reaction to daratumumab, sorbitol, other medicines, foods, dyes, or preservatives  pregnant or trying to get pregnant  breast-feeding How should I use this medicine? This medicine is for infusion into a vein. It is given by a health care professional in a hospital or clinic setting. Talk to your pediatrician regarding the use of this medicine in children. Special care may be needed. Overdosage: If you think you have taken too much of this medicine contact a poison control center or emergency room at once. NOTE: This medicine is only for you. Do not share this medicine with others. What if I miss a dose? Keep appointments for follow-up doses as directed. It is important not to miss your dose. Call your doctor or health care professional if you are unable to keep an appointment. What may interact with this medicine? Interactions have not been studied. This list may not describe all possible interactions. Give your health care provider a list of all the medicines, herbs, non-prescription drugs, or dietary supplements you use. Also tell them if you smoke, drink alcohol, or use illegal drugs. Some items may interact with your medicine. What should I watch for while using this medicine? Your condition will be monitored carefully while you are receiving this medicine. This medicine can cause serious allergic reactions. To reduce your risk, your health care provider may give you other medicine to take before receiving this one. Be sure to follow the directions from your health care provider. This medicine can affect the results of blood tests to match your blood type. These changes can last for up to 6 months after the final dose. Your healthcare provider will do blood tests to match your blood type before you start treatment. Tell all of your healthcare providers that you are being treated with this medicine before receiving a blood transfusion. This medicine can affect  the results of some tests used to determine treatment response; extra tests may be needed to evaluate response. Do not become pregnant while taking this medicine or for 3 months after stopping it. Women should inform their health care provider if they wish to become pregnant or think they might be pregnant. There is a potential for serious side effects to an unborn child. Talk to your health care provider for more information. Do not breast-feed an infant while taking this medicine. What side effects may I notice from receiving this medicine? Side effects that you should report to your doctor or health care professional as soon as possible:  allergic reactions (skin rash; itching or hives; swelling of the face, lips, or tongue)  infection (fever, chills, cough, sore throat, pain or difficulty passing urine)  infusion reaction (dizziness, fast heartbeat, feeling faint or lightheaded, falls, headache, increase in blood pressure, nausea, vomiting, or wheezing or trouble breathing with loud or whistling sounds)  unusual bleeding or bruising Side effects that usually do not require medical attention (report to your doctor or health care professional if they continue or are bothersome):  constipation  diarrhea  pain, tingling, numbness in the hands or feet  swelling of the ankles, feet, hands  tiredness This list  may not describe all possible side effects. Call your doctor for medical advice about side effects. You may report side effects to FDA at 1-800-FDA-1088. Where should I keep my medicine? This drug is given in a hospital or clinic and will not be stored at home. NOTE: This sheet is a summary. It may not cover all possible information. If you have questions about this medicine, talk to your doctor, pharmacist, or health care provider.  2021 Elsevier/Gold Standard (2020-06-11 13:28:52)  Bortezomib injection What is this medicine? BORTEZOMIB (bor TEZ oh mib) targets proteins in cancer  cells and stops the cancer cells from growing. It treats multiple myeloma and mantle cell lymphoma. This medicine may be used for other purposes; ask your health care provider or pharmacist if you have questions. COMMON BRAND NAME(S): Velcade What should I tell my health care provider before I take this medicine? They need to know if you have any of these conditions:  dehydration  diabetes (high blood sugar)  heart disease  liver disease  tingling of the fingers or toes or other nerve disorder  an unusual or allergic reaction to bortezomib, mannitol, boron, other medicines, foods, dyes, or preservatives  pregnant or trying to get pregnant  breast-feeding How should I use this medicine? This medicine is injected into a vein or under the skin. It is given by a health care provider in a hospital or clinic setting. Talk to your health care provider about the use of this medicine in children. Special care may be needed. Overdosage: If you think you have taken too much of this medicine contact a poison control center or emergency room at once. NOTE: This medicine is only for you. Do not share this medicine with others. What if I miss a dose? Keep appointments for follow-up doses. It is important not to miss your dose. Call your health care provider if you are unable to keep an appointment. What may interact with this medicine? This medicine may interact with the following medications:  ketoconazole  rifampin This list may not describe all possible interactions. Give your health care provider a list of all the medicines, herbs, non-prescription drugs, or dietary supplements you use. Also tell them if you smoke, drink alcohol, or use illegal drugs. Some items may interact with your medicine. What should I watch for while using this medicine? Your condition will be monitored carefully while you are receiving this medicine. You may need blood work done while you are taking this  medicine. You may get drowsy or dizzy. Do not drive, use machinery, or do anything that needs mental alertness until you know how this medicine affects you. Do not stand up or sit up quickly, especially if you are an older patient. This reduces the risk of dizzy or fainting spells This medicine may increase your risk of getting an infection. Call your health care provider for advice if you get a fever, chills, sore throat, or other symptoms of a cold or flu. Do not treat yourself. Try to avoid being around people who are sick. Check with your health care provider if you have severe diarrhea, nausea, and vomiting, or if you sweat a lot. The loss of too much body fluid may make it dangerous for you to take this medicine. Do not become pregnant while taking this medicine or for 7 months after stopping it. Women should inform their health care provider if they wish to become pregnant or think they might be pregnant. Men should not father a child  while taking this medicine and for 4 months after stopping it. There is a potential for serious harm to an unborn child. Talk to your health care provider for more information. Do not breast-feed an infant while taking this medicine or for 2 months after stopping it. This medicine may make it more difficult to get pregnant or father a child. Talk to your health care provider if you are concerned about your fertility. What side effects may I notice from receiving this medicine? Side effects that you should report to your doctor or health care professional as soon as possible:  allergic reactions (skin rash; itching or hives; swelling of the face, lips, or tongue)  bleeding (bloody or black, tarry stools; red or dark brown urine; spitting up blood or brown material that looks like coffee grounds; red spots on the skin; unusual bruising or bleeding from the eye, gums, or nose)  blurred vision or changes in vision  confusion  constipation  headache  heart  failure (trouble breathing; fast, irregular heartbeat; sudden weight gain; swelling of the ankles, feet, hands)  infection (fever, chills, cough, sore throat, pain or trouble passing urine)  lack or loss of appetite  liver injury (dark yellow or brown urine; general ill feeling or flu-like symptoms; loss of appetite, right upper belly pain; yellowing of the eyes or skin)  low blood pressure (dizziness; feeling faint or lightheaded, falls; unusually weak or tired)  muscle cramps  pain, redness, or irritation at site where injected  pain, tingling, numbness in the hands or feet  seizures  trouble breathing  unusual bruising or bleeding Side effects that usually do not require medical attention (report to your doctor or health care professional if they continue or are bothersome):  diarrhea  nausea  stomach pain  trouble sleeping  vomiting This list may not describe all possible side effects. Call your doctor for medical advice about side effects. You may report side effects to FDA at 1-800-FDA-1088. Where should I keep my medicine? This medicine is given in a hospital or clinic. It will not be stored at home. NOTE: This sheet is a summary. It may not cover all possible information. If you have questions about this medicine, talk to your doctor, pharmacist, or health care provider.  2021 Elsevier/Gold Standard (2020-06-11 13:22:53)

## 2020-11-03 ENCOUNTER — Encounter: Payer: Self-pay | Admitting: Nurse Practitioner

## 2020-11-04 ENCOUNTER — Other Ambulatory Visit: Payer: Self-pay | Admitting: *Deleted

## 2020-11-04 MED ORDER — METHOCARBAMOL 500 MG PO TABS: ORAL_TABLET | ORAL | 1 refills | Status: DC

## 2020-11-04 NOTE — Telephone Encounter (Signed)
Mychart request from patient for refill on methocarbamol. Script called in.

## 2020-11-05 ENCOUNTER — Other Ambulatory Visit: Payer: Self-pay

## 2020-11-05 ENCOUNTER — Ambulatory Visit: Payer: 59

## 2020-11-05 ENCOUNTER — Other Ambulatory Visit: Payer: Self-pay | Admitting: *Deleted

## 2020-11-05 DIAGNOSIS — C9000 Multiple myeloma not having achieved remission: Secondary | ICD-10-CM

## 2020-11-08 ENCOUNTER — Other Ambulatory Visit: Payer: Self-pay | Admitting: Oncology

## 2020-11-09 ENCOUNTER — Other Ambulatory Visit: Payer: Self-pay

## 2020-11-09 ENCOUNTER — Other Ambulatory Visit: Payer: Self-pay | Admitting: Nurse Practitioner

## 2020-11-09 ENCOUNTER — Telehealth (HOSPITAL_BASED_OUTPATIENT_CLINIC_OR_DEPARTMENT_OTHER): Payer: Self-pay | Admitting: Physical Therapy

## 2020-11-09 ENCOUNTER — Other Ambulatory Visit: Payer: Self-pay | Admitting: Oncology

## 2020-11-09 ENCOUNTER — Other Ambulatory Visit: Payer: Self-pay | Admitting: *Deleted

## 2020-11-09 ENCOUNTER — Inpatient Hospital Stay: Payer: 59

## 2020-11-09 VITALS — BP 119/77 | HR 51 | Temp 98.6°F | Resp 18 | Ht 65.0 in | Wt 163.5 lb

## 2020-11-09 DIAGNOSIS — C9000 Multiple myeloma not having achieved remission: Secondary | ICD-10-CM

## 2020-11-09 LAB — CBC WITH DIFFERENTIAL (CANCER CENTER ONLY)
Abs Immature Granulocytes: 0.01 10*3/uL (ref 0.00–0.07)
Basophils Absolute: 0 10*3/uL (ref 0.0–0.1)
Basophils Relative: 1 %
Eosinophils Absolute: 0.1 10*3/uL (ref 0.0–0.5)
Eosinophils Relative: 1 %
HCT: 41.6 % (ref 36.0–46.0)
Hemoglobin: 13.7 g/dL (ref 12.0–15.0)
Immature Granulocytes: 0 %
Lymphocytes Relative: 13 %
Lymphs Abs: 0.8 10*3/uL (ref 0.7–4.0)
MCH: 31.1 pg (ref 26.0–34.0)
MCHC: 32.9 g/dL (ref 30.0–36.0)
MCV: 94.5 fL (ref 80.0–100.0)
Monocytes Absolute: 0.8 10*3/uL (ref 0.1–1.0)
Monocytes Relative: 13 %
Neutro Abs: 4.3 10*3/uL (ref 1.7–7.7)
Neutrophils Relative %: 72 %
Platelet Count: 229 10*3/uL (ref 150–400)
RBC: 4.4 MIL/uL (ref 3.87–5.11)
RDW: 13.1 % (ref 11.5–15.5)
WBC Count: 5.9 10*3/uL (ref 4.0–10.5)
nRBC: 0 % (ref 0.0–0.2)

## 2020-11-09 LAB — CMP (CANCER CENTER ONLY)
ALT: 10 U/L (ref 0–44)
AST: 8 U/L — ABNORMAL LOW (ref 15–41)
Albumin: 4.2 g/dL (ref 3.5–5.0)
Alkaline Phosphatase: 76 U/L (ref 38–126)
Anion gap: 10 (ref 5–15)
BUN: 14 mg/dL (ref 6–20)
CO2: 26 mmol/L (ref 22–32)
Calcium: 8.4 mg/dL — ABNORMAL LOW (ref 8.9–10.3)
Chloride: 103 mmol/L (ref 98–111)
Creatinine: 0.6 mg/dL (ref 0.44–1.00)
GFR, Estimated: >60 mL/min (ref >60)
Glucose, Bld: 95 mg/dL (ref 70–99)
Potassium: 3.8 mmol/L (ref 3.5–5.1)
Sodium: 139 mmol/L (ref 135–145)
Total Bilirubin: 0.4 mg/dL (ref 0.3–1.2)
Total Protein: 6.4 g/dL — ABNORMAL LOW (ref 6.5–8.1)

## 2020-11-09 MED ORDER — DIPHENHYDRAMINE HCL 25 MG PO CAPS
50.0000 mg | ORAL_CAPSULE | Freq: Once | ORAL | Status: AC
Start: 2020-11-09 — End: 2020-11-09
  Administered 2020-11-09: 50 mg via ORAL
  Filled 2020-11-09: qty 2

## 2020-11-09 MED ORDER — BORTEZOMIB CHEMO SQ INJECTION 3.5 MG (2.5MG/ML)
1.3000 mg/m2 | Freq: Once | INTRAMUSCULAR | Status: AC
Start: 2020-11-09 — End: 2020-11-09
  Administered 2020-11-09: 2.5 mg via SUBCUTANEOUS
  Filled 2020-11-09: qty 1

## 2020-11-09 MED ORDER — OXYCODONE HCL 5 MG PO TABS: 5.0000 mg | ORAL_TABLET | ORAL | 0 refills | Status: DC | PRN

## 2020-11-09 MED ORDER — ACETAMINOPHEN 325 MG PO TABS
650.0000 mg | ORAL_TABLET | Freq: Once | ORAL | Status: AC
  Administered 2020-11-09: 650 mg via ORAL
  Filled 2020-11-09: qty 2

## 2020-11-09 MED ORDER — LENALIDOMIDE 25 MG PO CAPS: 25.0000 mg | ORAL_CAPSULE | Freq: Every day | ORAL | 0 refills | Status: DC

## 2020-11-09 MED ORDER — DEXAMETHASONE 4 MG PO TABS
20.0000 mg | ORAL_TABLET | Freq: Once | ORAL | Status: AC
  Administered 2020-11-09: 20 mg via ORAL
  Filled 2020-11-09: qty 5

## 2020-11-09 MED ORDER — DARATUMUMAB-HYALURONIDASE-FIHJ 1800-30000 MG-UT/15ML ~~LOC~~ SOLN
1800.0000 mg | Freq: Once | SUBCUTANEOUS | Status: AC
  Administered 2020-11-09: 1800 mg via SUBCUTANEOUS
  Filled 2020-11-09: qty 15

## 2020-11-09 NOTE — Patient Instructions (Signed)
[facility]    Discharge Instructions:  Thank you for choosing [facility] to provide your oncology and hematology care.   If you have a lab appointment with the [facility], please go directly to the [facility] and check in at the registration area.   Wear comfortable clothing and clothing appropriate for easy access to any Portacath or PICC line.   We strive to give you quality time with your provider. You may need to reschedule your appointment if you arrive late (15 or more minutes).  Arriving late affects you and other patients whose appointments are after yours.  Also, if you miss three or more appointments without notifying the office, you may be dismissed from the clinic at the provider's discretion.      For prescription refill requests, have your pharmacy contact our office and allow 72 hours for refills to be completed.    Today you received the following chemotherapy and/or immunotherapy agents Bortezomib (VELCADE) & Daratumumab-hyaluronidase-fihj (DARZALEX FASPRO).   To help prevent nausea and vomiting after your treatment, we encourage you to take your nausea medication as directed.  BELOW ARE SYMPTOMS THAT SHOULD BE REPORTED IMMEDIATELY: . *FEVER GREATER THAN 100.4 F (38 C) OR HIGHER . *CHILLS OR SWEATING . *NAUSEA AND VOMITING THAT IS NOT CONTROLLED WITH YOUR NAUSEA MEDICATION . *UNUSUAL SHORTNESS OF BREATH . *UNUSUAL BRUISING OR BLEEDING . *URINARY PROBLEMS (pain or burning when urinating, or frequent urination) . *BOWEL PROBLEMS (unusual diarrhea, constipation, pain near the anus) . TENDERNESS IN MOUTH AND THROAT WITH OR WITHOUT PRESENCE OF ULCERS (sore throat, sores in mouth, or a toothache) . UNUSUAL RASH, SWELLING OR PAIN  . UNUSUAL VAGINAL DISCHARGE OR ITCHING   Items with * indicate a potential emergency and should be followed up as soon as possible or go to the Emergency Department if any problems should  occur.  Please show the CHEMOTHERAPY ALERT CARD or IMMUNOTHERAPY ALERT CARD at check-in to the Emergency Department and triage nurse.  Should you have questions after your visit or need to cancel or reschedule your appointment, please contact [facility]  Dept: [phone]  and follow the prompts.  Office hours are 8:00 a.m. to 4:30 p.m. Monday - Friday. Please note that voicemails left after 4:00 p.m. may not be returned until the following business day.  We are closed weekends and major holidays. You have access to a nurse at all times for urgent questions. Please call the main number to the clinic Dept: [phone] and follow the prompts.   For any non-urgent questions, you may also contact your provider using MyChart. We now offer e-Visits for anyone 29 and older to request care online for non-urgent symptoms. For details visit [email or web address].   Also download the MyChart app! Go to the app store, search "MyChart", open the app, select [facility], and log in with your MyChart username and password.  Due to Covid, a mask is required upon entering the hospital/clinic. If you do not have a mask, one will be given to you upon arrival. For doctor visits, patients may have 1 support person aged 59 or older with them. For treatment visits, patients cannot have anyone with them due to current Covid guidelines and our immunocompromised population.   Bortezomib injection What is this medicine? BORTEZOMIB (bor TEZ oh mib) targets proteins in cancer cells and stops the cancer cells from growing. It treats multiple myeloma and mantle cell lymphoma. This medicine may  be used for other purposes; ask your health care provider or pharmacist if you have questions. COMMON BRAND NAME(S): Velcade What should I tell my health care provider before I take this medicine? They need to know if you have any of these conditions:  dehydration  diabetes (high blood sugar)  heart  disease  liver disease  tingling of the fingers or toes or other nerve disorder  an unusual or allergic reaction to bortezomib, mannitol, boron, other medicines, foods, dyes, or preservatives  pregnant or trying to get pregnant  breast-feeding How should I use this medicine? This medicine is injected into a vein or under the skin. It is given by a health care provider in a hospital or clinic setting. Talk to your health care provider about the use of this medicine in children. Special care may be needed. Overdosage: If you think you have taken too much of this medicine contact a poison control center or emergency room at once. NOTE: This medicine is only for you. Do not share this medicine with others. What if I miss a dose? Keep appointments for follow-up doses. It is important not to miss your dose. Call your health care provider if you are unable to keep an appointment. What may interact with this medicine? This medicine may interact with the following medications:  ketoconazole  rifampin This list may not describe all possible interactions. Give your health care provider a list of all the medicines, herbs, non-prescription drugs, or dietary supplements you use. Also tell them if you smoke, drink alcohol, or use illegal drugs. Some items may interact with your medicine. What should I watch for while using this medicine? Your condition will be monitored carefully while you are receiving this medicine. You may need blood work done while you are taking this medicine. You may get drowsy or dizzy. Do not drive, use machinery, or do anything that needs mental alertness until you know how this medicine affects you. Do not stand up or sit up quickly, especially if you are an older patient. This reduces the risk of dizzy or fainting spells This medicine may increase your risk of getting an infection. Call your health care provider for advice if you get a fever, chills, sore throat, or other  symptoms of a cold or flu. Do not treat yourself. Try to avoid being around people who are sick. Check with your health care provider if you have severe diarrhea, nausea, and vomiting, or if you sweat a lot. The loss of too much body fluid may make it dangerous for you to take this medicine. Do not become pregnant while taking this medicine or for 7 months after stopping it. Women should inform their health care provider if they wish to become pregnant or think they might be pregnant. Men should not father a child while taking this medicine and for 4 months after stopping it. There is a potential for serious harm to an unborn child. Talk to your health care provider for more information. Do not breast-feed an infant while taking this medicine or for 2 months after stopping it. This medicine may make it more difficult to get pregnant or father a child. Talk to your health care provider if you are concerned about your fertility. What side effects may I notice from receiving this medicine? Side effects that you should report to your doctor or health care professional as soon as possible:  allergic reactions (skin rash; itching or hives; swelling of the face, lips, or tongue)  bleeding (bloody or black, tarry stools; red or dark brown urine; spitting up blood or brown material that looks like coffee grounds; red spots on the skin; unusual bruising or bleeding from the eye, gums, or nose)  blurred vision or changes in vision  confusion  constipation  headache  heart failure (trouble breathing; fast, irregular heartbeat; sudden weight gain; swelling of the ankles, feet, hands)  infection (fever, chills, cough, sore throat, pain or trouble passing urine)  lack or loss of appetite  liver injury (dark yellow or brown urine; general ill feeling or flu-like symptoms; loss of appetite, right upper belly pain; yellowing of the eyes or skin)  low blood pressure (dizziness; feeling faint or lightheaded,  falls; unusually weak or tired)  muscle cramps  pain, redness, or irritation at site where injected  pain, tingling, numbness in the hands or feet  seizures  trouble breathing  unusual bruising or bleeding Side effects that usually do not require medical attention (report to your doctor or health care professional if they continue or are bothersome):  diarrhea  nausea  stomach pain  trouble sleeping  vomiting This list may not describe all possible side effects. Call your doctor for medical advice about side effects. You may report side effects to FDA at 1-800-FDA-1088. Where should I keep my medicine? This medicine is given in a hospital or clinic. It will not be stored at home. NOTE: This sheet is a summary. It may not cover all possible information. If you have questions about this medicine, talk to your doctor, pharmacist, or health care provider.  2021 Elsevier/Gold Standard (2020-06-11 13:22:53)  Daratumumab injection What is this medicine? DARATUMUMAB (dar a toom ue mab) is a monoclonal antibody. It is used to treat multiple myeloma. This medicine may be used for other purposes; ask your health care provider or pharmacist if you have questions. COMMON BRAND NAME(S): DARZALEX What should I tell my health care provider before I take this medicine? They need to know if you have any of these conditions:  hereditary fructose intolerance  infection (especially a virus infection such as chickenpox, herpes, or hepatitis B virus)  lung or breathing disease (asthma, COPD)  an unusual or allergic reaction to daratumumab, sorbitol, other medicines, foods, dyes, or preservatives  pregnant or trying to get pregnant  breast-feeding How should I use this medicine? This medicine is for infusion into a vein. It is given by a health care professional in a hospital or clinic setting. Talk to your pediatrician regarding the use of this medicine in children. Special care may be  needed. Overdosage: If you think you have taken too much of this medicine contact a poison control center or emergency room at once. NOTE: This medicine is only for you. Do not share this medicine with others. What if I miss a dose? Keep appointments for follow-up doses as directed. It is important not to miss your dose. Call your doctor or health care professional if you are unable to keep an appointment. What may interact with this medicine? Interactions have not been studied. This list may not describe all possible interactions. Give your health care provider a list of all the medicines, herbs, non-prescription drugs, or dietary supplements you use. Also tell them if you smoke, drink alcohol, or use illegal drugs. Some items may interact with your medicine. What should I watch for while using this medicine? Your condition will be monitored carefully while you are receiving this medicine. This medicine can cause serious allergic reactions.  To reduce your risk, your health care provider may give you other medicine to take before receiving this one. Be sure to follow the directions from your health care provider. This medicine can affect the results of blood tests to match your blood type. These changes can last for up to 6 months after the final dose. Your healthcare provider will do blood tests to match your blood type before you start treatment. Tell all of your healthcare providers that you are being treated with this medicine before receiving a blood transfusion. This medicine can affect the results of some tests used to determine treatment response; extra tests may be needed to evaluate response. Do not become pregnant while taking this medicine or for 3 months after stopping it. Women should inform their health care provider if they wish to become pregnant or think they might be pregnant. There is a potential for serious side effects to an unborn child. Talk to your health care provider for more  information. Do not breast-feed an infant while taking this medicine. What side effects may I notice from receiving this medicine? Side effects that you should report to your doctor or health care professional as soon as possible:  allergic reactions (skin rash; itching or hives; swelling of the face, lips, or tongue)  infection (fever, chills, cough, sore throat, pain or difficulty passing urine)  infusion reaction (dizziness, fast heartbeat, feeling faint or lightheaded, falls, headache, increase in blood pressure, nausea, vomiting, or wheezing or trouble breathing with loud or whistling sounds)  unusual bleeding or bruising Side effects that usually do not require medical attention (report to your doctor or health care professional if they continue or are bothersome):  constipation  diarrhea  pain, tingling, numbness in the hands or feet  swelling of the ankles, feet, hands  tiredness This list may not describe all possible side effects. Call your doctor for medical advice about side effects. You may report side effects to FDA at 1-800-FDA-1088. Where should I keep my medicine? This drug is given in a hospital or clinic and will not be stored at home. NOTE: This sheet is a summary. It may not cover all possible information. If you have questions about this medicine, talk to your doctor, pharmacist, or health care provider.  2021 Elsevier/Gold Standard (2020-06-11 13:28:52)

## 2020-11-09 NOTE — Progress Notes (Signed)
Refilled Revlimid for next cycle due 11/16/20

## 2020-11-09 NOTE — Telephone Encounter (Signed)
LVM regarding questions about PT referral. Requested to call back at her convenience.  Katherine Beard C. Ruffin Lada PT, DPT 11/09/20 12:42 PM

## 2020-11-12 ENCOUNTER — Other Ambulatory Visit: Payer: Self-pay | Admitting: *Deleted

## 2020-11-12 DIAGNOSIS — C9000 Multiple myeloma not having achieved remission: Secondary | ICD-10-CM

## 2020-11-15 ENCOUNTER — Other Ambulatory Visit: Payer: Self-pay | Admitting: Oncology

## 2020-11-16 ENCOUNTER — Other Ambulatory Visit: Payer: Self-pay

## 2020-11-16 ENCOUNTER — Inpatient Hospital Stay: Payer: 59

## 2020-11-16 ENCOUNTER — Telehealth: Payer: Self-pay

## 2020-11-16 ENCOUNTER — Inpatient Hospital Stay (HOSPITAL_BASED_OUTPATIENT_CLINIC_OR_DEPARTMENT_OTHER): Payer: 59 | Admitting: Oncology

## 2020-11-16 VITALS — BP 133/77 | HR 55 | Temp 98.1°F | Resp 18 | Ht 65.0 in | Wt 166.4 lb

## 2020-11-16 DIAGNOSIS — C9000 Multiple myeloma not having achieved remission: Secondary | ICD-10-CM

## 2020-11-16 LAB — CBC WITH DIFFERENTIAL (CANCER CENTER ONLY)
Abs Immature Granulocytes: 0.01 10*3/uL (ref 0.00–0.07)
Basophils Absolute: 0.1 10*3/uL (ref 0.0–0.1)
Basophils Relative: 1 %
Eosinophils Absolute: 0 10*3/uL (ref 0.0–0.5)
Eosinophils Relative: 1 %
HCT: 43.5 % (ref 36.0–46.0)
Hemoglobin: 14.4 g/dL (ref 12.0–15.0)
Immature Granulocytes: 0 %
Lymphocytes Relative: 17 %
Lymphs Abs: 0.9 10*3/uL (ref 0.7–4.0)
MCH: 31.4 pg (ref 26.0–34.0)
MCHC: 33.1 g/dL (ref 30.0–36.0)
MCV: 95 fL (ref 80.0–100.0)
Monocytes Absolute: 0.6 10*3/uL (ref 0.1–1.0)
Monocytes Relative: 12 %
Neutro Abs: 3.6 10*3/uL (ref 1.7–7.7)
Neutrophils Relative %: 69 %
Platelet Count: 218 10*3/uL (ref 150–400)
RBC: 4.58 MIL/uL (ref 3.87–5.11)
RDW: 13.3 % (ref 11.5–15.5)
WBC Count: 5.1 10*3/uL (ref 4.0–10.5)
nRBC: 0 % (ref 0.0–0.2)

## 2020-11-16 LAB — CMP (CANCER CENTER ONLY)
ALT: 9 U/L (ref 0–44)
AST: 10 U/L — ABNORMAL LOW (ref 15–41)
Albumin: 4.3 g/dL (ref 3.5–5.0)
Alkaline Phosphatase: 66 U/L (ref 38–126)
Anion gap: 9 (ref 5–15)
BUN: 19 mg/dL (ref 6–20)
CO2: 26 mmol/L (ref 22–32)
Calcium: 9 mg/dL (ref 8.9–10.3)
Chloride: 103 mmol/L (ref 98–111)
Creatinine: 0.63 mg/dL (ref 0.44–1.00)
GFR, Estimated: >60 mL/min (ref >60)
Glucose, Bld: 82 mg/dL (ref 70–99)
Potassium: 3.8 mmol/L (ref 3.5–5.1)
Sodium: 138 mmol/L (ref 135–145)
Total Bilirubin: 0.4 mg/dL (ref 0.3–1.2)
Total Protein: 6.2 g/dL — ABNORMAL LOW (ref 6.5–8.1)

## 2020-11-16 MED ORDER — ACETAMINOPHEN 325 MG PO TABS
650.0000 mg | ORAL_TABLET | Freq: Once | ORAL | Status: AC
  Administered 2020-11-16: 650 mg via ORAL
  Filled 2020-11-16: qty 2

## 2020-11-16 MED ORDER — DEXAMETHASONE 4 MG PO TABS
20.0000 mg | ORAL_TABLET | Freq: Once | ORAL | Status: AC
  Administered 2020-11-16: 20 mg via ORAL
  Filled 2020-11-16: qty 5

## 2020-11-16 MED ORDER — ZOLEDRONIC ACID 4 MG/100ML IV SOLN
4.0000 mg | Freq: Once | INTRAVENOUS | Status: AC
  Administered 2020-11-16: 4 mg via INTRAVENOUS
  Filled 2020-11-16: qty 100

## 2020-11-16 MED ORDER — BORTEZOMIB CHEMO SQ INJECTION 3.5 MG (2.5MG/ML)
1.0000 mg/m2 | Freq: Once | INTRAMUSCULAR | Status: AC
  Administered 2020-11-16: 1.75 mg via SUBCUTANEOUS
  Filled 2020-11-16: qty 0.7

## 2020-11-16 MED ORDER — SODIUM CHLORIDE 0.9 % IV SOLN
Freq: Once | INTRAVENOUS | Status: AC
  Filled 2020-11-16: qty 250

## 2020-11-16 MED ORDER — DIPHENHYDRAMINE HCL 25 MG PO CAPS
50.0000 mg | ORAL_CAPSULE | Freq: Once | ORAL | Status: AC
  Administered 2020-11-16: 50 mg via ORAL
  Filled 2020-11-16: qty 2

## 2020-11-16 MED ORDER — DARATUMUMAB-HYALURONIDASE-FIHJ 1800-30000 MG-UT/15ML ~~LOC~~ SOLN
1800.0000 mg | Freq: Once | SUBCUTANEOUS | Status: AC
  Administered 2020-11-16: 1800 mg via SUBCUTANEOUS
  Filled 2020-11-16: qty 15

## 2020-11-16 NOTE — Telephone Encounter (Signed)
Referral faxed for physical therapy to [facility] [phone] attention to [phone] as well as message left to return call if not received

## 2020-11-16 NOTE — Patient Instructions (Signed)
[facility]  Discharge Instructions: Thank you for choosing [facility] to provide your oncology and hematology care.   If you have a lab appointment with the [facility], please go directly to the [facility] and check in at the registration area.   Wear comfortable clothing and clothing appropriate for easy access to any Portacath or PICC line.   We strive to give you quality time with your provider. You may need to reschedule your appointment if you arrive late (15 or more minutes).  Arriving late affects you and other patients whose appointments are after yours.  Also, if you miss three or more appointments without notifying the office, you may be dismissed from the clinic at the provider's discretion.      For prescription refill requests, have your pharmacy contact our office and allow 72 hours for refills to be completed.    Today you received the following chemotherapy and/or immunotherapy agents: darzalex faspro, velcade      To help prevent nausea and vomiting after your treatment, we encourage you to take your nausea medication as directed.  BELOW ARE SYMPTOMS THAT SHOULD BE REPORTED IMMEDIATELY: . *FEVER GREATER THAN 100.4 F (38 C) OR HIGHER . *CHILLS OR SWEATING . *NAUSEA AND VOMITING THAT IS NOT CONTROLLED WITH YOUR NAUSEA MEDICATION . *UNUSUAL SHORTNESS OF BREATH . *UNUSUAL BRUISING OR BLEEDING . *URINARY PROBLEMS (pain or burning when urinating, or frequent urination) . *BOWEL PROBLEMS (unusual diarrhea, constipation, pain near the anus) . TENDERNESS IN MOUTH AND THROAT WITH OR WITHOUT PRESENCE OF ULCERS (sore throat, sores in mouth, or a toothache) . UNUSUAL RASH, SWELLING OR PAIN  . UNUSUAL VAGINAL DISCHARGE OR ITCHING   Items with * indicate a potential emergency and should be followed up as soon as possible or go to the Emergency Department if any problems should occur.  Please show the CHEMOTHERAPY ALERT CARD or  IMMUNOTHERAPY ALERT CARD at check-in to the Emergency Department and triage nurse.  Should you have questions after your visit or need to cancel or reschedule your appointment, please contact [facility]  Dept: [phone]  and follow the prompts.  Office hours are 8:00 a.m. to 4:30 p.m. Monday - Friday. Please note that voicemails left after 4:00 p.m. may not be returned until the following business day.  We are closed weekends and major holidays. You have access to a nurse at all times for urgent questions. Please call the main number to the clinic Dept: [phone] and follow the prompts.   For any non-urgent questions, you may also contact your provider using MyChart. We now offer e-Visits for anyone 39 and older to request care online for non-urgent symptoms. For details visit [email or web address].   Also download the MyChart app! Go to the app store, search "MyChart", open the app, select [facility], and log in with your MyChart username and password.  Due to Covid, a mask is required upon entering the hospital/clinic. If you do not have a mask, one will be given to you upon arrival. For doctor visits, patients may have 1 support person aged 76 or older with them. For treatment visits, patients cannot have anyone with them due to current Covid guidelines and our immunocompromised population.   Daratumumab injection What is this medicine? DARATUMUMAB (dar a toom ue mab) is a monoclonal antibody. It is used to treat multiple myeloma. This medicine may be used for other purposes; ask your health care provider or  pharmacist if you have questions. COMMON BRAND NAME(S): DARZALEX What should I tell my health care provider before I take this medicine? They need to know if you have any of these conditions:  hereditary fructose intolerance  infection (especially a virus infection such as chickenpox, herpes, or hepatitis B virus)  lung or breathing disease (asthma,  COPD)  an unusual or allergic reaction to daratumumab, sorbitol, other medicines, foods, dyes, or preservatives  pregnant or trying to get pregnant  breast-feeding How should I use this medicine? This medicine is for infusion into a vein. It is given by a health care professional in a hospital or clinic setting. Talk to your pediatrician regarding the use of this medicine in children. Special care may be needed. Overdosage: If you think you have taken too much of this medicine contact a poison control center or emergency room at once. NOTE: This medicine is only for you. Do not share this medicine with others. What if I miss a dose? Keep appointments for follow-up doses as directed. It is important not to miss your dose. Call your doctor or health care professional if you are unable to keep an appointment. What may interact with this medicine? Interactions have not been studied. This list may not describe all possible interactions. Give your health care provider a list of all the medicines, herbs, non-prescription drugs, or dietary supplements you use. Also tell them if you smoke, drink alcohol, or use illegal drugs. Some items may interact with your medicine. What should I watch for while using this medicine? Your condition will be monitored carefully while you are receiving this medicine. This medicine can cause serious allergic reactions. To reduce your risk, your health care provider may give you other medicine to take before receiving this one. Be sure to follow the directions from your health care provider. This medicine can affect the results of blood tests to match your blood type. These changes can last for up to 6 months after the final dose. Your healthcare provider will do blood tests to match your blood type before you start treatment. Tell all of your healthcare providers that you are being treated with this medicine before receiving a blood transfusion. This medicine can affect  the results of some tests used to determine treatment response; extra tests may be needed to evaluate response. Do not become pregnant while taking this medicine or for 3 months after stopping it. Women should inform their health care provider if they wish to become pregnant or think they might be pregnant. There is a potential for serious side effects to an unborn child. Talk to your health care provider for more information. Do not breast-feed an infant while taking this medicine. What side effects may I notice from receiving this medicine? Side effects that you should report to your doctor or health care professional as soon as possible:  allergic reactions (skin rash; itching or hives; swelling of the face, lips, or tongue)  infection (fever, chills, cough, sore throat, pain or difficulty passing urine)  infusion reaction (dizziness, fast heartbeat, feeling faint or lightheaded, falls, headache, increase in blood pressure, nausea, vomiting, or wheezing or trouble breathing with loud or whistling sounds)  unusual bleeding or bruising Side effects that usually do not require medical attention (report to your doctor or health care professional if they continue or are bothersome):  constipation  diarrhea  pain, tingling, numbness in the hands or feet  swelling of the ankles, feet, hands  tiredness This list may not  describe all possible side effects. Call your doctor for medical advice about side effects. You may report side effects to FDA at 1-800-FDA-1088. Where should I keep my medicine? This drug is given in a hospital or clinic and will not be stored at home. NOTE: This sheet is a summary. It may not cover all possible information. If you have questions about this medicine, talk to your doctor, pharmacist, or health care provider.  2021 Elsevier/Gold Standard (2020-06-11 13:28:52)  Bortezomib injection What is this medicine? BORTEZOMIB (bor TEZ oh mib) targets proteins in cancer  cells and stops the cancer cells from growing. It treats multiple myeloma and mantle cell lymphoma. This medicine may be used for other purposes; ask your health care provider or pharmacist if you have questions. COMMON BRAND NAME(S): Velcade What should I tell my health care provider before I take this medicine? They need to know if you have any of these conditions:  dehydration  diabetes (high blood sugar)  heart disease  liver disease  tingling of the fingers or toes or other nerve disorder  an unusual or allergic reaction to bortezomib, mannitol, boron, other medicines, foods, dyes, or preservatives  pregnant or trying to get pregnant  breast-feeding How should I use this medicine? This medicine is injected into a vein or under the skin. It is given by a health care provider in a hospital or clinic setting. Talk to your health care provider about the use of this medicine in children. Special care may be needed. Overdosage: If you think you have taken too much of this medicine contact a poison control center or emergency room at once. NOTE: This medicine is only for you. Do not share this medicine with others. What if I miss a dose? Keep appointments for follow-up doses. It is important not to miss your dose. Call your health care provider if you are unable to keep an appointment. What may interact with this medicine? This medicine may interact with the following medications:  ketoconazole  rifampin This list may not describe all possible interactions. Give your health care provider a list of all the medicines, herbs, non-prescription drugs, or dietary supplements you use. Also tell them if you smoke, drink alcohol, or use illegal drugs. Some items may interact with your medicine. What should I watch for while using this medicine? Your condition will be monitored carefully while you are receiving this medicine. You may need blood work done while you are taking this  medicine. You may get drowsy or dizzy. Do not drive, use machinery, or do anything that needs mental alertness until you know how this medicine affects you. Do not stand up or sit up quickly, especially if you are an older patient. This reduces the risk of dizzy or fainting spells This medicine may increase your risk of getting an infection. Call your health care provider for advice if you get a fever, chills, sore throat, or other symptoms of a cold or flu. Do not treat yourself. Try to avoid being around people who are sick. Check with your health care provider if you have severe diarrhea, nausea, and vomiting, or if you sweat a lot. The loss of too much body fluid may make it dangerous for you to take this medicine. Do not become pregnant while taking this medicine or for 7 months after stopping it. Women should inform their health care provider if they wish to become pregnant or think they might be pregnant. Men should not father a child while taking  this medicine and for 4 months after stopping it. There is a potential for serious harm to an unborn child. Talk to your health care provider for more information. Do not breast-feed an infant while taking this medicine or for 2 months after stopping it. This medicine may make it more difficult to get pregnant or father a child. Talk to your health care provider if you are concerned about your fertility. What side effects may I notice from receiving this medicine? Side effects that you should report to your doctor or health care professional as soon as possible:  allergic reactions (skin rash; itching or hives; swelling of the face, lips, or tongue)  bleeding (bloody or black, tarry stools; red or dark brown urine; spitting up blood or brown material that looks like coffee grounds; red spots on the skin; unusual bruising or bleeding from the eye, gums, or nose)  blurred vision or changes in vision  confusion  constipation  headache  heart  failure (trouble breathing; fast, irregular heartbeat; sudden weight gain; swelling of the ankles, feet, hands)  infection (fever, chills, cough, sore throat, pain or trouble passing urine)  lack or loss of appetite  liver injury (dark yellow or brown urine; general ill feeling or flu-like symptoms; loss of appetite, right upper belly pain; yellowing of the eyes or skin)  low blood pressure (dizziness; feeling faint or lightheaded, falls; unusually weak or tired)  muscle cramps  pain, redness, or irritation at site where injected  pain, tingling, numbness in the hands or feet  seizures  trouble breathing  unusual bruising or bleeding Side effects that usually do not require medical attention (report to your doctor or health care professional if they continue or are bothersome):  diarrhea  nausea  stomach pain  trouble sleeping  vomiting This list may not describe all possible side effects. Call your doctor for medical advice about side effects. You may report side effects to FDA at 1-800-FDA-1088. Where should I keep my medicine? This medicine is given in a hospital or clinic. It will not be stored at home. NOTE: This sheet is a summary. It may not cover all possible information. If you have questions about this medicine, talk to your doctor, pharmacist, or health care provider.  2021 Elsevier/Gold Standard (2020-06-11 13:22:53)  Zoledronic Acid Injection (Hypercalcemia, Oncology) What is this medicine? ZOLEDRONIC ACID (ZOE le dron ik AS id) slows calcium loss from bones. It high calcium levels in the blood from some kinds of cancer. It may be used in other people at risk for bone loss. This medicine may be used for other purposes; ask your health care provider or pharmacist if you have questions. COMMON BRAND NAME(S): Zometa What should I tell my health care provider before I take this medicine? They need to know if you have any of these  conditions:  cancer  dehydration  dental disease  kidney disease  liver disease  low levels of calcium in the blood  lung or breathing disease (asthma)  receiving steroids like dexamethasone or prednisone  an unusual or allergic reaction to zoledronic acid, other medicines, foods, dyes, or preservatives  pregnant or trying to get pregnant  breast-feeding How should I use this medicine? This drug is injected into a vein. It is given by a health care provider in a hospital or clinic setting. Talk to your health care provider about the use of this drug in children. Special care may be needed. Overdosage: If you think you have taken too much  of this medicine contact a poison control center or emergency room at once. NOTE: This medicine is only for you. Do not share this medicine with others. What if I miss a dose? Keep appointments for follow-up doses. It is important not to miss your dose. Call your health care provider if you are unable to keep an appointment. What may interact with this medicine?  certain antibiotics given by injection  NSAIDs, medicines for pain and inflammation, like ibuprofen or naproxen  some diuretics like bumetanide, furosemide  teriparatide  thalidomide This list may not describe all possible interactions. Give your health care provider a list of all the medicines, herbs, non-prescription drugs, or dietary supplements you use. Also tell them if you smoke, drink alcohol, or use illegal drugs. Some items may interact with your medicine. What should I watch for while using this medicine? Visit your health care provider for regular checks on your progress. It may be some time before you see the benefit from this drug. Some people who take this drug have severe bone, joint, or muscle pain. This drug may also increase your risk for jaw problems or a broken thigh bone. Tell your health care provider right away if you have severe pain in your jaw, bones,  joints, or muscles. Tell you health care provider if you have any pain that does not go away or that gets worse. Tell your dentist and dental surgeon that you are taking this drug. You should not have major dental surgery while on this drug. See your dentist to have a dental exam and fix any dental problems before starting this drug. Take good care of your teeth while on this drug. Make sure you see your dentist for regular follow-up appointments. You should make sure you get enough calcium and vitamin D while you are taking this drug. Discuss the foods you eat and the vitamins you take with your health care provider. Check with your health care provider if you have severe diarrhea, nausea, and vomiting, or if you sweat a lot. The loss of too much body fluid may make it dangerous for you to take this drug. You may need blood work done while you are taking this drug. Do not become pregnant while taking this drug. Women should inform their health care provider if they wish to become pregnant or think they might be pregnant. There is potential for serious harm to an unborn child. Talk to your health care provider for more information. What side effects may I notice from receiving this medicine? Side effects that you should report to your doctor or health care provider as soon as possible:  allergic reactions (skin rash, itching or hives; swelling of the face, lips, or tongue)  bone pain  infection (fever, chills, cough, sore throat, pain or trouble passing urine)  jaw pain, especially after dental work  joint pain  kidney injury (trouble passing urine or change in the amount of urine)  low blood pressure (dizziness; feeling faint or lightheaded, falls; unusually weak or tired)  low calcium levels (fast heartbeat; muscle cramps or pain; pain, tingling, or numbness in the hands or feet; seizures)  low magnesium levels (fast, irregular heartbeat; muscle cramp or pain; muscle weakness; tremors;  seizures)  low red blood cell counts (trouble breathing; feeling faint; lightheaded, falls; unusually weak or tired)  muscle pain  redness, blistering, peeling, or loosening of the skin, including inside the mouth  severe diarrhea  swelling of the ankles, feet, hands  trouble breathing  Side effects that usually do not require medical attention (report to your doctor or health care provider if they continue or are bothersome):  anxious  constipation  coughing  depressed mood  eye irritation, itching, or pain  fever  general ill feeling or flu-like symptoms  nausea  pain, redness, or irritation at site where injected  trouble sleeping This list may not describe all possible side effects. Call your doctor for medical advice about side effects. You may report side effects to FDA at 1-800-FDA-1088. Where should I keep my medicine? This drug is given in a hospital or clinic. It will not be stored at home. NOTE: This sheet is a summary. It may not cover all possible information. If you have questions about this medicine, talk to your doctor, pharmacist, or health care provider.  2021 Elsevier/Gold Standard (2019-04-04 09:13:00)

## 2020-11-16 NOTE — Progress Notes (Signed)
[facility] OFFICE PROGRESS NOTE   Diagnosis: Multiple myeloma  INTERVAL HISTORY:   Ms. Katherine Beard returns as scheduled.  She continues treatment with VRD-daratumumab.  Pain is much improved.  Her mobility has improved.  She plans to begin a physical therapy program this week.  She has recurring styes at both eyes.  She saw [name] on 11/10/2020.  He recommends decreasing the Velcade dose.  She has been referred for a BMT evaluation.  Objective:  Vital signs in last 24 hours:  Blood pressure 133/77, pulse (!) 55, temperature 98.1 F (36.7 C), temperature source Oral, resp. rate 18, height _0  (1.651 m), weight 166 lb 6.4 oz (75.5 kg), last menstrual period 03/06/2011, SpO2 100 %.    HEENT: No thrush or ulcers, styes at both lower eyelid Resp: Lungs clear bilaterally Cardio: Regular rate and rhythm GI: No hepatosplenomegaly, nontender Vascular: No leg edema     Lab Results:  Lab Results  Component Value Date   WBC 5.1 11/16/2020   HGB 14.4 11/16/2020   HCT 43.5 11/16/2020   MCV 95.0 11/16/2020   PLT 218 11/16/2020   NEUTROABS 3.6 11/16/2020    CMP  Lab Results  Component Value Date   NA 138 11/16/2020   K 3.8 11/16/2020   CL 103 11/16/2020   CO2 26 11/16/2020   GLUCOSE 82 11/16/2020   BUN 19 11/16/2020   CREATININE 0.63 11/16/2020   CALCIUM 9.0 11/16/2020   PROT 6.2 (L) 11/16/2020   ALBUMIN 4.3 11/16/2020   AST 10 (L) 11/16/2020   ALT 9 11/16/2020   ALKPHOS 66 11/16/2020   BILITOT 0.4 11/16/2020   GFRNONAA >60 11/16/2020   GFRAA 78 12/11/2006     Medications: I have reviewed the patient's current medications.   Assessment/Plan: 1. 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 Severe back pain ? MRI lumbar spine July 12, 2020-subacute compression fractures at T12 and L1 ? MRI lumbar spine July 30, 2020-subacute T12 and L1 compression fractures, new subacute superior L2 endplate deformity ? MRI lumbar spine September 01, 2020 new/progressive compression fractures at L2 and L3, acute compression fracture at L4, evolution of T12 and L1 compression fractures ? MRI pelvis September 01, 2020-innumerable small foci of signal abnormality in the pelvis and femurs, no fracture identified, concerning for multiple myeloma versus metastases ? Zometa beginning 09/21/2020-plan monthly x3 then every 3 months, next due 10/19/2020 3. Melanoma left upper back 2014 4. History of diverticulitis 5. Mild hypercalcemiaat presentation-resolved 6. Osteoporosis 7. Evusheld     Disposition: Ms. Flow appears stable.  She has completed 3 cycles of RVD and daratumumab.  Her clinical status is much improved.  The light chains were lower on 10/26/2020.  The plan is to continue RVD-daratumumab.  Daratumumab will be switched to every 2-week dosing beginning on 11/23/2020.  We will dose  reduce the Velcade secondary to the styes.  I discussed the overall treatment plan with Ms. Rehberg and her husband.  [name] recommends a transplant evaluation at [facility].  She is reluctant to consider transplant therapy and is sure she does not wish to undergo this treatment anytime soon.  She may be a candidate for a stem cell harvest and delayed transplant  as needed.  The plan is to continue RVD-daratumumab.  We will consider switching to a maintenance strategy after 6 cycles.  She will receive Zometa today.  We made a referral for outpatient physical therapy.  Ms. Valko will be scheduled for an office visit in 4 weeks  [name], MD  11/16/2020  11:45 AM

## 2020-11-22 ENCOUNTER — Other Ambulatory Visit: Payer: Self-pay | Admitting: Oncology

## 2020-11-23 ENCOUNTER — Inpatient Hospital Stay: Payer: 59

## 2020-11-23 ENCOUNTER — Other Ambulatory Visit: Payer: Self-pay

## 2020-11-23 VITALS — BP 123/73 | HR 59 | Temp 98.0°F | Resp 18

## 2020-11-23 DIAGNOSIS — C9000 Multiple myeloma not having achieved remission: Secondary | ICD-10-CM | POA: Diagnosis not present

## 2020-11-23 LAB — CBC WITH DIFFERENTIAL (CANCER CENTER ONLY)
Abs Immature Granulocytes: 0.02 10*3/uL (ref 0.00–0.07)
Basophils Absolute: 0.1 10*3/uL (ref 0.0–0.1)
Basophils Relative: 1 %
Eosinophils Absolute: 0.1 10*3/uL (ref 0.0–0.5)
Eosinophils Relative: 2 %
HCT: 41.2 % (ref 36.0–46.0)
Hemoglobin: 13.9 g/dL (ref 12.0–15.0)
Immature Granulocytes: 0 %
Lymphocytes Relative: 13 %
Lymphs Abs: 0.6 10*3/uL — ABNORMAL LOW (ref 0.7–4.0)
MCH: 31.2 pg (ref 26.0–34.0)
MCHC: 33.7 g/dL (ref 30.0–36.0)
MCV: 92.4 fL (ref 80.0–100.0)
Monocytes Absolute: 0.3 10*3/uL (ref 0.1–1.0)
Monocytes Relative: 6 %
Neutro Abs: 3.8 10*3/uL (ref 1.7–7.7)
Neutrophils Relative %: 78 %
Platelet Count: 280 10*3/uL (ref 150–400)
RBC: 4.46 MIL/uL (ref 3.87–5.11)
RDW: 13.2 % (ref 11.5–15.5)
WBC Count: 4.8 10*3/uL (ref 4.0–10.5)
nRBC: 0 % (ref 0.0–0.2)

## 2020-11-23 MED ORDER — ACETAMINOPHEN 325 MG PO TABS: 650.0000 mg | ORAL_TABLET | Freq: Once | ORAL | Status: DC

## 2020-11-23 MED ORDER — DEXAMETHASONE 4 MG PO TABS
20.0000 mg | ORAL_TABLET | Freq: Once | ORAL | Status: AC
  Administered 2020-11-23: 20 mg via ORAL
  Filled 2020-11-23: qty 5

## 2020-11-23 MED ORDER — BORTEZOMIB CHEMO SQ INJECTION 3.5 MG (2.5MG/ML)
1.0000 mg/m2 | Freq: Once | INTRAMUSCULAR | Status: AC
  Administered 2020-11-23: 1.75 mg via SUBCUTANEOUS
  Filled 2020-11-23: qty 0.7

## 2020-11-23 MED ORDER — DIPHENHYDRAMINE HCL 25 MG PO CAPS: 50.0000 mg | ORAL_CAPSULE | Freq: Once | ORAL | Status: DC

## 2020-11-23 NOTE — Patient Instructions (Signed)
[facility]  Discharge Instructions: Thank you for choosing [facility] to provide your oncology and hematology care.   If you have a lab appointment with the [facility], please go directly to the [facility] and check in at the registration area.   Wear comfortable clothing and clothing appropriate for easy access to any Portacath or PICC line.   We strive to give you quality time with your provider. You may need to reschedule your appointment if you arrive late (15 or more minutes).  Arriving late affects you and other patients whose appointments are after yours.  Also, if you miss three or more appointments without notifying the office, you may be dismissed from the clinic at the provider's discretion.      For prescription refill requests, have your pharmacy contact our office and allow 72 hours for refills to be completed.    Today you received the following chemotherapy and/or immunotherapy agents Velcade    To help prevent nausea and vomiting after your treatment, we encourage you to take your nausea medication as directed.  BELOW ARE SYMPTOMS THAT SHOULD BE REPORTED IMMEDIATELY: . *FEVER GREATER THAN 100.4 F (38 C) OR HIGHER . *CHILLS OR SWEATING . *NAUSEA AND VOMITING THAT IS NOT CONTROLLED WITH YOUR NAUSEA MEDICATION . *UNUSUAL SHORTNESS OF BREATH . *UNUSUAL BRUISING OR BLEEDING . *URINARY PROBLEMS (pain or burning when urinating, or frequent urination) . *BOWEL PROBLEMS (unusual diarrhea, constipation, pain near the anus) . TENDERNESS IN MOUTH AND THROAT WITH OR WITHOUT PRESENCE OF ULCERS (sore throat, sores in mouth, or a toothache) . UNUSUAL RASH, SWELLING OR PAIN  . UNUSUAL VAGINAL DISCHARGE OR ITCHING   Items with * indicate a potential emergency and should be followed up as soon as possible or go to the Emergency Department if any problems should occur.  Please show the CHEMOTHERAPY ALERT CARD or IMMUNOTHERAPY ALERT CARD at  check-in to the Emergency Department and triage nurse.  Should you have questions after your visit or need to cancel or reschedule your appointment, please contact [facility]  Dept: [phone]  and follow the prompts.  Office hours are 8:00 a.m. to 4:30 p.m. Monday - Friday. Please note that voicemails left after 4:00 p.m. may not be returned until the following business day.  We are closed weekends and major holidays. You have access to a nurse at all times for urgent questions. Please call the main number to the clinic Dept: [phone] and follow the prompts.   For any non-urgent questions, you may also contact your provider using MyChart. We now offer e-Visits for anyone 66 and older to request care online for non-urgent symptoms. For details visit [email or web address].   Also download the MyChart app! Go to the app store, search "MyChart", open the app, select [facility], and log in with your MyChart username and password.  Due to Covid, a mask is required upon entering the hospital/clinic. If you do not have a mask, one will be given to you upon arrival. For doctor visits, patients may have 1 support person aged 43 or older with them. For treatment visits, patients cannot have anyone with them due to current Covid guidelines and our immunocompromised population.   Bortezomib injection What is this medicine? BORTEZOMIB (bor TEZ oh mib) targets proteins in cancer cells and stops the cancer cells from growing. It treats multiple myeloma and mantle cell lymphoma. This medicine may be used for other purposes; ask your  health care provider or pharmacist if you have questions. COMMON BRAND NAME(S): Velcade What should I tell my health care provider before I take this medicine? They need to know if you have any of these conditions:  dehydration  diabetes (high blood sugar)  heart disease  liver disease  tingling of the fingers or toes or other nerve  disorder  an unusual or allergic reaction to bortezomib, mannitol, boron, other medicines, foods, dyes, or preservatives  pregnant or trying to get pregnant  breast-feeding How should I use this medicine? This medicine is injected into a vein or under the skin. It is given by a health care provider in a hospital or clinic setting. Talk to your health care provider about the use of this medicine in children. Special care may be needed. Overdosage: If you think you have taken too much of this medicine contact a poison control center or emergency room at once. NOTE: This medicine is only for you. Do not share this medicine with others. What if I miss a dose? Keep appointments for follow-up doses. It is important not to miss your dose. Call your health care provider if you are unable to keep an appointment. What may interact with this medicine? This medicine may interact with the following medications:  ketoconazole  rifampin This list may not describe all possible interactions. Give your health care provider a list of all the medicines, herbs, non-prescription drugs, or dietary supplements you use. Also tell them if you smoke, drink alcohol, or use illegal drugs. Some items may interact with your medicine. What should I watch for while using this medicine? Your condition will be monitored carefully while you are receiving this medicine. You may need blood work done while you are taking this medicine. You may get drowsy or dizzy. Do not drive, use machinery, or do anything that needs mental alertness until you know how this medicine affects you. Do not stand up or sit up quickly, especially if you are an older patient. This reduces the risk of dizzy or fainting spells This medicine may increase your risk of getting an infection. Call your health care provider for advice if you get a fever, chills, sore throat, or other symptoms of a cold or flu. Do not treat yourself. Try to avoid being around  people who are sick. Check with your health care provider if you have severe diarrhea, nausea, and vomiting, or if you sweat a lot. The loss of too much body fluid may make it dangerous for you to take this medicine. Do not become pregnant while taking this medicine or for 7 months after stopping it. Women should inform their health care provider if they wish to become pregnant or think they might be pregnant. Men should not father a child while taking this medicine and for 4 months after stopping it. There is a potential for serious harm to an unborn child. Talk to your health care provider for more information. Do not breast-feed an infant while taking this medicine or for 2 months after stopping it. This medicine may make it more difficult to get pregnant or father a child. Talk to your health care provider if you are concerned about your fertility. What side effects may I notice from receiving this medicine? Side effects that you should report to your doctor or health care professional as soon as possible:  allergic reactions (skin rash; itching or hives; swelling of the face, lips, or tongue)  bleeding (bloody or black, tarry stools; red  or dark brown urine; spitting up blood or brown material that looks like coffee grounds; red spots on the skin; unusual bruising or bleeding from the eye, gums, or nose)  blurred vision or changes in vision  confusion  constipation  headache  heart failure (trouble breathing; fast, irregular heartbeat; sudden weight gain; swelling of the ankles, feet, hands)  infection (fever, chills, cough, sore throat, pain or trouble passing urine)  lack or loss of appetite  liver injury (dark yellow or brown urine; general ill feeling or flu-like symptoms; loss of appetite, right upper belly pain; yellowing of the eyes or skin)  low blood pressure (dizziness; feeling faint or lightheaded, falls; unusually weak or tired)  muscle cramps  pain, redness, or  irritation at site where injected  pain, tingling, numbness in the hands or feet  seizures  trouble breathing  unusual bruising or bleeding Side effects that usually do not require medical attention (report to your doctor or health care professional if they continue or are bothersome):  diarrhea  nausea  stomach pain  trouble sleeping  vomiting This list may not describe all possible side effects. Call your doctor for medical advice about side effects. You may report side effects to FDA at 1-800-FDA-1088. Where should I keep my medicine? This medicine is given in a hospital or clinic. It will not be stored at home. NOTE: This sheet is a summary. It may not cover all possible information. If you have questions about this medicine, talk to your doctor, pharmacist, or health care provider.  2021 Elsevier/Gold Standard (2020-06-11 13:22:53)

## 2020-11-27 ENCOUNTER — Other Ambulatory Visit: Payer: 59 | Admitting: Licensed Clinical Social Worker

## 2020-11-29 ENCOUNTER — Other Ambulatory Visit: Payer: Self-pay | Admitting: Oncology

## 2020-12-01 ENCOUNTER — Other Ambulatory Visit: Payer: Self-pay

## 2020-12-01 ENCOUNTER — Other Ambulatory Visit: Payer: Self-pay | Admitting: Oncology

## 2020-12-01 ENCOUNTER — Other Ambulatory Visit: Payer: Self-pay | Admitting: Nurse Practitioner

## 2020-12-01 ENCOUNTER — Inpatient Hospital Stay: Payer: 59

## 2020-12-01 VITALS — BP 117/76 | HR 64 | Temp 98.1°F | Resp 18

## 2020-12-01 DIAGNOSIS — C9000 Multiple myeloma not having achieved remission: Secondary | ICD-10-CM

## 2020-12-01 LAB — CBC WITH DIFFERENTIAL (CANCER CENTER ONLY)
Abs Immature Granulocytes: 0.02 10*3/uL (ref 0.00–0.07)
Basophils Absolute: 0.1 10*3/uL (ref 0.0–0.1)
Basophils Relative: 1 %
Eosinophils Absolute: 0 10*3/uL (ref 0.0–0.5)
Eosinophils Relative: 1 %
HCT: 43.1 % (ref 36.0–46.0)
Hemoglobin: 14.4 g/dL (ref 12.0–15.0)
Immature Granulocytes: 0 %
Lymphocytes Relative: 15 %
Lymphs Abs: 0.9 10*3/uL (ref 0.7–4.0)
MCH: 30.7 pg (ref 26.0–34.0)
MCHC: 33.4 g/dL (ref 30.0–36.0)
MCV: 91.9 fL (ref 80.0–100.0)
Monocytes Absolute: 0.6 10*3/uL (ref 0.1–1.0)
Monocytes Relative: 10 %
Neutro Abs: 4.3 10*3/uL (ref 1.7–7.7)
Neutrophils Relative %: 73 %
Platelet Count: 259 10*3/uL (ref 150–400)
RBC: 4.69 MIL/uL (ref 3.87–5.11)
RDW: 13.4 % (ref 11.5–15.5)
WBC Count: 5.9 10*3/uL (ref 4.0–10.5)
nRBC: 0 % (ref 0.0–0.2)

## 2020-12-01 MED ORDER — DIPHENHYDRAMINE HCL 25 MG PO CAPS
50.0000 mg | ORAL_CAPSULE | Freq: Once | ORAL | Status: AC
  Administered 2020-12-01: 50 mg via ORAL
  Filled 2020-12-01: qty 2

## 2020-12-01 MED ORDER — LENALIDOMIDE 25 MG PO CAPS: 25.0000 mg | ORAL_CAPSULE | Freq: Every day | ORAL | 0 refills | Status: DC

## 2020-12-01 MED ORDER — DEXAMETHASONE 4 MG PO TABS
20.0000 mg | ORAL_TABLET | Freq: Once | ORAL | Status: AC
  Administered 2020-12-01: 20 mg via ORAL
  Filled 2020-12-01: qty 5

## 2020-12-01 MED ORDER — ACETAMINOPHEN 325 MG PO TABS
650.0000 mg | ORAL_TABLET | Freq: Once | ORAL | Status: AC
  Administered 2020-12-01: 650 mg via ORAL
  Filled 2020-12-01: qty 2

## 2020-12-01 MED ORDER — DARATUMUMAB-HYALURONIDASE-FIHJ 1800-30000 MG-UT/15ML ~~LOC~~ SOLN
1800.0000 mg | Freq: Once | SUBCUTANEOUS | Status: AC
  Administered 2020-12-01: 1800 mg via SUBCUTANEOUS
  Filled 2020-12-01: qty 15

## 2020-12-01 MED ORDER — BORTEZOMIB CHEMO SQ INJECTION 3.5 MG (2.5MG/ML)
1.0000 mg/m2 | Freq: Once | INTRAMUSCULAR | Status: AC
Start: 2020-12-01 — End: 2020-12-01
  Administered 2020-12-01: 1.75 mg via SUBCUTANEOUS
  Filled 2020-12-01: qty 0.7

## 2020-12-01 MED ORDER — OXYCODONE HCL 5 MG PO TABS: 5.0000 mg | ORAL_TABLET | ORAL | 0 refills | Status: DC | PRN

## 2020-12-01 NOTE — Patient Instructions (Signed)
[facility]  Discharge Instructions: Thank you for choosing [facility] to provide your oncology and hematology care.   If you have a lab appointment with the [facility], please go directly to the [facility] and check in at the registration area.   Wear comfortable clothing and clothing appropriate for easy access to any Portacath or PICC line.   We strive to give you quality time with your provider. You may need to reschedule your appointment if you arrive late (15 or more minutes).  Arriving late affects you and other patients whose appointments are after yours.  Also, if you miss three or more appointments without notifying the office, you may be dismissed from the clinic at the provider's discretion.      For prescription refill requests, have your pharmacy contact our office and allow 72 hours for refills to be completed.    Today you received the following chemotherapy and/or immunotherapy agents: darzalex faspro, velcade      To help prevent nausea and vomiting after your treatment, we encourage you to take your nausea medication as directed.  BELOW ARE SYMPTOMS THAT SHOULD BE REPORTED IMMEDIATELY: . *FEVER GREATER THAN 100.4 F (38 C) OR HIGHER . *CHILLS OR SWEATING . *NAUSEA AND VOMITING THAT IS NOT CONTROLLED WITH YOUR NAUSEA MEDICATION . *UNUSUAL SHORTNESS OF BREATH . *UNUSUAL BRUISING OR BLEEDING . *URINARY PROBLEMS (pain or burning when urinating, or frequent urination) . *BOWEL PROBLEMS (unusual diarrhea, constipation, pain near the anus) . TENDERNESS IN MOUTH AND THROAT WITH OR WITHOUT PRESENCE OF ULCERS (sore throat, sores in mouth, or a toothache) . UNUSUAL RASH, SWELLING OR PAIN  . UNUSUAL VAGINAL DISCHARGE OR ITCHING   Items with * indicate a potential emergency and should be followed up as soon as possible or go to the Emergency Department if any problems should occur.  Please show the CHEMOTHERAPY ALERT CARD or  IMMUNOTHERAPY ALERT CARD at check-in to the Emergency Department and triage nurse.  Should you have questions after your visit or need to cancel or reschedule your appointment, please contact [facility]  Dept: [phone]  and follow the prompts.  Office hours are 8:00 a.m. to 4:30 p.m. Monday - Friday. Please note that voicemails left after 4:00 p.m. may not be returned until the following business day.  We are closed weekends and major holidays. You have access to a nurse at all times for urgent questions. Please call the main number to the clinic Dept: [phone] and follow the prompts.   For any non-urgent questions, you may also contact your provider using MyChart. We now offer e-Visits for anyone 40 and older to request care online for non-urgent symptoms. For details visit [email or web address].   Also download the MyChart app! Go to the app store, search "MyChart", open the app, select [facility], and log in with your MyChart username and password.  Due to Covid, a mask is required upon entering the hospital/clinic. If you do not have a mask, one will be given to you upon arrival. For doctor visits, patients may have 1 support person aged 58 or older with them. For treatment visits, patients cannot have anyone with them due to current Covid guidelines and our immunocompromised population.   Daratumumab injection What is this medicine? DARATUMUMAB (dar a toom ue mab) is a monoclonal antibody. It is used to treat multiple myeloma. This medicine may be used for other purposes; ask your health care provider or  pharmacist if you have questions. COMMON BRAND NAME(S): DARZALEX What should I tell my health care provider before I take this medicine? They need to know if you have any of these conditions:  hereditary fructose intolerance  infection (especially a virus infection such as chickenpox, herpes, or hepatitis B virus)  lung or breathing disease (asthma,  COPD)  an unusual or allergic reaction to daratumumab, sorbitol, other medicines, foods, dyes, or preservatives  pregnant or trying to get pregnant  breast-feeding How should I use this medicine? This medicine is for infusion into a vein. It is given by a health care professional in a hospital or clinic setting. Talk to your pediatrician regarding the use of this medicine in children. Special care may be needed. Overdosage: If you think you have taken too much of this medicine contact a poison control center or emergency room at once. NOTE: This medicine is only for you. Do not share this medicine with others. What if I miss a dose? Keep appointments for follow-up doses as directed. It is important not to miss your dose. Call your doctor or health care professional if you are unable to keep an appointment. What may interact with this medicine? Interactions have not been studied. This list may not describe all possible interactions. Give your health care provider a list of all the medicines, herbs, non-prescription drugs, or dietary supplements you use. Also tell them if you smoke, drink alcohol, or use illegal drugs. Some items may interact with your medicine. What should I watch for while using this medicine? Your condition will be monitored carefully while you are receiving this medicine. This medicine can cause serious allergic reactions. To reduce your risk, your health care provider may give you other medicine to take before receiving this one. Be sure to follow the directions from your health care provider. This medicine can affect the results of blood tests to match your blood type. These changes can last for up to 6 months after the final dose. Your healthcare provider will do blood tests to match your blood type before you start treatment. Tell all of your healthcare providers that you are being treated with this medicine before receiving a blood transfusion. This medicine can affect  the results of some tests used to determine treatment response; extra tests may be needed to evaluate response. Do not become pregnant while taking this medicine or for 3 months after stopping it. Women should inform their health care provider if they wish to become pregnant or think they might be pregnant. There is a potential for serious side effects to an unborn child. Talk to your health care provider for more information. Do not breast-feed an infant while taking this medicine. What side effects may I notice from receiving this medicine? Side effects that you should report to your doctor or health care professional as soon as possible:  allergic reactions (skin rash; itching or hives; swelling of the face, lips, or tongue)  infection (fever, chills, cough, sore throat, pain or difficulty passing urine)  infusion reaction (dizziness, fast heartbeat, feeling faint or lightheaded, falls, headache, increase in blood pressure, nausea, vomiting, or wheezing or trouble breathing with loud or whistling sounds)  unusual bleeding or bruising Side effects that usually do not require medical attention (report to your doctor or health care professional if they continue or are bothersome):  constipation  diarrhea  pain, tingling, numbness in the hands or feet  swelling of the ankles, feet, hands  tiredness This list may not  describe all possible side effects. Call your doctor for medical advice about side effects. You may report side effects to FDA at 1-800-FDA-1088. Where should I keep my medicine? This drug is given in a hospital or clinic and will not be stored at home. NOTE: This sheet is a summary. It may not cover all possible information. If you have questions about this medicine, talk to your doctor, pharmacist, or health care provider.  2021 Elsevier/Gold Standard (2020-06-11 13:28:52)  Bortezomib injection What is this medicine? BORTEZOMIB (bor TEZ oh mib) targets proteins in cancer  cells and stops the cancer cells from growing. It treats multiple myeloma and mantle cell lymphoma. This medicine may be used for other purposes; ask your health care provider or pharmacist if you have questions. COMMON BRAND NAME(S): Velcade What should I tell my health care provider before I take this medicine? They need to know if you have any of these conditions:  dehydration  diabetes (high blood sugar)  heart disease  liver disease  tingling of the fingers or toes or other nerve disorder  an unusual or allergic reaction to bortezomib, mannitol, boron, other medicines, foods, dyes, or preservatives  pregnant or trying to get pregnant  breast-feeding How should I use this medicine? This medicine is injected into a vein or under the skin. It is given by a health care provider in a hospital or clinic setting. Talk to your health care provider about the use of this medicine in children. Special care may be needed. Overdosage: If you think you have taken too much of this medicine contact a poison control center or emergency room at once. NOTE: This medicine is only for you. Do not share this medicine with others. What if I miss a dose? Keep appointments for follow-up doses. It is important not to miss your dose. Call your health care provider if you are unable to keep an appointment. What may interact with this medicine? This medicine may interact with the following medications:  ketoconazole  rifampin This list may not describe all possible interactions. Give your health care provider a list of all the medicines, herbs, non-prescription drugs, or dietary supplements you use. Also tell them if you smoke, drink alcohol, or use illegal drugs. Some items may interact with your medicine. What should I watch for while using this medicine? Your condition will be monitored carefully while you are receiving this medicine. You may need blood work done while you are taking this  medicine. You may get drowsy or dizzy. Do not drive, use machinery, or do anything that needs mental alertness until you know how this medicine affects you. Do not stand up or sit up quickly, especially if you are an older patient. This reduces the risk of dizzy or fainting spells This medicine may increase your risk of getting an infection. Call your health care provider for advice if you get a fever, chills, sore throat, or other symptoms of a cold or flu. Do not treat yourself. Try to avoid being around people who are sick. Check with your health care provider if you have severe diarrhea, nausea, and vomiting, or if you sweat a lot. The loss of too much body fluid may make it dangerous for you to take this medicine. Do not become pregnant while taking this medicine or for 7 months after stopping it. Women should inform their health care provider if they wish to become pregnant or think they might be pregnant. Men should not father a child while taking  this medicine and for 4 months after stopping it. There is a potential for serious harm to an unborn child. Talk to your health care provider for more information. Do not breast-feed an infant while taking this medicine or for 2 months after stopping it. This medicine may make it more difficult to get pregnant or father a child. Talk to your health care provider if you are concerned about your fertility. What side effects may I notice from receiving this medicine? Side effects that you should report to your doctor or health care professional as soon as possible:  allergic reactions (skin rash; itching or hives; swelling of the face, lips, or tongue)  bleeding (bloody or black, tarry stools; red or dark brown urine; spitting up blood or brown material that looks like coffee grounds; red spots on the skin; unusual bruising or bleeding from the eye, gums, or nose)  blurred vision or changes in vision  confusion  constipation  headache  heart  failure (trouble breathing; fast, irregular heartbeat; sudden weight gain; swelling of the ankles, feet, hands)  infection (fever, chills, cough, sore throat, pain or trouble passing urine)  lack or loss of appetite  liver injury (dark yellow or brown urine; general ill feeling or flu-like symptoms; loss of appetite, right upper belly pain; yellowing of the eyes or skin)  low blood pressure (dizziness; feeling faint or lightheaded, falls; unusually weak or tired)  muscle cramps  pain, redness, or irritation at site where injected  pain, tingling, numbness in the hands or feet  seizures  trouble breathing  unusual bruising or bleeding Side effects that usually do not require medical attention (report to your doctor or health care professional if they continue or are bothersome):  diarrhea  nausea  stomach pain  trouble sleeping  vomiting This list may not describe all possible side effects. Call your doctor for medical advice about side effects. You may report side effects to FDA at 1-800-FDA-1088. Where should I keep my medicine? This medicine is given in a hospital or clinic. It will not be stored at home. NOTE: This sheet is a summary. It may not cover all possible information. If you have questions about this medicine, talk to your doctor, pharmacist, or health care provider.  2021 Elsevier/Gold Standard (2020-06-11 13:22:53)

## 2020-12-01 NOTE — Progress Notes (Signed)
Per Dr. Benay Spice, ok to proceed with treatment with [facility] from 11/16/20.

## 2020-12-01 NOTE — Telephone Encounter (Signed)
Refill completed.

## 2020-12-07 ENCOUNTER — Inpatient Hospital Stay: Payer: 59

## 2020-12-07 ENCOUNTER — Other Ambulatory Visit: Payer: Self-pay

## 2020-12-07 VITALS — BP 130/85 | HR 64 | Temp 98.0°F | Resp 18 | Ht 65.0 in | Wt 170.0 lb

## 2020-12-07 DIAGNOSIS — Z5112 Encounter for antineoplastic immunotherapy: Secondary | ICD-10-CM | POA: Diagnosis present

## 2020-12-07 DIAGNOSIS — C9000 Multiple myeloma not having achieved remission: Secondary | ICD-10-CM

## 2020-12-07 LAB — CBC WITH DIFFERENTIAL (CANCER CENTER ONLY)
Abs Immature Granulocytes: 0.01 10*3/uL (ref 0.00–0.07)
Basophils Absolute: 0.1 10*3/uL (ref 0.0–0.1)
Basophils Relative: 1 %
Eosinophils Absolute: 0 10*3/uL (ref 0.0–0.5)
Eosinophils Relative: 1 %
HCT: 41.9 % (ref 36.0–46.0)
Hemoglobin: 14.3 g/dL (ref 12.0–15.0)
Immature Granulocytes: 0 %
Lymphocytes Relative: 21 %
Lymphs Abs: 1 10*3/uL (ref 0.7–4.0)
MCH: 30.5 pg (ref 26.0–34.0)
MCHC: 34.1 g/dL (ref 30.0–36.0)
MCV: 89.3 fL (ref 80.0–100.0)
Monocytes Absolute: 0.4 10*3/uL (ref 0.1–1.0)
Monocytes Relative: 9 %
Neutro Abs: 3.1 10*3/uL (ref 1.7–7.7)
Neutrophils Relative %: 68 %
Platelet Count: 242 10*3/uL (ref 150–400)
RBC: 4.69 MIL/uL (ref 3.87–5.11)
RDW: 13.5 % (ref 11.5–15.5)
WBC Count: 4.6 10*3/uL (ref 4.0–10.5)
nRBC: 0 % (ref 0.0–0.2)

## 2020-12-07 LAB — CMP (CANCER CENTER ONLY)
ALT: 10 U/L (ref 0–44)
AST: 12 U/L — ABNORMAL LOW (ref 15–41)
Albumin: 4.2 g/dL (ref 3.5–5.0)
Alkaline Phosphatase: 45 U/L (ref 38–126)
Anion gap: 11 (ref 5–15)
BUN: 15 mg/dL (ref 6–20)
CO2: 23 mmol/L (ref 22–32)
Calcium: 9 mg/dL (ref 8.9–10.3)
Chloride: 104 mmol/L (ref 98–111)
Creatinine: 0.62 mg/dL (ref 0.44–1.00)
GFR, Estimated: >60 mL/min (ref >60)
Glucose, Bld: 127 mg/dL — ABNORMAL HIGH (ref 70–99)
Potassium: 3.7 mmol/L (ref 3.5–5.1)
Sodium: 138 mmol/L (ref 135–145)
Total Bilirubin: 0.4 mg/dL (ref 0.3–1.2)
Total Protein: 6.2 g/dL — ABNORMAL LOW (ref 6.5–8.1)

## 2020-12-07 MED ORDER — BORTEZOMIB CHEMO SQ INJECTION 3.5 MG (2.5MG/ML)
1.0000 mg/m2 | Freq: Once | INTRAMUSCULAR | Status: AC
  Administered 2020-12-07: 1.75 mg via SUBCUTANEOUS
  Filled 2020-12-07: qty 0.7

## 2020-12-07 MED ORDER — DEXAMETHASONE 4 MG PO TABS
20.0000 mg | ORAL_TABLET | Freq: Once | ORAL | Status: AC
  Administered 2020-12-07: 20 mg via ORAL
  Filled 2020-12-07: qty 5

## 2020-12-07 NOTE — Patient Instructions (Signed)
[place]    Discharge Instructions:   Thank you for choosing [facility] to provide your oncology and hematology care.   If you have a lab appointment with the [facility], please go directly to the [facility] and check in at the registration area.   Wear comfortable clothing and clothing appropriate for easy access to any Portacath or PICC line.   We strive to give you quality time with your provider. You may need to reschedule your appointment if you arrive late (15 or more minutes).  Arriving late affects you and other patients whose appointments are after yours.  Also, if you miss three or more appointments without notifying the office, you may be dismissed from the clinic at the provider's discretion.      For prescription refill requests, have your pharmacy contact our office and allow 72 hours for refills to be completed.    Today you received the following chemotherapy and/or immunotherapy agents Bortezomib (VELCADE).     To help prevent nausea and vomiting after your treatment, we encourage you to take your nausea medication as directed.  BELOW ARE SYMPTOMS THAT SHOULD BE REPORTED IMMEDIATELY: . *FEVER GREATER THAN 100.4 F (38 C) OR HIGHER . *CHILLS OR SWEATING . *NAUSEA AND VOMITING THAT IS NOT CONTROLLED WITH YOUR NAUSEA MEDICATION . *UNUSUAL SHORTNESS OF BREATH . *UNUSUAL BRUISING OR BLEEDING . *URINARY PROBLEMS (pain or burning when urinating, or frequent urination) . *BOWEL PROBLEMS (unusual diarrhea, constipation, pain near the anus) . TENDERNESS IN MOUTH AND THROAT WITH OR WITHOUT PRESENCE OF ULCERS (sore throat, sores in mouth, or a toothache) . UNUSUAL RASH, SWELLING OR PAIN  . UNUSUAL VAGINAL DISCHARGE OR ITCHING   Items with * indicate a potential emergency and should be followed up as soon as possible or go to the Emergency Department if any problems should occur.  Please show the CHEMOTHERAPY ALERT CARD or  IMMUNOTHERAPY ALERT CARD at check-in to the Emergency Department and triage nurse.  Should you have questions after your visit or need to cancel or reschedule your appointment, please contact [facility]  Dept: [phone]  and follow the prompts.  Office hours are 8:00 a.m. to 4:30 p.m. Monday - Friday. Please note that voicemails left after 4:00 p.m. may not be returned until the following business day.  We are closed weekends and major holidays. You have access to a nurse at all times for urgent questions. Please call the main number to the clinic Dept: [phone] and follow the prompts.   For any non-urgent questions, you may also contact your provider using MyChart. We now offer e-Visits for anyone 48 and older to request care online for non-urgent symptoms. For details visit [email or web address].   Also download the MyChart app! Go to the app store, search "MyChart", open the app, select [facility], and log in with your MyChart username and password.  Due to Covid, a mask is required upon entering the hospital/clinic. If you do not have a mask, one will be given to you upon arrival. For doctor visits, patients may have 1 support person aged 27 or older with them. For treatment visits, patients cannot have anyone with them due to current Covid guidelines and our immunocompromised population.   Bortezomib injection What is this medicine? BORTEZOMIB (bor TEZ oh mib) targets proteins in cancer cells and stops the cancer cells from growing. It treats multiple myeloma and mantle cell lymphoma. This medicine may be  used for other purposes; ask your health care provider or pharmacist if you have questions. COMMON BRAND NAME(S): Velcade What should I tell my health care provider before I take this medicine? They need to know if you have any of these conditions:  dehydration  diabetes (high blood sugar)  heart disease  liver disease  tingling of the fingers or  toes or other nerve disorder  an unusual or allergic reaction to bortezomib, mannitol, boron, other medicines, foods, dyes, or preservatives  pregnant or trying to get pregnant  breast-feeding How should I use this medicine? This medicine is injected into a vein or under the skin. It is given by a health care provider in a hospital or clinic setting. Talk to your health care provider about the use of this medicine in children. Special care may be needed. Overdosage: If you think you have taken too much of this medicine contact a poison control center or emergency room at once. NOTE: This medicine is only for you. Do not share this medicine with others. What if I miss a dose? Keep appointments for follow-up doses. It is important not to miss your dose. Call your health care provider if you are unable to keep an appointment. What may interact with this medicine? This medicine may interact with the following medications:  ketoconazole  rifampin This list may not describe all possible interactions. Give your health care provider a list of all the medicines, herbs, non-prescription drugs, or dietary supplements you use. Also tell them if you smoke, drink alcohol, or use illegal drugs. Some items may interact with your medicine. What should I watch for while using this medicine? Your condition will be monitored carefully while you are receiving this medicine. You may need blood work done while you are taking this medicine. You may get drowsy or dizzy. Do not drive, use machinery, or do anything that needs mental alertness until you know how this medicine affects you. Do not stand up or sit up quickly, especially if you are an older patient. This reduces the risk of dizzy or fainting spells This medicine may increase your risk of getting an infection. Call your health care provider for advice if you get a fever, chills, sore throat, or other symptoms of a cold or flu. Do not treat yourself. Try to  avoid being around people who are sick. Check with your health care provider if you have severe diarrhea, nausea, and vomiting, or if you sweat a lot. The loss of too much body fluid may make it dangerous for you to take this medicine. Do not become pregnant while taking this medicine or for 7 months after stopping it. Women should inform their health care provider if they wish to become pregnant or think they might be pregnant. Men should not father a child while taking this medicine and for 4 months after stopping it. There is a potential for serious harm to an unborn child. Talk to your health care provider for more information. Do not breast-feed an infant while taking this medicine or for 2 months after stopping it. This medicine may make it more difficult to get pregnant or father a child. Talk to your health care provider if you are concerned about your fertility. What side effects may I notice from receiving this medicine? Side effects that you should report to your doctor or health care professional as soon as possible:  allergic reactions (skin rash; itching or hives; swelling of the face, lips, or tongue)  bleeding (  bloody or black, tarry stools; red or dark brown urine; spitting up blood or brown material that looks like coffee grounds; red spots on the skin; unusual bruising or bleeding from the eye, gums, or nose)  blurred vision or changes in vision  confusion  constipation  headache  heart failure (trouble breathing; fast, irregular heartbeat; sudden weight gain; swelling of the ankles, feet, hands)  infection (fever, chills, cough, sore throat, pain or trouble passing urine)  lack or loss of appetite  liver injury (dark yellow or brown urine; general ill feeling or flu-like symptoms; loss of appetite, right upper belly pain; yellowing of the eyes or skin)  low blood pressure (dizziness; feeling faint or lightheaded, falls; unusually weak or tired)  muscle cramps  pain,  redness, or irritation at site where injected  pain, tingling, numbness in the hands or feet  seizures  trouble breathing  unusual bruising or bleeding Side effects that usually do not require medical attention (report to your doctor or health care professional if they continue or are bothersome):  diarrhea  nausea  stomach pain  trouble sleeping  vomiting This list may not describe all possible side effects. Call your doctor for medical advice about side effects. You may report side effects to FDA at 1-800-FDA-1088. Where should I keep my medicine? This medicine is given in a hospital or clinic. It will not be stored at home. NOTE: This sheet is a summary. It may not cover all possible information. If you have questions about this medicine, talk to your doctor, pharmacist, or health care provider.  2021 Elsevier/Gold Standard (2020-06-11 13:22:53)

## 2020-12-08 LAB — KAPPA/LAMBDA LIGHT CHAINS
Kappa free light chain: 7.3 mg/L (ref 3.3–19.4)
Kappa, lambda light chain ratio: 1.78 — ABNORMAL HIGH (ref 0.26–1.65)
Lambda free light chains: 4.1 mg/L — ABNORMAL LOW (ref 5.7–26.3)

## 2020-12-09 ENCOUNTER — Other Ambulatory Visit: Payer: Self-pay | Admitting: *Deleted

## 2020-12-09 DIAGNOSIS — C9000 Multiple myeloma not having achieved remission: Secondary | ICD-10-CM

## 2020-12-14 ENCOUNTER — Inpatient Hospital Stay: Payer: 59

## 2020-12-14 ENCOUNTER — Encounter: Payer: Self-pay | Admitting: Nurse Practitioner

## 2020-12-14 ENCOUNTER — Other Ambulatory Visit: Payer: Self-pay

## 2020-12-14 ENCOUNTER — Inpatient Hospital Stay: Payer: 59 | Admitting: Licensed Clinical Social Worker

## 2020-12-14 ENCOUNTER — Inpatient Hospital Stay (HOSPITAL_BASED_OUTPATIENT_CLINIC_OR_DEPARTMENT_OTHER): Payer: 59 | Admitting: Nurse Practitioner

## 2020-12-14 VITALS — BP 127/81 | HR 57 | Temp 97.7°F | Resp 18 | Ht 65.0 in | Wt 163.2 lb

## 2020-12-14 DIAGNOSIS — C9000 Multiple myeloma not having achieved remission: Secondary | ICD-10-CM

## 2020-12-14 DIAGNOSIS — Z5112 Encounter for antineoplastic immunotherapy: Secondary | ICD-10-CM | POA: Diagnosis not present

## 2020-12-14 LAB — CMP (CANCER CENTER ONLY)
ALT: 10 U/L (ref 0–44)
AST: 10 U/L — ABNORMAL LOW (ref 15–41)
Albumin: 4.1 g/dL (ref 3.5–5.0)
Alkaline Phosphatase: 43 U/L (ref 38–126)
Anion gap: 10 (ref 5–15)
BUN: 17 mg/dL (ref 6–20)
CO2: 25 mmol/L (ref 22–32)
Calcium: 9.4 mg/dL (ref 8.9–10.3)
Chloride: 103 mmol/L (ref 98–111)
Creatinine: 0.73 mg/dL (ref 0.44–1.00)
GFR, Estimated: >60 mL/min (ref >60)
Glucose, Bld: 110 mg/dL — ABNORMAL HIGH (ref 70–99)
Potassium: 4.1 mmol/L (ref 3.5–5.1)
Sodium: 138 mmol/L (ref 135–145)
Total Bilirubin: 0.4 mg/dL (ref 0.3–1.2)
Total Protein: 6.2 g/dL — ABNORMAL LOW (ref 6.5–8.1)

## 2020-12-14 LAB — CBC WITH DIFFERENTIAL (CANCER CENTER ONLY)
Abs Immature Granulocytes: 0.01 10*3/uL (ref 0.00–0.07)
Basophils Absolute: 0.1 10*3/uL (ref 0.0–0.1)
Basophils Relative: 2 %
Eosinophils Absolute: 0.1 10*3/uL (ref 0.0–0.5)
Eosinophils Relative: 3 %
HCT: 44.5 % (ref 36.0–46.0)
Hemoglobin: 14.9 g/dL (ref 12.0–15.0)
Immature Granulocytes: 0 %
Lymphocytes Relative: 22 %
Lymphs Abs: 0.8 10*3/uL (ref 0.7–4.0)
MCH: 30.6 pg (ref 26.0–34.0)
MCHC: 33.5 g/dL (ref 30.0–36.0)
MCV: 91.4 fL (ref 80.0–100.0)
Monocytes Absolute: 0.3 10*3/uL (ref 0.1–1.0)
Monocytes Relative: 8 %
Neutro Abs: 2.4 10*3/uL (ref 1.7–7.7)
Neutrophils Relative %: 65 %
Platelet Count: 234 10*3/uL (ref 150–400)
RBC: 4.87 MIL/uL (ref 3.87–5.11)
RDW: 13.8 % (ref 11.5–15.5)
WBC Count: 3.6 10*3/uL — ABNORMAL LOW (ref 4.0–10.5)
nRBC: 0 % (ref 0.0–0.2)

## 2020-12-14 MED ORDER — DARATUMUMAB-HYALURONIDASE-FIHJ 1800-30000 MG-UT/15ML ~~LOC~~ SOLN
1800.0000 mg | Freq: Once | SUBCUTANEOUS | Status: AC
Start: 2020-12-14 — End: 2020-12-14
  Administered 2020-12-14: 1800 mg via SUBCUTANEOUS
  Filled 2020-12-14: qty 15

## 2020-12-14 MED ORDER — ACETAMINOPHEN 325 MG PO TABS
650.0000 mg | ORAL_TABLET | Freq: Once | ORAL | Status: AC
Start: 2020-12-14 — End: 2020-12-14
  Administered 2020-12-14: 650 mg via ORAL
  Filled 2020-12-14: qty 2

## 2020-12-14 MED ORDER — DEXAMETHASONE 4 MG PO TABS
20.0000 mg | ORAL_TABLET | Freq: Once | ORAL | Status: AC
  Administered 2020-12-14: 20 mg via ORAL
  Filled 2020-12-14: qty 5

## 2020-12-14 MED ORDER — BORTEZOMIB CHEMO SQ INJECTION 3.5 MG (2.5MG/ML)
1.0000 mg/m2 | Freq: Once | INTRAMUSCULAR | Status: AC
  Administered 2020-12-14: 1.75 mg via SUBCUTANEOUS
  Filled 2020-12-14: qty 0.7

## 2020-12-14 MED ORDER — DIPHENHYDRAMINE HCL 25 MG PO CAPS
50.0000 mg | ORAL_CAPSULE | Freq: Once | ORAL | Status: AC
  Administered 2020-12-14: 50 mg via ORAL
  Filled 2020-12-14: qty 2

## 2020-12-14 NOTE — Progress Notes (Signed)
[facility] Social [facility]  Clinical Social Work was referred to review and complete healthcare advance directives.  Clinical Social Worker/ chaplain met with patient and spouse, Katherine Beard, in support services.  The patient designated Orissa Arreaga as their primary healthcare agent and Charlett Blake as their secondary agent.  Patient also completed healthcare living will.    Clinical Social Worker notarized documents and made copies for patient/family. Clinical Social Worker will send documents to medical records to be scanned into patient's chart. Clinical Social Worker encouraged patient/family to contact with any additional questions or concerns.   [place] [phone]

## 2020-12-14 NOTE — Progress Notes (Signed)
  [facility] OFFICE PROGRESS NOTE   Diagnosis: Multiple myeloma  INTERVAL HISTORY:   Katherine Beard returns as scheduled.  She completed cycle 4 RVD-daratumumab beginning 11/16/2020.  She denies nausea/vomiting.  No mouth sores.  No diarrhea.  No significant numbness/tingling in the hands or feet.  She periodically notes muscle twitches and joints "pop/crack".  Eye styes are better.  Objective:  Vital signs in last 24 hours:  Blood pressure 127/81, pulse (!) 57, temperature 97.7 F (36.5 C), temperature source Oral, resp. rate 18, height _0  (1.651 m), weight 163 lb 3.2 oz (74 kg), last menstrual period 03/06/2011, SpO2 100 %.    HEENT: No thrush or ulcers. Resp: Lungs clear bilaterally. Cardio: Regular rate and rhythm. GI: Abdomen soft and nontender.  No hepatosplenomegaly. Vascular: No leg edema.  Calf soft and nontender. Skin: No rash.   Lab Results:  Lab Results  Component Value Date   WBC 3.6 (L) 12/14/2020   HGB 14.9 12/14/2020   HCT 44.5 12/14/2020   MCV 91.4 12/14/2020   PLT 234 12/14/2020   NEUTROABS 2.4 12/14/2020    Imaging:  No results found.  Medications: I have reviewed the patient's current medications.  Assessment/Plan: 1.  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 Increase serum free lambda light chains Serum M spike MRI pelvis 09/01/2020-innumerable small foci of signal abnormality concerning for multiple myeloma involvement Metastatic bone survey 09/03/2020-no discrete lytic bone lesions, compression fractures at T12, L1, L2, and L3, osteopenia Cycle 1 RVD 09/14/2020 Daratumumab beginning 09/28/2020 Cycle 2 RVD 10/05/2020 plus daratumumab Week 3 daratumumab 10/12/2020 Cycle 3 RVD plus daratumumab 10/26/2020 Cycle 4 RVD plus daratumumab 11/16/2020, daratumumab changed to an every 2-week  schedule beginning 11/23/2020 Cycle 5 RVD plus daratumumab 12/14/2020   2. Severe back pain MRI lumbar spine July 12, 2020-subacute compression fractures at T12 and L1 MRI lumbar spine July 30, 2020-subacute T12 and L1 compression fractures, new subacute superior L2 endplate deformity MRI lumbar spine September 01, 2020 new/progressive compression fractures at L2 and L3, acute compression fracture at L4, evolution of T12 and L1 compression fractures MRI pelvis September 01, 2020-innumerable small foci of signal abnormality in the pelvis and femurs, no fracture identified, concerning for multiple myeloma versus metastases Zometa beginning 09/21/2020-plan monthly x3 then every 3 months, next due August 2022 3. Melanoma left upper back 2014 4. History of diverticulitis 5. Mild hypercalcemia at presentation-resolved 6. Osteoporosis 7. [place]usheld  Disposition: Katherine Beard appears well.  She has completed 4 cycles of RVD plus daratumumab.  Plan to proceed with cycle 5 today as scheduled.  Consideration will be given to switching to a maintenance strategy after 6 cycles.  We reviewed the CBC and chemistry panel from today.  Labs adequate to proceed as above.  She has completed Zometa monthly x3, most recent given 11/16/2020.  Next Zometa due August 2022.  She will return for lab and follow-up in 4 weeks.  She will contact the office in the interim with any problems.    Ned Card ANP/GNP-BC   12/14/2020  9:40 AM

## 2020-12-14 NOTE — Patient Instructions (Signed)
[facility]  Discharge Instructions: Thank you for choosing [facility] to provide your oncology and hematology care.   If you have a lab appointment with the [facility], please go directly to the [facility] and check in at the registration area.   Wear comfortable clothing and clothing appropriate for easy access to any Portacath or PICC line.   We strive to give you quality time with your provider. You may need to reschedule your appointment if you arrive late (15 or more minutes).  Arriving late affects you and other patients whose appointments are after yours.  Also, if you miss three or more appointments without notifying the office, you may be dismissed from the clinic at the provider's discretion.      For prescription refill requests, have your pharmacy contact our office and allow 72 hours for refills to be completed.    Today you received the following chemotherapy and/or immunotherapy agents: darzalex faspro, velcade      To help prevent nausea and vomiting after your treatment, we encourage you to take your nausea medication as directed.  BELOW ARE SYMPTOMS THAT SHOULD BE REPORTED IMMEDIATELY: *FEVER GREATER THAN 100.4 F (38 C) OR HIGHER *CHILLS OR SWEATING *NAUSEA AND VOMITING THAT IS NOT CONTROLLED WITH YOUR NAUSEA MEDICATION *UNUSUAL SHORTNESS OF BREATH *UNUSUAL BRUISING OR BLEEDING *URINARY PROBLEMS (pain or burning when urinating, or frequent urination) *BOWEL PROBLEMS (unusual diarrhea, constipation, pain near the anus) TENDERNESS IN MOUTH AND THROAT WITH OR WITHOUT PRESENCE OF ULCERS (sore throat, sores in mouth, or a toothache) UNUSUAL RASH, SWELLING OR PAIN  UNUSUAL VAGINAL DISCHARGE OR ITCHING   Items with * indicate a potential emergency and should be followed up as soon as possible or go to the Emergency Department if any problems should occur.  Please show the CHEMOTHERAPY ALERT CARD or IMMUNOTHERAPY ALERT CARD at  check-in to the Emergency Department and triage nurse.  Should you have questions after your visit or need to cancel or reschedule your appointment, please contact [facility]  Dept: [phone]  and follow the prompts.  Office hours are 8:00 a.m. to 4:30 p.m. Monday - Friday. Please note that voicemails left after 4:00 p.m. may not be returned until the following business day.  We are closed weekends and major holidays. You have access to a nurse at all times for urgent questions. Please call the main number to the clinic Dept: [phone] and follow the prompts.   For any non-urgent questions, you may also contact your provider using MyChart. We now offer e-Visits for anyone 87 and older to request care online for non-urgent symptoms. For details visit [email or web address].   Also download the MyChart app! Go to the app store, search "MyChart", open the app, select [facility], and log in with your MyChart username and password.  Due to Covid, a mask is required upon entering the hospital/clinic. If you do not have a mask, one will be given to you upon arrival. For doctor visits, patients may have 1 support person aged 89 or older with them. For treatment visits, patients cannot have anyone with them due to current Covid guidelines and our immunocompromised population.   Daratumumab injection What is this medicine? DARATUMUMAB (dar a toom ue mab) is a monoclonal antibody. It is used to treat multiple myeloma. This medicine may be used for other purposes; ask your health care provider or pharmacist if you have questions. COMMON BRAND NAME(S): DARZALEX What  should I tell my health care provider before I take this medicine? They need to know if you have any of these conditions: hereditary fructose intolerance infection (especially a virus infection such as chickenpox, herpes, or hepatitis B virus) lung or breathing disease (asthma, COPD) an unusual or allergic  reaction to daratumumab, sorbitol, other medicines, foods, dyes, or preservatives pregnant or trying to get pregnant breast-feeding How should I use this medicine? This medicine is for infusion into a vein. It is given by a health care professional in a hospital or clinic setting. Talk to your pediatrician regarding the use of this medicine in children. Special care may be needed. Overdosage: If you think you have taken too much of this medicine contact a poison control center or emergency room at once. NOTE: This medicine is only for you. Do not share this medicine with others. What if I miss a dose? Keep appointments for follow-up doses as directed. It is important not to miss your dose. Call your doctor or health care professional if you are unable to keep an appointment. What may interact with this medicine? Interactions have not been studied. This list may not describe all possible interactions. Give your health care provider a list of all the medicines, herbs, non-prescription drugs, or dietary supplements you use. Also tell them if you smoke, drink alcohol, or use illegal drugs. Some items may interact with your medicine. What should I watch for while using this medicine? Your condition will be monitored carefully while you are receiving this medicine. This medicine can cause serious allergic reactions. To reduce your risk, your health care provider may give you other medicine to take before receiving this one. Be sure to follow the directions from your health care provider. This medicine can affect the results of blood tests to match your blood type. These changes can last for up to 6 months after the final dose. Your healthcare provider will do blood tests to match your blood type before you start treatment. Tell all of your healthcare providers that you are being treated with this medicine before receiving a blood transfusion. This medicine can affect the results of some tests used to  determine treatment response; extra tests may be needed to evaluate response. Do not become pregnant while taking this medicine or for 3 months after stopping it. Women should inform their health care provider if they wish to become pregnant or think they might be pregnant. There is a potential for serious side effects to an unborn child. Talk to your health care provider for more information. Do not breast-feed an infant while taking this medicine. What side effects may I notice from receiving this medicine? Side effects that you should report to your doctor or health care professional as soon as possible: allergic reactions (skin rash; itching or hives; swelling of the face, lips, or tongue) infection (fever, chills, cough, sore throat, pain or difficulty passing urine) infusion reaction (dizziness, fast heartbeat, feeling faint or lightheaded, falls, headache, increase in blood pressure, nausea, vomiting, or wheezing or trouble breathing with loud or whistling sounds) unusual bleeding or bruising Side effects that usually do not require medical attention (report to your doctor or health care professional if they continue or are bothersome): constipation diarrhea pain, tingling, numbness in the hands or feet swelling of the ankles, feet, hands tiredness This list may not describe all possible side effects. Call your doctor for medical advice about side effects. You may report side effects to FDA at 1-800-FDA-1088. Where should  I keep my medicine? This drug is given in a hospital or clinic and will not be stored at home. NOTE: This sheet is a summary. It may not cover all possible information. If you have questions about this medicine, talk to your doctor, pharmacist, or health care provider.  2021 Elsevier/Gold Standard (2020-06-11 13:28:52)  Bortezomib injection What is this medicine? BORTEZOMIB (bor TEZ oh mib) targets proteins in cancer cells and stops the cancer cells from growing. It  treats multiple myeloma and mantle cell lymphoma. This medicine may be used for other purposes; ask your health care provider or pharmacist if you have questions. COMMON BRAND NAME(S): Velcade What should I tell my health care provider before I take this medicine? They need to know if you have any of these conditions: dehydration diabetes (high blood sugar) heart disease liver disease tingling of the fingers or toes or other nerve disorder an unusual or allergic reaction to bortezomib, mannitol, boron, other medicines, foods, dyes, or preservatives pregnant or trying to get pregnant breast-feeding How should I use this medicine? This medicine is injected into a vein or under the skin. It is given by a health care provider in a hospital or clinic setting. Talk to your health care provider about the use of this medicine in children. Special care may be needed. Overdosage: If you think you have taken too much of this medicine contact a poison control center or emergency room at once. NOTE: This medicine is only for you. Do not share this medicine with others. What if I miss a dose? Keep appointments for follow-up doses. It is important not to miss your dose. Call your health care provider if you are unable to keep an appointment. What may interact with this medicine? This medicine may interact with the following medications: ketoconazole rifampin This list may not describe all possible interactions. Give your health care provider a list of all the medicines, herbs, non-prescription drugs, or dietary supplements you use. Also tell them if you smoke, drink alcohol, or use illegal drugs. Some items may interact with your medicine. What should I watch for while using this medicine? Your condition will be monitored carefully while you are receiving this medicine. You may need blood work done while you are taking this medicine. You may get drowsy or dizzy. Do not drive, use machinery, or do  anything that needs mental alertness until you know how this medicine affects you. Do not stand up or sit up quickly, especially if you are an older patient. This reduces the risk of dizzy or fainting spells This medicine may increase your risk of getting an infection. Call your health care provider for advice if you get a fever, chills, sore throat, or other symptoms of a cold or flu. Do not treat yourself. Try to avoid being around people who are sick. Check with your health care provider if you have severe diarrhea, nausea, and vomiting, or if you sweat a lot. The loss of too much body fluid may make it dangerous for you to take this medicine. Do not become pregnant while taking this medicine or for 7 months after stopping it. Women should inform their health care provider if they wish to become pregnant or think they might be pregnant. Men should not father a child while taking this medicine and for 4 months after stopping it. There is a potential for serious harm to an unborn child. Talk to your health care provider for more information. Do not breast-feed an infant while  taking this medicine or for 2 months after stopping it. This medicine may make it more difficult to get pregnant or father a child. Talk to your health care provider if you are concerned about your fertility. What side effects may I notice from receiving this medicine? Side effects that you should report to your doctor or health care professional as soon as possible: allergic reactions (skin rash; itching or hives; swelling of the face, lips, or tongue) bleeding (bloody or black, tarry stools; red or dark brown urine; spitting up blood or brown material that looks like coffee grounds; red spots on the skin; unusual bruising or bleeding from the eye, gums, or nose) blurred vision or changes in vision confusion constipation headache heart failure (trouble breathing; fast, irregular heartbeat; sudden weight gain; swelling of the  ankles, feet, hands) infection (fever, chills, cough, sore throat, pain or trouble passing urine) lack or loss of appetite liver injury (dark yellow or brown urine; general ill feeling or flu-like symptoms; loss of appetite, right upper belly pain; yellowing of the eyes or skin) low blood pressure (dizziness; feeling faint or lightheaded, falls; unusually weak or tired) muscle cramps pain, redness, or irritation at site where injected pain, tingling, numbness in the hands or feet seizures trouble breathing unusual bruising or bleeding Side effects that usually do not require medical attention (report to your doctor or health care professional if they continue or are bothersome): diarrhea nausea stomach pain trouble sleeping vomiting This list may not describe all possible side effects. Call your doctor for medical advice about side effects. You may report side effects to FDA at 1-800-FDA-1088. Where should I keep my medicine? This medicine is given in a hospital or clinic. It will not be stored at home. NOTE: This sheet is a summary. It may not cover all possible information. If you have questions about this medicine, talk to your doctor, pharmacist, or health care provider.  2021 Elsevier/Gold Standard (2020-06-11 13:22:53)

## 2020-12-19 ENCOUNTER — Other Ambulatory Visit: Payer: Self-pay | Admitting: Oncology

## 2020-12-21 ENCOUNTER — Other Ambulatory Visit: Payer: Self-pay

## 2020-12-21 ENCOUNTER — Inpatient Hospital Stay: Payer: 59

## 2020-12-21 ENCOUNTER — Inpatient Hospital Stay
Admission: RE | Admit: 2020-12-21 | Discharge: 2020-12-21 | Disposition: A | Discharge status: Home / Self Care | Payer: 59 | Source: Ambulatory Visit | Attending: Oncology | Admitting: Oncology

## 2020-12-21 ENCOUNTER — Other Ambulatory Visit: Payer: Self-pay | Admitting: *Deleted

## 2020-12-21 ENCOUNTER — Inpatient Hospital Stay (HOSPITAL_BASED_OUTPATIENT_CLINIC_OR_DEPARTMENT_OTHER): Payer: 59 | Admitting: Oncology

## 2020-12-21 VITALS — BP 133/82 | HR 58 | Temp 97.8°F | Resp 18 | Ht 65.0 in | Wt 162.6 lb

## 2020-12-21 DIAGNOSIS — C9000 Multiple myeloma not having achieved remission: Secondary | ICD-10-CM

## 2020-12-21 DIAGNOSIS — Z5112 Encounter for antineoplastic immunotherapy: Secondary | ICD-10-CM | POA: Diagnosis not present

## 2020-12-21 LAB — CBC WITH DIFFERENTIAL (CANCER CENTER ONLY)
Abs Immature Granulocytes: 0.02 10*3/uL (ref 0.00–0.07)
Basophils Absolute: 0.1 10*3/uL (ref 0.0–0.1)
Basophils Relative: 1 %
Eosinophils Absolute: 0.1 10*3/uL (ref 0.0–0.5)
Eosinophils Relative: 1 %
HCT: 44.5 % (ref 36.0–46.0)
Hemoglobin: 14.8 g/dL (ref 12.0–15.0)
Immature Granulocytes: 0 %
Lymphocytes Relative: 13 %
Lymphs Abs: 0.8 10*3/uL (ref 0.7–4.0)
MCH: 30.5 pg (ref 26.0–34.0)
MCHC: 33.3 g/dL (ref 30.0–36.0)
MCV: 91.8 fL (ref 80.0–100.0)
Monocytes Absolute: 0.8 10*3/uL (ref 0.1–1.0)
Monocytes Relative: 14 %
Neutro Abs: 4.2 10*3/uL (ref 1.7–7.7)
Neutrophils Relative %: 71 %
Platelet Count: 242 10*3/uL (ref 150–400)
RBC: 4.85 MIL/uL (ref 3.87–5.11)
RDW: 13.8 % (ref 11.5–15.5)
WBC Count: 5.9 10*3/uL (ref 4.0–10.5)
nRBC: 0 % (ref 0.0–0.2)

## 2020-12-21 MED ORDER — LENALIDOMIDE 25 MG PO CAPS: 25.0000 mg | ORAL_CAPSULE | Freq: Every day | ORAL | 0 refills | Status: DC

## 2020-12-21 MED ORDER — DEXAMETHASONE 4 MG PO TABS
20.0000 mg | ORAL_TABLET | Freq: Once | ORAL | Status: AC
Start: 2020-12-21 — End: 2020-12-21
  Administered 2020-12-21: 20 mg via ORAL
  Filled 2020-12-21: qty 5

## 2020-12-21 MED ORDER — BORTEZOMIB CHEMO SQ INJECTION 3.5 MG (2.5MG/ML)
1.0000 mg/m2 | Freq: Once | INTRAMUSCULAR | Status: AC
  Administered 2020-12-21: 1.75 mg via SUBCUTANEOUS
  Filled 2020-12-21: qty 0.7

## 2020-12-21 NOTE — Progress Notes (Signed)
[facility] OFFICE PROGRESS NOTE   Diagnosis: Multiple myeloma  INTERVAL HISTORY:   Katherine Beard presents for an unscheduled visit.  She continues treatment with VRD.  She completed another cycle of Revlimid yesterday.  She has again developed "styes "in both eyes.  A lesion in the right eye is improving and there is a new stye at the left lower eyelid.  She uses warm compresses.  Objective:  Vital signs in last 24 hours:  Blood pressure 133/82, pulse (!) 58, temperature 97.8 F (36.6 C), temperature source Oral, resp. rate 18, height _0  (1.651 m), weight 162 lb 9.6 oz (73.8 kg), last menstrual period 03/06/2011, SpO2 100 %.    HEENT: No thrush or ulcers, bilateral conjunctival erythema with a resolving stye at the medial right lower lid and a stye at the medial left lower lid Resp: Lungs clear bilaterally Cardio: Regular rate and rhythm Vascular: No leg edema   Lab Results:  Lab Results  Component Value Date   WBC 5.9 12/21/2020   HGB 14.8 12/21/2020   HCT 44.5 12/21/2020   MCV 91.8 12/21/2020   PLT 242 12/21/2020   NEUTROABS 4.2 12/21/2020    CMP  Lab Results  Component Value Date   NA 138 12/14/2020   K 4.1 12/14/2020   CL 103 12/14/2020   CO2 25 12/14/2020   GLUCOSE 110 (H) 12/14/2020   BUN 17 12/14/2020   CREATININE 0.73 12/14/2020   CALCIUM 9.4 12/14/2020   PROT 6.2 (L) 12/14/2020   ALBUMIN 4.1 12/14/2020   AST 10 (L) 12/14/2020   ALT 10 12/14/2020   ALKPHOS 43 12/14/2020   BILITOT 0.4 12/14/2020   GFRNONAA >60 12/14/2020   GFRAA 78 12/11/2006     Medications: I have reviewed the patient's current medications.   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 Increase serum free lambda light chains Serum M spike MRI pelvis 09/01/2020-innumerable small foci of signal  abnormality concerning for multiple myeloma involvement Metastatic bone survey 09/03/2020-no discrete lytic bone lesions, compression fractures at T12, L1, L2, and L3, osteopenia Cycle 1 RVD 09/14/2020 Daratumumab beginning 09/28/2020 Cycle 2 RVD 10/05/2020 plus daratumumab Week 3 daratumumab 10/12/2020 Cycle 3 RVD plus daratumumab 10/26/2020 Cycle 4 RVD plus daratumumab 11/16/2020, daratumumab changed to an every 2-week schedule beginning 11/23/2020 Cycle 5 RVD plus daratumumab 12/07/2020   2. Severe back pain MRI lumbar spine July 12, 2020-subacute compression fractures at T12 and L1 MRI lumbar spine July 30, 2020-subacute T12 and L1 compression fractures, new subacute superior L2 endplate deformity MRI lumbar spine September 01, 2020 new/progressive compression fractures at L2 and L3, acute compression fracture at L4, evolution of T12 and L1 compression fractures MRI pelvis September 01, 2020-innumerable small foci of signal abnormality in the pelvis and femurs, no fracture identified, concerning for multiple myeloma versus metastases Zometa beginning 09/21/2020-plan monthly x3 then every 3 months, next due August 2022 3. Melanoma left upper back 2014 4. History of diverticulitis 5. Mild hypercalcemia at presentation-resolved 6. Osteoporosis 7. Evusheld    Disposition: [name] appears stable.  She is tolerating the RVD well aside from the development of styes.  [facility].  She will begin a trial of doxycycline.  The plan is to continue Velcade.  She will return for an office visit prior to another cycle of RVD on 12/28/2020.  She is scheduled for appointment at [facility] over the next month to decide on stem cell therapy versus beginning a maintenance regimen.  [name], MD  12/21/2020  11:08 AM

## 2020-12-21 NOTE — Telephone Encounter (Signed)
Patient reports another stye in eye that is painful. Asking about dose reduction of Velcade. Per Dr. Benay Spice: Will see her today prior to treatment. Called her home and left VM requesting her to come as soon as she is able.

## 2020-12-21 NOTE — Patient Instructions (Signed)
[facility]  Discharge Instructions: Thank you for choosing [facility] to provide your oncology and hematology care.   If you have a lab appointment with the [facility], please go directly to the [facility] and check in at the registration area.   Wear comfortable clothing and clothing appropriate for easy access to any Portacath or PICC line.   We strive to give you quality time with your provider. You may need to reschedule your appointment if you arrive late (15 or more minutes).  Arriving late affects you and other patients whose appointments are after yours.  Also, if you miss three or more appointments without notifying the office, you may be dismissed from the clinic at the provider's discretion.      For prescription refill requests, have your pharmacy contact our office and allow 72 hours for refills to be completed.    Today you received the following chemotherapy and/or immunotherapy agents Velcade and Decadron      To help prevent nausea and vomiting after your treatment, we encourage you to take your nausea medication as directed.  BELOW ARE SYMPTOMS THAT SHOULD BE REPORTED IMMEDIATELY: *FEVER GREATER THAN 100.4 F (38 C) OR HIGHER *CHILLS OR SWEATING *NAUSEA AND VOMITING THAT IS NOT CONTROLLED WITH YOUR NAUSEA MEDICATION *UNUSUAL SHORTNESS OF BREATH *UNUSUAL BRUISING OR BLEEDING *URINARY PROBLEMS (pain or burning when urinating, or frequent urination) *BOWEL PROBLEMS (unusual diarrhea, constipation, pain near the anus) TENDERNESS IN MOUTH AND THROAT WITH OR WITHOUT PRESENCE OF ULCERS (sore throat, sores in mouth, or a toothache) UNUSUAL RASH, SWELLING OR PAIN  UNUSUAL VAGINAL DISCHARGE OR ITCHING   Items with * indicate a potential emergency and should be followed up as soon as possible or go to the Emergency Department if any problems should occur.  Please show the CHEMOTHERAPY ALERT CARD or IMMUNOTHERAPY ALERT CARD at  check-in to the Emergency Department and triage nurse.  Should you have questions after your visit or need to cancel or reschedule your appointment, please contact [facility]  Dept: [phone]  and follow the prompts.  Office hours are 8:00 a.m. to 4:30 p.m. Monday - Friday. Please note that voicemails left after 4:00 p.m. may not be returned until the following business day.  We are closed weekends and major holidays. You have access to a nurse at all times for urgent questions. Please call the main number to the clinic Dept: [phone] and follow the prompts.   For any non-urgent questions, you may also contact your provider using MyChart. We now offer e-Visits for anyone 74 and older to request care online for non-urgent symptoms. For details visit [email or web address].   Also download the MyChart app! Go to the app store, search "MyChart", open the app, select [facility], and log in with your MyChart username and password.  Due to Covid, a mask is required upon entering the hospital/clinic. If you do not have a mask, one will be given to you upon arrival. For doctor visits, patients may have 1 support person aged 25 or older with them. For treatment visits, patients cannot have anyone with them due to current Covid guidelines and our immunocompromised population.

## 2020-12-22 ENCOUNTER — Telehealth: Payer: Self-pay

## 2020-12-22 MED ORDER — DOXYCYCLINE HYCLATE 100 MG PO TABS: 100.0000 mg | ORAL_TABLET | Freq: Two times a day (BID) | ORAL | 0 refills | Status: DC

## 2020-12-22 NOTE — Telephone Encounter (Signed)
TC to Pt to inform her that Dr Benay Spice is prescribing doxycyline for her styes. Pt informed me that the styes are gone today. She did warm compress and an eye drop and she is happy with that for now. She stated for me to call in prescription and if they should re appear she will pick up the prescription from the pharmacy. Medication called in to pharmacy. No further problems or concerns noted.

## 2020-12-27 ENCOUNTER — Other Ambulatory Visit: Payer: Self-pay | Admitting: Oncology

## 2020-12-28 ENCOUNTER — Inpatient Hospital Stay: Payer: 59

## 2020-12-28 ENCOUNTER — Other Ambulatory Visit: Payer: Self-pay

## 2020-12-28 ENCOUNTER — Other Ambulatory Visit (HOSPITAL_BASED_OUTPATIENT_CLINIC_OR_DEPARTMENT_OTHER): Payer: 59

## 2020-12-28 VITALS — BP 134/85 | HR 55 | Temp 98.2°F | Resp 18 | Wt 168.4 lb

## 2020-12-28 DIAGNOSIS — C9000 Multiple myeloma not having achieved remission: Secondary | ICD-10-CM

## 2020-12-28 DIAGNOSIS — Z5112 Encounter for antineoplastic immunotherapy: Secondary | ICD-10-CM | POA: Diagnosis not present

## 2020-12-28 LAB — CBC WITH DIFFERENTIAL (CANCER CENTER ONLY)
Abs Immature Granulocytes: 0.01 10*3/uL (ref 0.00–0.07)
Basophils Absolute: 0.1 10*3/uL (ref 0.0–0.1)
Basophils Relative: 1 %
Eosinophils Absolute: 0 10*3/uL (ref 0.0–0.5)
Eosinophils Relative: 1 %
HCT: 39.4 % (ref 36.0–46.0)
Hemoglobin: 13.6 g/dL (ref 12.0–15.0)
Immature Granulocytes: 0 %
Lymphocytes Relative: 20 %
Lymphs Abs: 0.9 10*3/uL (ref 0.7–4.0)
MCH: 30.7 pg (ref 26.0–34.0)
MCHC: 34.5 g/dL (ref 30.0–36.0)
MCV: 88.9 fL (ref 80.0–100.0)
Monocytes Absolute: 0.6 10*3/uL (ref 0.1–1.0)
Monocytes Relative: 12 %
Neutro Abs: 3.2 10*3/uL (ref 1.7–7.7)
Neutrophils Relative %: 66 %
Platelet Count: 205 10*3/uL (ref 150–400)
RBC: 4.43 MIL/uL (ref 3.87–5.11)
RDW: 14 % (ref 11.5–15.5)
WBC Count: 4.8 10*3/uL (ref 4.0–10.5)
nRBC: 0 % (ref 0.0–0.2)

## 2020-12-28 MED ORDER — ACETAMINOPHEN 325 MG PO TABS
650.0000 mg | ORAL_TABLET | Freq: Once | ORAL | Status: AC
Start: 2020-12-28 — End: 2020-12-28
  Administered 2020-12-28: 650 mg via ORAL
  Filled 2020-12-28: qty 2

## 2020-12-28 MED ORDER — DARATUMUMAB-HYALURONIDASE-FIHJ 1800-30000 MG-UT/15ML ~~LOC~~ SOLN
1800.0000 mg | Freq: Once | SUBCUTANEOUS | Status: AC
  Administered 2020-12-28: 1800 mg via SUBCUTANEOUS
  Filled 2020-12-28: qty 15

## 2020-12-28 MED ORDER — BORTEZOMIB CHEMO SQ INJECTION 3.5 MG (2.5MG/ML)
1.0000 mg/m2 | Freq: Once | INTRAMUSCULAR | Status: AC
  Administered 2020-12-28: 1.75 mg via SUBCUTANEOUS
  Filled 2020-12-28: qty 0.7

## 2020-12-28 MED ORDER — DIPHENHYDRAMINE HCL 25 MG PO CAPS
50.0000 mg | ORAL_CAPSULE | Freq: Once | ORAL | Status: AC
  Administered 2020-12-28: 50 mg via ORAL
  Filled 2020-12-28: qty 2

## 2020-12-28 MED ORDER — DEXAMETHASONE 4 MG PO TABS
20.0000 mg | ORAL_TABLET | Freq: Once | ORAL | Status: AC
  Administered 2020-12-28: 20 mg via ORAL
  Filled 2020-12-28: qty 5

## 2020-12-28 NOTE — Patient Instructions (Signed)
[facility]  Discharge Instructions: Thank you for choosing [facility] to provide your oncology and hematology care.   If you have a lab appointment with the [facility], please go directly to the [facility] and check in at the registration area.   Wear comfortable clothing and clothing appropriate for easy access to any Portacath or PICC line.   We strive to give you quality time with your provider. You may need to reschedule your appointment if you arrive late (15 or more minutes).  Arriving late affects you and other patients whose appointments are after yours.  Also, if you miss three or more appointments without notifying the office, you may be dismissed from the clinic at the provider's discretion.      For prescription refill requests, have your pharmacy contact our office and allow 72 hours for refills to be completed.    Today you received the following chemotherapy and/or immunotherapy agents velcade, darzalex faspro     To help prevent nausea and vomiting after your treatment, we encourage you to take your nausea medication as directed.  BELOW ARE SYMPTOMS THAT SHOULD BE REPORTED IMMEDIATELY: *FEVER GREATER THAN 100.4 F (38 C) OR HIGHER *CHILLS OR SWEATING *NAUSEA AND VOMITING THAT IS NOT CONTROLLED WITH YOUR NAUSEA MEDICATION *UNUSUAL SHORTNESS OF BREATH *UNUSUAL BRUISING OR BLEEDING *URINARY PROBLEMS (pain or burning when urinating, or frequent urination) *BOWEL PROBLEMS (unusual diarrhea, constipation, pain near the anus) TENDERNESS IN MOUTH AND THROAT WITH OR WITHOUT PRESENCE OF ULCERS (sore throat, sores in mouth, or a toothache) UNUSUAL RASH, SWELLING OR PAIN  UNUSUAL VAGINAL DISCHARGE OR ITCHING   Items with * indicate a potential emergency and should be followed up as soon as possible or go to the Emergency Department if any problems should occur.  Please show the CHEMOTHERAPY ALERT CARD or IMMUNOTHERAPY ALERT CARD at  check-in to the Emergency Department and triage nurse.  Should you have questions after your visit or need to cancel or reschedule your appointment, please contact [facility]  Dept: [phone]  and follow the prompts.  Office hours are 8:00 a.m. to 4:30 p.m. Monday - Friday. Please note that voicemails left after 4:00 p.m. may not be returned until the following business day.  We are closed weekends and major holidays. You have access to a nurse at all times for urgent questions. Please call the main number to the clinic Dept: [phone] and follow the prompts.   For any non-urgent questions, you may also contact your provider using MyChart. We now offer e-Visits for anyone 53 and older to request care online for non-urgent symptoms. For details visit [email or web address].   Also download the MyChart app! Go to the app store, search "MyChart", open the app, select [facility], and log in with your MyChart username and password.  Due to Covid, a mask is required upon entering the hospital/clinic. If you do not have a mask, one will be given to you upon arrival. For doctor visits, patients may have 1 support person aged 48 or older with them. For treatment visits, patients cannot have anyone with them due to current Covid guidelines and our immunocompromised population.    Bortezomib injection What is this medication? BORTEZOMIB (bor TEZ oh mib) targets proteins in cancer cells and stops thecancer cells from growing. It treats multiple myeloma and mantle cell lymphoma. This medicine may be used for other purposes; ask your health care provider orpharmacist if you have  questions. COMMON BRAND NAME(S): Velcade What should I tell my care team before I take this medication? They need to know if you have any of these conditions: dehydration diabetes (high blood sugar) heart disease liver disease tingling of the fingers or toes or other nerve disorder an unusual or  allergic reaction to bortezomib, mannitol, boron, other medicines, foods, dyes, or preservatives pregnant or trying to get pregnant breast-feeding How should I use this medication? This medicine is injected into a vein or under the skin. It is given by ahealth care provider in a hospital or clinic setting. Talk to your health care provider about the use of this medicine in children.Special care may be needed. Overdosage: If you think you have taken too much of this medicine contact apoison control center or emergency room at once. NOTE: This medicine is only for you. Do not share this medicine with others. What if I miss a dose? Keep appointments for follow-up doses. It is important not to miss your dose.Call your health care provider if you are unable to keep an appointment. What may interact with this medication? This medicine may interact with the following medications: ketoconazole rifampin This list may not describe all possible interactions. Give your health care provider a list of all the medicines, herbs, non-prescription drugs, or dietary supplements you use. Also tell them if you smoke, drink alcohol, or use illegaldrugs. Some items may interact with your medicine. What should I watch for while using this medication? Your condition will be monitored carefully while you are receiving thismedicine. You may need blood work done while you are taking this medicine. You may get drowsy or dizzy. Do not drive, use machinery, or do anything that needs mental alertness until you know how this medicine affects you. Do not stand up or sit up quickly, especially if you are an older patient. Thisreduces the risk of dizzy or fainting spells This medicine may increase your risk of getting an infection. Call your health care provider for advice if you get a fever, chills, sore throat, or other symptoms of a cold or flu. Do not treat yourself. Try to avoid being aroundpeople who are sick. Check with your  health care provider if you have severe diarrhea, nausea, and vomiting, or if you sweat a lot. The loss of too much body fluid may make itdangerous for you to take this medicine. Do not become pregnant while taking this medicine or for 7 months after stopping it. Women should inform their health care provider if they wish to become pregnant or think they might be pregnant. Men should not father a child while taking this medicine and for 4 months after stopping it. There is a potential for serious harm to an unborn child. Talk to your health care provider for more information. Do not breast-feed an infant while taking thismedicine or for 2 months after stopping it. This medicine may make it more difficult to get pregnant or father a child.Talk to your health care provider if you are concerned about your fertility. What side effects may I notice from receiving this medication? Side effects that you should report to your doctor or health care professionalas soon as possible: allergic reactions (skin rash; itching or hives; swelling of the face, lips, or tongue) bleeding (bloody or black, tarry stools; red or dark brown urine; spitting up blood or brown material that looks like coffee grounds; red spots on the skin; unusual bruising or bleeding from the eye, gums, or nose) blurred vision or   changes in vision confusion constipation headache heart failure (trouble breathing; fast, irregular heartbeat; sudden weight gain; swelling of the ankles, feet, hands) infection (fever, chills, cough, sore throat, pain or trouble passing urine) lack or loss of appetite liver injury (dark yellow or brown urine; general ill feeling or flu-like symptoms; loss of appetite, right upper belly pain; yellowing of the eyes or skin) low blood pressure (dizziness; feeling faint or lightheaded, falls; unusually weak or tired) muscle cramps pain, redness, or irritation at site where injected pain, tingling, numbness in the hands  or feet seizures trouble breathing unusual bruising or bleeding Side effects that usually do not require medical attention (report to yourdoctor or health care professional if they continue or are bothersome): diarrhea nausea stomach pain trouble sleeping vomiting This list may not describe all possible side effects. Call your doctor for medical advice about side effects. You may report side effects to FDA at1-800-FDA-1088. Where should I keep my medication? This medicine is given in a hospital or clinic. It will not be stored at home. NOTE: This sheet is a summary. It may not cover all possible information. If you have questions about this medicine, talk to your doctor, pharmacist, orhealth care provider.  2022 Elsevier/Gold Standard (2020-06-11 13:22:53)  Daratumumab injection What is this medication? DARATUMUMAB (dar a toom ue mab) is a monoclonal antibody. It is used to treatmultiple myeloma. This medicine may be used for other purposes; ask your health care provider orpharmacist if you have questions. COMMON BRAND NAME(S): DARZALEX What should I tell my care team before I take this medication? They need to know if you have any of these conditions: hereditary fructose intolerance infection (especially a virus infection such as chickenpox, herpes, or hepatitis B virus) lung or breathing disease (asthma, COPD) an unusual or allergic reaction to daratumumab, sorbitol, other medicines, foods, dyes, or preservatives pregnant or trying to get pregnant breast-feeding How should I use this medication? This medicine is for infusion into a vein. It is given by a health careprofessional in a hospital or clinic setting. Talk to your pediatrician regarding the use of this medicine in children.Special care may be needed. Overdosage: If you think you have taken too much of this medicine contact apoison control center or emergency room at once. NOTE: This medicine is only for you. Do not share  this medicine with others. What if I miss a dose? Keep appointments for follow-up doses as directed. It is important not to miss your dose. Call your doctor or health care professional if you are unable tokeep an appointment. What may interact with this medication? Interactions have not been studied. This list may not describe all possible interactions. Give your health care provider a list of all the medicines, herbs, non-prescription drugs, or dietary supplements you use. Also tell them if you smoke, drink alcohol, or use illegaldrugs. Some items may interact with your medicine. What should I watch for while using this medication? Your condition will be monitored carefully while you are receiving thismedicine. This medicine can cause serious allergic reactions. To reduce your risk, your health care provider may give you other medicine to take before receiving thisone. Be sure to follow the directions from your health care provider. This medicine can affect the results of blood tests to match your blood type. These changes can last for up to 6 months after the final dose. Your healthcare provider will do blood tests to match your blood type before you start treatment. Tell all of your healthcare providers  that you are being treatedwith this medicine before receiving a blood transfusion. This medicine can affect the results of some tests used to determine treatmentresponse; extra tests may be needed to evaluate response. Do not become pregnant while taking this medicine or for 3 months after stopping it. Women should inform their health care provider if they wish to become pregnant or think they might be pregnant. There is a potential for serious side effects to an unborn child. Talk to your health care provider formore information. Do not breast-feed an infant while taking this medicine. What side effects may I notice from receiving this medication? Side effects that you should report to your doctor or  health care professionalas soon as possible: allergic reactions (skin rash, itching, hives, swelling of the face, lips, or tongue) blurred vision infection (fever, chills, cough, sore throat, pain or difficulty passing urine) infusion reaction (dizziness, fast heartbeat, feeling faint or lightheaded, falls, headache, increase in blood pressure, nausea, vomiting, or wheezing or trouble breathing with loud or whistling sounds) unusual bleeding or bruising Side effects that usually do not require medical attention (report to yourdoctor or health care professional if they continue or are bothersome): constipation diarrhea pain, tingling, numbness in the hands or feet swelling of the ankles, feet, hands tiredness This list may not describe all possible side effects. Call your doctor for medical advice about side effects. You may report side effects to FDA at1-800-FDA-1088. Where should I keep my medication? This drug is given in a hospital or clinic and will not be stored at home. NOTE: This sheet is a summary. It may not cover all possible information. If you have questions about this medicine, talk to your doctor, pharmacist, orhealth care provider.  2022 Elsevier/Gold Standard (2020-07-30 12:50:38)

## 2021-01-04 ENCOUNTER — Other Ambulatory Visit: Payer: Self-pay | Admitting: Oncology

## 2021-01-05 ENCOUNTER — Encounter: Payer: Self-pay | Admitting: Nurse Practitioner

## 2021-01-05 ENCOUNTER — Other Ambulatory Visit (HOSPITAL_BASED_OUTPATIENT_CLINIC_OR_DEPARTMENT_OTHER): Payer: 59

## 2021-01-05 ENCOUNTER — Inpatient Hospital Stay: Payer: 59

## 2021-01-05 ENCOUNTER — Inpatient Hospital Stay: Payer: 59 | Attending: Oncology

## 2021-01-05 ENCOUNTER — Encounter: Payer: Self-pay | Admitting: General Practice

## 2021-01-05 ENCOUNTER — Other Ambulatory Visit: Payer: Self-pay

## 2021-01-05 DIAGNOSIS — Z5112 Encounter for antineoplastic immunotherapy: Secondary | ICD-10-CM | POA: Insufficient documentation

## 2021-01-05 DIAGNOSIS — C9000 Multiple myeloma not having achieved remission: Secondary | ICD-10-CM | POA: Diagnosis present

## 2021-01-05 DIAGNOSIS — Z79899 Other long term (current) drug therapy: Secondary | ICD-10-CM | POA: Insufficient documentation

## 2021-01-05 LAB — CBC WITH DIFFERENTIAL (CANCER CENTER ONLY)
Abs Immature Granulocytes: 0.01 10*3/uL (ref 0.00–0.07)
Basophils Absolute: 0 10*3/uL (ref 0.0–0.1)
Basophils Relative: 1 %
Eosinophils Absolute: 0.1 10*3/uL (ref 0.0–0.5)
Eosinophils Relative: 2 %
HCT: 45.1 % (ref 36.0–46.0)
Hemoglobin: 15.1 g/dL — ABNORMAL HIGH (ref 12.0–15.0)
Immature Granulocytes: 0 %
Lymphocytes Relative: 14 %
Lymphs Abs: 0.6 10*3/uL — ABNORMAL LOW (ref 0.7–4.0)
MCH: 30.3 pg (ref 26.0–34.0)
MCHC: 33.5 g/dL (ref 30.0–36.0)
MCV: 90.4 fL (ref 80.0–100.0)
Monocytes Absolute: 0.3 10*3/uL (ref 0.1–1.0)
Monocytes Relative: 6 %
Neutro Abs: 3.5 10*3/uL (ref 1.7–7.7)
Neutrophils Relative %: 77 %
Platelet Count: 236 10*3/uL (ref 150–400)
RBC: 4.99 MIL/uL (ref 3.87–5.11)
RDW: 14.5 % (ref 11.5–15.5)
WBC Count: 4.5 10*3/uL (ref 4.0–10.5)
nRBC: 0 % (ref 0.0–0.2)

## 2021-01-05 LAB — CMP (CANCER CENTER ONLY)
ALT: 12 U/L (ref 0–44)
AST: 11 U/L — ABNORMAL LOW (ref 15–41)
Albumin: 4.3 g/dL (ref 3.5–5.0)
Alkaline Phosphatase: 39 U/L (ref 38–126)
Anion gap: 9 (ref 5–15)
BUN: 17 mg/dL (ref 6–20)
CO2: 25 mmol/L (ref 22–32)
Calcium: 9.9 mg/dL (ref 8.9–10.3)
Chloride: 103 mmol/L (ref 98–111)
Creatinine: 0.68 mg/dL (ref 0.44–1.00)
GFR, Estimated: >60 mL/min (ref >60)
Glucose, Bld: 93 mg/dL (ref 70–99)
Potassium: 4.2 mmol/L (ref 3.5–5.1)
Sodium: 137 mmol/L (ref 135–145)
Total Bilirubin: 0.5 mg/dL (ref 0.3–1.2)
Total Protein: 6.6 g/dL (ref 6.5–8.1)

## 2021-01-05 MED ORDER — BORTEZOMIB CHEMO SQ INJECTION 3.5 MG (2.5MG/ML)
1.0000 mg/m2 | Freq: Once | INTRAMUSCULAR | Status: AC
  Administered 2021-01-05: 1.75 mg via SUBCUTANEOUS
  Filled 2021-01-05: qty 0.7

## 2021-01-05 MED ORDER — DEXAMETHASONE 4 MG PO TABS
20.0000 mg | ORAL_TABLET | Freq: Once | ORAL | Status: AC
  Administered 2021-01-05: 20 mg via ORAL
  Filled 2021-01-05: qty 5

## 2021-01-05 NOTE — Patient Instructions (Signed)
[facility]   Discharge Instructions: Thank you for choosing [facility] to provide your oncology and hematology care.   If you have a lab appointment with the Cancer Center, please go directly to the Cancer Center and check in at the registration area.   Wear comfortable clothing and clothing appropriate for easy access to any Portacath or PICC line.   We strive to give you quality time with your provider. You may need to reschedule your appointment if you arrive late (15 or more minutes).  Arriving late affects you and other patients whose appointments are after yours.  Also, if you miss three or more appointments without notifying the office, you may be dismissed from the clinic at the provider's discretion.      For prescription refill requests, have your pharmacy contact our office and allow 72 hours for refills to be completed.    Today you received the following chemotherapy and/or immunotherapy agents Velcade      To help prevent nausea and vomiting after your treatment, we encourage you to take your nausea medication as directed.  BELOW ARE SYMPTOMS THAT SHOULD BE REPORTED IMMEDIATELY: *FEVER GREATER THAN 100.4 F (38 C) OR HIGHER *CHILLS OR SWEATING *NAUSEA AND VOMITING THAT IS NOT CONTROLLED WITH YOUR NAUSEA MEDICATION *UNUSUAL SHORTNESS OF BREATH *UNUSUAL BRUISING OR BLEEDING *URINARY PROBLEMS (pain or burning when urinating, or frequent urination) *BOWEL PROBLEMS (unusual diarrhea, constipation, pain near the anus) TENDERNESS IN MOUTH AND THROAT WITH OR WITHOUT PRESENCE OF ULCERS (sore throat, sores in mouth, or a toothache) UNUSUAL RASH, SWELLING OR PAIN  UNUSUAL VAGINAL DISCHARGE OR ITCHING   Items with * indicate a potential emergency and should be followed up as soon as possible or go to the Emergency Department if any problems should occur.  Please show the CHEMOTHERAPY ALERT CARD or IMMUNOTHERAPY ALERT CARD at check-in to the  Emergency Department and triage nurse.  Should you have questions after your visit or need to cancel or reschedule your appointment, please contact [facility]  Dept: [phone]  and follow the prompts.  Office hours are 8:00 a.m. to 4:30 p.m. Monday - Friday. Please note that voicemails left after 4:00 p.m. may not be returned until the following business day.  We are closed weekends and major holidays. You have access to a nurse at all times for urgent questions. Please call the main number to the clinic Dept: [phone] and follow the prompts.   For any non-urgent questions, you may also contact your provider using MyChart. We now offer e-Visits for anyone 18 and older to request care online for non-urgent symptoms. For details visit [email or web address].   Also download the MyChart app! Go to the app store, search "MyChart", open the app, select , and log in with your MyChart username and password.  Due to Covid, a mask is required upon entering the hospital/clinic. If you do not have a mask, one will be given to you upon arrival. For doctor visits, patients may have 1 support person aged 18 or older with them. For treatment visits, patients cannot have anyone with them due to current Covid guidelines and our immunocompromised population.   Bortezomib injection What is this medication? BORTEZOMIB (bor TEZ oh mib) targets proteins in cancer cells and stops the cancer cells from growing. It treats multiple myeloma and mantle cell lymphoma. This medicine may be used for other purposes; ask your health care provider or pharmacist if you   have questions. COMMON BRAND NAME(S): Velcade What should I tell my care team before I take this medication? They need to know if you have any of these conditions: dehydration diabetes (high blood sugar) heart disease liver disease tingling of the fingers or toes or other nerve disorder an unusual or allergic  reaction to bortezomib, mannitol, boron, other medicines, foods, dyes, or preservatives pregnant or trying to get pregnant breast-feeding How should I use this medication? This medicine is injected into a vein or under the skin. It is given by a health care provider in a hospital or clinic setting. Talk to your health care provider about the use of this medicine in children. Special care may be needed. Overdosage: If you think you have taken too much of this medicine contact a poison control center or emergency room at once. NOTE: This medicine is only for you. Do not share this medicine with others. What if I miss a dose? Keep appointments for follow-up doses. It is important not to miss your dose. Call your health care provider if you are unable to keep an appointment. What may interact with this medication? This medicine may interact with the following medications: ketoconazole rifampin This list may not describe all possible interactions. Give your health care provider a list of all the medicines, herbs, non-prescription drugs, or dietary supplements you use. Also tell them if you smoke, drink alcohol, or use illegal drugs. Some items may interact with your medicine. What should I watch for while using this medication? Your condition will be monitored carefully while you are receiving this medicine. You may need blood work done while you are taking this medicine. You may get drowsy or dizzy. Do not drive, use machinery, or do anything that needs mental alertness until you know how this medicine affects you. Do not stand up or sit up quickly, especially if you are an older patient. This reduces the risk of dizzy or fainting spells This medicine may increase your risk of getting an infection. Call your health care provider for advice if you get a fever, chills, sore throat, or other symptoms of a cold or flu. Do not treat yourself. Try to avoid being around people who are sick. Check with your  health care provider if you have severe diarrhea, nausea, and vomiting, or if you sweat a lot. The loss of too much body fluid may make it dangerous for you to take this medicine. Do not become pregnant while taking this medicine or for 7 months after stopping it. Women should inform their health care provider if they wish to become pregnant or think they might be pregnant. Men should not father a child while taking this medicine and for 4 months after stopping it. There is a potential for serious harm to an unborn child. Talk to your health care provider for more information. Do not breast-feed an infant while taking this medicine or for 2 months after stopping it. This medicine may make it more difficult to get pregnant or father a child. Talk to your health care provider if you are concerned about your fertility. What side effects may I notice from receiving this medication? Side effects that you should report to your doctor or health care professional as soon as possible: allergic reactions (skin rash; itching or hives; swelling of the face, lips, or tongue) bleeding (bloody or black, tarry stools; red or dark brown urine; spitting up blood or brown material that looks like coffee grounds; red spots on the skin;   unusual bruising or bleeding from the eye, gums, or nose) blurred vision or changes in vision confusion constipation headache heart failure (trouble breathing; fast, irregular heartbeat; sudden weight gain; swelling of the ankles, feet, hands) infection (fever, chills, cough, sore throat, pain or trouble passing urine) lack or loss of appetite liver injury (dark yellow or brown urine; general ill feeling or flu-like symptoms; loss of appetite, right upper belly pain; yellowing of the eyes or skin) low blood pressure (dizziness; feeling faint or lightheaded, falls; unusually weak or tired) muscle cramps pain, redness, or irritation at site where injected pain, tingling, numbness in the  hands or feet seizures trouble breathing unusual bruising or bleeding Side effects that usually do not require medical attention (report to your doctor or health care professional if they continue or are bothersome): diarrhea nausea stomach pain trouble sleeping vomiting This list may not describe all possible side effects. Call your doctor for medical advice about side effects. You may report side effects to FDA at 1-800-FDA-1088. Where should I keep my medication? This medicine is given in a hospital or clinic. It will not be stored at home. NOTE: This sheet is a summary. It may not cover all possible information. If you have questions about this medicine, talk to your doctor, pharmacist, or health care provider.  2022 Elsevier/Gold Standard (2020-06-11 13:22:53)  

## 2021-01-05 NOTE — Progress Notes (Signed)
[facility] Note  Katherine Beard phoned for support resources because she is feeling "off kilter" emotionally after a recent disappointment related to anticipated treatment possibilities.   She is utilizing support resources through other means already, including finding herself a Statistician through a multiple myeloma group and preparing to contact her health insurance to learn about counseling coverage.  Provided empathic listening and emotional support. Normalized and encouraged counseling as a strength and useful tool for coping, suggesting [employer]'s online "find a therapist" feature to give her some agency in the search and selection process. She plans to explore that tool.  We plan to follow up by phone in ca two weeks for a pastoral check-in, and she knows to reach out whenever needed/desired as well.   [place], [place], [facility] Pager [phone] Voicemail [phone]

## 2021-01-06 ENCOUNTER — Other Ambulatory Visit: Payer: Self-pay | Admitting: Nurse Practitioner

## 2021-01-06 DIAGNOSIS — C9000 Multiple myeloma not having achieved remission: Secondary | ICD-10-CM

## 2021-01-06 LAB — KAPPA/LAMBDA LIGHT CHAINS
Kappa free light chain: 7.9 mg/L (ref 3.3–19.4)
Kappa, lambda light chain ratio: 1.25 (ref 0.26–1.65)
Lambda free light chains: 6.3 mg/L (ref 5.7–26.3)

## 2021-01-06 MED ORDER — METHOCARBAMOL 500 MG PO TABS: ORAL_TABLET | ORAL | 1 refills | Status: DC

## 2021-01-06 MED ORDER — DIAZEPAM 5 MG PO TABS: 5.0000 mg | ORAL_TABLET | Freq: Two times a day (BID) | ORAL | 0 refills | Status: DC | PRN

## 2021-01-06 MED ORDER — OXYCODONE HCL 5 MG PO TABS: 5.0000 mg | ORAL_TABLET | ORAL | 0 refills | Status: DC | PRN

## 2021-01-10 ENCOUNTER — Other Ambulatory Visit: Payer: Self-pay | Admitting: Oncology

## 2021-01-11 ENCOUNTER — Inpatient Hospital Stay: Payer: 59

## 2021-01-11 ENCOUNTER — Other Ambulatory Visit: Payer: Self-pay | Admitting: Oncology

## 2021-01-11 ENCOUNTER — Other Ambulatory Visit: Payer: Self-pay

## 2021-01-11 ENCOUNTER — Inpatient Hospital Stay (HOSPITAL_BASED_OUTPATIENT_CLINIC_OR_DEPARTMENT_OTHER): Payer: 59 | Admitting: Oncology

## 2021-01-11 ENCOUNTER — Other Ambulatory Visit (HOSPITAL_BASED_OUTPATIENT_CLINIC_OR_DEPARTMENT_OTHER): Payer: 59

## 2021-01-11 VITALS — BP 129/74 | HR 58 | Temp 97.6°F | Resp 18 | Wt 166.2 lb

## 2021-01-11 DIAGNOSIS — C9000 Multiple myeloma not having achieved remission: Secondary | ICD-10-CM

## 2021-01-11 DIAGNOSIS — Z5112 Encounter for antineoplastic immunotherapy: Secondary | ICD-10-CM | POA: Diagnosis not present

## 2021-01-11 LAB — CMP (CANCER CENTER ONLY)
ALT: 12 U/L (ref 0–44)
AST: 11 U/L — ABNORMAL LOW (ref 15–41)
Albumin: 4 g/dL (ref 3.5–5.0)
Alkaline Phosphatase: 36 U/L — ABNORMAL LOW (ref 38–126)
Anion gap: 10 (ref 5–15)
BUN: 13 mg/dL (ref 6–20)
CO2: 24 mmol/L (ref 22–32)
Calcium: 8.6 mg/dL — ABNORMAL LOW (ref 8.9–10.3)
Chloride: 105 mmol/L (ref 98–111)
Creatinine: 0.64 mg/dL (ref 0.44–1.00)
GFR, Estimated: >60 mL/min (ref >60)
Glucose, Bld: 109 mg/dL — ABNORMAL HIGH (ref 70–99)
Potassium: 3.8 mmol/L (ref 3.5–5.1)
Sodium: 139 mmol/L (ref 135–145)
Total Bilirubin: 0.5 mg/dL (ref 0.3–1.2)
Total Protein: 5.8 g/dL — ABNORMAL LOW (ref 6.5–8.1)

## 2021-01-11 LAB — CBC WITH DIFFERENTIAL (CANCER CENTER ONLY)
Abs Immature Granulocytes: 0.01 10*3/uL (ref 0.00–0.07)
Basophils Absolute: 0.1 10*3/uL (ref 0.0–0.1)
Basophils Relative: 1 %
Eosinophils Absolute: 0 10*3/uL (ref 0.0–0.5)
Eosinophils Relative: 1 %
HCT: 43.2 % (ref 36.0–46.0)
Hemoglobin: 14.6 g/dL (ref 12.0–15.0)
Immature Granulocytes: 0 %
Lymphocytes Relative: 13 %
Lymphs Abs: 0.8 10*3/uL (ref 0.7–4.0)
MCH: 30.4 pg (ref 26.0–34.0)
MCHC: 33.8 g/dL (ref 30.0–36.0)
MCV: 90 fL (ref 80.0–100.0)
Monocytes Absolute: 0.7 10*3/uL (ref 0.1–1.0)
Monocytes Relative: 12 %
Neutro Abs: 4.2 10*3/uL (ref 1.7–7.7)
Neutrophils Relative %: 73 %
Platelet Count: 250 10*3/uL (ref 150–400)
RBC: 4.8 MIL/uL (ref 3.87–5.11)
RDW: 14.2 % (ref 11.5–15.5)
WBC Count: 5.7 10*3/uL (ref 4.0–10.5)
nRBC: 0 % (ref 0.0–0.2)

## 2021-01-11 MED ORDER — DIPHENHYDRAMINE HCL 25 MG PO CAPS
50.0000 mg | ORAL_CAPSULE | Freq: Once | ORAL | Status: AC
Start: 2021-01-11 — End: 2021-01-11
  Administered 2021-01-11: 50 mg via ORAL
  Filled 2021-01-11: qty 2

## 2021-01-11 MED ORDER — DARATUMUMAB-HYALURONIDASE-FIHJ 1800-30000 MG-UT/15ML ~~LOC~~ SOLN
1800.0000 mg | Freq: Once | SUBCUTANEOUS | Status: AC
  Administered 2021-01-11: 1800 mg via SUBCUTANEOUS
  Filled 2021-01-11: qty 15

## 2021-01-11 MED ORDER — ACETAMINOPHEN 325 MG PO TABS
650.0000 mg | ORAL_TABLET | Freq: Once | ORAL | Status: AC
  Administered 2021-01-11: 650 mg via ORAL
  Filled 2021-01-11: qty 2

## 2021-01-11 MED ORDER — BORTEZOMIB CHEMO SQ INJECTION 3.5 MG (2.5MG/ML)
1.0000 mg/m2 | Freq: Once | INTRAMUSCULAR | Status: AC
  Administered 2021-01-11: 1.75 mg via SUBCUTANEOUS
  Filled 2021-01-11: qty 0.7

## 2021-01-11 MED ORDER — DEXAMETHASONE 4 MG PO TABS
20.0000 mg | ORAL_TABLET | Freq: Once | ORAL | Status: AC
  Administered 2021-01-11: 20 mg via ORAL
  Filled 2021-01-11: qty 5

## 2021-01-11 NOTE — Progress Notes (Signed)
K[facility] OFFICE PROGRESS NOTE   Diagnosis: Multiple myeloma  INTERVAL HISTORY:   Katherine Beard begin another cycle of RVD on 12/28/2020.  She continues every 2-week daratumumab.  She has noted improvement in the eyes styes since beginning doxycycline.  Back pain has worsened since she began an exercise program.  She is scheduled to see Dr. Vertell Limber this week. She saw the transplant team at [facility] on 12/31/2020.  She is concerned about the potential toxicities associated with transplant.  She is scheduled to see [name] tomorrow. She has developed worsened depression symptoms.  She feels a sense of "dread "each day.  She is concerned there is no definitive treatment plan following the initial RVD therapy. Objective:  Vital signs in last 24 hours:  Blood pressure 129/74, pulse (!) 58, temperature 97.6 F (36.4 C), temperature source Temporal, resp. rate 18, weight 166 lb 3.2 oz (75.4 kg), last menstrual period 03/06/2011, SpO2 100 %.    HEENT: No thrush, geographic tongue Resp: Lungs clear bilaterally Cardio: Regular rate and rhythm GI: No hepatosplenomegaly Vascular: No leg edema   Lab Results:  Lab Results  Component Value Date   WBC 5.7 01/11/2021   HGB 14.6 01/11/2021   HCT 43.2 01/11/2021   MCV 90.0 01/11/2021   PLT 250 01/11/2021   NEUTROABS 4.2 01/11/2021    CMP  Lab Results  Component Value Date   NA 139 01/11/2021   K 3.8 01/11/2021   CL 105 01/11/2021   CO2 24 01/11/2021   GLUCOSE 109 (H) 01/11/2021   BUN 13 01/11/2021   CREATININE 0.64 01/11/2021   CALCIUM 8.6 (L) 01/11/2021   PROT 5.8 (L) 01/11/2021   ALBUMIN 4.0 01/11/2021   AST 11 (L) 01/11/2021   ALT 12 01/11/2021   ALKPHOS 36 (L) 01/11/2021   BILITOT 0.5 01/11/2021   GFRNONAA >60 01/11/2021   GFRAA 78 12/11/2006   01/05/2021: Kappa free light chain 7.9, lambda free light chain 6.3  Medications: I have reviewed the patient's current medications.   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 Increase serum free lambda light chains Serum M spike MRI pelvis 09/01/2020-innumerable small foci of signal abnormality concerning for multiple myeloma involvement Metastatic bone survey 09/03/2020-no discrete lytic bone lesions, compression fractures at T12, L1, L2, and L3, osteopenia Cycle 1 RVD 09/14/2020 Daratumumab beginning 09/28/2020 Cycle 2 RVD 10/05/2020 plus daratumumab Week 3 daratumumab 10/12/2020 Cycle 3 RVD plus daratumumab 10/26/2020 Cycle 4 RVD plus daratumumab 11/16/2020, daratumumab changed to an every 2-week schedule beginning 11/23/2020 Cycle 5 RVD plus daratumumab 12/07/2020 Cycle 6 RVD plus daratumumab 12/28/2020   2. Severe back pain MRI lumbar spine July 12, 2020-subacute compression fractures at T12 and L1 MRI lumbar spine July 30, 2020-subacute T12 and L1 compression fractures, new subacute superior L2 endplate deformity MRI lumbar spine September 01, 2020 new/progressive compression fractures at L2 and L3, acute compression fracture at L4, evolution of T12 and L1 compression fractures MRI pelvis September 01, 2020-innumerable small foci of signal abnormality in the pelvis and femurs, no fracture identified, concerning for multiple myeloma versus metastases Zometa beginning 09/21/2020-plan monthly x3 then every 3 months, next due August 2022 3. Melanoma left upper back 2014 4. History of diverticulitis 5. Mild hypercalcemia at presentation-resolved 6. Osteoporosis 7. Evusheld 8.  Depression-worsened depression symptoms 01/11/2021     Disposition: Katherine Beard will complete  cycle 6 RVD today.  She has entered clinical remission based on the normalization of the elevated free lambda light chains and correction of anemia.  She will see [name] tomorrow to discuss a maintenance plan.  I will  wait to hear from [name] prior to prescribing further Revlimid and daratumumab.  Katherine Beard has depression.  This is likely situational and related to the myeloma diagnosis.  Decadron could be contributing.  I offered to make a referral to clinical psychology.  She would like to check her insurance first.  She will return for an office visit in 2 weeks.  [name], MD  01/11/2021  10:39 AM

## 2021-01-11 NOTE — Patient Instructions (Signed)
Katherine Beard  Discharge Instructions: Thank you for choosing [facility] to provide your oncology and hematology care.   If you have a lab appointment with the [facility], please go directly to the [facility] and check in at the registration area.   Wear comfortable clothing and clothing appropriate for easy access to any Portacath or PICC line.   We strive to give you quality time with your provider. You may need to reschedule your appointment if you arrive late (15 or more minutes).  Arriving late affects you and other patients whose appointments are after yours.  Also, if you miss three or more appointments without notifying the office, you may be dismissed from the clinic at the provider's discretion.      For prescription refill requests, have your pharmacy contact our office and allow 72 hours for refills to be completed.    Today you received the following chemotherapy and/or immunotherapy agents bortezomib, daratumumab hyaluronidase   To help prevent nausea and vomiting after your treatment, we encourage you to take your nausea medication as directed.  BELOW ARE SYMPTOMS THAT SHOULD BE REPORTED IMMEDIATELY: *FEVER GREATER THAN 100.4 F (38 C) OR HIGHER *CHILLS OR SWEATING *NAUSEA AND VOMITING THAT IS NOT CONTROLLED WITH YOUR NAUSEA MEDICATION *UNUSUAL SHORTNESS OF BREATH *UNUSUAL BRUISING OR BLEEDING *URINARY PROBLEMS (pain or burning when urinating, or frequent urination) *BOWEL PROBLEMS (unusual diarrhea, constipation, pain near the anus) TENDERNESS IN MOUTH AND THROAT WITH OR WITHOUT PRESENCE OF ULCERS (sore throat, sores in mouth, or a toothache) UNUSUAL RASH, SWELLING OR PAIN  UNUSUAL VAGINAL DISCHARGE OR ITCHING   Items with * indicate a potential emergency and should be followed up as soon as possible or go to the Emergency Department if any problems should occur.  Please show the CHEMOTHERAPY ALERT CARD or IMMUNOTHERAPY ALERT  CARD at check-in to the Emergency Department and triage nurse.  Should you have questions after your visit or need to cancel or reschedule your appointment, please contact [facility]  Dept: [phone]  and follow the prompts.  Office hours are 8:00 a.m. to 4:30 p.m. Monday - Friday. Please note that voicemails left after 4:00 p.m. may not be returned until the following business day.  We are closed weekends and major holidays. You have access to a nurse at all times for urgent questions. Please call the main number to the clinic Dept: [phone] and follow the prompts.   For any non-urgent questions, you may also contact your provider using MyChart. We now offer e-Visits for anyone 61 and older to request care online for non-urgent symptoms. For details visit [email or web address].   Also download the MyChart app! Go to the app store, search "MyChart", open the app, select [facility], and log in with your MyChart username and password.  Due to Covid, a mask is required upon entering the hospital/clinic. If you do not have a mask, one will be given to you upon arrival. For doctor visits, patients may have 1 support person aged 33 or older with them. For treatment visits, patients cannot have anyone with them due to current Covid guidelines and our immunocompromised population.   Bortezomib injection What is this medication? BORTEZOMIB (bor TEZ oh mib) targets proteins in cancer cells and stops thecancer cells from growing. It treats multiple myeloma and mantle cell lymphoma. This medicine may be used for other purposes; ask your health care provider orpharmacist if you have questions. COMMON BRAND  NAME(S): Velcade What should I tell my care team before I take this medication? They need to know if you have any of these conditions: dehydration diabetes (high blood sugar) heart disease liver disease tingling of the fingers or toes or other nerve disorder an  unusual or allergic reaction to bortezomib, mannitol, boron, other medicines, foods, dyes, or preservatives pregnant or trying to get pregnant breast-feeding How should I use this medication? This medicine is injected into a vein or under the skin. It is given by ahealth care provider in a hospital or clinic setting. Talk to your health care provider about the use of this medicine in children.Special care may be needed. Overdosage: If you think you have taken too much of this medicine contact apoison control center or emergency room at once. NOTE: This medicine is only for you. Do not share this medicine with others. What if I miss a dose? Keep appointments for follow-up doses. It is important not to miss your dose.Call your health care provider if you are unable to keep an appointment. What may interact with this medication? This medicine may interact with the following medications: ketoconazole rifampin This list may not describe all possible interactions. Give your health care provider a list of all the medicines, herbs, non-prescription drugs, or dietary supplements you use. Also tell them if you smoke, drink alcohol, or use illegaldrugs. Some items may interact with your medicine. What should I watch for while using this medication? Your condition will be monitored carefully while you are receiving thismedicine. You may need blood work done while you are taking this medicine. You may get drowsy or dizzy. Do not drive, use machinery, or do anything that needs mental alertness until you know how this medicine affects you. Do not stand up or sit up quickly, especially if you are an older patient. Thisreduces the risk of dizzy or fainting spells This medicine may increase your risk of getting an infection. Call your health care provider for advice if you get a fever, chills, sore throat, or other symptoms of a cold or flu. Do not treat yourself. Try to avoid being aroundpeople who are  sick. Check with your health care provider if you have severe diarrhea, nausea, and vomiting, or if you sweat a lot. The loss of too much body fluid may make itdangerous for you to take this medicine. Do not become pregnant while taking this medicine or for 7 months after stopping it. Women should inform their health care provider if they wish to become pregnant or think they might be pregnant. Men should not father a child while taking this medicine and for 4 months after stopping it. There is a potential for serious harm to an unborn child. Talk to your health care provider for more information. Do not breast-feed an infant while taking thismedicine or for 2 months after stopping it. This medicine may make it more difficult to get pregnant or father a child.Talk to your health care provider if you are concerned about your fertility. What side effects may I notice from receiving this medication? Side effects that you should report to your doctor or health care professionalas soon as possible: allergic reactions (skin rash; itching or hives; swelling of the face, lips, or tongue) bleeding (bloody or black, tarry stools; red or dark brown urine; spitting up blood or brown material that looks like coffee grounds; red spots on the skin; unusual bruising or bleeding from the eye, gums, or nose) blurred vision or changes in vision  confusion constipation headache heart failure (trouble breathing; fast, irregular heartbeat; sudden weight gain; swelling of the ankles, feet, hands) infection (fever, chills, cough, sore throat, pain or trouble passing urine) lack or loss of appetite liver injury (dark yellow or brown urine; general ill feeling or flu-like symptoms; loss of appetite, right upper belly pain; yellowing of the eyes or skin) low blood pressure (dizziness; feeling faint or lightheaded, falls; unusually weak or tired) muscle cramps pain, redness, or irritation at site where injected pain,  tingling, numbness in the hands or feet seizures trouble breathing unusual bruising or bleeding Side effects that usually do not require medical attention (report to yourdoctor or health care professional if they continue or are bothersome): diarrhea nausea stomach pain trouble sleeping vomiting This list may not describe all possible side effects. Call your doctor for medical advice about side effects. You may report side effects to FDA at1-800-FDA-1088. Where should I keep my medication? This medicine is given in a hospital or clinic. It will not be stored at home. NOTE: This sheet is a summary. It may not cover all possible information. If you have questions about this medicine, talk to your doctor, pharmacist, orhealth care provider.  2022 Elsevier/Gold Standard (2020-06-11 13:22:53)  Daratumumab; Hyaluronidase Injection What is this medication? DARATUMUMAB; HYALURONIDASE (dar a toom ue mab / hye al ur ON i dase) is a monoclonal antibody. Hyaluronidase is used to improve the effects of daratumumab. It treats certain types of cancer. Some of the cancers treated aremultiple myeloma and light-chain amyloidosis. This medicine may be used for other purposes; ask your health care provider orpharmacist if you have questions. COMMON BRAND NAME(S): DARZALEX FASPRO What should I tell my care team before I take this medication? They need to know if you have any of these conditions: heart disease infection especially a viral infection such as chickenpox, cold sores, herpes, or hepatitis B lung or breathing disease an unusual or allergic reaction to daratumumab, hyaluronidase, other medicines, foods, dyes, or preservatives pregnant or trying to get pregnant breast-feeding How should I use this medication? This medicine is for injection under the skin. It is given by a health careprofessional in a hospital or clinic setting. Talk to your pediatrician regarding the use of this medicine in  children.Special care may be needed. Overdosage: If you think you have taken too much of this medicine contact apoison control center or emergency room at once. NOTE: This medicine is only for you. Do not share this medicine with others. What if I miss a dose? Keep appointments for follow-up doses as directed. It is important not to miss your dose. Call your doctor or health care professional if you are unable tokeep an appointment. What may interact with this medication? Interactions have not been studied. This list may not describe all possible interactions. Give your health care provider a list of all the medicines, herbs, non-prescription drugs, or dietary supplements you use. Also tell them if you smoke, drink alcohol, or use illegaldrugs. Some items may interact with your medicine. What should I watch for while using this medication? Your condition will be monitored carefully while you are receiving thismedicine. This medicine can cause serious allergic reactions. To reduce your risk, your health care provider may give you other medicine to take before receiving thisone. Be sure to follow the directions from your health care provider. This medicine can affect the results of blood tests to match your blood type. These changes can last for up to 6 months after the  final dose. Your healthcare provider will do blood tests to match your blood type before you start treatment. Tell all of your healthcare providers that you are being treatedwith this medicine before receiving a blood transfusion. This medicine can affect the results of some tests used to determine treatmentresponse; extra tests may be needed to evaluate response. Do not become pregnant while taking this medicine or for 3 months after stopping it. Women should inform their health care provider if they wish to become pregnant or think they might be pregnant. There is a potential for serious side effects to an unborn child. Talk to your  health care provider formore information. Do not breast-feed an infant while taking this medicine. What side effects may I notice from receiving this medication? Side effects that you should report to your doctor or health care professionalas soon as possible: allergic reactions like skin rash, itching or hives, swelling of the face, lips, or tongue blurred vision breathing problems chest pain cough dizziness fast heartbeat feeling faint or lightheaded headache low blood counts - this medicine may decrease the number of white blood cells, red blood cells and platelets. You may be at increased risk for infections and bleeding. nausea, vomiting shortness of breath signs of decreased platelets or bleeding - bruising, pinpoint red spots on the skin, black, tarry stools, blood in the urine signs of decreased red blood cells - unusually weak or tired, feeling faint or lightheaded, falls signs of infection - fever or chills, cough, sore throat, pain or difficulty passing urine Side effects that usually do not require medical attention (report these toyour doctor or health care professional if they continue or are bothersome): back pain constipation diarrhea pain, tingling, numbness in the hands or feet muscle cramps swelling of the ankles, feet, hands tiredness trouble sleeping This list may not describe all possible side effects. Call your doctor for medical advice about side effects. You may report side effects to FDA at1-800-FDA-1088. Where should I keep my medication? This drug is given in a hospital or clinic and will not be stored at home. NOTE: This sheet is a summary. It may not cover all possible information. If you have questions about this medicine, talk to your doctor, pharmacist, orhealth care provider.  2022 Elsevier/Gold Standard (2020-07-30 12:51:54)

## 2021-01-12 ENCOUNTER — Telehealth: Payer: Self-pay | Admitting: *Deleted

## 2021-01-12 NOTE — Telephone Encounter (Signed)
Provided patient name and phone # of a patient (BM) that has had myeloma and had BMT at [facility] and is doing great to call if she has any questions about transplant and how she did. She appreciates the information. Saw [name] today and they have decided on maintenance for now.

## 2021-01-13 ENCOUNTER — Telehealth: Payer: Self-pay

## 2021-01-13 ENCOUNTER — Other Ambulatory Visit: Payer: Self-pay

## 2021-01-13 DIAGNOSIS — C9000 Multiple myeloma not having achieved remission: Secondary | ICD-10-CM

## 2021-01-13 MED ORDER — LENALIDOMIDE 10 MG PO CAPS: 10.0000 mg | ORAL_CAPSULE | Freq: Every day | ORAL | 0 refills | Status: DC

## 2021-01-13 NOTE — Telephone Encounter (Signed)
Per Dr Benay Spice medication change as of today Revlimid 10mg  qd 21 out of 28 days cycle will start 01/18/21. Pt aware of change. Pt also had questions about discontinuing medication Doxycycline, ASA 81mg , and acyclovir. Discussed with Dr. Benay Spice informed Pt per Dr Benay Spice to continue the ASA 81mg  and acyclovir. Explained to Pt that  after velcade is stopped ,acyclovir is continued for 3 months after treatment is stopped. Pt verbalized understanding. No further problems or concerns noted.

## 2021-01-13 NOTE — Telephone Encounter (Signed)
[employer] called to update prescription and Survey authorization number .

## 2021-01-15 ENCOUNTER — Encounter: Payer: Self-pay | Admitting: General Practice

## 2021-01-15 NOTE — Progress Notes (Signed)
[facility] Spiritual Care Note  Followed up with Mickel Baas by phone for a pastoral check-in. She reports a very helpful appointment with her specialist, which has helped her reach clarity about next steps in her treatment plan (harvesting stem cells soon and saving them for a possible future transplant, rather than an immediate one). This clarity is giving her a relaxed peace of mind and heart. We plan to follow up again by phone next month, and she is aware of ongoing chaplain availability, should needs arise in the meantime.   [place], [place], [facility] Pager [phone] Voicemail [phone]

## 2021-01-20 ENCOUNTER — Other Ambulatory Visit: Payer: Self-pay

## 2021-01-20 DIAGNOSIS — F3289 Other specified depressive episodes: Secondary | ICD-10-CM

## 2021-01-21 ENCOUNTER — Encounter: Payer: Self-pay | Admitting: Oncology

## 2021-01-26 ENCOUNTER — Inpatient Hospital Stay: Payer: 59

## 2021-01-26 ENCOUNTER — Other Ambulatory Visit (HOSPITAL_BASED_OUTPATIENT_CLINIC_OR_DEPARTMENT_OTHER): Payer: Self-pay

## 2021-01-26 ENCOUNTER — Encounter: Payer: Self-pay | Admitting: Oncology

## 2021-01-26 ENCOUNTER — Inpatient Hospital Stay (HOSPITAL_BASED_OUTPATIENT_CLINIC_OR_DEPARTMENT_OTHER): Payer: 59 | Admitting: Oncology

## 2021-01-26 ENCOUNTER — Ambulatory Visit: Payer: 59 | Attending: Internal Medicine

## 2021-01-26 ENCOUNTER — Other Ambulatory Visit: Payer: Self-pay

## 2021-01-26 VITALS — BP 128/71 | HR 56 | Temp 98.1°F | Resp 16 | Wt 164.4 lb

## 2021-01-26 DIAGNOSIS — Z5112 Encounter for antineoplastic immunotherapy: Secondary | ICD-10-CM | POA: Diagnosis not present

## 2021-01-26 DIAGNOSIS — C9000 Multiple myeloma not having achieved remission: Secondary | ICD-10-CM

## 2021-01-26 DIAGNOSIS — Z23 Encounter for immunization: Secondary | ICD-10-CM

## 2021-01-26 LAB — CBC WITH DIFFERENTIAL (CANCER CENTER ONLY)
Abs Immature Granulocytes: 0.02 10*3/uL (ref 0.00–0.07)
Basophils Absolute: 0 10*3/uL (ref 0.0–0.1)
Basophils Relative: 1 %
Eosinophils Absolute: 0.1 10*3/uL (ref 0.0–0.5)
Eosinophils Relative: 1 %
HCT: 41.1 % (ref 36.0–46.0)
Hemoglobin: 13.9 g/dL (ref 12.0–15.0)
Immature Granulocytes: 0 %
Lymphocytes Relative: 13 %
Lymphs Abs: 0.6 10*3/uL — ABNORMAL LOW (ref 0.7–4.0)
MCH: 30.5 pg (ref 26.0–34.0)
MCHC: 33.8 g/dL (ref 30.0–36.0)
MCV: 90.3 fL (ref 80.0–100.0)
Monocytes Absolute: 0.3 10*3/uL (ref 0.1–1.0)
Monocytes Relative: 6 %
Neutro Abs: 3.7 10*3/uL (ref 1.7–7.7)
Neutrophils Relative %: 79 %
Platelet Count: 272 10*3/uL (ref 150–400)
RBC: 4.55 MIL/uL (ref 3.87–5.11)
RDW: 14.4 % (ref 11.5–15.5)
WBC Count: 4.7 10*3/uL (ref 4.0–10.5)
nRBC: 0 % (ref 0.0–0.2)

## 2021-01-26 LAB — CMP (CANCER CENTER ONLY)
ALT: 13 U/L (ref 0–44)
AST: 10 U/L — ABNORMAL LOW (ref 15–41)
Albumin: 4.1 g/dL (ref 3.5–5.0)
Alkaline Phosphatase: 44 U/L (ref 38–126)
Anion gap: 9 (ref 5–15)
BUN: 14 mg/dL (ref 6–20)
CO2: 26 mmol/L (ref 22–32)
Calcium: 8.9 mg/dL (ref 8.9–10.3)
Chloride: 103 mmol/L (ref 98–111)
Creatinine: 0.66 mg/dL (ref 0.44–1.00)
GFR, Estimated: >60 mL/min (ref >60)
Glucose, Bld: 102 mg/dL — ABNORMAL HIGH (ref 70–99)
Potassium: 3.9 mmol/L (ref 3.5–5.1)
Sodium: 138 mmol/L (ref 135–145)
Total Bilirubin: 0.4 mg/dL (ref 0.3–1.2)
Total Protein: 6.4 g/dL — ABNORMAL LOW (ref 6.5–8.1)

## 2021-01-26 MED ORDER — PFIZER-BIONT COVID-19 VAC-TRIS 30 MCG/0.3ML IM SUSP
INTRAMUSCULAR | 0 refills | Status: DC
  Filled 2021-01-26: qty 0.3, 1d supply, fill #0

## 2021-01-26 NOTE — Progress Notes (Signed)
[facility] OFFICE PROGRESS NOTE   Diagnosis: Multiple myeloma  INTERVAL HISTORY:   Ms. Fedele returns for a scheduled visit.  She was seen by the transplant service and [name] at [facility] earlier this month.  She decided against stem cell therapy.  She started maintenance Revlimid on 01/18/2021.  No diarrhea, rash, or neuropathy symptoms.  She has increased pain at the mid upper back.  She saw Dr. Vertell Limber on 01/20/2021.  An MRI of the thoracic spine revealed a new T3 compression fracture.  She has discontinued physical therapy.  She takes muscle relaxants and oxycodone as needed.  She was able to walk a mile this week.  Ms. Camerer continues to have depression symptoms.  She is scheduled for a psychology consult this week.  Objective:  Vital signs in last 24 hours:  Blood pressure 128/71, pulse (!) 56, temperature 98.1 F (36.7 C), temperature source Oral, resp. rate 16, weight 164 lb 6.4 oz (74.6 kg), last menstrual period 03/06/2011, SpO2 100 %.    HEENT: No thrush Resp: Lungs clear bilaterally Cardio: Regular rate and rhythm GI: No hepatosplenomegaly Vascular: No leg edema     Lab Results:  Lab Results  Component Value Date   WBC 4.7 01/26/2021   HGB 13.9 01/26/2021   HCT 41.1 01/26/2021   MCV 90.3 01/26/2021   PLT 272 01/26/2021   NEUTROABS 3.7 01/26/2021    CMP  Lab Results  Component Value Date   NA 138 01/26/2021   K 3.9 01/26/2021   CL 103 01/26/2021   CO2 26 01/26/2021   GLUCOSE 102 (H) 01/26/2021   BUN 14 01/26/2021   CREATININE 0.66 01/26/2021   CALCIUM 8.9 01/26/2021   PROT 6.4 (L) 01/26/2021   ALBUMIN 4.1 01/26/2021   AST 10 (L) 01/26/2021   ALT 13 01/26/2021   ALKPHOS 44 01/26/2021   BILITOT 0.4 01/26/2021   GFRNONAA >60 01/26/2021   GFRAA 78 12/11/2006     Medications: I have reviewed the patient's current medications.   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 Increase serum free lambda light chains Serum M spike MRI pelvis 09/01/2020-innumerable small foci of signal abnormality concerning for multiple myeloma involvement Metastatic bone survey 09/03/2020-no discrete lytic bone lesions, compression fractures at T12, L1, L2, and L3, osteopenia Cycle 1 RVD 09/14/2020 Daratumumab beginning 09/28/2020 Cycle 2 RVD 10/05/2020 plus daratumumab Week 3 daratumumab 10/12/2020 Normal lambda light chains 10/26/2020 Cycle 3 RVD plus daratumumab 10/26/2020 Cycle 4 RVD plus daratumumab 11/16/2020, daratumumab changed to an every 2-week schedule beginning 11/23/2020 Cycle 5 RVD plus daratumumab 12/07/2020 Cycle 6 RVD plus daratumumab 12/28/2020 Normal lambda light chains 01/05/2021 Revlimid maintenance 21/28 days beginning 01/18/2021   2. Severe back pain MRI lumbar spine July 12, 2020-subacute compression fractures at T12 and L1 MRI lumbar spine July 30, 2020-subacute T12 and L1 compression fractures, new subacute superior L2 endplate deformity MRI lumbar spine September 01, 2020 new/progressive compression fractures at L2 and L3, acute compression fracture at L4, evolution of T12 and L1 compression fractures MRI pelvis September 01, 2020-innumerable small foci of signal abnormality in the pelvis and femurs, no fracture identified, concerning for multiple myeloma versus metastases Zometa beginning 09/21/2020-plan monthly x3 then every 3 months, next due August 2022 MRI thoracic spine July 2022-new T3 compression fracture with multiple additional thoracic compression fractures 3. Melanoma left upper back 2014  4. History of diverticulitis 5. Mild hypercalcemia at presentation-resolved 6. Osteoporosis 7. Evusheld 8.  Depression-worsened depression symptoms 01/11/2021, referred to psychology   Disposition: Ms. Coderre completed 6 cycles of RVD and daratumumab.  She is in  clinical remission from myeloma.  She began lenalidomide maintenance on 01/18/2021.  The plan is to continue lenalidomide maintenance.  She is scheduled for stem cell harvest at [facility] in September.  She will return for an office visit, Zometa, and a repeat myeloma panel on 02/12/2021.  She will continue follow-up with Dr. Vertell Limber for management of compression fractures.  Ms. Cousin is scheduled for a consult with psychology this week.  [name], MD  01/26/2021  1:41 PM

## 2021-01-26 NOTE — Progress Notes (Signed)
   Covid-19 Vaccination Clinic  Name:  Katherine Beard    MRN: [id number] DOB: 12-19-1960  01/26/2021  [name] was observed post Covid-19 immunization for 15 minutes without incident. She was provided with Vaccine Information Sheet and instruction to access the V-Safe system.   [name] was instructed to call 911 with any severe reactions post vaccine: Difficulty breathing  Swelling of face and throat  A fast heartbeat  A bad rash all over body  Dizziness and weakness   Immunizations Administered     Name Date Dose VIS Date Route   PFIZER Comrnaty(Gray TOP) Covid-19 Vaccine 01/26/2021 11:05 AM 0.3 mL 06/11/2020 Intramuscular   Manufacturer: [place]   Lot: [id number]   [place]: [phone]

## 2021-02-01 ENCOUNTER — Other Ambulatory Visit: Payer: Self-pay | Admitting: Nurse Practitioner

## 2021-02-01 ENCOUNTER — Encounter: Payer: Self-pay | Admitting: *Deleted

## 2021-02-01 ENCOUNTER — Other Ambulatory Visit: Payer: Self-pay | Admitting: Oncology

## 2021-02-01 DIAGNOSIS — C9000 Multiple myeloma not having achieved remission: Secondary | ICD-10-CM

## 2021-02-01 MED ORDER — OXYCODONE HCL 5 MG PO TABS: 5.0000 mg | ORAL_TABLET | ORAL | 0 refills | Status: DC | PRN

## 2021-02-01 MED ORDER — LENALIDOMIDE 10 MG PO CAPS: 10.0000 mg | ORAL_CAPSULE | Freq: Every day | ORAL | 0 refills | Status: DC

## 2021-02-01 NOTE — Progress Notes (Signed)
Obtained override from [employer] to refill her Revlimid early due to going on vacation. Script sent to [facility].

## 2021-02-01 NOTE — Telephone Encounter (Signed)
Refill request

## 2021-02-03 ENCOUNTER — Ambulatory Visit (INDEPENDENT_AMBULATORY_CARE_PROVIDER_SITE_OTHER): Payer: PRIVATE HEALTH INSURANCE | Admitting: Psychologist

## 2021-02-03 DIAGNOSIS — F321 Major depressive disorder, single episode, moderate: Secondary | ICD-10-CM

## 2021-02-04 ENCOUNTER — Telehealth: Payer: Self-pay

## 2021-02-04 NOTE — Telephone Encounter (Signed)
TC from Katherine Beard with [phone] stating that Pt she faxed a transplant calendar on above Pt. [name] stated Pt will not get transplant now but will need to stop treatment from 03/16/21-03/29/21 and will resume treatment after her apheresis treatment. Transplant calendar to be scanned Dr Benay Spice aware.

## 2021-02-12 ENCOUNTER — Inpatient Hospital Stay: Payer: 59 | Attending: Oncology

## 2021-02-12 ENCOUNTER — Inpatient Hospital Stay (HOSPITAL_BASED_OUTPATIENT_CLINIC_OR_DEPARTMENT_OTHER): Payer: 59 | Admitting: Oncology

## 2021-02-12 ENCOUNTER — Inpatient Hospital Stay: Payer: 59

## 2021-02-12 ENCOUNTER — Other Ambulatory Visit: Payer: Self-pay

## 2021-02-12 VITALS — BP 129/79 | HR 58 | Temp 97.8°F | Resp 18 | Ht 65.0 in | Wt 171.0 lb

## 2021-02-12 DIAGNOSIS — M549 Dorsalgia, unspecified: Secondary | ICD-10-CM | POA: Insufficient documentation

## 2021-02-12 DIAGNOSIS — C9000 Multiple myeloma not having achieved remission: Secondary | ICD-10-CM

## 2021-02-12 DIAGNOSIS — F32A Depression, unspecified: Secondary | ICD-10-CM | POA: Diagnosis not present

## 2021-02-12 DIAGNOSIS — Z85820 Personal history of malignant melanoma of skin: Secondary | ICD-10-CM | POA: Diagnosis not present

## 2021-02-12 LAB — CBC WITH DIFFERENTIAL (CANCER CENTER ONLY)
Abs Immature Granulocytes: 0.01 10*3/uL (ref 0.00–0.07)
Basophils Absolute: 0.1 10*3/uL (ref 0.0–0.1)
Basophils Relative: 2 %
Eosinophils Absolute: 0.1 10*3/uL (ref 0.0–0.5)
Eosinophils Relative: 3 %
HCT: 38.2 % (ref 36.0–46.0)
Hemoglobin: 12.8 g/dL (ref 12.0–15.0)
Immature Granulocytes: 0 %
Lymphocytes Relative: 22 %
Lymphs Abs: 0.6 10*3/uL — ABNORMAL LOW (ref 0.7–4.0)
MCH: 30.3 pg (ref 26.0–34.0)
MCHC: 33.5 g/dL (ref 30.0–36.0)
MCV: 90.3 fL (ref 80.0–100.0)
Monocytes Absolute: 0.3 10*3/uL (ref 0.1–1.0)
Monocytes Relative: 11 %
Neutro Abs: 1.8 10*3/uL (ref 1.7–7.7)
Neutrophils Relative %: 62 %
Platelet Count: 173 10*3/uL (ref 150–400)
RBC: 4.23 MIL/uL (ref 3.87–5.11)
RDW: 14.8 % (ref 11.5–15.5)
WBC Count: 3 10*3/uL — ABNORMAL LOW (ref 4.0–10.5)
nRBC: 0 % (ref 0.0–0.2)

## 2021-02-12 LAB — CMP (CANCER CENTER ONLY)
ALT: 13 U/L (ref 0–44)
AST: 13 U/L — ABNORMAL LOW (ref 15–41)
Albumin: 3.8 g/dL (ref 3.5–5.0)
Alkaline Phosphatase: 31 U/L — ABNORMAL LOW (ref 38–126)
Anion gap: 9 (ref 5–15)
BUN: 13 mg/dL (ref 6–20)
CO2: 26 mmol/L (ref 22–32)
Calcium: 8.8 mg/dL — ABNORMAL LOW (ref 8.9–10.3)
Chloride: 106 mmol/L (ref 98–111)
Creatinine: 0.67 mg/dL (ref 0.44–1.00)
GFR, Estimated: >60 mL/min (ref >60)
Glucose, Bld: 121 mg/dL — ABNORMAL HIGH (ref 70–99)
Potassium: 3.8 mmol/L (ref 3.5–5.1)
Sodium: 141 mmol/L (ref 135–145)
Total Bilirubin: 0.5 mg/dL (ref 0.3–1.2)
Total Protein: 5.7 g/dL — ABNORMAL LOW (ref 6.5–8.1)

## 2021-02-12 MED ORDER — SODIUM CHLORIDE 0.9 % IV SOLN
Freq: Once | INTRAVENOUS | Status: AC
  Filled 2021-02-12: qty 250

## 2021-02-12 MED ORDER — ESCITALOPRAM OXALATE 5 MG PO TABS: 5.0000 mg | ORAL_TABLET | Freq: Every day | ORAL | 0 refills | Status: DC

## 2021-02-12 MED ORDER — ZOLEDRONIC ACID 4 MG/100ML IV SOLN
4.0000 mg | Freq: Once | INTRAVENOUS | Status: AC
  Administered 2021-02-12: 4 mg via INTRAVENOUS
  Filled 2021-02-12: qty 100

## 2021-02-12 NOTE — Addendum Note (Signed)
Addended by: [name] B on: 02/12/2021 09:42 AM   Modules accepted: Orders

## 2021-02-12 NOTE — Progress Notes (Signed)
[facility] OFFICE PROGRESS NOTE   Diagnosis: Multiple myeloma  INTERVAL HISTORY:   Katherine Beard returns as scheduled.  She continues Revlimid maintenance.  No rash, diarrhea, or neuropathy symptoms.  She continues to have intermittent back pain.  She takes oxycodone as needed.  She complains of insomnia despite Tylenol PM and melatonin.  She has anxiety and depression.  She has a sense of "dread "at the beginning of each day.  She would like to begin an antidepressant.  She has been evaluated by psychology.  Objective:  Vital signs in last 24 hours:  Blood pressure 129/79, pulse (!) 58, temperature 97.8 F (36.6 C), temperature source Oral, resp. rate 18, height 5' 5"  (1.651 m), weight 171 lb (77.6 kg), last menstrual period 03/06/2011, SpO2 99 %.    HEENT: No thrush, geographic tongue Resp: Lungs clear bilaterally Cardio: Regular rate and rhythm GI: No hepatosplenomegaly Vascular: No leg edema Skin: No rash  Portacath/PICC-without erythema  Lab Results:  Lab Results  Component Value Date   WBC 3.0 (L) 02/12/2021   HGB 12.8 02/12/2021   HCT 38.2 02/12/2021   MCV 90.3 02/12/2021   PLT 173 02/12/2021   NEUTROABS 1.8 02/12/2021    CMP  Lab Results  Component Value Date   NA 138 01/26/2021   K 3.9 01/26/2021   CL 103 01/26/2021   CO2 26 01/26/2021   GLUCOSE 102 (H) 01/26/2021   BUN 14 01/26/2021   CREATININE 0.66 01/26/2021   CALCIUM 8.9 01/26/2021   PROT 6.4 (L) 01/26/2021   ALBUMIN 4.1 01/26/2021   AST 10 (L) 01/26/2021   ALT 13 01/26/2021   ALKPHOS 44 01/26/2021   BILITOT 0.4 01/26/2021   GFRNONAA >60 01/26/2021   GFRAA 78 12/11/2006   Medications: I have reviewed the patient's current medications.   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 Increase serum free lambda  light chains Serum M spike MRI pelvis 09/01/2020-innumerable small foci of signal abnormality concerning for multiple myeloma involvement Metastatic bone survey 09/03/2020-no discrete lytic bone lesions, compression fractures at T12, L1, L2, and L3, osteopenia Cycle 1 RVD 09/14/2020 Daratumumab beginning 09/28/2020 Cycle 2 RVD 10/05/2020 plus daratumumab Week 3 daratumumab 10/12/2020 Normal lambda light chains 10/26/2020 Cycle 3 RVD plus daratumumab 10/26/2020 Cycle 4 RVD plus daratumumab 11/16/2020, daratumumab changed to an every 2-week schedule beginning 11/23/2020 Cycle 5 RVD plus daratumumab 12/07/2020 Cycle 6 RVD plus daratumumab 12/28/2020 Normal lambda light chains 01/05/2021 Revlimid maintenance 21/28 days beginning 01/18/2021   2. Severe back pain MRI lumbar spine July 12, 2020-subacute compression fractures at T12 and L1 MRI lumbar spine July 30, 2020-subacute T12 and L1 compression fractures, new subacute superior L2 endplate deformity MRI lumbar spine September 01, 2020 new/progressive compression fractures at L2 and L3, acute compression fracture at L4, evolution of T12 and L1 compression fractures MRI pelvis September 01, 2020-innumerable small foci of signal abnormality in the pelvis and femurs, no fracture identified, concerning for multiple myeloma versus metastases Zometa beginning 09/21/2020-plan monthly x3 then every 3 months, next due August 2022 MRI thoracic spine July 2022-new T3 compression fracture with multiple additional thoracic compression fractures 3. Melanoma left upper back 2014 4. History of diverticulitis 5. Mild hypercalcemia at presentation-resolved 6. Osteoporosis 7. Evusheld 8.  Depression-worsened depression symptoms 01/11/2021, referred to psychology     Disposition: Katherine Beard appears stable.  She will begin  another cycle of Revlimid maintenance on 02/15/2021.  She is scheduled for stem cell harvest at [facility] on 03/29/2021.  The next cycle of Revlimid will be held until  after the stem cell harvest.  She will return for an office and lab visit on 03/31/2021.  She will begin Lexapro and continue follow-up with psychology.  Katherine Beard will receive Zometa today.  [name], MD  02/12/2021  8:39 AM

## 2021-02-12 NOTE — Patient Instructions (Signed)
Katherine Beard  Discharge Instructions: Thank you for choosing [facility] to provide your oncology and hematology care.   If you have a lab appointment with the [facility], please go directly to the [facility] and check in at the registration area.   Wear comfortable clothing and clothing appropriate for easy access to any Portacath or PICC line.   We strive to give you quality time with your provider. You may need to reschedule your appointment if you arrive late (15 or more minutes).  Arriving late affects you and other patients whose appointments are after yours.  Also, if you miss three or more appointments without notifying the office, you may be dismissed from the clinic at the provider's discretion.      For prescription refill requests, have your pharmacy contact our office and allow 72 hours for refills to be completed.    Today you received: Zometa  Zoledronic Acid Injection (Hypercalcemia, Oncology) What is this medication? ZOLEDRONIC ACID (ZOE le dron ik AS id) slows calcium loss from bones. It high calcium levels in the blood from some kinds of cancer. It may be used in otherpeople at risk for bone loss. This medicine may be used for other purposes; ask your health care provider orpharmacist if you have questions. COMMON BRAND NAME(S): Zometa What should I tell my care team before I take this medication? They need to know if you have any of these conditions: cancer dehydration dental disease kidney disease liver disease low levels of calcium in the blood lung or breathing disease (asthma) receiving steroids like dexamethasone or prednisone an unusual or allergic reaction to zoledronic acid, other medicines, foods, dyes, or preservatives pregnant or trying to get pregnant breast-feeding How should I use this medication? This drug is injected into a vein. It is given by a health care provider in [place] or clinic  setting. Talk to your health care provider about the use of this drug in children.Special care may be needed. Overdosage: If you think you have taken too much of this medicine contact apoison control center or emergency room at once. NOTE: This medicine is only for you. Do not share this medicine with others. What if I miss a dose? Keep appointments for follow-up doses. It is important not to miss your dose.Call your health care provider if you are unable to keep an appointment. What may interact with this medication? certain antibiotics given by injection NSAIDs, medicines for pain and inflammation, like ibuprofen or naproxen some diuretics like bumetanide, furosemide teriparatide thalidomide This list may not describe all possible interactions. Give your health care provider a list of all the medicines, herbs, non-prescription drugs, or dietary supplements you use. Also tell them if you smoke, drink alcohol, or use illegaldrugs. Some items may interact with your medicine. What should I watch for while using this medication? Visit your health care provider for regular checks on your progress. It may besome time before you see the benefit from this drug. Some people who take this drug have severe bone, joint, or muscle pain. This drug may also increase your risk for jaw problems or a broken thigh bone. Tell your health care provider right away if you have severe pain in your jaw, bones, joints, or muscles. Tell you health care provider if you have any painthat does not go away or that gets worse. Tell your dentist and dental surgeon that you are taking this drug. You should not have major dental surgery  while on this drug. See your dentist to have a dental exam and fix any dental problems before starting this drug. Take good care of your teeth while on this drug. Make sure you see your dentist forregular follow-up appointments. You should make sure you get enough calcium and vitamin D while you are  taking this drug. Discuss the foods you eat and the vitamins you take with your healthcare provider. Check with your health care provider if you have severe diarrhea, nausea, and vomiting, or if you sweat a lot. The loss of too much body fluid may make itdangerous for you to take this drug. You may need blood work done while you are taking this drug. Do not become pregnant while taking this drug. Women should inform their health care provider if they wish to become pregnant or think they might be pregnant. There is potential for serious harm to an unborn child. Talk to your healthcare provider for more information. What side effects may I notice from receiving this medication? Side effects that you should report to your doctor or health care provider assoon as possible: allergic reactions (skin rash, itching or hives; swelling of the face, lips, or tongue) bone pain infection (fever, chills, cough, sore throat, pain or trouble passing urine) jaw pain, especially after dental work joint pain kidney injury (trouble passing urine or change in the amount of urine) low blood pressure (dizziness; feeling faint or lightheaded, falls; unusually weak or tired) low calcium levels (fast heartbeat; muscle cramps or pain; pain, tingling, or numbness in the hands or feet; seizures) low magnesium levels (fast, irregular heartbeat; muscle cramp or pain; muscle weakness; tremors; seizures) low red blood cell counts (trouble breathing; feeling faint; lightheaded, falls; unusually weak or tired) muscle pain redness, blistering, peeling, or loosening of the skin, including inside the mouth severe diarrhea swelling of the ankles, feet, hands trouble breathing Side effects that usually do not require medical attention (report to yourdoctor or health care provider if they continue or are bothersome): anxious constipation coughing depressed mood eye irritation, itching, or pain fever general ill feeling or  flu-like symptoms nausea pain, redness, or irritation at site where injected trouble sleeping This list may not describe all possible side effects. Call your doctor for medical advice about side effects. You may report side effects to FDA at1-800-FDA-1088. Where should I keep my medication? This drug is given in a hospital or clinic. It will not be stored at home. NOTE: This sheet is a summary. It may not cover all possible information. If you have questions about this medicine, talk to your doctor, pharmacist, orhealth care provider.  2022 Elsevier/Gold Standard (2019-04-04 09:13:00)    To help prevent nausea and vomiting after your treatment, we encourage you to take your nausea medication as directed.  BELOW ARE SYMPTOMS THAT SHOULD BE REPORTED IMMEDIATELY: *FEVER GREATER THAN 100.4 F (38 C) OR HIGHER *CHILLS OR SWEATING *NAUSEA AND VOMITING THAT IS NOT CONTROLLED WITH YOUR NAUSEA MEDICATION *UNUSUAL SHORTNESS OF BREATH *UNUSUAL BRUISING OR BLEEDING *URINARY PROBLEMS (pain or burning when urinating, or frequent urination) *BOWEL PROBLEMS (unusual diarrhea, constipation, pain near the anus) TENDERNESS IN MOUTH AND THROAT WITH OR WITHOUT PRESENCE OF ULCERS (sore throat, sores in mouth, or a toothache) UNUSUAL RASH, SWELLING OR PAIN  UNUSUAL VAGINAL DISCHARGE OR ITCHING   Items with * indicate a potential emergency and should be followed up as soon as possible or go to the Emergency Department if any problems should occur.  Please show the  CHEMOTHERAPY ALERT CARD or IMMUNOTHERAPY ALERT CARD at check-in to the Emergency Department and triage nurse.  Should you have questions after your visit or need to cancel or reschedule your appointment, please contact [facility]  Dept: [phone]  and follow the prompts.  Office hours are 8:00 a.m. to 4:30 p.m. Monday - Friday. Please note that voicemails left after 4:00 p.m. may not be returned until the following  business day.  We are closed weekends and major holidays. You have access to a nurse at all times for urgent questions. Please call the main number to the clinic Dept: [phone] and follow the prompts.   For any non-urgent questions, you may also contact your provider using MyChart. We now offer e-Visits for anyone 20 and older to request care online for non-urgent symptoms. For details visit [email or web address].   Also download the MyChart app! Go to the app store, search "MyChart", open the app, select [facility], and log in with your MyChart username and password.  Due to Covid, a mask is required upon entering the hospital/clinic. If you do not have a mask, one will be given to you upon arrival. For doctor visits, patients may have 1 support person aged 29 or older with them. For treatment visits, patients cannot have anyone with them due to current Covid guidelines and our immunocompromised population.

## 2021-02-15 LAB — KAPPA/LAMBDA LIGHT CHAINS
Kappa free light chain: 8.6 mg/L (ref 3.3–19.4)
Kappa, lambda light chain ratio: 1.23 (ref 0.26–1.65)
Lambda free light chains: 7 mg/L (ref 5.7–26.3)

## 2021-02-15 LAB — IMMUNOFIXATION ELECTROPHORESIS
IgA: 13 mg/dL — ABNORMAL LOW (ref 87–352)
IgG (Immunoglobin G), Serum: 340 mg/dL — ABNORMAL LOW (ref 586–1602)
IgM (Immunoglobulin M), Srm: 10 mg/dL — ABNORMAL LOW (ref 26–217)
Total Protein ELP: 5.2 g/dL — ABNORMAL LOW (ref 6.0–8.5)

## 2021-02-16 ENCOUNTER — Encounter: Payer: Self-pay | Admitting: Oncology

## 2021-02-17 ENCOUNTER — Telehealth: Payer: Self-pay | Admitting: Nurse Practitioner

## 2021-02-17 NOTE — Telephone Encounter (Signed)
I called Katherine Beard and reviewed the lab results from 02/12/2021.  Questions answered.  She will follow-up as scheduled.

## 2021-02-18 ENCOUNTER — Ambulatory Visit: Payer: PRIVATE HEALTH INSURANCE | Admitting: Psychologist

## 2021-02-27 ENCOUNTER — Other Ambulatory Visit: Payer: Self-pay | Admitting: Oncology

## 2021-03-03 ENCOUNTER — Other Ambulatory Visit: Payer: Self-pay | Admitting: Nurse Practitioner

## 2021-03-03 DIAGNOSIS — C9000 Multiple myeloma not having achieved remission: Secondary | ICD-10-CM

## 2021-03-03 MED ORDER — OXYCODONE HCL 5 MG PO TABS: 5.0000 mg | ORAL_TABLET | ORAL | 0 refills | Status: DC | PRN

## 2021-03-03 NOTE — Telephone Encounter (Signed)
Refill

## 2021-03-04 ENCOUNTER — Other Ambulatory Visit: Payer: Self-pay

## 2021-03-04 ENCOUNTER — Ambulatory Visit (INDEPENDENT_AMBULATORY_CARE_PROVIDER_SITE_OTHER): Payer: PRIVATE HEALTH INSURANCE | Admitting: Psychologist

## 2021-03-04 DIAGNOSIS — F321 Major depressive disorder, single episode, moderate: Secondary | ICD-10-CM

## 2021-03-07 ENCOUNTER — Other Ambulatory Visit: Payer: Self-pay | Admitting: Oncology

## 2021-03-07 DIAGNOSIS — C9000 Multiple myeloma not having achieved remission: Secondary | ICD-10-CM

## 2021-03-09 ENCOUNTER — Encounter: Payer: Self-pay | Admitting: Nurse Practitioner

## 2021-03-09 ENCOUNTER — Other Ambulatory Visit: Payer: Self-pay

## 2021-03-09 DIAGNOSIS — C9000 Multiple myeloma not having achieved remission: Secondary | ICD-10-CM

## 2021-03-09 MED ORDER — ESCITALOPRAM OXALATE 5 MG PO TABS: 5.0000 mg | ORAL_TABLET | Freq: Every day | ORAL | 0 refills | Status: DC

## 2021-03-09 MED ORDER — METHOCARBAMOL 500 MG PO TABS: ORAL_TABLET | ORAL | 1 refills | Status: DC

## 2021-03-09 NOTE — Progress Notes (Signed)
Pt called in to request refill robaxin and lexapro/completed sent to [facility]

## 2021-03-10 ENCOUNTER — Telehealth: Payer: Self-pay

## 2021-03-10 NOTE — Telephone Encounter (Signed)
Authorization number# [id number] for Revlimid for the month of September. Meds on hold for Stem cell procedure at [facility] on 03/29/21

## 2021-03-16 ENCOUNTER — Encounter: Payer: Self-pay | Admitting: Nurse Practitioner

## 2021-03-23 ENCOUNTER — Other Ambulatory Visit: Payer: Self-pay | Admitting: *Deleted

## 2021-03-23 ENCOUNTER — Encounter: Payer: Self-pay | Admitting: Oncology

## 2021-03-23 MED ORDER — LENALIDOMIDE 10 MG PO CAPS: 10.0000 mg | ORAL_CAPSULE | Freq: Every day | ORAL | 0 refills | Status: DC

## 2021-03-31 ENCOUNTER — Inpatient Hospital Stay: Payer: 59 | Attending: Oncology

## 2021-03-31 ENCOUNTER — Inpatient Hospital Stay (HOSPITAL_BASED_OUTPATIENT_CLINIC_OR_DEPARTMENT_OTHER): Payer: 59 | Admitting: Oncology

## 2021-03-31 ENCOUNTER — Other Ambulatory Visit: Payer: Self-pay

## 2021-03-31 VITALS — BP 139/85 | HR 57 | Temp 98.1°F | Resp 18 | Ht 65.0 in | Wt 168.4 lb

## 2021-03-31 DIAGNOSIS — C9000 Multiple myeloma not having achieved remission: Secondary | ICD-10-CM | POA: Insufficient documentation

## 2021-03-31 LAB — CBC WITH DIFFERENTIAL (CANCER CENTER ONLY)
Abs Immature Granulocytes: 0.52 10*3/uL — ABNORMAL HIGH (ref 0.00–0.07)
Basophils Absolute: 0 10*3/uL (ref 0.0–0.1)
Basophils Relative: 1 %
Eosinophils Absolute: 0.1 10*3/uL (ref 0.0–0.5)
Eosinophils Relative: 1 %
HCT: 37.7 % (ref 36.0–46.0)
Hemoglobin: 12.7 g/dL (ref 12.0–15.0)
Immature Granulocytes: 8 %
Lymphocytes Relative: 9 %
Lymphs Abs: 0.6 10*3/uL — ABNORMAL LOW (ref 0.7–4.0)
MCH: 31.4 pg (ref 26.0–34.0)
MCHC: 33.7 g/dL (ref 30.0–36.0)
MCV: 93.1 fL (ref 80.0–100.0)
Monocytes Absolute: 0.8 10*3/uL (ref 0.1–1.0)
Monocytes Relative: 12 %
Neutro Abs: 4.5 10*3/uL (ref 1.7–7.7)
Neutrophils Relative %: 69 %
Platelet Count: 86 10*3/uL — ABNORMAL LOW (ref 150–400)
RBC: 4.05 MIL/uL (ref 3.87–5.11)
RDW: 13.4 % (ref 11.5–15.5)
Smear Review: NORMAL
WBC Count: 6.5 10*3/uL (ref 4.0–10.5)
nRBC: 0 % (ref 0.0–0.2)

## 2021-03-31 LAB — CMP (CANCER CENTER ONLY)
ALT: 10 U/L (ref 0–44)
AST: 13 U/L — ABNORMAL LOW (ref 15–41)
Albumin: 4.1 g/dL (ref 3.5–5.0)
Alkaline Phosphatase: 73 U/L (ref 38–126)
Anion gap: 7 (ref 5–15)
BUN: 13 mg/dL (ref 6–20)
CO2: 29 mmol/L (ref 22–32)
Calcium: 9.2 mg/dL (ref 8.9–10.3)
Chloride: 103 mmol/L (ref 98–111)
Creatinine: 0.77 mg/dL (ref 0.44–1.00)
GFR, Estimated: >60 mL/min (ref >60)
Glucose, Bld: 104 mg/dL — ABNORMAL HIGH (ref 70–99)
Potassium: 4.2 mmol/L (ref 3.5–5.1)
Sodium: 139 mmol/L (ref 135–145)
Total Bilirubin: 0.3 mg/dL (ref 0.3–1.2)
Total Protein: 5.7 g/dL — ABNORMAL LOW (ref 6.5–8.1)

## 2021-03-31 NOTE — Progress Notes (Signed)
Katherine Beard OFFICE PROGRESS NOTE   Diagnosis: Multiple myeloma  INTERVAL HISTORY:   Katherine Beard returns as scheduled.  She underwent stem cell harvest at [facility] on 03/29/2021.  She reports tolerating the procedure well.  She has a bruise the left arm IV site.  She noted improvement in back pain when taking Claritin while on G-CSF.  Katherine Beard reports generalized improvement in her back pain.  She is now taking oxycodone and [place] carbamoyl twice daily.  She has returned to work full-time.  Depression symptoms have improved. She reports a sore throat and sinus drainage for the past few days.  No fever or cough.  She relates the symptoms to allergies. Objective:  Vital signs in last 24 hours:  Blood pressure 139/85, pulse (!) 57, temperature 98.1 F (36.7 C), temperature source Oral, resp. rate 18, height 5' 5"  (1.651 m), weight 168 lb 6.4 oz (76.4 kg), last menstrual period 03/06/2011, SpO2 100 %.    HEENT: No thrush, geographic tongue Resp: Lungs clear bilaterally Cardio: Regular rate and rhythm GI: No hepatosplenomegaly, small ecchymoses at lower abdomen injection sites, mild bilateral abdominal tenderness Vascular: No leg edema  Skin: Resolving ecchymosis of the left forearm  Lab Results:  Lab Results  Component Value Date   WBC 6.5 03/31/2021   HGB 12.7 03/31/2021   HCT 37.7 03/31/2021   MCV 93.1 03/31/2021   PLT 86 (L) 03/31/2021   NEUTROABS 4.5 03/31/2021    CMP  Lab Results  Component Value Date   NA 139 03/31/2021   K 4.2 03/31/2021   CL 103 03/31/2021   CO2 29 03/31/2021   GLUCOSE 104 (H) 03/31/2021   BUN 13 03/31/2021   CREATININE 0.77 03/31/2021   CALCIUM 9.2 03/31/2021   PROT 5.7 (L) 03/31/2021   ALBUMIN 4.1 03/31/2021   AST 13 (L) 03/31/2021   ALT 10 03/31/2021   ALKPHOS 73 03/31/2021   BILITOT 0.3 03/31/2021   GFRNONAA >60 03/31/2021   GFRAA 78 12/11/2006    Medications: I have reviewed the patient's current  medications.   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 Increase serum free lambda light chains Serum M spike MRI pelvis 09/01/2020-innumerable small foci of signal abnormality concerning for multiple myeloma involvement Metastatic bone survey 09/03/2020-no discrete lytic bone lesions, compression fractures at T12, L1, L2, and L3, osteopenia Cycle 1 RVD 09/14/2020 Daratumumab beginning 09/28/2020 Cycle 2 RVD 10/05/2020 plus daratumumab Week 3 daratumumab 10/12/2020 Normal lambda light chains 10/26/2020 Cycle 3 RVD plus daratumumab 10/26/2020 Cycle 4 RVD plus daratumumab 11/16/2020, daratumumab changed to an every 2-week schedule beginning 11/23/2020 Cycle 5 RVD plus daratumumab 12/07/2020 Cycle 6 RVD plus daratumumab 12/28/2020 Normal lambda light chains 01/05/2021 Revlimid maintenance 21/28 days beginning 01/18/2021 Stem cell harvest at [facility] 03/29/2021 Revlimid resumed 03/30/2021   2. Severe back pain MRI lumbar spine July 12, 2020-subacute compression fractures at T12 and L1 MRI lumbar spine July 30, 2020-subacute T12 and L1 compression fractures, new subacute superior L2 endplate deformity MRI lumbar spine September 01, 2020 new/progressive compression fractures at L2 and L3, acute compression fracture at L4, evolution of T12 and L1 compression fractures MRI pelvis September 01, 2020-innumerable small foci of signal abnormality in the pelvis and femurs, no fracture identified, concerning for multiple myeloma versus metastases Zometa beginning 09/21/2020-plan monthly x3 then every 3 months, next due August 2022 MRI thoracic spine  July 2022-new T3 compression fracture with multiple additional thoracic compression fractures 3. Melanoma left upper back 2014 4. History of diverticulitis 5. Mild hypercalcemia at presentation-resolved 6.  Osteoporosis 7. Evusheld 8.  Depression-worsened depression symptoms 01/11/2021, referred to psychology      Disposition: Katherine Beard appears stable.  She resumed Revlimid maintenance on 03/30/2021 after completing stem cell harvest on 03/29/2021.  The platelets are mildly decreased today.  She is seeing [name] on 04/06/2021.  We will follow-up on the CBC from that day.  She will return for an office visit in 1 month.  She continues every 3 months Zometa.  [name], MD  03/31/2021  4:18 PM

## 2021-04-01 ENCOUNTER — Encounter: Payer: Self-pay | Admitting: Oncology

## 2021-04-01 LAB — KAPPA/LAMBDA LIGHT CHAINS
Kappa free light chain: 7.6 mg/L (ref 3.3–19.4)
Kappa, lambda light chain ratio: 2.05 — ABNORMAL HIGH (ref 0.26–1.65)
Lambda free light chains: 3.7 mg/L — ABNORMAL LOW (ref 5.7–26.3)

## 2021-04-06 ENCOUNTER — Other Ambulatory Visit: Payer: Self-pay | Admitting: Nurse Practitioner

## 2021-04-07 ENCOUNTER — Other Ambulatory Visit: Payer: Self-pay | Admitting: Nurse Practitioner

## 2021-04-07 DIAGNOSIS — C9000 Multiple myeloma not having achieved remission: Secondary | ICD-10-CM

## 2021-04-08 ENCOUNTER — Ambulatory Visit: Payer: PRIVATE HEALTH INSURANCE | Admitting: Psychologist

## 2021-04-12 ENCOUNTER — Other Ambulatory Visit: Payer: Self-pay

## 2021-04-12 DIAGNOSIS — C9000 Multiple myeloma not having achieved remission: Secondary | ICD-10-CM

## 2021-04-12 MED ORDER — DIAZEPAM 5 MG PO TABS: 5.0000 mg | ORAL_TABLET | Freq: Two times a day (BID) | ORAL | 0 refills | Status: DC | PRN

## 2021-04-18 ENCOUNTER — Other Ambulatory Visit: Payer: Self-pay | Admitting: Oncology

## 2021-04-19 ENCOUNTER — Other Ambulatory Visit: Payer: Self-pay | Admitting: Nurse Practitioner

## 2021-04-19 ENCOUNTER — Other Ambulatory Visit: Payer: Self-pay | Admitting: Oncology

## 2021-04-19 DIAGNOSIS — C9000 Multiple myeloma not having achieved remission: Secondary | ICD-10-CM

## 2021-04-19 MED ORDER — LENALIDOMIDE 10 MG PO CAPS: 10.0000 mg | ORAL_CAPSULE | Freq: Every day | ORAL | 0 refills | Status: DC

## 2021-04-19 MED ORDER — OXYCODONE HCL 5 MG PO TABS: 5.0000 mg | ORAL_TABLET | ORAL | 0 refills | Status: DC | PRN

## 2021-04-20 ENCOUNTER — Encounter: Payer: Self-pay | Admitting: Oncology

## 2021-04-26 ENCOUNTER — Other Ambulatory Visit: Payer: Self-pay

## 2021-04-26 ENCOUNTER — Inpatient Hospital Stay: Payer: 59

## 2021-04-26 ENCOUNTER — Encounter: Payer: Self-pay | Admitting: Oncology

## 2021-04-26 ENCOUNTER — Inpatient Hospital Stay: Payer: 59 | Attending: Oncology | Admitting: Oncology

## 2021-04-26 VITALS — BP 129/84 | HR 60 | Temp 98.7°F | Resp 18 | Ht 65.0 in | Wt 168.0 lb

## 2021-04-26 DIAGNOSIS — C9000 Multiple myeloma not having achieved remission: Secondary | ICD-10-CM

## 2021-04-26 DIAGNOSIS — M549 Dorsalgia, unspecified: Secondary | ICD-10-CM | POA: Diagnosis not present

## 2021-04-26 DIAGNOSIS — Z85828 Personal history of other malignant neoplasm of skin: Secondary | ICD-10-CM | POA: Diagnosis not present

## 2021-04-26 DIAGNOSIS — Z79899 Other long term (current) drug therapy: Secondary | ICD-10-CM | POA: Insufficient documentation

## 2021-04-26 DIAGNOSIS — Z9221 Personal history of antineoplastic chemotherapy: Secondary | ICD-10-CM | POA: Insufficient documentation

## 2021-04-26 DIAGNOSIS — F32A Depression, unspecified: Secondary | ICD-10-CM | POA: Diagnosis not present

## 2021-04-26 LAB — CBC WITH DIFFERENTIAL (CANCER CENTER ONLY)
Abs Immature Granulocytes: 0 10*3/uL (ref 0.00–0.07)
Basophils Absolute: 0.1 10*3/uL (ref 0.0–0.1)
Basophils Relative: 2 %
Eosinophils Absolute: 0.1 10*3/uL (ref 0.0–0.5)
Eosinophils Relative: 2 %
HCT: 42 % (ref 36.0–46.0)
Hemoglobin: 13.9 g/dL (ref 12.0–15.0)
Immature Granulocytes: 0 %
Lymphocytes Relative: 20 %
Lymphs Abs: 0.7 10*3/uL (ref 0.7–4.0)
MCH: 30.8 pg (ref 26.0–34.0)
MCHC: 33.1 g/dL (ref 30.0–36.0)
MCV: 93.1 fL (ref 80.0–100.0)
Monocytes Absolute: 0.4 10*3/uL (ref 0.1–1.0)
Monocytes Relative: 11 %
Neutro Abs: 2.3 10*3/uL (ref 1.7–7.7)
Neutrophils Relative %: 65 %
Platelet Count: 229 10*3/uL (ref 150–400)
RBC: 4.51 MIL/uL (ref 3.87–5.11)
RDW: 13.6 % (ref 11.5–15.5)
WBC Count: 3.4 10*3/uL — ABNORMAL LOW (ref 4.0–10.5)
nRBC: 0 % (ref 0.0–0.2)

## 2021-04-26 LAB — CMP (CANCER CENTER ONLY)
ALT: 12 U/L (ref 0–44)
AST: 13 U/L — ABNORMAL LOW (ref 15–41)
Albumin: 4.2 g/dL (ref 3.5–5.0)
Alkaline Phosphatase: 30 U/L — ABNORMAL LOW (ref 38–126)
Anion gap: 6 (ref 5–15)
BUN: 21 mg/dL — ABNORMAL HIGH (ref 6–20)
CO2: 29 mmol/L (ref 22–32)
Calcium: 9.2 mg/dL (ref 8.9–10.3)
Chloride: 101 mmol/L (ref 98–111)
Creatinine: 0.81 mg/dL (ref 0.44–1.00)
GFR, Estimated: >60 mL/min (ref >60)
Glucose, Bld: 91 mg/dL (ref 70–99)
Potassium: 4.3 mmol/L (ref 3.5–5.1)
Sodium: 136 mmol/L (ref 135–145)
Total Bilirubin: 0.5 mg/dL (ref 0.3–1.2)
Total Protein: 6 g/dL — ABNORMAL LOW (ref 6.5–8.1)

## 2021-04-26 NOTE — Progress Notes (Signed)
[facility] OFFICE PROGRESS NOTE   Diagnosis: Multiple myeloma  INTERVAL HISTORY:   Katherine Beard returns as scheduled.  She continues Revlimid maintenance.  Occasional diarrhea.  No neuropathy symptoms.  She feels well.  She is working.  She has started exercising more and has noted increased back pain.  She saw Dr. Vertell Limber and reports her exercise is now limited to walking, lifting a small amount of weight, and water exercise.    Objective:  Vital signs in last 24 hours:  Blood pressure 129/84, pulse 60, temperature 98.7 F (37.1 C), temperature source Oral, resp. rate 18, height 5' 5"  (1.651 m), weight 158 lb 3.2 oz (71.8 kg), last menstrual period 03/06/2011, SpO2 100 %.    HEENT: No thrush, geographic tongue Resp: Lungs clear bilaterally Cardio: Regular rate and rhythm GI: No hepatosplenomegaly Vascular: No leg edema   Lab Results:  Lab Results  Component Value Date   WBC 3.4 (L) 04/26/2021   HGB 13.9 04/26/2021   HCT 42.0 04/26/2021   MCV 93.1 04/26/2021   PLT 229 04/26/2021   NEUTROABS 2.3 04/26/2021    CMP  Lab Results  Component Value Date   NA 139 03/31/2021   K 4.2 03/31/2021   CL 103 03/31/2021   CO2 29 03/31/2021   GLUCOSE 104 (H) 03/31/2021   BUN 13 03/31/2021   CREATININE 0.77 03/31/2021   CALCIUM 9.2 03/31/2021   PROT 5.7 (L) 03/31/2021   ALBUMIN 4.1 03/31/2021   AST 13 (L) 03/31/2021   ALT 10 03/31/2021   ALKPHOS 73 03/31/2021   BILITOT 0.3 03/31/2021   GFRNONAA >60 03/31/2021   GFRAA 78 12/11/2006    Medications: I have reviewed the patient's current medications.   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 Increase serum free lambda light chains Serum M spike MRI pelvis 09/01/2020-innumerable small foci of signal abnormality concerning for multiple  myeloma involvement Metastatic bone survey 09/03/2020-no discrete lytic bone lesions, compression fractures at T12, L1, L2, and L3, osteopenia Cycle 1 RVD 09/14/2020 Daratumumab beginning 09/28/2020 Cycle 2 RVD 10/05/2020 plus daratumumab Week 3 daratumumab 10/12/2020 Normal lambda light chains 10/26/2020 Cycle 3 RVD plus daratumumab 10/26/2020 Cycle 4 RVD plus daratumumab 11/16/2020, daratumumab changed to an every 2-week schedule beginning 11/23/2020 Cycle 5 RVD plus daratumumab 12/07/2020 Cycle 6 RVD plus daratumumab 12/28/2020 Normal lambda light chains 01/05/2021 Revlimid maintenance 21/28 days beginning 01/18/2021 Stem cell harvest at [facility] 03/29/2021 Revlimid resumed 03/30/2021   2. Severe back pain MRI lumbar spine July 12, 2020-subacute compression fractures at T12 and L1 MRI lumbar spine July 30, 2020-subacute T12 and L1 compression fractures, new subacute superior L2 endplate deformity MRI lumbar spine September 01, 2020 new/progressive compression fractures at L2 and L3, acute compression fracture at L4, evolution of T12 and L1 compression fractures MRI pelvis September 01, 2020-innumerable small foci of signal abnormality in the pelvis and femurs, no fracture identified, concerning for multiple myeloma versus metastases Zometa beginning 09/21/2020-plan monthly x3 then every 3 months, next due August 2022 MRI thoracic spine July 2022-new T3 compression fracture with multiple additional thoracic compression fractures 3. Melanoma left upper back 2014 4. History of diverticulitis 5. Mild hypercalcemia at presentation-resolved 6. Osteoporosis 7. Evusheld 8.  Depression-worsened depression symptoms 01/11/2021, referred to psychology       Disposition: Katherine Beard appears stable.  She will begin another cycle of Revlimid today.  She will return for an office visit and Zometa in 1 month.  She has received Evusheld and plans to obtain an influenza vaccine and COVID-19 booster within the next few  weeks.  [name], MD  04/26/2021  8:22 AM

## 2021-04-27 LAB — KAPPA/LAMBDA LIGHT CHAINS
Kappa free light chain: 8.2 mg/L (ref 3.3–19.4)
Kappa, lambda light chain ratio: 1.14 (ref 0.26–1.65)
Lambda free light chains: 7.2 mg/L (ref 5.7–26.3)

## 2021-04-29 ENCOUNTER — Other Ambulatory Visit: Payer: Self-pay

## 2021-04-29 DIAGNOSIS — C9000 Multiple myeloma not having achieved remission: Secondary | ICD-10-CM

## 2021-04-29 NOTE — Progress Notes (Signed)
Pt request for Aquatic therapy - ambulatory referral for [facility] physical therapy specifically Aquatic therapy placed and sent

## 2021-05-07 ENCOUNTER — Encounter: Payer: Self-pay | Admitting: Family

## 2021-05-07 NOTE — Telephone Encounter (Signed)
Okay for pt to transfer to [facility]?

## 2021-05-10 ENCOUNTER — Encounter: Payer: Self-pay | Admitting: Oncology

## 2021-05-10 ENCOUNTER — Other Ambulatory Visit: Payer: Self-pay | Admitting: Nurse Practitioner

## 2021-05-10 ENCOUNTER — Other Ambulatory Visit: Payer: Self-pay

## 2021-05-10 DIAGNOSIS — C9000 Multiple myeloma not having achieved remission: Secondary | ICD-10-CM

## 2021-05-10 MED ORDER — METHOCARBAMOL 500 MG PO TABS: ORAL_TABLET | ORAL | 1 refills | Status: DC

## 2021-05-11 ENCOUNTER — Other Ambulatory Visit: Payer: Self-pay | Admitting: Nurse Practitioner

## 2021-05-11 ENCOUNTER — Encounter: Payer: Self-pay | Admitting: Oncology

## 2021-05-11 MED ORDER — ESCITALOPRAM OXALATE 5 MG PO TABS
5.0000 mg | ORAL_TABLET | Freq: Every day | ORAL | 3 refills | Status: DC
Start: 2021-05-11 — End: 2021-06-09

## 2021-05-12 ENCOUNTER — Encounter: Payer: Self-pay | Admitting: Oncology

## 2021-05-12 ENCOUNTER — Other Ambulatory Visit: Payer: Self-pay

## 2021-05-12 ENCOUNTER — Ambulatory Visit (INDEPENDENT_AMBULATORY_CARE_PROVIDER_SITE_OTHER): Payer: 59 | Admitting: Internal Medicine

## 2021-05-12 ENCOUNTER — Ambulatory Visit (HOSPITAL_BASED_OUTPATIENT_CLINIC_OR_DEPARTMENT_OTHER): Payer: 59 | Attending: Nurse Practitioner | Admitting: Physical Therapy

## 2021-05-12 ENCOUNTER — Encounter: Payer: Self-pay | Admitting: Internal Medicine

## 2021-05-12 VITALS — BP 118/70 | HR 61 | Resp 18 | Ht 63.5 in | Wt 168.2 lb

## 2021-05-12 DIAGNOSIS — E782 Mixed hyperlipidemia: Secondary | ICD-10-CM | POA: Diagnosis not present

## 2021-05-12 DIAGNOSIS — R7309 Other abnormal glucose: Secondary | ICD-10-CM

## 2021-05-12 DIAGNOSIS — M5416 Radiculopathy, lumbar region: Secondary | ICD-10-CM

## 2021-05-12 DIAGNOSIS — G8929 Other chronic pain: Secondary | ICD-10-CM | POA: Insufficient documentation

## 2021-05-12 DIAGNOSIS — Z0000 Encounter for general adult medical examination without abnormal findings: Secondary | ICD-10-CM

## 2021-05-12 DIAGNOSIS — R2681 Unsteadiness on feet: Secondary | ICD-10-CM | POA: Diagnosis not present

## 2021-05-12 DIAGNOSIS — R2689 Other abnormalities of gait and mobility: Secondary | ICD-10-CM | POA: Diagnosis not present

## 2021-05-12 DIAGNOSIS — C9000 Multiple myeloma not having achieved remission: Secondary | ICD-10-CM

## 2021-05-12 DIAGNOSIS — M25561 Pain in right knee: Secondary | ICD-10-CM | POA: Insufficient documentation

## 2021-05-12 DIAGNOSIS — R293 Abnormal posture: Secondary | ICD-10-CM | POA: Diagnosis not present

## 2021-05-12 DIAGNOSIS — Z0001 Encounter for general adult medical examination with abnormal findings: Secondary | ICD-10-CM

## 2021-05-12 DIAGNOSIS — M6281 Muscle weakness (generalized): Secondary | ICD-10-CM | POA: Diagnosis present

## 2021-05-12 DIAGNOSIS — R262 Difficulty in walking, not elsewhere classified: Secondary | ICD-10-CM | POA: Diagnosis present

## 2021-05-12 DIAGNOSIS — M546 Pain in thoracic spine: Secondary | ICD-10-CM | POA: Diagnosis present

## 2021-05-12 DIAGNOSIS — M545 Low back pain, unspecified: Secondary | ICD-10-CM | POA: Diagnosis present

## 2021-05-12 NOTE — Patient Instructions (Signed)
I would consider getting the prevnar 20 and the shingrix vaccine.

## 2021-05-12 NOTE — Progress Notes (Signed)
   Subjective:   Patient ID: Katherine Beard, female    DOB: June 25, 1961, 60 y.o.   MRN: [id number]  HPI The patient is a 60 YO female coming in for transfer of care and discussion about ongoing treatment.   Also needs physical.   PMH, [place], social history reviewed and updated  Review of Systems  Constitutional:  Positive for activity change. Negative for appetite change, chills, fatigue, fever and unexpected weight change.  HENT: Negative.    Eyes: Negative.   Respiratory: Negative.  Negative for cough, chest tightness and shortness of breath.   Cardiovascular: Negative.  Negative for chest pain, palpitations and leg swelling.  Gastrointestinal: Negative.  Negative for abdominal distention, abdominal pain, constipation, diarrhea, nausea and vomiting.  Musculoskeletal:  Positive for arthralgias, back pain and myalgias. Negative for gait problem and joint swelling.  Skin: Negative.   Neurological: Negative.   Psychiatric/Behavioral: Negative.     Objective:  Physical Exam Constitutional:      Appearance: She is well-developed.  HENT:     Head: Normocephalic and atraumatic.  Cardiovascular:     Rate and Rhythm: Normal rate and regular rhythm.  Pulmonary:     Effort: Pulmonary effort is normal. No respiratory distress.     Breath sounds: Normal breath sounds. No wheezing or rales.  Abdominal:     General: Bowel sounds are normal. There is no distension.     Palpations: Abdomen is soft.     Tenderness: There is no abdominal tenderness. There is no rebound.  Musculoskeletal:        General: Tenderness present.     Cervical back: Normal range of motion.  Skin:    General: Skin is warm and dry.  Neurological:     Mental Status: She is alert and oriented to person, place, and time.     Coordination: Coordination normal.    Vitals:   05/12/21 0932  BP: 118/70  Pulse: 61  Resp: 18  SpO2: 98%  Weight: 168 lb 3.2 oz (76.3 kg)  Height: 5' 3.5" (1.613 m)    This visit occurred  during the SARS-CoV-2 public health emergency.  Safety protocols were in place, including screening questions prior to the visit, additional usage of staff PPE, and extensive cleaning of exam room while observing appropriate contact time as indicated for disinfecting solutions.   Assessment & Plan:

## 2021-05-12 NOTE — Therapy (Signed)
OUTPATIENT PHYSICAL THERAPY THORACOLUMBAR EVALUATION   Patient Name: Katherine Beard MRN: [id number] DOB:13-Jun-1961, 60 y.o., female Today's Date: 05/13/2021   PT End of Session - 05/12/21 1638     Visit Number 1    Number of Visits 12    Date for PT Re-Evaluation 06/23/21    PT Start Time 45    PT Stop Time 1622    PT Time Calculation (min) 58 min    Activity Tolerance Patient tolerated treatment well    Behavior During Therapy WFL for tasks assessed/performed             Past Medical History:  Diagnosis Date   Acne    DIVERTICULITIS, HX OF    Epicondylitis syndrome of elbow    RIGHT    GERD (gastroesophageal reflux disease)    Hepatic cyst 2015   per MRI   HYPERGLYCEMIA    HYPERLIPIDEMIA    Hyperplastic colon polyp    Iliotibial band syndrome of right side    Liver cyst 2014   Melanoma ([place]) 2014   Excised -clear margins   OBESITY    Palpitations    Vitamin D deficiency    Past Surgical History:  Procedure Laterality Date   COLECTOMY  09-16-03   sigmoid   COLONOSCOPY  2014   COLONOSCOPY  84/66/5993   UMBILICAL LAPRPOSCOPY     AGE 8   Patient Active Problem List   Diagnosis Date Noted   Multiple myeloma ([place]) 09/08/2020   Goals of care, counseling/discussion 09/08/2020   Lumbar radiculopathy 07/08/2020   Encounter for counseling 02/04/2020   Sciatica 02/16/2011   Pyriformis syndrome 02/15/2011   Palpitations 11/16/2010   Paresthesia 11/16/2010   OBESITY 08/21/2009   HYPERGLYCEMIA 02/26/2007   Hyperlipidemia 02/20/2007   DIVERTICULITIS, HX OF 12/20/2006    PCP: Hoyt Koch, MD  REFERRING PROVIDER: Owens Shark, NP  REFERRING DIAG: C90.00 (ICD-10-CM) - Multiple myeloma, remission status unspecified   THERAPY DIAG:  Low back pain, unspecified back pain laterality, unspecified chronicity, unspecified whether sciatica present  Pain in thoracic spine  Muscle weakness (generalized)  Difficulty in walking, not elsewhere  classified  ONSET DATE: 06/05/2020; Exacerbation in January 2022  SUBJECTIVE:                                                                                                                                                                                           SUBJECTIVE STATEMENT: -Pt had her first fall on 06/05/2020 having 2 compression fractures.  Pt had multiple falls in January due to her back seizing which caused further compression fractures.  Pt had a bone  marrow biopsy on 09/02/20 and was diagnosed with mutliple myeloma.   Pt reports her muscles in her back were seizing up due to the lesions in her spine and hips.  Pt states she couldn't walk for at least 3 months.  She gradually progressed her walking by using a walker and a cane.  Pt received land based PT at [facility] around May - July.  Pt reports improved function. Pt underwent chemo/infusion treatments.  Pt had a bone marrow biopsy in September at [facility] which confirmed she is in complete remission.   - Pt has to take oxycodone and methocarbomal everyday due to pain.  Pt has been walking a lot and eating protein and calcium rich foods.  Pt uses a brace intermittently.  MD informed her to walk and ordered aquatic therapy.  MD also informed pt to not lift anything > 10#.   -Pt reports her kyphosis causes postural deficits including flexing forward.  Pt has increased pain when she tries to straighten up.  Pt states she has difficulty and pain with all ADLs/IADLs including cooking/preparing food, dressing, bathing, standing to blow dry her hair, and household chores including washing dishes/laundry.  Pt has pain with sitting at work, car transfers.  Significant pain when leaning forward which takes her breath away.  Pt feels her bones crunching with movement including reaching into cabinet.  PERTINENT HISTORY:  Compression fractures at T3, T9-T12, and L1-L5.  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  MD instructed her to not perform  resistance machines and she has a 10# lifting restriction  PAIN:  Are you having pain? Yes NPRS scale: 6-7/10 current, 3/10 best, 10/10 worst Pain location: thoracic and lumbar.  Bilat IC Pain description: constant  Aggravating factors: Leaning forward, sitting, standing, extending from a flexed position Relieving factors: lying down with pillow propped under legs and arms, heat, ice, medications, CBD roll-on  PRECAUTIONS: Other: Multiple Compression fractures, Osteoporosis  WEIGHT BEARING RESTRICTIONS  Pt has a 10# lifting restriction.  FALLS:  Has patient fallen in last 6 months? No    OCCUPATION: Works for a [employer].  Pt sits for the majority of her work activities.   PLOF: Independent.  Pt was able to perform all of her ADLs/IADLs and functional mobility skills independently without difficulty and pain.    PATIENT GOALS to be pain free, more flexibility   OBJECTIVE:   DIAGNOSTIC FINDINGS:  -MRI lumbar spine July 12, 2020-subacute compression fractures at T12 and L1- -MRI lumbar spine July 30, 2020-subacute T12 and L1 compression fractures, new subacute superior L2 endplate deformity -MRI lumbar spine September 01, 2020 new/progressive compression fractures at L2 and L3, acute compression fracture at L4, evolution of T12 and L1 compression fractures -MRI pelvis September 01, 2020-innumerable small foci of signal abnormality in the pelvis and femurs, no fracture identified, concerning for multiple myeloma versus metastases -MRI thoracic spine July 2022-new T3 compression fracture with multiple additional thoracic compression fractures.  -See MRI on 02/01/2021 under imaging in Epic for details concerning thoracic and lumbar compression fractures and disc bulges (All found in Epic)  -Pt states she had a MRI in September which showed compression fractures at T3, T9-T12, and L1-L5.   PATIENT SURVEYS:  Modified Oswestry 44%   SCREENING FOR RED FLAGS:   Compression  fracture: Yes: T3, T9-T12, and L1-L5   COGNITION:  Overall cognitive status: Within functional limits for tasks assessed       POSTURE:  Increased kyphosis with rounded shoulders.  Decreased  lumbar lordosis  LUMBARAROM/PROM: Not tested due to compression fractures   LE MMT:  MMT Right 05/13/2021 Left 05/13/2021  Hip flexion 3/5; pain with minimal resistance <3/5 pain worse on L  Hip extension    Hip abduction Weakness with testing in sitting Weakness with testing in sitting  Hip adduction    Hip internal rotation    Hip external rotation    Knee flexion 5/5 tested in sitting 4+/5 tested in sitting  Knee extension 5/5 4+5  Ankle dorsiflexion 5/5 5/5  Ankle plantarflexion WFL tested in sitting WFL tested in sitting  Ankle inversion    Ankle eversion     (Blank rows = not tested)  LE ROM:  ROM Right 05/13/2021 Left 05/13/2021  Hip flexion    Hip extension    Hip abduction    Hip adduction    Hip internal rotation    Hip external rotation 24 with minimal pain 19 with min to moderate pain  Knee flexion    Knee extension    Ankle dorsiflexion    Ankle plantarflexion    Ankle inversion    Ankle eversion     (Blank rows = not tested)    GAIT: Assistive device utilized: None Comments: little to no reciprocal arm swing, decreased pelvic rotation, decreased gait speed, stiff lumbar    TODAY'S TREATMENT  See education below   PATIENT EDUCATION:  Education details: Educated pt concerning anatomy and precautions/restrictions of compression fractures.  Educated pt in posture and POC.  Answered Pt's questions.  Educated pt in aquatic rehab process and benefits and purpose of aquatic therapy.  Person educated: Patient Education method: Explanation Education comprehension: verbalized understanding   HOME EXERCISE PROGRAM: None given  ASSESSMENT:  CLINICAL IMPRESSION: Patient is a 60 y.o. female with a dx of multiple myeloma presenting to the clinic with  lumbar pain, thoracic pain, muscle weakness and difficulty in walking.  Pt has osteoporosis from multiple myeloma, and has 10 compression fractures in thoracic and lumbar spine.  She reports she is in complete remission from multiple myeloma currently. Pt has significantly reduced tolerance to activity and has difficulty and pain with all ADLs/IADLs.  Pt has increased pain with leaning forward and also with straightening.  She has pain with sitting at work and performing car transfers.  Pt has weakness in bilat LE's with L > R.   Objective impairments include decreased endurance, decreased strength, difficulty in walking, pain, Abnormal gait and decreased activity tolerance. These impairments are limiting patient from cleaning, community activity, meal prep, occupation, laundry, shopping, and self care activities (dressing/bathing) and ambulation . Personal factors including 3+ comorbidities: Compression fractures at T3, T9-T12, and L1-L5.  Osteoporosis, and multiple myeloma  are also affecting patient's functional outcome. Patient will benefit from skilled PT to address above impairments and improve overall function.  REHAB POTENTIAL: Good  CLINICAL DECISION MAKING: Evolving/moderate complexity  EVALUATION COMPLEXITY: Moderate   GOALS:   SHORT TERM GOALS:  STG Name Target Date Goal status  1 Pt will tolerate the initiation of aquatic therapy without adverse effects for improved tolerance to daily activities and daily mobility.   Baseline:  05/26/2021 INITIAL  2 Pt's worst pain will be no > 8/10 for improved tolerance to daily mobility.  Baseline:  06/02/2021 INITIAL  3 Pt will report improved tolerance and reduced pain with self care activities including dressing and bathing. Baseline: 06/02/2021 INITIAL  4 Pt will demo improved quality of gait including increased reciprocal arm swing, pelvic rotation,  and gait speed.  Baseline: 06/02/2021 INITIAL   LONG TERM GOALS:   LTG Name Target Date  Goal status  1 Pt will be able to stand to blow dry her hair without significant pain or difficulty.  Baseline: 06/23/2021  INITIAL  2 Pt will be able to stand to cook and prepare food without significant difficulty or limitation Baseline: 06/23/2021 INITIAL  3 Pt will report at least a 60-70% improvement in tolerance with daily functional mobility.  Baseline: 06/23/2021 INITIAL  4 Pt will be able to ambulate community distance without significant pain.  Baseline: 06/23/2021 INITIAL  5 Pt will demo improved strength to 4/5 in hip flexion bilat, 5/5 L knee strength, and WFL hip abd tested in sitting for improved tolerance with functional mobility skills and standing activities.  Baseline: 12/2102022 INITIAL    PLAN: PT FREQUENCY:  1-2 times per week  PT DURATION: 6 weeks  PLANNED INTERVENTIONS: Therapeutic exercises, Therapeutic activity, Neuro Muscular re-education, Balance training, Gait training, Patient/Family education, Stair training, and Aquatic Therapy  PLAN FOR NEXT SESSION: Aquatic Therapy.  Pt has a 10# lifting restriction from MD.    Selinda Michaels III PT, DPT 05/13/21 9:53 PM

## 2021-05-13 ENCOUNTER — Encounter (HOSPITAL_BASED_OUTPATIENT_CLINIC_OR_DEPARTMENT_OTHER): Payer: Self-pay | Admitting: Physical Therapy

## 2021-05-14 DIAGNOSIS — Z0001 Encounter for general adult medical examination with abnormal findings: Secondary | ICD-10-CM | POA: Insufficient documentation

## 2021-05-14 NOTE — Assessment & Plan Note (Signed)
She is struggling with this due to the multiple myeloma. She has had numerous compression fractures in her spine. She is seeing spine specialist and pain management is okay at this time not ideal.

## 2021-05-14 NOTE — Assessment & Plan Note (Signed)
Monitoring yearly with HgA1c.

## 2021-05-14 NOTE — Assessment & Plan Note (Signed)
Flu shot getting this week. Covid-19 booster getting this week. Pneumonia advised prevnar 20. Shingrix advised to get. Tetanus up to date. Colonoscopy up to date. Mammogram up to date, pap smear up to date and dexa up to date. Counseled about sun safety and mole surveillance. Counseled about the dangers of distracted driving. Given 10 year screening recommendations.

## 2021-05-14 NOTE — Assessment & Plan Note (Signed)
Needs yearly monitoring. Not on medication currently. LDL goal <130.

## 2021-05-14 NOTE — Assessment & Plan Note (Signed)
Seeing oncology and on maintenance therapy currently with remission.

## 2021-05-16 ENCOUNTER — Other Ambulatory Visit: Payer: Self-pay | Admitting: Oncology

## 2021-05-17 ENCOUNTER — Other Ambulatory Visit: Payer: Self-pay | Admitting: Oncology

## 2021-05-17 ENCOUNTER — Other Ambulatory Visit: Payer: Self-pay | Admitting: Nurse Practitioner

## 2021-05-17 DIAGNOSIS — C9000 Multiple myeloma not having achieved remission: Secondary | ICD-10-CM

## 2021-05-18 ENCOUNTER — Other Ambulatory Visit: Payer: Self-pay | Admitting: Nurse Practitioner

## 2021-05-18 ENCOUNTER — Encounter: Payer: Self-pay | Admitting: Oncology

## 2021-05-18 ENCOUNTER — Encounter: Payer: Self-pay | Admitting: *Deleted

## 2021-05-18 DIAGNOSIS — C9000 Multiple myeloma not having achieved remission: Secondary | ICD-10-CM

## 2021-05-18 MED ORDER — OXYCODONE HCL 5 MG PO TABS: 5.0000 mg | ORAL_TABLET | ORAL | 0 refills | Status: DC | PRN

## 2021-05-18 NOTE — Progress Notes (Signed)
Patient called requesting a refill for Oxycodone. Refill request given to Ned Card NP.

## 2021-05-19 ENCOUNTER — Encounter: Payer: Self-pay | Admitting: Oncology

## 2021-05-24 ENCOUNTER — Inpatient Hospital Stay: Payer: 59

## 2021-05-24 ENCOUNTER — Ambulatory Visit: Payer: 59 | Attending: Internal Medicine

## 2021-05-24 ENCOUNTER — Other Ambulatory Visit: Payer: Self-pay

## 2021-05-24 ENCOUNTER — Other Ambulatory Visit (HOSPITAL_BASED_OUTPATIENT_CLINIC_OR_DEPARTMENT_OTHER): Payer: Self-pay

## 2021-05-24 ENCOUNTER — Inpatient Hospital Stay: Payer: 59 | Attending: Oncology | Admitting: Oncology

## 2021-05-24 VITALS — BP 123/74 | HR 71 | Temp 97.8°F | Resp 18 | Ht 63.0 in | Wt 170.4 lb

## 2021-05-24 DIAGNOSIS — C9000 Multiple myeloma not having achieved remission: Secondary | ICD-10-CM

## 2021-05-24 DIAGNOSIS — Z23 Encounter for immunization: Secondary | ICD-10-CM

## 2021-05-24 LAB — CMP (CANCER CENTER ONLY)
ALT: 16 U/L (ref 0–44)
AST: 17 U/L (ref 15–41)
Albumin: 4.4 g/dL (ref 3.5–5.0)
Alkaline Phosphatase: 36 U/L — ABNORMAL LOW (ref 38–126)
Anion gap: 10 (ref 5–15)
BUN: 18 mg/dL (ref 6–20)
CO2: 25 mmol/L (ref 22–32)
Calcium: 9.6 mg/dL (ref 8.9–10.3)
Chloride: 105 mmol/L (ref 98–111)
Creatinine: 0.84 mg/dL (ref 0.44–1.00)
GFR, Estimated: >60 mL/min (ref >60)
Glucose, Bld: 137 mg/dL — ABNORMAL HIGH (ref 70–99)
Potassium: 3.9 mmol/L (ref 3.5–5.1)
Sodium: 140 mmol/L (ref 135–145)
Total Bilirubin: 0.5 mg/dL (ref 0.3–1.2)
Total Protein: 6.3 g/dL — ABNORMAL LOW (ref 6.5–8.1)

## 2021-05-24 LAB — CBC WITH DIFFERENTIAL (CANCER CENTER ONLY)
Abs Immature Granulocytes: 0.01 10*3/uL (ref 0.00–0.07)
Basophils Absolute: 0.1 10*3/uL (ref 0.0–0.1)
Basophils Relative: 2 %
Eosinophils Absolute: 0.1 10*3/uL (ref 0.0–0.5)
Eosinophils Relative: 3 %
HCT: 45.1 % (ref 36.0–46.0)
Hemoglobin: 15.3 g/dL — ABNORMAL HIGH (ref 12.0–15.0)
Immature Granulocytes: 0 %
Lymphocytes Relative: 18 %
Lymphs Abs: 0.7 10*3/uL (ref 0.7–4.0)
MCH: 30.8 pg (ref 26.0–34.0)
MCHC: 33.9 g/dL (ref 30.0–36.0)
MCV: 90.7 fL (ref 80.0–100.0)
Monocytes Absolute: 0.3 10*3/uL (ref 0.1–1.0)
Monocytes Relative: 8 %
Neutro Abs: 2.5 10*3/uL (ref 1.7–7.7)
Neutrophils Relative %: 69 %
Platelet Count: 226 10*3/uL (ref 150–400)
RBC: 4.97 MIL/uL (ref 3.87–5.11)
RDW: 12.8 % (ref 11.5–15.5)
WBC Count: 3.6 10*3/uL — ABNORMAL LOW (ref 4.0–10.5)
nRBC: 0 % (ref 0.0–0.2)

## 2021-05-24 MED ORDER — INFLUENZA VAC SPLIT QUAD 0.5 ML IM SUSY
PREFILLED_SYRINGE | INTRAMUSCULAR | 0 refills | Status: DC
  Filled 2021-05-24: qty 0.5, 1d supply, fill #0

## 2021-05-24 MED ORDER — PFIZER COVID-19 VAC BIVALENT 30 MCG/0.3ML IM SUSP
INTRAMUSCULAR | 0 refills | Status: DC
  Filled 2021-05-24: qty 0.3, 1d supply, fill #0

## 2021-05-24 MED ORDER — SODIUM CHLORIDE 0.9 % IV SOLN
Freq: Once | INTRAVENOUS | Status: AC
Start: 2021-05-24 — End: 2021-05-24

## 2021-05-24 MED ORDER — ZOLEDRONIC ACID 4 MG/100ML IV SOLN
4.0000 mg | Freq: Once | INTRAVENOUS | Status: AC
  Administered 2021-05-24: 4 mg via INTRAVENOUS
  Filled 2021-05-24: qty 100

## 2021-05-24 NOTE — Progress Notes (Signed)
   Covid-19 Vaccination Clinic  Name:  ARLET MARTER    MRN: [id number] DOB: [birth year]  05/24/2021  Ms. Polio was observed post Covid-19 immunization for 15 minutes without incident. She was provided with Vaccine Information Sheet and instruction to access the V-Safe system.   Ms. Kothari was instructed to call 911 with any severe reactions post vaccine: Difficulty breathing  Swelling of face and throat  A fast heartbeat  A bad rash all over body  Dizziness and weakness   Immunizations Administered     Name Date Dose VIS Date Route   Pfizer Covid-19 Vaccine Bivalent Booster 05/24/2021  9:24 AM 0.3 mL 03/03/2021 Intramuscular   Manufacturer: [place]   Lot: [id number]   [place]: [phone]

## 2021-05-24 NOTE — Progress Notes (Signed)
[place] OFFICE PROGRESS NOTE   Diagnosis: Multiple myeloma  INTERVAL HISTORY:   Katherine Beard returns as scheduled.  She continues maintenance Revlimid.  No neuropathy symptoms.  No tooth or jaw pain.  She has discomfort at the left hip with certain movements.  She continues to have back pain.  She takes oxycodone twice daily.  She has beginning a water physical therapy program today.  Objective:  Vital signs in last 24 hours:  Blood pressure 123/74, pulse 71, temperature 97.8 F (36.6 C), temperature source Oral, resp. rate 18, height 5' 3"  (1.6 m), weight 170 lb 6.4 oz (77.3 kg), last menstrual period 03/06/2011, SpO2 100 %.    HEENT: No thrush or ulcers Resp: Lungs clear bilaterally Cardio: Regular rate and rhythm GI: No hepatosplenomegaly Vascular: No leg edema   Lab Results:  Lab Results  Component Value Date   WBC 3.6 (L) 05/24/2021   HGB 15.3 (H) 05/24/2021   HCT 45.1 05/24/2021   MCV 90.7 05/24/2021   PLT 226 05/24/2021   NEUTROABS 2.5 05/24/2021    CMP  Lab Results  Component Value Date   NA 136 04/26/2021   K 4.3 04/26/2021   CL 101 04/26/2021   CO2 29 04/26/2021   GLUCOSE 91 04/26/2021   BUN 21 (H) 04/26/2021   CREATININE 0.81 04/26/2021   CALCIUM 9.2 04/26/2021   PROT 6.0 (L) 04/26/2021   ALBUMIN 4.2 04/26/2021   AST 13 (L) 04/26/2021   ALT 12 04/26/2021   ALKPHOS 30 (L) 04/26/2021   BILITOT 0.5 04/26/2021   GFRNONAA >60 04/26/2021   GFRAA 78 12/11/2006    No results found for: CEA1, CEA, CAN199, CA125  Lab Results  Component Value Date   INR 1.0 09/04/2020   LABPROT 13.1 09/04/2020    Imaging:  No results found.  Medications: I have reviewed the patient's current medications.   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 Increase serum free  lambda light chains Serum M spike MRI pelvis 09/01/2020-innumerable small foci of signal abnormality concerning for multiple myeloma involvement Metastatic bone survey 09/03/2020-no discrete lytic bone lesions, compression fractures at T12, L1, L2, and L3, osteopenia Cycle 1 RVD 09/14/2020 Daratumumab beginning 09/28/2020 Cycle 2 RVD 10/05/2020 plus daratumumab Week 3 daratumumab 10/12/2020 Normal lambda light chains 10/26/2020 Cycle 3 RVD plus daratumumab 10/26/2020 Cycle 4 RVD plus daratumumab 11/16/2020, daratumumab changed to an every 2-week schedule beginning 11/23/2020 Cycle 5 RVD plus daratumumab 12/07/2020 Cycle 6 RVD plus daratumumab 12/28/2020 Normal lambda light chains 01/05/2021 Revlimid maintenance 21/28 days beginning 01/18/2021 Stem cell harvest at [facility] 03/29/2021 Revlimid resumed 03/30/2021   2. Severe back pain MRI lumbar spine July 12, 2020-subacute compression fractures at T12 and L1 MRI lumbar spine July 30, 2020-subacute T12 and L1 compression fractures, new subacute superior L2 endplate deformity MRI lumbar spine September 01, 2020 new/progressive compression fractures at L2 and L3, acute compression fracture at L4, evolution of T12 and L1 compression fractures MRI pelvis September 01, 2020-innumerable small foci of signal abnormality in the pelvis and femurs, no fracture identified, concerning for multiple myeloma versus metastases Zometa beginning 09/21/2020-plan monthly x3 then every 3 months, next due August 2022 MRI thoracic spine July 2022-new T3 compression fracture with multiple additional thoracic compression fractures 3. Melanoma left upper back 2014 4. History of diverticulitis 5. Mild hypercalcemia at presentation-resolved 6. Osteoporosis 7. [place] 8.  Depression-worsened depression symptoms 01/11/2021, referred to psychology   Disposition: Katherine Beard appears stable.  She continues Revlimid maintenance on a 21/28-day schedule.  She will begin another cycle of Revlimid today.   The lambda light chains were normal on 04/26/2021.  She will receive Zometa today.  Katherine Beard will return for an office and lab visit in 1 month.  [name], MD  05/24/2021  8:30 AM

## 2021-05-24 NOTE — Patient Instructions (Signed)

## 2021-05-24 NOTE — Progress Notes (Signed)
Patient seen by Dr. Sherrill today ? ?Vitals are within treatment parameters. ? ?Labs reviewed by Dr. Sherrill and are within treatment parameters. ? ?Per physician team, patient is ready for treatment and there are NO modifications to the treatment plan.  ?

## 2021-05-25 ENCOUNTER — Encounter (HOSPITAL_BASED_OUTPATIENT_CLINIC_OR_DEPARTMENT_OTHER): Payer: Self-pay | Admitting: Physical Therapy

## 2021-05-25 ENCOUNTER — Ambulatory Visit (HOSPITAL_BASED_OUTPATIENT_CLINIC_OR_DEPARTMENT_OTHER): Payer: 59 | Admitting: Physical Therapy

## 2021-05-25 DIAGNOSIS — M545 Low back pain, unspecified: Secondary | ICD-10-CM | POA: Diagnosis not present

## 2021-05-25 DIAGNOSIS — R2681 Unsteadiness on feet: Secondary | ICD-10-CM

## 2021-05-25 DIAGNOSIS — M546 Pain in thoracic spine: Secondary | ICD-10-CM

## 2021-05-25 DIAGNOSIS — R262 Difficulty in walking, not elsewhere classified: Secondary | ICD-10-CM

## 2021-05-25 DIAGNOSIS — R2689 Other abnormalities of gait and mobility: Secondary | ICD-10-CM

## 2021-05-25 DIAGNOSIS — G8929 Other chronic pain: Secondary | ICD-10-CM

## 2021-05-25 DIAGNOSIS — R293 Abnormal posture: Secondary | ICD-10-CM

## 2021-05-25 DIAGNOSIS — M6281 Muscle weakness (generalized): Secondary | ICD-10-CM

## 2021-05-25 LAB — KAPPA/LAMBDA LIGHT CHAINS
Kappa free light chain: 9.2 mg/L (ref 3.3–19.4)
Kappa, lambda light chain ratio: 1.3 (ref 0.26–1.65)
Lambda free light chains: 7.1 mg/L (ref 5.7–26.3)

## 2021-05-25 NOTE — Therapy (Signed)
Pain 4/10  OUTPATIENT PHYSICAL THERAPY TREATMENT NOTE   Patient Name: Katherine Beard MRN: [id number] DOB:March 13, 1961, 60 y.o., female Today's Date: 06/01/2021  PCP: Hoyt Koch, MD REFERRING PROVIDER: Hoyt Koch, *     Past Medical History:  Diagnosis Date   Acne    DIVERTICULITIS, HX OF    Epicondylitis syndrome of elbow    RIGHT    GERD (gastroesophageal reflux disease)    Hepatic cyst 2015   per MRI   HYPERGLYCEMIA    HYPERLIPIDEMIA    Hyperplastic colon polyp    Iliotibial band syndrome of right side    Liver cyst 2014   Melanoma ([place]) 2014   Excised -clear margins   OBESITY    Palpitations    Vitamin D deficiency    Past Surgical History:  Procedure Laterality Date   COLECTOMY  09-16-03   sigmoid   COLONOSCOPY  2014   COLONOSCOPY  32/06/2481   UMBILICAL LAPRPOSCOPY     AGE 18   Patient Active Problem List   Diagnosis Date Noted   Encounter for general adult medical examination with abnormal findings 05/14/2021   Multiple myeloma ([place]) 09/08/2020   Goals of care, counseling/discussion 09/08/2020   Lumbar radiculopathy 07/08/2020   Encounter for counseling 02/04/2020   Paresthesia 11/16/2010   HYPERGLYCEMIA 02/26/2007   Hyperlipidemia 02/20/2007   DIVERTICULITIS, HX OF 12/20/2006    REFERRING DIAG: : C90.00 (ICD-10-CM) - Multiple myeloma, remission status unspecified   THERAPY DIAG:  Low back pain, unspecified back pain laterality, unspecified chronicity, unspecified whether sciatica present  Pain in thoracic spine  Muscle weakness (generalized)  Difficulty in walking, not elsewhere classified  PERTINENT HISTORY: Compression fractures at T3, T9-T12, and L1-L5.  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  MD instructed her to not perform resistance machines and she has a 10# lifting restriction  PRECAUTIONS: 10LB weight lifting limit  SUBJECTIVE: "I am comfortable in the water, back hurting some but feels better in water"  PAIN:   Are you having pain? Yes VAS scale: 4/10 Pain location: back Pain orientation: Bilateral  PAIN TYPE: aching Pain description: constant  Aggravating factors: Leaning forward, sitting, standing, extending from a flexed position Relieving factors: lying down with pillow propped under legs and arms, heat, ice, medications, CBD roll-on    OBJECTIVE:     HOME EXERCISE PROGRAM: None given: Will add when appropriate   ASSESSMENT:   CLINICAL IMPRESSION: Introduced pt to setting.  She demonstrates confidence, comfortable in water. Instructed pt given her history we would begin slow and encouraged her to let me know of any increased pain or any concerns. Begun with core engagement water walking and progressed to gentle core strengthening. Pt reports decreased discomfort while submerged. Worked anterior chain then posterior. She tolerated well entire session with 2-3 recovery periods, was tired upon completion stating she was "feeling it" some.   Objective impairments include decreased endurance, decreased strength, difficulty in walking, pain, Abnormal gait and decreased activity tolerance. These impairments are limiting patient from cleaning, community activity, meal prep, occupation, laundry, shopping, and self care activities (dressing/bathing) and ambulation . Personal factors including 3+ comorbidities: Compression fractures at T3, T9-T12, and L1-L5.  Osteoporosis, and multiple myeloma  are also affecting patient's functional outcome. Patient will benefit from skilled PT to address above impairments and improve overall function.   REHAB POTENTIAL: Good   CLINICAL DECISION MAKING: Evolving/moderate complexity   EVALUATION COMPLEXITY: Moderate     GOALS:     SHORT TERM GOALS:  STG Name Target Date Goal status  1 Pt will tolerate the initiation of aquatic therapy without adverse effects for improved tolerance to daily activities and daily mobility.   Baseline:  05/26/2021 INITIAL  2  Pt's worst pain will be no > 8/10 for improved tolerance to daily mobility.  Baseline:  06/02/2021 INITIAL  3 Pt will report improved tolerance and reduced pain with self care activities including dressing and bathing. Baseline: 06/02/2021 INITIAL  4 Pt will demo improved quality of gait including increased reciprocal arm swing, pelvic rotation, and gait speed.  Baseline: 06/02/2021 INITIAL    LONG TERM GOALS:    LTG Name Target Date Goal status  1 Pt will be able to stand to blow dry her hair without significant pain or difficulty.  Baseline: 06/23/2021   INITIAL  2 Pt will be able to stand to cook and prepare food without significant difficulty or limitation Baseline: 06/23/2021 INITIAL  3 Pt will report at least a 60-70% improvement in tolerance with daily functional mobility.  Baseline: 06/23/2021 INITIAL  4 Pt will be able to ambulate community distance without significant pain.  Baseline: 06/23/2021 INITIAL  5 Pt will demo improved strength to 4/5 in hip flexion bilat, 5/5 L knee strength, and WFL hip abd tested in sitting for improved tolerance with functional mobility skills and standing activities.  Baseline: 12/2102022 INITIAL      PLAN: PT FREQUENCY:  1-2 times per week   PT DURATION: 6 weeks  PLANNED INTERVENTIONS: Therapeutic exercises, Therapeutic activity, Neuro Muscular re-education, Balance training, Gait training, Patient/Family education, Stair training, and Aquatic Therapy    TODAY'S TREATMENT: 11/22  Pt seen for aquatic therapy today.  Treatment took place in water 3.25-4.8 ft in depth at the [facility] pool. Temp of water was 94.  Pt entered/exited the pool via stairs (step through pattern) independently with bilat rail.  Pt entered water for aquatic therapy for first time and was introduced to principles and therapeutic effects of water as she ambulated and acclimated to pool  Warm up: walking forward, back and side stepping multiple widths and  lengths of pool.  Seated stretching: assisted hamstring, adductors and gastroc Standing stretch lateral core right and left modified yoga position gentle x2 ea side holding x 15 seconds  Exercises Using blue noodle: modified plank holding position x 3 for 30 seconds.                                Lat pull down 2x5 in modified plank position  Core strengthening and balance: sitting on noodle gaining position without "swimmy arms". Progressing to pushing noodle down and balancing on noodle with extended arms. Step ups on water step in 4 ft forward and back x 10. Cues for tightened core.  Pt requires buoyancy for support and to offload joints with strengthening exercises. Viscosity of the water is needed for resistance of strengthening; water current perturbations provides challenge to standing balance unsupported, requiring increased core activation.     PLAN FOR NEXT SESSION: Aquatic Therapy.  Pt has a 10# lifting restriction from MD.  PATIENT EDUCATION:  Properties of water; benefits and advantages of aquatic therapy    Stanton Kidney Tharon Aquas) Tiasha Helvie MPT 06/01/2021, 1:31 PM

## 2021-06-01 ENCOUNTER — Other Ambulatory Visit: Payer: Self-pay

## 2021-06-01 ENCOUNTER — Ambulatory Visit (HOSPITAL_BASED_OUTPATIENT_CLINIC_OR_DEPARTMENT_OTHER): Payer: 59 | Admitting: Physical Therapy

## 2021-06-01 ENCOUNTER — Encounter (HOSPITAL_BASED_OUTPATIENT_CLINIC_OR_DEPARTMENT_OTHER): Payer: Self-pay | Admitting: Physical Therapy

## 2021-06-01 DIAGNOSIS — M545 Low back pain, unspecified: Secondary | ICD-10-CM

## 2021-06-01 DIAGNOSIS — R262 Difficulty in walking, not elsewhere classified: Secondary | ICD-10-CM

## 2021-06-01 DIAGNOSIS — M6281 Muscle weakness (generalized): Secondary | ICD-10-CM

## 2021-06-01 DIAGNOSIS — M546 Pain in thoracic spine: Secondary | ICD-10-CM

## 2021-06-01 NOTE — Therapy (Signed)
Pain 4/10  OUTPATIENT PHYSICAL THERAPY TREATMENT NOTE   Patient Name: Katherine Beard MRN: [id number] DOB:May 03, 1961, 60 y.o., female Today's Date: 06/01/2021  PCP: Hoyt Koch, MD REFERRING PROVIDER: Hoyt Koch, *   PT End of Session - 06/01/21 1705     Visit Number 3    Number of Visits 12    Date for PT Re-Evaluation 06/23/21    PT Start Time 1701    PT Stop Time 9935    PT Time Calculation (min) 44 min    Equipment Utilized During Treatment Other (comment)   Ankle cuff and waist buoys, kick board, noodle/sqoodle, nekdoodle, weights and barbells, water walker   Activity Tolerance Patient tolerated treatment well    Behavior During Therapy WFL for tasks assessed/performed              Past Medical History:  Diagnosis Date   Acne    DIVERTICULITIS, HX OF    Epicondylitis syndrome of elbow    RIGHT    GERD (gastroesophageal reflux disease)    Hepatic cyst 2015   per MRI   HYPERGLYCEMIA    HYPERLIPIDEMIA    Hyperplastic colon polyp    Iliotibial band syndrome of right side    Liver cyst 2014   Melanoma ([place]) 2014   Excised -clear margins   OBESITY    Palpitations    Vitamin D deficiency    Past Surgical History:  Procedure Laterality Date   COLECTOMY  09-16-03   sigmoid   COLONOSCOPY  2014   COLONOSCOPY  70/17/7939   UMBILICAL LAPRPOSCOPY     AGE 71   Patient Active Problem List   Diagnosis Date Noted   Encounter for general adult medical examination with abnormal findings 05/14/2021   Multiple myeloma ([place]) 09/08/2020   Goals of care, counseling/discussion 09/08/2020   Lumbar radiculopathy 07/08/2020   Encounter for counseling 02/04/2020   Paresthesia 11/16/2010   HYPERGLYCEMIA 02/26/2007   Hyperlipidemia 02/20/2007   DIVERTICULITIS, HX OF 12/20/2006    REFERRING DIAG: : C90.00 (ICD-10-CM) - Multiple myeloma, remission status unspecified   THERAPY DIAG:  Low back pain, unspecified back pain laterality, unspecified  chronicity, unspecified whether sciatica present  Pain in thoracic spine  Muscle weakness (generalized)  Difficulty in walking, not elsewhere classified  PERTINENT HISTORY: Compression fractures at T3, T9-T12, and L1-L5.  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  MD instructed her to not perform resistance machines and she has a 10# lifting restriction  PRECAUTIONS: 10LB weight lifting limit  SUBJECTIVE: "All of my muscles hurt the day after I saw you, back hurt more than ever combined with all the activity from holiday."  PAIN:  Are you having pain? Yes VAS scale: 5/10  11/29 Pain location: back Pain orientation: Bilateral  PAIN TYPE: aching Pain description: constant  Aggravating factors: Leaning forward, sitting, standing, extending from a flexed position Relieving factors: lying down with pillow propped under legs and arms, heat, ice, medications, CBD roll-on    OBJECTIVE:     HOME EXERCISE PROGRAM: None given: Will add when appropriate   ASSESSMENT:   CLINICAL IMPRESSION: Pt attributing some of her increased pain after last session to having an infusion that day along with getting 2 vaccines followed by the holiday.  States a lot was muscle pain and thought it was a "good"pain. Pt directed through exercises as last visit adding only aerobic capacity challenge so to be able to assess pt post therapy discomfort and determine progression of next session/pt tolerance.  She demonstrated improvement in execution of pilates exercises. She uses [place] after session (unbilled) for gentle LB massage.  Pt reports no pain while in jacuzzi.     Objective impairments include decreased endurance, decreased strength, difficulty in walking, pain, Abnormal gait and decreased activity tolerance. These impairments are limiting patient from cleaning, community activity, meal prep, occupation, laundry, shopping, and self care activities (dressing/bathing) and ambulation . Personal factors  including 3+ comorbidities: Compression fractures at T3, T9-T12, and L1-L5.  Osteoporosis, and multiple myeloma  are also affecting patient's functional outcome. Patient will benefit from skilled PT to address above impairments and improve overall function.   REHAB POTENTIAL: Good   CLINICAL DECISION MAKING: Evolving/moderate complexity   EVALUATION COMPLEXITY: Moderate     GOALS:     SHORT TERM GOALS:   STG Name Target Date Goal status  1 Pt will tolerate the initiation of aquatic therapy without adverse effects for improved tolerance to daily activities and daily mobility.   Baseline:  05/26/2021 INITIAL  2 Pt's worst pain will be no > 8/10 for improved tolerance to daily mobility.  Baseline:  06/02/2021 INITIAL  3 Pt will report improved tolerance and reduced pain with self care activities including dressing and bathing. Baseline: 06/02/2021 INITIAL  4 Pt will demo improved quality of gait including increased reciprocal arm swing, pelvic rotation, and gait speed.  Baseline: 06/02/2021 INITIAL    LONG TERM GOALS:    LTG Name Target Date Goal status  1 Pt will be able to stand to blow dry her hair without significant pain or difficulty.  Baseline: 06/23/2021   INITIAL  2 Pt will be able to stand to cook and prepare food without significant difficulty or limitation Baseline: 06/23/2021 INITIAL  3 Pt will report at least a 60-70% improvement in tolerance with daily functional mobility.  Baseline: 06/23/2021 INITIAL  4 Pt will be able to ambulate community distance without significant pain.  Baseline: 06/23/2021 INITIAL  5 Pt will demo improved strength to 4/5 in hip flexion bilat, 5/5 L knee strength, and WFL hip abd tested in sitting for improved tolerance with functional mobility skills and standing activities.  Baseline: 12/2102022 INITIAL      PLAN: PT FREQUENCY:  1-2 times per week   PT DURATION: 6 weeks  PLANNED INTERVENTIONS: Therapeutic exercises, Therapeutic  activity, Neuro Muscular re-education, Balance training, Gait training, Patient/Family education, Stair training, and Aquatic Therapy    TODAY'S TREATMENT: 11/29 Pt seen for aquatic therapy today.  Treatment took place in water 3.25-4.8 ft in depth at the [facility] pool. Temp of water was 94.  Pt entered/exited the pool via stairs (step through pattern) independently with bilat rail      Warm up: walking forward, back and side stepping multiple widths and lengths of pool.      Seated stretching: assisted hamstring, adductors and gastroc  Exercises Using blue noodle:Pilates: modified plank holding position x 3 for 30 seconds.                                Lat pull down 2x5 in modified plank position        Core strengthening and balance: sitting on noodle gaining position with hands out of water. Progressing to pushing   noodle down and balancing on noodle with extended arms. Pt holds both positions x 30 sec Step ups on water step in 4 ft forward, side stepping and back x  10. Cues for tightened core. UE: horizontal abd/add x 10 for stretching. Shoulder flex then abd/add x 10 using 1 foam hand buoy   Aerobic capacity: cylciing on yellow noodle: x 10 mins 10-20 sec quick revolutions followed by 20-25 second recovery.  Pt requires buoyancy for support and to offload joints with strengthening exercises. Viscosity of the water is needed for resistance of strengthening; water current perturbations provides challenge to standing balance unsupported, requiring increased core activation.    11/22  Pt seen for aquatic therapy today.  Treatment took place in water 3.25-4.8 ft in depth at the [facility] pool. Temp of water was 94.  Pt entered/exited the pool via stairs (step through pattern) independently with bilat rail.  Pt entered water for aquatic therapy for first time and was introduced to principles and therapeutic effects of water as she ambulated and acclimated to  pool  Warm up: walking forward, back and side stepping multiple widths and lengths of pool.  Seated stretching: assisted hamstring, adductors and gastroc Standing stretch lateral core right and left modified yoga position gentle x2 ea side holding x 15 seconds  Exercises Using blue noodle: modified plank holding position x 3 for 30 seconds.                                Lat pull down 2x5 in modified plank position  Core strengthening and balance: sitting on noodle gaining position without "swimmy arms". Progressing to pushing noodle down and balancing on noodle with extended arms. Pt able to gain and hold both positions after several tries x 30 sec Step ups on water step in 4 ft forward and back x 10. Cues for tightened core.  Pt requires buoyancy for support and to offload joints with strengthening exercises. Viscosity of the water is needed for resistance of strengthening; water current perturbations provides challenge to standing balance unsupported, requiring increased core activation.     PLAN FOR NEXT SESSION: Toleration of last treatment. Advance program as tolerated and indicated.  PATIENT EDUCATION:  Properties of water; benefits and advantages of aquatic therapy    Katherine Beard (Frankie) Jentry Warnell MPT 06/01/2021, 6:07 PM

## 2021-06-04 ENCOUNTER — Encounter: Payer: Self-pay | Admitting: Oncology

## 2021-06-08 ENCOUNTER — Encounter (HOSPITAL_BASED_OUTPATIENT_CLINIC_OR_DEPARTMENT_OTHER): Payer: Self-pay | Admitting: Physical Therapy

## 2021-06-08 ENCOUNTER — Other Ambulatory Visit: Payer: Self-pay

## 2021-06-08 ENCOUNTER — Ambulatory Visit (HOSPITAL_BASED_OUTPATIENT_CLINIC_OR_DEPARTMENT_OTHER): Payer: 59 | Attending: Nurse Practitioner | Admitting: Physical Therapy

## 2021-06-08 DIAGNOSIS — M546 Pain in thoracic spine: Secondary | ICD-10-CM | POA: Diagnosis present

## 2021-06-08 DIAGNOSIS — M6281 Muscle weakness (generalized): Secondary | ICD-10-CM | POA: Insufficient documentation

## 2021-06-08 DIAGNOSIS — R262 Difficulty in walking, not elsewhere classified: Secondary | ICD-10-CM | POA: Insufficient documentation

## 2021-06-08 DIAGNOSIS — M545 Low back pain, unspecified: Secondary | ICD-10-CM | POA: Insufficient documentation

## 2021-06-08 NOTE — Therapy (Signed)
Pain 4/10  OUTPATIENT PHYSICAL THERAPY TREATMENT NOTE   Patient Name: Katherine Beard MRN: [id number] DOB:July 15, 1960, 60 y.o., female Today's Date: 06/08/2021  PCP: Hoyt Koch, MD REFERRING PROVIDER: Hoyt Koch, *   PT End of Session - 06/08/21 1711     Visit Number 4    Number of Visits 12    Date for PT Re-Evaluation 06/23/21    PT Start Time 0998    PT Stop Time 3382    PT Time Calculation (min) 40 min    Equipment Utilized During Treatment Other (comment)    Activity Tolerance Patient tolerated treatment well    Behavior During Therapy WFL for tasks assessed/performed              Past Medical History:  Diagnosis Date   Acne    DIVERTICULITIS, HX OF    Epicondylitis syndrome of elbow    RIGHT    GERD (gastroesophageal reflux disease)    Hepatic cyst 2015   per MRI   HYPERGLYCEMIA    HYPERLIPIDEMIA    Hyperplastic colon polyp    Iliotibial band syndrome of right side    Liver cyst 2014   Melanoma ([place]) 2014   Excised -clear margins   OBESITY    Palpitations    Vitamin D deficiency    Past Surgical History:  Procedure Laterality Date   COLECTOMY  09-16-03   sigmoid   COLONOSCOPY  2014   COLONOSCOPY  50/53/9767   UMBILICAL LAPRPOSCOPY     AGE 73   Patient Active Problem List   Diagnosis Date Noted   Encounter for general adult medical examination with abnormal findings 05/14/2021   Multiple myeloma ([place]) 09/08/2020   Goals of care, counseling/discussion 09/08/2020   Lumbar radiculopathy 07/08/2020   Encounter for counseling 02/04/2020   Paresthesia 11/16/2010   HYPERGLYCEMIA 02/26/2007   Hyperlipidemia 02/20/2007   DIVERTICULITIS, HX OF 12/20/2006    REFERRING DIAG: : C90.00 (ICD-10-CM) - Multiple myeloma, remission status unspecified   THERAPY DIAG:  Low back pain, unspecified back pain laterality, unspecified chronicity, unspecified whether sciatica present  Pain in thoracic spine  Muscle weakness  (generalized)  PERTINENT HISTORY: Compression fractures at T3, T9-T12, and L1-L5.  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  MD instructed her to not perform resistance machines and she has a 10# lifting restriction  PRECAUTIONS: 10LB weight lifting limit  SUBJECTIVE: "Just muscle soarness after last session.  Was able [place] drive [place] the highway for [place] first time on Sunday."  PAIN:  Are you having pain? Yes VAS scale: 3-4/10  11/29 Pain location: back Pain orientation: Bilateral  PAIN TYPE: aching Pain description: constant  Aggravating factors: Leaning forward, sitting, standing, extending from a flexed position Relieving factors: lying down with pillow propped under legs and arms, heat, ice, medications, CBD roll-on    OBJECTIVE:     HOME EXERCISE PROGRAM: None given: Will add when appropriate   ASSESSMENT:   CLINICAL IMPRESSION: Pt reports good toleration from last session.  Just some muscle soarness but no increase in back pain. Slight decrease in pain reported today. Added QL strengthening and more aggressive stretching reminding pt to keep all movements slow and calculated, be mindful to avoid injury or increased pain.  She did report aggravated left scapular/thoracic area muscle tightness with exercise. Pt to be out of town for the next 2 weeks. She is encouraged to use pool as she has access to for stretching and strengthening      Objective  impairments include decreased endurance, decreased strength, difficulty in walking, pain, Abnormal gait and decreased activity tolerance. These impairments are limiting patient from cleaning, community activity, meal prep, occupation, laundry, shopping, and self care activities (dressing/bathing) and ambulation . Personal factors including 3+ comorbidities: Compression fractures at T3, T9-T12, and L1-L5.  Osteoporosis, and multiple myeloma  are also affecting patient's functional outcome. Patient will benefit from skilled PT to address above  impairments and improve overall function.   REHAB POTENTIAL: Good   CLINICAL DECISION MAKING: Evolving/moderate complexity   EVALUATION COMPLEXITY: Moderate     GOALS:     SHORT TERM GOALS:   STG Name Target Date Goal status  1 Pt will tolerate the initiation of aquatic therapy without adverse effects for improved tolerance to daily activities and daily mobility.   Baseline:  05/26/2021 INITIAL  2 Pt's worst pain will be no > 8/10 for improved tolerance to daily mobility.  Baseline:  06/02/2021 INITIAL  3 Pt will report improved tolerance and reduced pain with self care activities including dressing and bathing. Baseline: 06/02/2021 INITIAL  4 Pt will demo improved quality of gait including increased reciprocal arm swing, pelvic rotation, and gait speed.  Baseline: 06/02/2021 INITIAL    LONG TERM GOALS:    LTG Name Target Date Goal status  1 Pt will be able to stand to blow dry her hair without significant pain or difficulty.  Baseline: 06/23/2021   INITIAL  2 Pt will be able to stand to cook and prepare food without significant difficulty or limitation Baseline: 06/23/2021 INITIAL  3 Pt will report at least a 60-70% improvement in tolerance with daily functional mobility.  Baseline: 06/23/2021 INITIAL  4 Pt will be able to ambulate community distance without significant pain.  Baseline: 06/23/2021 INITIAL  5 Pt will demo improved strength to 4/5 in hip flexion bilat, 5/5 L knee strength, and WFL hip abd tested in sitting for improved tolerance with functional mobility skills and standing activities.  Baseline: 12/2102022 INITIAL      PLAN: PT FREQUENCY:  1-2 times per week   PT DURATION: 6 weeks  PLANNED INTERVENTIONS: Therapeutic exercises, Therapeutic activity, Neuro Muscular re-education, Balance training, Gait training, Patient/Family education, Stair training, and Aquatic Therapy    TODAY'S TREATMENT: 12-6 11/22  Pt seen for aquatic therapy today.  Treatment  took place in water 3.25-4.8 ft in depth at the [facility] pool. Temp of water was 94.  Pt entered/exited the pool via stairs (step through pattern) independently with bilat rail.  Pt entered water for aquatic therapy for first time and was introduced to principles and therapeutic effects of water as she ambulated and acclimated to pool  Warm up: walking forward, back and side stepping multiple widths and lengths of pool.  Seated stretching: assisted hamstring, adductors and gastroc Standing stretch lateral core right and left modified yoga position gentle x2 ea side holding x 15 seconds  Exercises Core strengthening and balance Using blue noodle: modified plank x3 holding for 30 seconds  sitting on blue noodle balanced with ue out of water. Progressed to pushing noodle down and balancing on noodle with extended arms. 3 x 30 sec.  Progressed to yellow noodle  -Side to side pendulum for stretch x 3-4, then exercise x 5 R/L using hand buoys Forward to back pendulum Knees to chest x 10 -step ups on bottom step x 10 fwd and sidestepping  Aerobic capacity: kicking supported posteriorly by noodle 4 lengths of pool.   Pt requires buoyancy  for support and to offload joints with strengthening exercises. Viscosity of the water is needed for resistance of strengthening; water current perturbations provides challenge to standing balance unsupported, requiring increased core activation.      PLAN FOR NEXT SESSION: Progress core strengthening  PATIENT EDUCATION:  Properties of water; benefits and advantages of aquatic therapy    Stanton Kidney (Frankie) Gregg Winchell MPT 06/08/2021, 6:10 PM

## 2021-06-09 ENCOUNTER — Telehealth: Payer: Self-pay

## 2021-06-09 ENCOUNTER — Other Ambulatory Visit: Payer: Self-pay

## 2021-06-09 ENCOUNTER — Other Ambulatory Visit: Payer: Self-pay | Admitting: Nurse Practitioner

## 2021-06-09 DIAGNOSIS — C9000 Multiple myeloma not having achieved remission: Secondary | ICD-10-CM

## 2021-06-09 MED ORDER — OXYCODONE HCL 5 MG PO TABS: 5.0000 mg | ORAL_TABLET | ORAL | 0 refills | Status: DC | PRN

## 2021-06-09 MED ORDER — METHOCARBAMOL 500 MG PO TABS: ORAL_TABLET | ORAL | 1 refills | Status: DC

## 2021-06-09 MED ORDER — ESCITALOPRAM OXALATE 5 MG PO TABS: 5.0000 mg | ORAL_TABLET | Freq: Every day | ORAL | 3 refills | Status: DC

## 2021-06-09 NOTE — Telephone Encounter (Signed)
Pt sent my chart message requesting refill for oxycodone IR 5 mg request placed on providers desk.

## 2021-06-13 ENCOUNTER — Other Ambulatory Visit: Payer: Self-pay | Admitting: Oncology

## 2021-06-14 ENCOUNTER — Encounter: Payer: Self-pay | Admitting: Oncology

## 2021-06-14 ENCOUNTER — Ambulatory Visit (HOSPITAL_BASED_OUTPATIENT_CLINIC_OR_DEPARTMENT_OTHER): Payer: Self-pay | Admitting: Physical Therapy

## 2021-06-14 ENCOUNTER — Other Ambulatory Visit: Payer: Self-pay | Admitting: Oncology

## 2021-06-22 ENCOUNTER — Ambulatory Visit (HOSPITAL_BASED_OUTPATIENT_CLINIC_OR_DEPARTMENT_OTHER): Payer: 59 | Admitting: Physical Therapy

## 2021-06-22 ENCOUNTER — Inpatient Hospital Stay: Payer: 59 | Attending: Oncology | Admitting: Oncology

## 2021-06-22 ENCOUNTER — Other Ambulatory Visit: Payer: Self-pay

## 2021-06-22 ENCOUNTER — Encounter (HOSPITAL_BASED_OUTPATIENT_CLINIC_OR_DEPARTMENT_OTHER): Payer: Self-pay | Admitting: Physical Therapy

## 2021-06-22 ENCOUNTER — Inpatient Hospital Stay: Payer: 59

## 2021-06-22 VITALS — BP 144/81 | HR 76 | Temp 98.1°F | Resp 18 | Ht 63.0 in | Wt 172.4 lb

## 2021-06-22 DIAGNOSIS — Z85820 Personal history of malignant melanoma of skin: Secondary | ICD-10-CM | POA: Diagnosis not present

## 2021-06-22 DIAGNOSIS — F32A Depression, unspecified: Secondary | ICD-10-CM | POA: Diagnosis not present

## 2021-06-22 DIAGNOSIS — C9000 Multiple myeloma not having achieved remission: Secondary | ICD-10-CM

## 2021-06-22 DIAGNOSIS — M549 Dorsalgia, unspecified: Secondary | ICD-10-CM | POA: Insufficient documentation

## 2021-06-22 DIAGNOSIS — M545 Low back pain, unspecified: Secondary | ICD-10-CM

## 2021-06-22 DIAGNOSIS — M6281 Muscle weakness (generalized): Secondary | ICD-10-CM

## 2021-06-22 DIAGNOSIS — M546 Pain in thoracic spine: Secondary | ICD-10-CM

## 2021-06-22 DIAGNOSIS — R262 Difficulty in walking, not elsewhere classified: Secondary | ICD-10-CM

## 2021-06-22 LAB — CBC WITH DIFFERENTIAL (CANCER CENTER ONLY)
Abs Immature Granulocytes: 0.01 10*3/uL (ref 0.00–0.07)
Basophils Absolute: 0.1 10*3/uL (ref 0.0–0.1)
Basophils Relative: 2 %
Eosinophils Absolute: 0.2 10*3/uL (ref 0.0–0.5)
Eosinophils Relative: 4 %
HCT: 42.9 % (ref 36.0–46.0)
Hemoglobin: 14.4 g/dL (ref 12.0–15.0)
Immature Granulocytes: 0 %
Lymphocytes Relative: 24 %
Lymphs Abs: 0.9 10*3/uL (ref 0.7–4.0)
MCH: 30.8 pg (ref 26.0–34.0)
MCHC: 33.6 g/dL (ref 30.0–36.0)
MCV: 91.7 fL (ref 80.0–100.0)
Monocytes Absolute: 0.5 10*3/uL (ref 0.1–1.0)
Monocytes Relative: 14 %
Neutro Abs: 2.2 10*3/uL (ref 1.7–7.7)
Neutrophils Relative %: 56 %
Platelet Count: 265 10*3/uL (ref 150–400)
RBC: 4.68 MIL/uL (ref 3.87–5.11)
RDW: 13.3 % (ref 11.5–15.5)
WBC Count: 3.9 10*3/uL — ABNORMAL LOW (ref 4.0–10.5)
nRBC: 0 % (ref 0.0–0.2)

## 2021-06-22 LAB — CMP (CANCER CENTER ONLY)
ALT: 16 U/L (ref 0–44)
AST: 16 U/L (ref 15–41)
Albumin: 4.6 g/dL (ref 3.5–5.0)
Alkaline Phosphatase: 37 U/L — ABNORMAL LOW (ref 38–126)
Anion gap: 8 (ref 5–15)
BUN: 18 mg/dL (ref 6–20)
CO2: 29 mmol/L (ref 22–32)
Calcium: 9.5 mg/dL (ref 8.9–10.3)
Chloride: 102 mmol/L (ref 98–111)
Creatinine: 0.79 mg/dL (ref 0.44–1.00)
GFR, Estimated: >60 mL/min (ref >60)
Glucose, Bld: 145 mg/dL — ABNORMAL HIGH (ref 70–99)
Potassium: 3.6 mmol/L (ref 3.5–5.1)
Sodium: 139 mmol/L (ref 135–145)
Total Bilirubin: 0.4 mg/dL (ref 0.3–1.2)
Total Protein: 6.4 g/dL — ABNORMAL LOW (ref 6.5–8.1)

## 2021-06-22 NOTE — Therapy (Signed)
OUTPATIENT PHYSICAL THERAPY TREATMENT NOTE   Patient Name: Katherine Beard MRN: [id number] DOB:1960/09/02, 60 y.o., female Today's Date: 06/22/2021  PCP: Hoyt Koch, MD REFERRING PROVIDER: Hoyt Koch, *   PT End of Session - 06/22/21 1804     Visit Number 5    Number of Visits 12    Date for PT Re-Evaluation 08/06/21    PT Start Time 1700    PT Stop Time 2585    PT Time Calculation (min) 45 min    Equipment Utilized During Treatment Other (comment)    Activity Tolerance Patient tolerated treatment well    Behavior During Therapy WFL for tasks assessed/performed               Past Medical History:  Diagnosis Date   Acne    DIVERTICULITIS, HX OF    Epicondylitis syndrome of elbow    RIGHT    GERD (gastroesophageal reflux disease)    Hepatic cyst 2015   per MRI   HYPERGLYCEMIA    HYPERLIPIDEMIA    Hyperplastic colon polyp    Iliotibial band syndrome of right side    Liver cyst 2014   Melanoma ([place]) 2014   Excised -clear margins   OBESITY    Palpitations    Vitamin D deficiency    Past Surgical History:  Procedure Laterality Date   COLECTOMY  09-16-03   sigmoid   COLONOSCOPY  2014   COLONOSCOPY  27/78/2423   UMBILICAL LAPRPOSCOPY     AGE 74   Patient Active Problem List   Diagnosis Date Noted   Encounter for general adult medical examination with abnormal findings 05/14/2021   Multiple myeloma ([place]) 09/08/2020   Goals of care, counseling/discussion 09/08/2020   Lumbar radiculopathy 07/08/2020   Encounter for counseling 02/04/2020   Paresthesia 11/16/2010   HYPERGLYCEMIA 02/26/2007   Hyperlipidemia 02/20/2007   DIVERTICULITIS, HX OF 12/20/2006    REFERRING DIAG: : C90.00 (ICD-10-CM) - Multiple myeloma, remission status unspecified   THERAPY DIAG:  Low back pain, unspecified back pain laterality, unspecified chronicity, unspecified whether sciatica present  Pain in thoracic spine  Muscle weakness  (generalized)  Difficulty in walking, not elsewhere classified  PERTINENT HISTORY: Compression fractures at T3, T9-T12, and L1-L5.  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  MD instructed her to not perform resistance machines and she has a 10# lifting restriction  PRECAUTIONS: 10LB weight lifting limit  SUBJECTIVE: "Business trip for a week, walked through airport.  Pain overall decreased."  PAIN:  Are you having pain? Yes VAS scale: 6/10  worst;  2-4/10 usual Pain location: back Pain orientation: Bilateral  PAIN TYPE: aching Pain description: constant  Aggravating factors: Leaning forward, sitting, standing, extending from a flexed position Relieving factors: lying down with pillow propped under legs and arms, heat, ice, medications, CBD roll-on    OBJECTIVE:     HOME EXERCISE PROGRAM: None given: Will add when appropriate   ASSESSMENT:   CLINICAL IMPRESSION: Pt missed a few visit dues to a business trip.  She reports tolerating trip well, walking through airport and plane rides.  Pt completes exercises as directed with focus on core stretching and strengthening.  Added side plank for lateral flexion strengthening which pt has difficulty executing due to ms weakness. Also she is unable to tolerate reverse plank for posterior core strengthening due to back discomfort.  Goals reviewed and pt demonstrates good progress towards all.  Her worst pain is now a 6/10 from 8/10.  She is  reporting less discomfort with self care activities like dressing, showering and blowing hair dry completing them indep. She is able to stand and prepare food and amb through airport with less discomfort but continues to be limited.  Strength improving.  She will benefit from continued physical therapy to improve all fxn mobility and meet goals.      Objective impairments include decreased endurance, decreased strength, difficulty in walking, pain, Abnormal gait and decreased activity tolerance. These  impairments are limiting patient from cleaning, community activity, meal prep, occupation, laundry, shopping, and self care activities (dressing/bathing) and ambulation . Personal factors including 3+ comorbidities: Compression fractures at T3, T9-T12, and L1-L5.  Osteoporosis, and multiple myeloma  are also affecting patient's functional outcome. Patient will benefit from skilled PT to address above impairments and improve overall function.   REHAB POTENTIAL: Good   CLINICAL DECISION MAKING: Evolving/moderate complexity   EVALUATION COMPLEXITY: Moderate     GOALS:     SHORT TERM GOALS:   STG Name Target Date Goal status  1 Pt will tolerate the initiation of aquatic therapy without adverse effects for improved tolerance to daily activities and daily mobility.   Baseline:  05/26/2021 INITIAL Achieved 12/20  2 Pt's worst pain will be no > 8/10 for improved tolerance to daily mobility.  Baseline:  06/02/2021 INITIAL Achieved 12/20  3 Pt will report improved tolerance and reduced pain with self care activities including dressing and bathing. Baseline: 06/02/2021 INITIAL Achieved 12/20  4 Pt will demo improved quality of gait including increased reciprocal arm swing, pelvic rotation, and gait speed.  Baseline: 06/02/2021 INITIAL Partially met 12/20    LONG TERM GOALS:    LTG Name Target Date Goal status  1 Pt will be able to stand to blow dry her hair without significant pain or difficulty.  Baseline: 06/23/2021   INITIAL Achieved 12/20  2 Pt will be able to stand to cook and prepare food without significant difficulty or limitation Baseline: 06/23/2021 INITIAL Achieved 12/20  3 Pt will report at least a 60-70% improvement in tolerance with daily functional mobility.  Baseline: 06/23/2021 45% INITIAL Partially met 12/20  4 Pt will be able to ambulate community distance without significant pain.  Baseline: 06/23/2021  INITIAL Partially met 12/20  5 Pt will demo improved  strength to 4/5 in hip flexion bilat, 5/5 L knee strength, and WFL hip abd tested in sitting for improved tolerance with functional mobility skills and standing activities.  Baseline: 12/2102022 R4-; L3+ INITIAL Partially met 12/20      PLAN: PT FREQUENCY:  1 time per week   PT DURATION: 6 weeks  PLANNED INTERVENTIONS: Therapeutic exercises, Therapeutic activity, Neuro Muscular re-education, Balance training, Gait training, Patient/Family education, Stair training, and Aquatic Therapy    TODAY'S TREATMENT: 12-6  Pt seen for aquatic therapy today.  Treatment took place in water 3.25-4.8 ft in depth at the [facility] pool. Temp of water was 94.  Pt entered/exited the pool via stairs (step through pattern) independently with bilat rail.  Pt entered water for aquatic therapy for first time and was introduced to principles and therapeutic effects of water as she ambulated and acclimated to pool  Warm up: walking forward and back multiple widths and lengths of pool. Side stepping with squat 1 foam hand buoys submergered x 6 widths  Seated stretching Standing stretch lateral core right and left modified yoga position gentle x3 nbea side holding x 15 seconds  Exercises Core strengthening and balance Using blue noodle:  plank x3 holding for 30 seconds  sitting on blue noodle balanced with ue out of water. Progressed to pushing noodle down and balancing on noodle with extended arms. 3 x 30 sec.  Attempted reverse plank but not tolerated LB -modified plank with hip extension 2x5 -Side to side pendulum for stretch x 3-4, then exercise 2x 5 R/L using hand buoys   -Forward to back pendulum x5 with pause in prone for LB stretch   -Prone suspension flys x5   -Supine suspension Knees to chest x 10  Aerobic capacity: Seated flutter at hip 3 x 25   Pt requires buoyancy for support and to offload joints with strengthening exercises. Viscosity of the water is needed for resistance of  strengthening; water current perturbations provides challenge to standing balance unsupported, requiring increased core activation.      PLAN FOR NEXT SESSION: Progress core strengthening  PATIENT EDUCATION:  Properties of water; benefits and advantages of aquatic therapy    Stanton Kidney Tharon Aquas) Naome Brigandi MPT 06/22/2021, 6:33 PM

## 2021-06-22 NOTE — Progress Notes (Signed)
[place] OFFICE PROGRESS NOTE   Diagnosis: Multiple myeloma  INTERVAL HISTORY:   Ms. Waskey returns as scheduled.  She began another cycle of Revlimid yesterday.  No neuropathy symptoms.  No consistent nausea or diarrhea.  She continues to have back pain.  She is participating in physical therapy.  She has increased discomfort and a feeling as though her left "leg "may give away with certain movements.  She is working.  She recently traveled to [place] and [place].  Objective:  Vital signs in last 24 hours:  Blood pressure (!) 144/81, pulse 76, temperature 98.1 F (36.7 C), temperature source Oral, resp. rate 18, height 5' 3"  (1.6 m), weight 172 lb 6.4 oz (78.2 kg), last menstrual period 03/06/2011, SpO2 100 %.    HEENT: No thrush or ulcers Resp: Lungs clear bilaterally Cardio: Regular rate and rhythm GI: No hepatosplenomegaly Vascular: No leg edema   Lab Results:  Lab Results  Component Value Date   WBC 3.9 (L) 06/22/2021   HGB 14.4 06/22/2021   HCT 42.9 06/22/2021   MCV 91.7 06/22/2021   PLT 265 06/22/2021   NEUTROABS 2.2 06/22/2021    CMP  Lab Results  Component Value Date   NA 139 06/22/2021   K 3.6 06/22/2021   CL 102 06/22/2021   CO2 29 06/22/2021   GLUCOSE 145 (H) 06/22/2021   BUN 18 06/22/2021   CREATININE 0.79 06/22/2021   CALCIUM 9.5 06/22/2021   PROT 6.4 (L) 06/22/2021   ALBUMIN 4.6 06/22/2021   AST 16 06/22/2021   ALT 16 06/22/2021   ALKPHOS 37 (L) 06/22/2021   BILITOT 0.4 06/22/2021   GFRNONAA >60 06/22/2021   GFRAA 78 12/11/2006     Medications: I have reviewed the patient's current medications.   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 Increase serum free lambda light chains Serum M spike MRI pelvis 09/01/2020-innumerable small foci of signal  abnormality concerning for multiple myeloma involvement Metastatic bone survey 09/03/2020-no discrete lytic bone lesions, compression fractures at T12, L1, L2, and L3, osteopenia Cycle 1 RVD 09/14/2020 Daratumumab beginning 09/28/2020 Cycle 2 RVD 10/05/2020 plus daratumumab Week 3 daratumumab 10/12/2020 Normal lambda light chains 10/26/2020 Cycle 3 RVD plus daratumumab 10/26/2020 Cycle 4 RVD plus daratumumab 11/16/2020, daratumumab changed to an every 2-week schedule beginning 11/23/2020 Cycle 5 RVD plus daratumumab 12/07/2020 Cycle 6 RVD plus daratumumab 12/28/2020 Normal lambda light chains 01/05/2021 Revlimid maintenance 21/28 days beginning 01/18/2021 Stem cell harvest at [facility] 03/29/2021 Revlimid resumed 03/30/2021   2. Severe back pain MRI lumbar spine July 12, 2020-subacute compression fractures at T12 and L1 MRI lumbar spine July 30, 2020-subacute T12 and L1 compression fractures, new subacute superior L2 endplate deformity MRI lumbar spine September 01, 2020 new/progressive compression fractures at L2 and L3, acute compression fracture at L4, evolution of T12 and L1 compression fractures MRI pelvis September 01, 2020-innumerable small foci of signal abnormality in the pelvis and femurs, no fracture identified, concerning for multiple myeloma versus metastases Zometa beginning 09/21/2020-plan monthly x3 then every 3 months, next due August 2022 MRI thoracic spine July 2022-new T3 compression fracture with multiple additional thoracic compression fractures 3. Melanoma left upper back 2014 4. History of diverticulitis 5. Mild hypercalcemia at presentation-resolved 6. Osteoporosis 7. Evusheld 8.  Depression-worsened depression symptoms 01/11/2021, referred to psychology     Disposition: Katherine Beard appears stable.  She will  continue Revlimid maintenance.  The hemoglobin remains normal.  The serum free light chains were normal 05/24/2021.  We will follow-up on the light chains from today.  She will return for  an office and lab visit in 1 month.  She will be due for Zometa in 2 months.  [name], MD  06/22/2021  3:49 PM

## 2021-06-23 LAB — KAPPA/LAMBDA LIGHT CHAINS
Kappa free light chain: 7.9 mg/L (ref 3.3–19.4)
Kappa, lambda light chain ratio: 1.18 (ref 0.26–1.65)
Lambda free light chains: 6.7 mg/L (ref 5.7–26.3)

## 2021-06-25 LAB — PROTEIN ELECTROPHORESIS, SERUM
A/G Ratio: 1.9 — ABNORMAL HIGH (ref 0.7–1.7)
Albumin ELP: 4.2 g/dL (ref 2.9–4.4)
Alpha-1-Globulin: 0.2 g/dL (ref 0.0–0.4)
Alpha-2-Globulin: 0.7 g/dL (ref 0.4–1.0)
Beta Globulin: 1 g/dL (ref 0.7–1.3)
Gamma Globulin: 0.3 g/dL — ABNORMAL LOW (ref 0.4–1.8)
Globulin, Total: 2.2 g/dL (ref 2.2–3.9)
Total Protein ELP: 6.4 g/dL (ref 6.0–8.5)

## 2021-07-06 ENCOUNTER — Ambulatory Visit (HOSPITAL_BASED_OUTPATIENT_CLINIC_OR_DEPARTMENT_OTHER): Payer: 59 | Admitting: Physical Therapy

## 2021-07-07 ENCOUNTER — Other Ambulatory Visit: Payer: Self-pay | Admitting: Nurse Practitioner

## 2021-07-07 DIAGNOSIS — C9000 Multiple myeloma not having achieved remission: Secondary | ICD-10-CM

## 2021-07-07 MED ORDER — OXYCODONE HCL 5 MG PO TABS: 5.0000 mg | ORAL_TABLET | ORAL | 0 refills | Status: DC | PRN

## 2021-07-07 NOTE — Telephone Encounter (Signed)
Refill request--hard copy to desk.

## 2021-07-11 ENCOUNTER — Other Ambulatory Visit: Payer: Self-pay | Admitting: Oncology

## 2021-07-12 ENCOUNTER — Encounter: Payer: Self-pay | Admitting: Oncology

## 2021-07-12 ENCOUNTER — Telehealth: Payer: Self-pay

## 2021-07-12 NOTE — Telephone Encounter (Addendum)
TC from Pt stating she needs a refill on revlimid and was not feeling well  for a couple of days and tested positive for covid. Pt states she is also having right upper abdominal pain and has had it for a couple of weeks she stated its a dull ache and wanted to know if she should be concerned. Message given to Dr Benay Spice to advise. But informed Pt that we would have to move her appointment out to 10 days because of her covid positive status. Pt transferred to scheduling to reschedule appointment.

## 2021-07-13 ENCOUNTER — Ambulatory Visit (HOSPITAL_BASED_OUTPATIENT_CLINIC_OR_DEPARTMENT_OTHER): Payer: 59 | Admitting: Physical Therapy

## 2021-07-16 ENCOUNTER — Inpatient Hospital Stay: Payer: 59

## 2021-07-16 ENCOUNTER — Inpatient Hospital Stay: Payer: 59 | Admitting: Nurse Practitioner

## 2021-07-20 ENCOUNTER — Encounter: Payer: Self-pay | Admitting: Oncology

## 2021-07-23 ENCOUNTER — Inpatient Hospital Stay (HOSPITAL_BASED_OUTPATIENT_CLINIC_OR_DEPARTMENT_OTHER): Payer: 59 | Admitting: Oncology

## 2021-07-23 ENCOUNTER — Inpatient Hospital Stay: Payer: 59 | Attending: Oncology

## 2021-07-23 ENCOUNTER — Telehealth: Payer: Self-pay | Admitting: *Deleted

## 2021-07-23 ENCOUNTER — Other Ambulatory Visit: Payer: Self-pay

## 2021-07-23 VITALS — BP 128/79 | HR 60 | Temp 97.8°F | Resp 18 | Ht 63.0 in | Wt 170.0 lb

## 2021-07-23 DIAGNOSIS — F32A Depression, unspecified: Secondary | ICD-10-CM | POA: Diagnosis not present

## 2021-07-23 DIAGNOSIS — Z85820 Personal history of malignant melanoma of skin: Secondary | ICD-10-CM | POA: Diagnosis not present

## 2021-07-23 DIAGNOSIS — C9000 Multiple myeloma not having achieved remission: Secondary | ICD-10-CM

## 2021-07-23 DIAGNOSIS — M810 Age-related osteoporosis without current pathological fracture: Secondary | ICD-10-CM | POA: Insufficient documentation

## 2021-07-23 DIAGNOSIS — D709 Neutropenia, unspecified: Secondary | ICD-10-CM | POA: Diagnosis not present

## 2021-07-23 DIAGNOSIS — R1011 Right upper quadrant pain: Secondary | ICD-10-CM | POA: Insufficient documentation

## 2021-07-23 DIAGNOSIS — M549 Dorsalgia, unspecified: Secondary | ICD-10-CM | POA: Diagnosis not present

## 2021-07-23 LAB — CMP (CANCER CENTER ONLY)
ALT: 13 U/L (ref 0–44)
AST: 13 U/L — ABNORMAL LOW (ref 15–41)
Albumin: 4.4 g/dL (ref 3.5–5.0)
Alkaline Phosphatase: 30 U/L — ABNORMAL LOW (ref 38–126)
Anion gap: 8 (ref 5–15)
BUN: 18 mg/dL (ref 6–20)
CO2: 27 mmol/L (ref 22–32)
Calcium: 9.3 mg/dL (ref 8.9–10.3)
Chloride: 102 mmol/L (ref 98–111)
Creatinine: 0.79 mg/dL (ref 0.44–1.00)
GFR, Estimated: >60 mL/min (ref >60)
Glucose, Bld: 103 mg/dL — ABNORMAL HIGH (ref 70–99)
Potassium: 4.6 mmol/L (ref 3.5–5.1)
Sodium: 137 mmol/L (ref 135–145)
Total Bilirubin: 0.6 mg/dL (ref 0.3–1.2)
Total Protein: 6.2 g/dL — ABNORMAL LOW (ref 6.5–8.1)

## 2021-07-23 LAB — CBC WITH DIFFERENTIAL (CANCER CENTER ONLY)
Abs Immature Granulocytes: 0.01 10*3/uL (ref 0.00–0.07)
Basophils Absolute: 0.1 10*3/uL (ref 0.0–0.1)
Basophils Relative: 3 %
Eosinophils Absolute: 0.1 10*3/uL (ref 0.0–0.5)
Eosinophils Relative: 4 %
HCT: 43.3 % (ref 36.0–46.0)
Hemoglobin: 14.7 g/dL (ref 12.0–15.0)
Immature Granulocytes: 0 %
Lymphocytes Relative: 26 %
Lymphs Abs: 0.7 10*3/uL (ref 0.7–4.0)
MCH: 30.6 pg (ref 26.0–34.0)
MCHC: 33.9 g/dL (ref 30.0–36.0)
MCV: 90 fL (ref 80.0–100.0)
Monocytes Absolute: 0.4 10*3/uL (ref 0.1–1.0)
Monocytes Relative: 16 %
Neutro Abs: 1.4 10*3/uL — ABNORMAL LOW (ref 1.7–7.7)
Neutrophils Relative %: 51 %
Platelet Count: 251 10*3/uL (ref 150–400)
RBC: 4.81 MIL/uL (ref 3.87–5.11)
RDW: 13.2 % (ref 11.5–15.5)
WBC Count: 2.8 10*3/uL — ABNORMAL LOW (ref 4.0–10.5)
nRBC: 0 % (ref 0.0–0.2)

## 2021-07-23 MED ORDER — DIAZEPAM 5 MG PO TABS: 5.0000 mg | ORAL_TABLET | Freq: Two times a day (BID) | ORAL | 0 refills | Status: DC | PRN

## 2021-07-23 MED ORDER — OXYCODONE HCL 5 MG PO TABS: 5.0000 mg | ORAL_TABLET | ORAL | 0 refills | Status: DC | PRN

## 2021-07-23 NOTE — Telephone Encounter (Signed)
Called patient w/abdominal US appointment for 07/27/21 at 0845/0900 at [facility]. NPO after midnight. She is not able to go on 07/27/21. Provided her phone # for central scheduling to reschedule.

## 2021-07-23 NOTE — Progress Notes (Signed)
[place] OFFICE PROGRESS NOTE   Diagnosis: Multiple myeloma  INTERVAL HISTORY:   Katherine Beard returns as scheduled.  She began another cycle of Revlimid this week.  She has intermittent diarrhea while on Revlimid.  She continues to have back pain.  The pain is increased since she began exercise.  She saw Dr. Saintclair Halsted and is being scheduled for an MRI.  She has bilateral hip pain that sometimes wakes her up at night.  She is taking 2-4 oxycodone tablets per day for relief of pain. She reports right upper quadrant pain for the past 3 weeks.  This pain is similar to when she was diagnosed with a "liver cyst "years ago.  She has mild nausea.  The nausea is not associated with eating.  She was diagnosed with COVID-19 approximately 2 weeks ago.  Objective:  Vital signs in last 24 hours:  Blood pressure 128/79, pulse 60, temperature 97.8 F (36.6 C), temperature source Oral, resp. rate 18, height 5' 3"  (1.6 m), weight 170 lb (77.1 kg), last menstrual period 03/06/2011, SpO2 100 %.    HEENT: No thrush Resp: Lungs clear bilaterally Cardio: Regular rate and rhythm GI: No hepatosplenomegaly, no mass, soft.  Tender in the right subcostal region Vascular: No leg edema   Lab Results:  Lab Results  Component Value Date   WBC 2.8 (L) 07/23/2021   HGB 14.7 07/23/2021   HCT 43.3 07/23/2021   MCV 90.0 07/23/2021   PLT 251 07/23/2021   NEUTROABS 1.4 (L) 07/23/2021    CMP  Lab Results  Component Value Date   NA 137 07/23/2021   K 4.6 07/23/2021   CL 102 07/23/2021   CO2 27 07/23/2021   GLUCOSE 103 (H) 07/23/2021   BUN 18 07/23/2021   CREATININE 0.79 07/23/2021   CALCIUM 9.3 07/23/2021   PROT 6.2 (L) 07/23/2021   ALBUMIN 4.4 07/23/2021   AST 13 (L) 07/23/2021   ALT 13 07/23/2021   ALKPHOS 30 (L) 07/23/2021   BILITOT 0.6 07/23/2021   GFRNONAA >60 07/23/2021   GFRAA 78 12/11/2006    Medications: I have reviewed the patient's current  medications.   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 Increase serum free lambda light chains Serum M spike MRI pelvis 09/01/2020-innumerable small foci of signal abnormality concerning for multiple myeloma involvement Metastatic bone survey 09/03/2020-no discrete lytic bone lesions, compression fractures at T12, L1, L2, and L3, osteopenia Cycle 1 RVD 09/14/2020 Daratumumab beginning 09/28/2020 Cycle 2 RVD 10/05/2020 plus daratumumab Week 3 daratumumab 10/12/2020 Normal lambda light chains 10/26/2020 Cycle 3 RVD plus daratumumab 10/26/2020 Cycle 4 RVD plus daratumumab 11/16/2020, daratumumab changed to an every 2-week schedule beginning 11/23/2020 Cycle 5 RVD plus daratumumab 12/07/2020 Cycle 6 RVD plus daratumumab 12/28/2020 Normal lambda light chains 01/05/2021 Revlimid maintenance 21/28 days beginning 01/18/2021 Stem cell harvest at [facility] 03/29/2021 Revlimid resumed 03/30/2021   2. Severe back pain MRI lumbar spine July 12, 2020-subacute compression fractures at T12 and L1 MRI lumbar spine July 30, 2020-subacute T12 and L1 compression fractures, new subacute superior L2 endplate deformity MRI lumbar spine September 01, 2020 new/progressive compression fractures at L2 and L3, acute compression fracture at L4, evolution of T12 and L1 compression fractures MRI pelvis September 01, 2020-innumerable small foci of signal abnormality in the pelvis and femurs, no fracture identified, concerning for multiple myeloma versus metastases Zometa beginning 09/21/2020-plan  monthly x3 then every 3 months, next due August 2022 MRI thoracic spine July 2022-new T3 compression fracture with multiple additional thoracic compression fractures 3. Melanoma left upper back 2014 4. History of diverticulitis 5. Mild hypercalcemia at presentation-resolved 6.  Osteoporosis 7. Evusheld 8.  Depression-worsened depression symptoms 01/11/2021, referred to psychology     Disposition: Katherine Beard appears unchanged.  She is in clinical remission from myeloma.  The serum free light chains were normal when she was here last month.  She will continue Revlimid maintenance.  She has mild neutropenia today.  The neutropenia is likely secondary to Revlimid.  She will return for a CBC in 2 weeks.  Katherine Beard will be scheduled for an office visit and Zometa on 08/23/2021.  She has chronic back pain secondary to injury from compression fractures.  She is followed by neurosurgery.  I suspect the bilateral hip discomfort is related to her back.  The etiology of the right upper quadrant pain is unclear.  She will be referred for an abdominal ultrasound.  I recommend she follow-up with Dr. Sharlet Salina or Dr. Silverio Decamp.  [name], MD  07/23/2021  9:20 AM

## 2021-07-25 ENCOUNTER — Other Ambulatory Visit: Payer: Self-pay | Admitting: Nurse Practitioner

## 2021-07-25 DIAGNOSIS — C9000 Multiple myeloma not having achieved remission: Secondary | ICD-10-CM

## 2021-07-26 ENCOUNTER — Other Ambulatory Visit: Payer: Self-pay | Admitting: Oncology

## 2021-07-26 DIAGNOSIS — C9000 Multiple myeloma not having achieved remission: Secondary | ICD-10-CM

## 2021-07-26 LAB — KAPPA/LAMBDA LIGHT CHAINS
Kappa free light chain: 9 mg/L (ref 3.3–19.4)
Kappa, lambda light chain ratio: 1.2 (ref 0.26–1.65)
Lambda free light chains: 7.5 mg/L (ref 5.7–26.3)

## 2021-07-27 ENCOUNTER — Ambulatory Visit (HOSPITAL_BASED_OUTPATIENT_CLINIC_OR_DEPARTMENT_OTHER): Payer: 59

## 2021-07-29 ENCOUNTER — Encounter: Payer: Self-pay | Admitting: Oncology

## 2021-07-29 ENCOUNTER — Other Ambulatory Visit: Payer: Self-pay

## 2021-07-29 ENCOUNTER — Ambulatory Visit (HOSPITAL_BASED_OUTPATIENT_CLINIC_OR_DEPARTMENT_OTHER)
Admission: RE | Admit: 2021-07-29 | Discharge: 2021-07-29 | Disposition: A | Discharge status: Home / Self Care | Payer: 59 | Source: Ambulatory Visit | Attending: Oncology | Admitting: Oncology

## 2021-07-29 DIAGNOSIS — C9000 Multiple myeloma not having achieved remission: Secondary | ICD-10-CM | POA: Diagnosis present

## 2021-07-30 ENCOUNTER — Other Ambulatory Visit: Payer: Self-pay | Admitting: *Deleted

## 2021-07-30 ENCOUNTER — Encounter: Payer: Self-pay | Admitting: Oncology

## 2021-07-30 DIAGNOSIS — C9000 Multiple myeloma not having achieved remission: Secondary | ICD-10-CM

## 2021-07-30 MED ORDER — DIAZEPAM 5 MG PO TABS: 5.0000 mg | ORAL_TABLET | Freq: Two times a day (BID) | ORAL | 0 refills | Status: DC | PRN

## 2021-07-30 NOTE — Telephone Encounter (Signed)
Pharmacy did not have record of the valium refill called in on 07/23/21. Spoke w/pharmacist and provided refill approval.

## 2021-07-30 NOTE — Telephone Encounter (Signed)
V/M left with results of ultrasound. Message left for Pt to return call if any further questions.

## 2021-07-30 NOTE — Telephone Encounter (Signed)
-----   Message from Ladell Pier, MD sent at 07/30/2021  2:52 PM EST ----- Please call patient, abdominal ultrasound is negative, no evidence of gallbladder disease.  Liver cyst is no longer visualized, follow-up as scheduled

## 2021-08-03 ENCOUNTER — Telehealth: Payer: Self-pay | Admitting: Gastroenterology

## 2021-08-03 NOTE — Telephone Encounter (Signed)
Spoke with the patient. She has a constant dull aching pain under her right rib area. The Oncologist had an abdominal u/s done which patient reports was normal. She has belching and stomach upset when she has to takes the Lenalidomide. She does experience diarrhea with it as well.  Appointment offered for 08/06/21. Patient declines due to her employment. Next available is 08/17/21 with Dr Silverio Decamp. Accepts this.

## 2021-08-03 NOTE — Telephone Encounter (Signed)
Patient is in extreme upper right abdominal pain for about 3 weeks now.  She had an ultrasound that showed her gallbladder was OK and that the cyst she had previously is gone, so they wanted her to see her GI doc asap.  Dr. Silverio Decamp doesn't have any available appointments until March and the apps currently don't have anything either.  Patient is requesting an appointment asap.  Please call patient and advise.  Thank you.

## 2021-08-06 ENCOUNTER — Inpatient Hospital Stay: Payer: 59 | Attending: Oncology

## 2021-08-06 ENCOUNTER — Other Ambulatory Visit: Payer: Self-pay

## 2021-08-06 ENCOUNTER — Ambulatory Visit: Payer: 59 | Admitting: Gastroenterology

## 2021-08-06 DIAGNOSIS — Z85820 Personal history of malignant melanoma of skin: Secondary | ICD-10-CM | POA: Insufficient documentation

## 2021-08-06 DIAGNOSIS — C9000 Multiple myeloma not having achieved remission: Secondary | ICD-10-CM | POA: Diagnosis not present

## 2021-08-06 LAB — CBC WITH DIFFERENTIAL (CANCER CENTER ONLY)
Abs Immature Granulocytes: 0.01 10*3/uL (ref 0.00–0.07)
Basophils Absolute: 0.1 10*3/uL (ref 0.0–0.1)
Basophils Relative: 2 %
Eosinophils Absolute: 0.2 10*3/uL (ref 0.0–0.5)
Eosinophils Relative: 6 %
HCT: 42.9 % (ref 36.0–46.0)
Hemoglobin: 14.2 g/dL (ref 12.0–15.0)
Immature Granulocytes: 0 %
Lymphocytes Relative: 24 %
Lymphs Abs: 0.7 10*3/uL (ref 0.7–4.0)
MCH: 30.1 pg (ref 26.0–34.0)
MCHC: 33.1 g/dL (ref 30.0–36.0)
MCV: 90.9 fL (ref 80.0–100.0)
Monocytes Absolute: 0.4 10*3/uL (ref 0.1–1.0)
Monocytes Relative: 14 %
Neutro Abs: 1.5 10*3/uL — ABNORMAL LOW (ref 1.7–7.7)
Neutrophils Relative %: 54 %
Platelet Count: 187 10*3/uL (ref 150–400)
RBC: 4.72 MIL/uL (ref 3.87–5.11)
RDW: 13.1 % (ref 11.5–15.5)
WBC Count: 2.9 10*3/uL — ABNORMAL LOW (ref 4.0–10.5)
nRBC: 0 % (ref 0.0–0.2)

## 2021-08-08 ENCOUNTER — Other Ambulatory Visit: Payer: Self-pay | Admitting: Oncology

## 2021-08-08 IMAGING — MR MR LUMBAR SPINE W/O CM
4 of 5 series · 26 of 48 positions shown · non-contrast
Comparison: 06/24/2020 radiograph

CLINICAL DATA: Low back pain radiating to the waist bilaterally for
5 weeks.

EXAM:
MRI LUMBAR SPINE WITHOUT CONTRAST
TECHNIQUE: Multiplanar, multisequence MR imaging of the lumbar spine was
performed. No intravenous contrast was administered.

[Series 4: T2 · sagittal · 4.0mm · 0.81mm/px · 6 of 17 slices shown (1 of 2)]
[im 1/17]
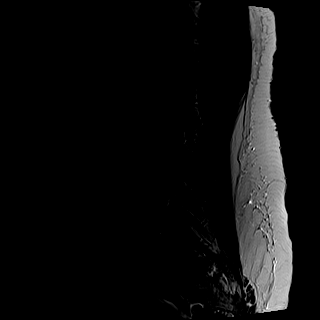
[im 4/17]
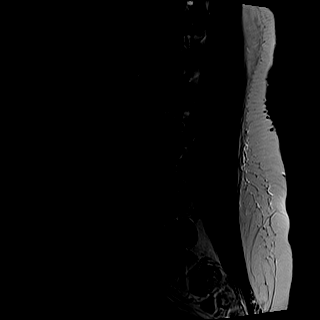
[im 7/17]
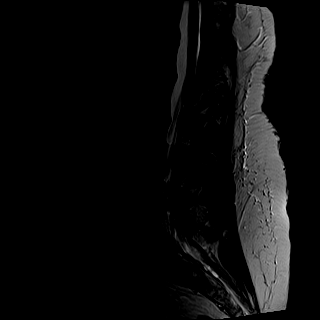
[im 10/17]
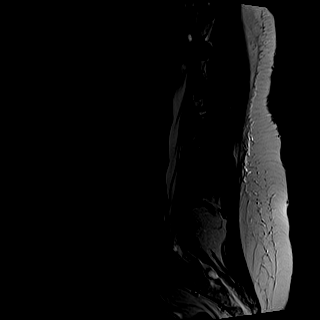
[im 13/17]
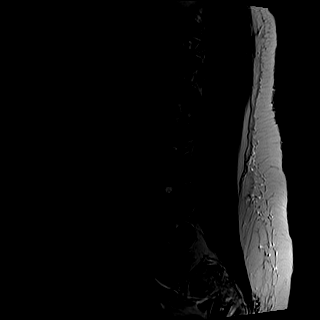
[im 17/17]
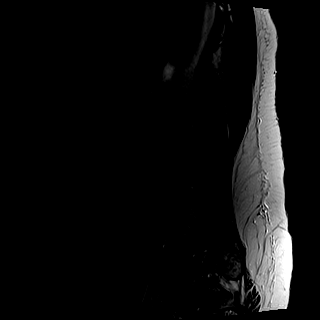

[Series 5: T1 · sagittal · 4.0mm · 0.41mm/px · 6 of 17 slices shown (1 of 2)]
[im 1/17]
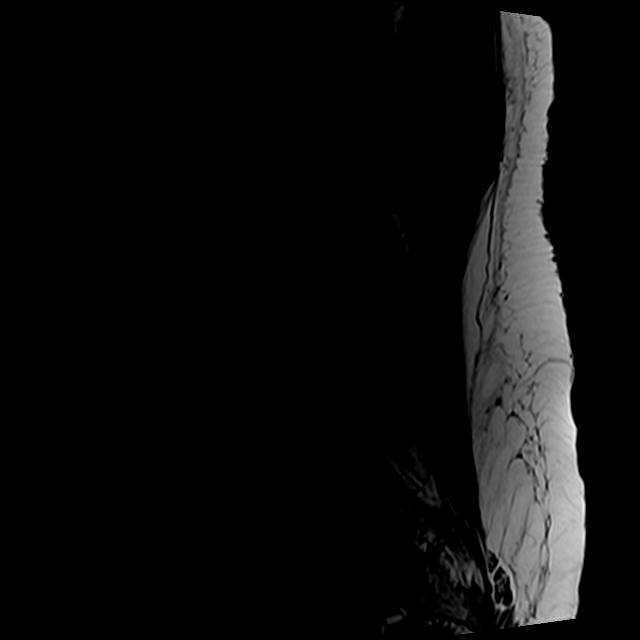
[im 4/17]
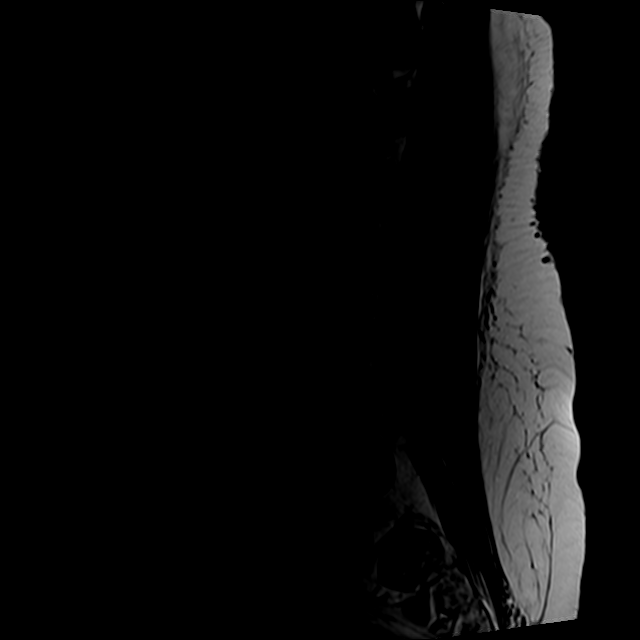
[im 7/17]
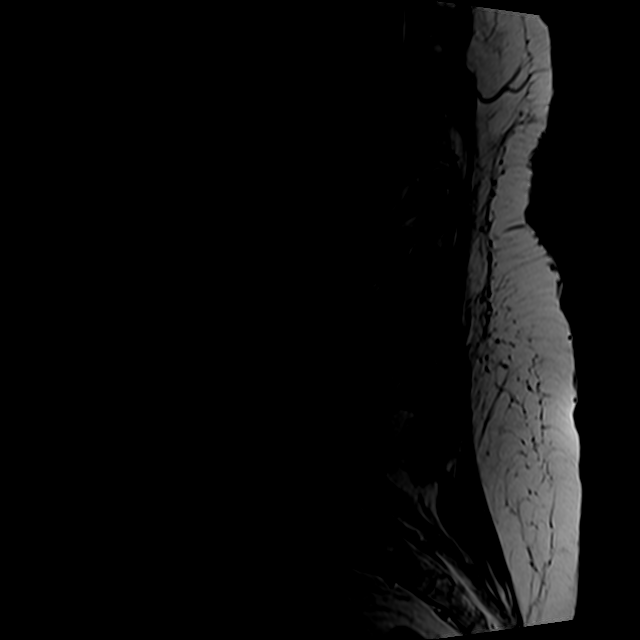
[im 10/17]
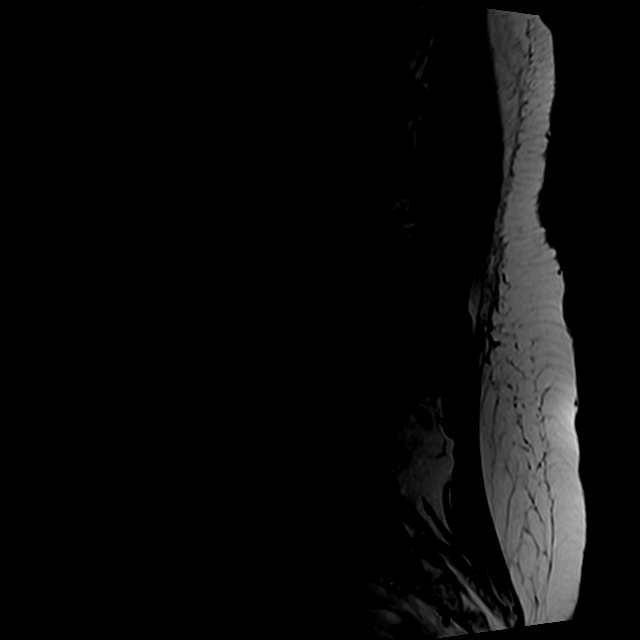
[im 13/17]
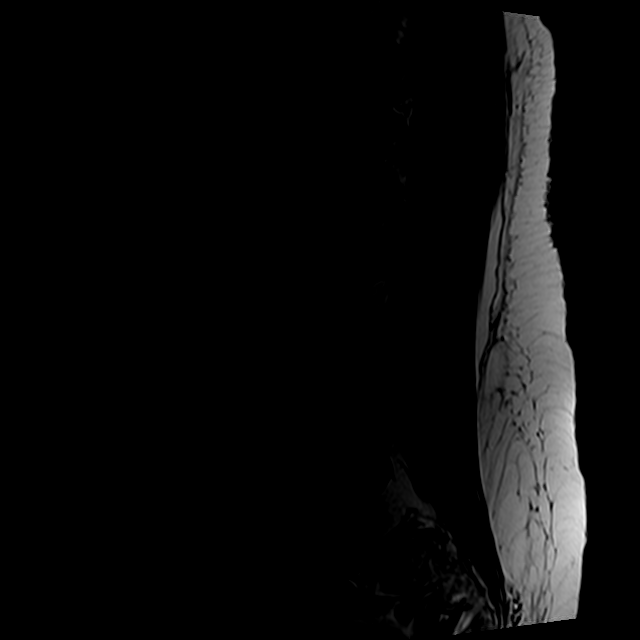
[im 17/17]
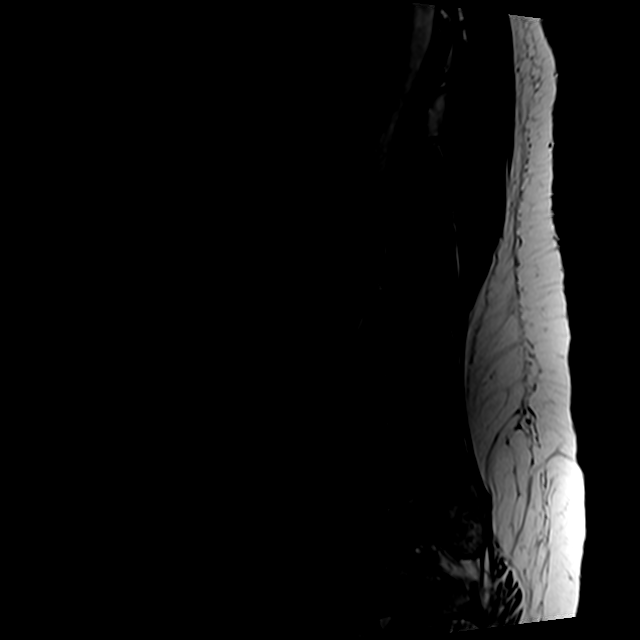

[Series 7: T2 · axial · 4.0mm · 0.78mm/px · z∈[-184,+41]mm · 9 of 40 slices shown (2 of 2)]
[im 1/40]
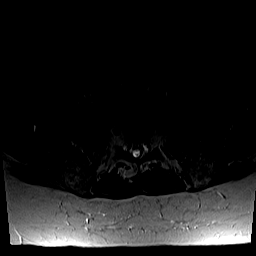
[im 6/40]
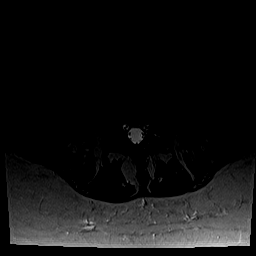
[im 12/40]
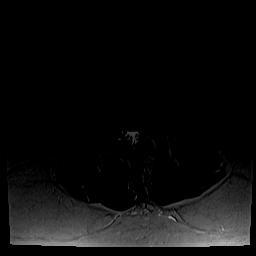
[im 17/40]
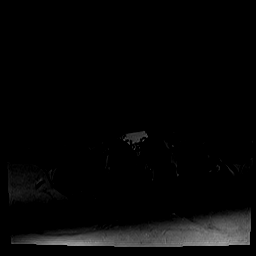
[im 20/40]
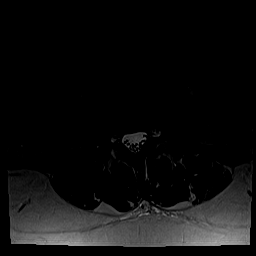
[im 23/40]
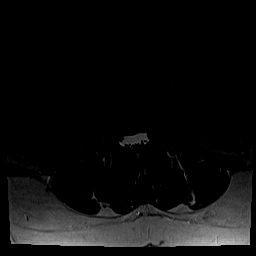
[im 28/40]
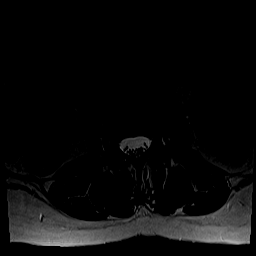
[im 34/40]
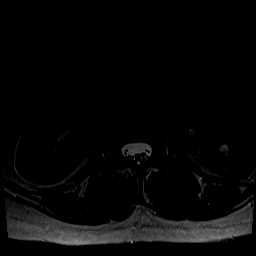
[im 40/40]
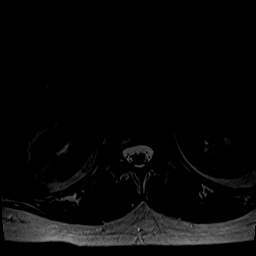

[Series 8: T1 · axial · 4.0mm · 0.39mm/px · z∈[-184,+11]mm · 5 of 40 slices shown (2 of 2)]
[im 1/40]
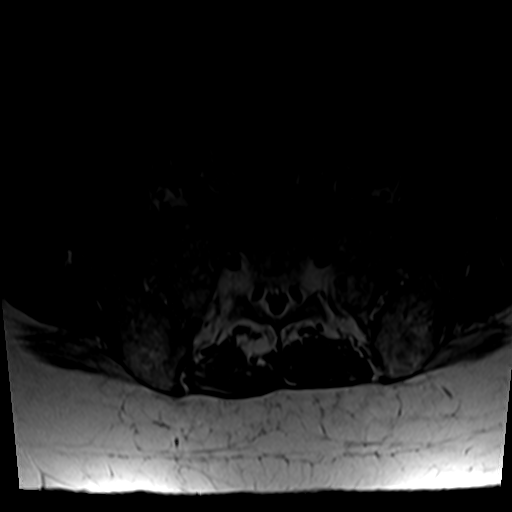
[im 6/40]
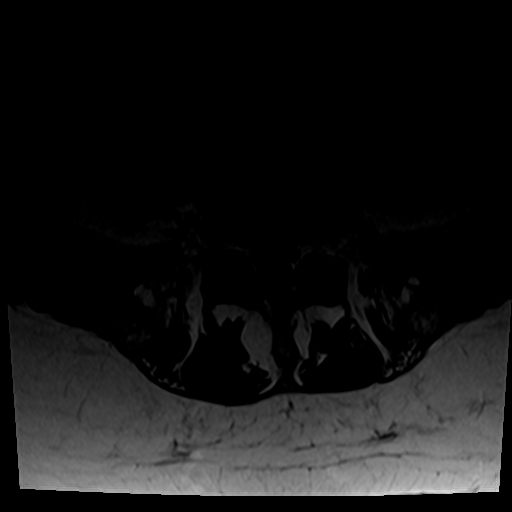
[im 12/40]
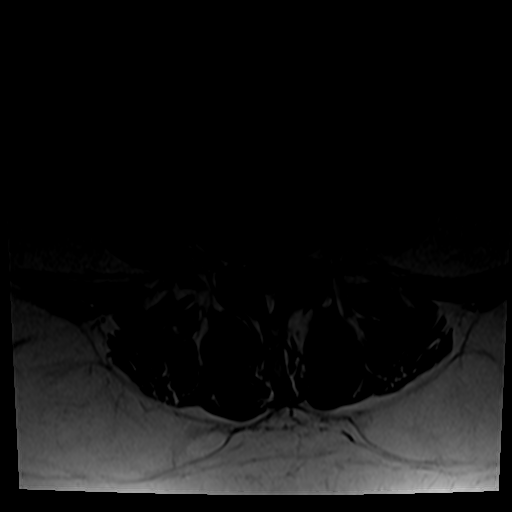
[im 20/40]
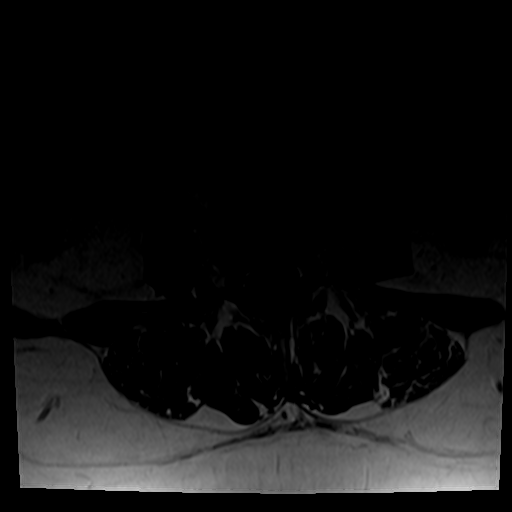
[im 34/40]
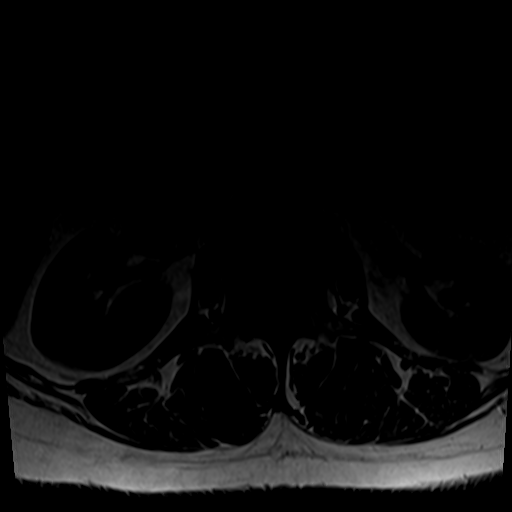

[26 of 48 positions shown; findings below may reference images not displayed]

FINDINGS: Segmentation:  5 lumbar type vertebrae

Alignment:  Slight dextrocurvature

Vertebrae: Round, focal edematous type signal touching the endplate
at the posteroinferior corner of T12, with slight irregularity along
the endplate, favor acute Schmorl's node. There are subtle
hypointense bands following the depressed inferior endplates of T12
and L1, with subtle STIR hyperintensity more readily seen at T12.

Subcentimeter bone lesion at the posterosuperior corner of L3 which
was subtly visualized on abdominal MRI from 7876, thus considered
benign and likely an atypical hemangioma (see coronal postcontrast
imaging, series 15).

Finally stippled appearance of bone marrow diffusely, likely benign
and incidental given normal protein on recent labs. This may reflect
islands of red marrow in the setting of anemia.

Conus medullaris and cauda equina: Conus extends to the L1-2 level.
Conus and cauda equina appear normal.

Paraspinal and other soft tissues: No perispinal mass or
inflammation is seen

Disc levels:

Mild disc desiccation, narrowing, and bulging at L2-3 to L4-5, with
annular fissuring seen at L4-5. At L4-5 is the greatest disc bulging
which encroaches on the left more than right subarticular recess.
Negative facets and diffusely patent foramina.
IMPRESSION: 1. Subacute compression fractures at T12 and L1. Rounded area of
edematous signal at the posteroinferior corner of T12, favor related
recent Schmorl's node.
2. Notable disc degeneration at L4-5 where there is annular fissure
and bulging impinging on the left more than right subarticular
recesses.

## 2021-08-09 ENCOUNTER — Encounter: Payer: Self-pay | Admitting: Oncology

## 2021-08-09 ENCOUNTER — Encounter: Payer: Self-pay | Admitting: *Deleted

## 2021-08-17 ENCOUNTER — Encounter: Payer: Self-pay | Admitting: Oncology

## 2021-08-17 ENCOUNTER — Ambulatory Visit (INDEPENDENT_AMBULATORY_CARE_PROVIDER_SITE_OTHER): Payer: 59 | Admitting: Gastroenterology

## 2021-08-17 ENCOUNTER — Encounter: Payer: Self-pay | Admitting: Gastroenterology

## 2021-08-17 VITALS — BP 110/70 | HR 52 | Ht 62.5 in | Wt 173.0 lb

## 2021-08-17 DIAGNOSIS — C9000 Multiple myeloma not having achieved remission: Secondary | ICD-10-CM | POA: Diagnosis not present

## 2021-08-17 DIAGNOSIS — R1011 Right upper quadrant pain: Secondary | ICD-10-CM | POA: Diagnosis not present

## 2021-08-17 DIAGNOSIS — R0781 Pleurodynia: Secondary | ICD-10-CM

## 2021-08-17 NOTE — Patient Instructions (Signed)
Follow up as needed  If you are age 59 or older, your body mass index should be between 23-30. Your Body mass index is 31.14 kg/m. If this is out of the aforementioned range listed, please consider follow up with your Primary Care Provider.  If you are age 51 or younger, your body mass index should be between 19-25. Your Body mass index is 31.14 kg/m. If this is out of the aformentioned range listed, please consider follow up with your Primary Care Provider.   ________________________________________________________  The [place] [facility] providers would like to encourage you to use [facility] to communicate with providers for non-urgent requests or questions.  Due to long hold times on the telephone, sending your provider a message by [facility] may be a faster and more efficient way to get a response.  Please allow 48 business hours for a response.  Please remember that this is for non-urgent requests.  _______________________________________________________   I appreciate the  opportunity to care for you  Thank You   Harl Bowie , MD

## 2021-08-17 NOTE — Progress Notes (Addendum)
Katherine APOLLO    [id number]    12-06-1960  Primary Care Physician:Crawford, Real Cons, MD  Referring Physician: Hoyt Koch, MD [address] [place],  [place] [address]   Chief complaint: Right-sided rib pain  HPI: 61 year old very pleasant female with history of multiple myeloma currently on active therapy with Revlimid is here for follow-up visit for right-sided rib pain  She has constant aching pain on the right side ribs more towards the back, no relieving factors.  Denies any abdominal pain, nausea, vomiting, change in bowel habits, melena or rectal bleeding. She has mild diarrhea soon after chemotherapy but that self resolves after few days No heartburn or dysphagia.  She has history of compression fractures in thoracic spine  Abdominal ultrasound July 29, 2021: Unremarkable Colonoscopy September 19, 2019: Diverticulosis and hemorrhoids otherwise normal exam. Outpatient Encounter Medications as of 08/17/2021  Medication Sig   acyclovir (ZOVIRAX) 400 MG tablet TAKE 1 TABLET BY MOUTH TWICE A DAY   aspirin EC 81 MG tablet Take 81 mg by mouth daily. Swallow whole.   Calcium Carb-Cholecalciferol [phone] MG-UNIT CAPS Take 2 capsules by mouth daily.   Cholecalciferol (VITAMIN D3 PO) Take 1,000 Units by mouth daily.   COVID-19 mRNA bivalent vaccine, Pfizer, (PFIZER COVID-19 VAC BIVALENT) injection Inject into the muscle.   COVID-19 mRNA Vac-TriS, Pfizer, (PFIZER-BIONT COVID-19 VAC-TRIS) SUSP injection Inject into the muscle.   diazepam (VALIUM) 5 MG tablet Take 1 tablet (5 mg total) by mouth 2 (two) times daily as needed for anxiety or muscle spasms. Do not drive   diphenhydramine-acetaminophen (TYLENOL PM) 25-500 MG TABS tablet Take 2 tablets by mouth as needed.   escitalopram (LEXAPRO) 5 MG tablet Take 1 tablet (5 mg total) by mouth daily.   Ibuprofen 200 MG CAPS Take 600 mg by mouth as needed.   influenza vac split quadrivalent PF (FLUARIX) 0.5 ML  injection Inject into the muscle.   lenalidomide (REVLIMID) 10 MG capsule Take 1 capsule (10 mg total) by mouth daily. Take for 21 days on and 7 days off. Repeat every 28 days. Start cycle on 08/16/2021   melatonin 5 MG TABS Take 10 mg by mouth at bedtime.   methocarbamol (ROBAXIN) 500 MG tablet TAKE 1 TABLET BY MOUTH TWICE A DAY AS NEEDED FOR MUSCLE SPASM   Multiple Vitamins-Minerals (HAIR SKIN AND NAILS FORMULA PO) Take 1 tablet by mouth daily.   Nutritional Supplements (VITAL HIGH PROTEIN PO) Take 1 Scoop by mouth daily. Puts in coffee   oxyCODONE (OXY IR/ROXICODONE) 5 MG immediate release tablet Take 1-2 tablets (5-10 mg total) by mouth every 4 (four) hours as needed.   Turmeric 500 MG CAPS Take 1,000 mg by mouth daily.   VITAMIN E NATURAL PO Take 1 capsule by mouth daily.   [DISCONTINUED] loratadine (CLARITIN) 10 MG tablet Take 10 mg by mouth daily. (Patient not taking: Reported on 07/23/2021)   [DISCONTINUED] ondansetron (ZOFRAN) 8 MG tablet Take 1 tablet (8 mg total) by mouth every 8 (eight) hours as needed for nausea or vomiting. (Patient not taking: Reported on 05/24/2021)   [DISCONTINUED] prochlorperazine (COMPAZINE) 10 MG tablet Take 1 tablet (10 mg total) by mouth every 6 (six) hours as needed. (Patient not taking: Reported on 01/26/2021)   No facility-administered encounter medications on file as of 08/17/2021.    Allergies as of 08/17/2021 - Review Complete 08/17/2021  Allergen Reaction Noted   Penicillins Hives 12/20/2006    Past  Medical History:  Diagnosis Date   Acne    DIVERTICULITIS, HX OF    Epicondylitis syndrome of elbow    RIGHT    GERD (gastroesophageal reflux disease)    Hepatic cyst 2015   per MRI   HYPERGLYCEMIA    HYPERLIPIDEMIA    Hyperplastic colon polyp    Iliotibial band syndrome of right side    Liver cyst 2014   Melanoma ([place]) 2014   Excised -clear margins   Multiple myeloma ([place])    OBESITY    Palpitations    Vitamin D deficiency     Past  Surgical History:  Procedure Laterality Date   COLECTOMY  09-16-03   sigmoid   COLONOSCOPY  2014   COLONOSCOPY  53/97/6734   UMBILICAL LAPRPOSCOPY     AGE 31    Family History  Problem Relation Age of Onset   Diabetes Mother    Hypertension Mother    Heart disease Mother        pacemaker   Heart disease Paternal Grandfather    Breast cancer Maternal Aunt    Colon cancer Neg Hx    Colon polyps Neg Hx    Esophageal cancer Neg Hx    Rectal cancer Neg Hx    Stomach cancer Neg Hx     Social History   Socioeconomic History   Marital status: Married    Spouse name: Not on file   Number of children: 2   Years of education: Not on file   Highest education level: Not on file  Occupational History   Occupation: VP of sales  Tobacco Use   Smoking status: Never   Smokeless tobacco: Never  Vaping Use   Vaping Use: Never used  Substance and Sexual Activity   Alcohol use: Yes    Alcohol/week: 1.0 standard drink    Types: 1 drink(s) per week    Comment: wine per week   Drug use: No   Sexual activity: Not on file  Other Topics Concern   Not on file  Social History Narrative   Not on file   Social Determinants of Health   Financial Resource Strain: Not on file  Food Insecurity: Not on file  Transportation Needs: Not on file  Physical Activity: Not on file  Stress: Not on file  Social Connections: Not on file  Intimate Partner Violence: Not on file      Review of systems: All other review of systems negative except as mentioned in the HPI.   Physical Exam: Vitals:   08/17/21 1554  BP: 110/70  Pulse: (!) 52   Body mass index is 31.14 kg/m. Gen:      No acute distress HEENT:  sclera anicteric Abd:      soft, non-tender; no palpable masses, no distension Ext:    No edema Neuro: alert and oriented x 3 Psych: normal mood and affect  Data Reviewed:  Reviewed labs, radiology imaging, old records and pertinent past GI work up   Assessment and  Plan/Recommendations:  61 year old very pleasant female with multiple myeloma, compression fractures in the thoracolumbar spine with complaints of right side rib pain concerning for possible radiculopathy or nerve impingement  Endoscopic evaluation is likely to be low yield given the location of her pain  She had MRI whole-body scan last week, results are not available yet in epic.  We will hold off further imaging until we review the MRI results  Continue to monitor Follow-up with Dr. Benay Spice  Return as  needed  This visit required 40 minutes of patient care (this includes precharting, chart review, review of results, face-to-face time used for counseling as well as treatment plan and follow-up. The patient was provided an opportunity to ask questions and all were answered. The patient agreed with the plan and demonstrated an understanding of the instructions.  Damaris Hippo , MD    CC: Hoyt Koch, *  Addendum Reviewed MRI thoracolumbar spine report, she has new compression fracture at T3 corresponding to the location of the referred pain in the back on the right side.  Will need to exclude nerve impingement or radiculopathy. Follow-up with neuro spine specialist

## 2021-08-19 ENCOUNTER — Encounter: Payer: Self-pay | Admitting: Gastroenterology

## 2021-08-23 ENCOUNTER — Other Ambulatory Visit: Payer: Self-pay

## 2021-08-23 ENCOUNTER — Inpatient Hospital Stay: Payer: 59 | Admitting: Oncology

## 2021-08-23 ENCOUNTER — Inpatient Hospital Stay: Payer: 59

## 2021-08-23 VITALS — BP 124/78 | HR 60 | Resp 17

## 2021-08-23 VITALS — BP 131/79 | HR 79 | Temp 97.8°F | Resp 19 | Ht 62.0 in | Wt 173.4 lb

## 2021-08-23 DIAGNOSIS — M549 Dorsalgia, unspecified: Secondary | ICD-10-CM

## 2021-08-23 DIAGNOSIS — F32A Depression, unspecified: Secondary | ICD-10-CM | POA: Diagnosis not present

## 2021-08-23 DIAGNOSIS — Z85828 Personal history of other malignant neoplasm of skin: Secondary | ICD-10-CM

## 2021-08-23 DIAGNOSIS — C9000 Multiple myeloma not having achieved remission: Secondary | ICD-10-CM

## 2021-08-23 DIAGNOSIS — M810 Age-related osteoporosis without current pathological fracture: Secondary | ICD-10-CM | POA: Diagnosis not present

## 2021-08-23 DIAGNOSIS — Z8719 Personal history of other diseases of the digestive system: Secondary | ICD-10-CM

## 2021-08-23 LAB — CMP (CANCER CENTER ONLY)
ALT: 16 U/L (ref 0–44)
AST: 14 U/L — ABNORMAL LOW (ref 15–41)
Albumin: 4.2 g/dL (ref 3.5–5.0)
Alkaline Phosphatase: 30 U/L — ABNORMAL LOW (ref 38–126)
Anion gap: 7 (ref 5–15)
BUN: 12 mg/dL (ref 6–20)
CO2: 26 mmol/L (ref 22–32)
Calcium: 9 mg/dL (ref 8.9–10.3)
Chloride: 103 mmol/L (ref 98–111)
Creatinine: 0.8 mg/dL (ref 0.44–1.00)
GFR, Estimated: >60 mL/min (ref >60)
Glucose, Bld: 104 mg/dL — ABNORMAL HIGH (ref 70–99)
Potassium: 4 mmol/L (ref 3.5–5.1)
Sodium: 136 mmol/L (ref 135–145)
Total Bilirubin: 0.5 mg/dL (ref 0.3–1.2)
Total Protein: 5.7 g/dL — ABNORMAL LOW (ref 6.5–8.1)

## 2021-08-23 LAB — CBC WITH DIFFERENTIAL (CANCER CENTER ONLY)
Abs Immature Granulocytes: 0.02 10*3/uL (ref 0.00–0.07)
Basophils Absolute: 0.1 10*3/uL (ref 0.0–0.1)
Basophils Relative: 2 %
Eosinophils Absolute: 0.1 10*3/uL (ref 0.0–0.5)
Eosinophils Relative: 5 %
HCT: 41.3 % (ref 36.0–46.0)
Hemoglobin: 13.9 g/dL (ref 12.0–15.0)
Immature Granulocytes: 1 %
Lymphocytes Relative: 27 %
Lymphs Abs: 0.6 10*3/uL — ABNORMAL LOW (ref 0.7–4.0)
MCH: 30.9 pg (ref 26.0–34.0)
MCHC: 33.7 g/dL (ref 30.0–36.0)
MCV: 91.8 fL (ref 80.0–100.0)
Monocytes Absolute: 0.2 10*3/uL (ref 0.1–1.0)
Monocytes Relative: 10 %
Neutro Abs: 1.3 10*3/uL — ABNORMAL LOW (ref 1.7–7.7)
Neutrophils Relative %: 55 %
Platelet Count: 210 10*3/uL (ref 150–400)
RBC: 4.5 MIL/uL (ref 3.87–5.11)
RDW: 13.7 % (ref 11.5–15.5)
WBC Count: 2.4 10*3/uL — ABNORMAL LOW (ref 4.0–10.5)
nRBC: 0 % (ref 0.0–0.2)

## 2021-08-23 MED ORDER — ZOLEDRONIC ACID 4 MG/100ML IV SOLN
4.0000 mg | Freq: Once | INTRAVENOUS | Status: AC
  Administered 2021-08-23: 4 mg via INTRAVENOUS
  Filled 2021-08-23: qty 100

## 2021-08-23 MED ORDER — OXYCODONE HCL 5 MG PO TABS: 5.0000 mg | ORAL_TABLET | ORAL | 0 refills | Status: DC | PRN

## 2021-08-23 MED ORDER — SODIUM CHLORIDE 0.9 % IV SOLN: Freq: Once | INTRAVENOUS | Status: AC

## 2021-08-23 NOTE — Progress Notes (Signed)
[facility] OFFICE PROGRESS NOTE   Diagnosis: Multiple myeloma  INTERVAL HISTORY:   Katherine Beard returns as scheduled.  She begin another cycle of Revlimid on 08/16/2021.  She continues to have back pain when she is sitting for a prolonged period.  She takes oxycodone approximately twice daily.  She is followed by Dr. Saintclair Halsted for the back pain and compression fractures.  She had an MRI last week and will see Dr. Saintclair Halsted this week.  She is being scheduled for a "facet "injection.  She continues to have pain in the right upper abdomen. She is exercising.  She just returned from vacation last night.  Objective:  Vital signs in last 24 hours:  Blood pressure 131/79, pulse 79, temperature 97.8 F (36.6 C), temperature source Oral, resp. rate 19, height 5' 2"  (1.575 m), weight 173 lb 6.4 oz (78.7 kg), last menstrual period 03/06/2011, SpO2 100 %.    HEENT: No thrush Resp: Lungs clear bilaterally Cardio: Regular rate and rhythm GI: No hepatosplenomegaly, no mass Vascular: No leg edema   Lab Results:  Lab Results  Component Value Date   WBC 2.4 (L) 08/23/2021   HGB 13.9 08/23/2021   HCT 41.3 08/23/2021   MCV 91.8 08/23/2021   PLT 210 08/23/2021   NEUTROABS 1.3 (L) 08/23/2021    CMP  Lab Results  Component Value Date   NA 136 08/23/2021   K 4.0 08/23/2021   CL 103 08/23/2021   CO2 26 08/23/2021   GLUCOSE 104 (H) 08/23/2021   BUN 12 08/23/2021   CREATININE 0.80 08/23/2021   CALCIUM 9.0 08/23/2021   PROT 5.7 (L) 08/23/2021   ALBUMIN 4.2 08/23/2021   AST 14 (L) 08/23/2021   ALT 16 08/23/2021   ALKPHOS 30 (L) 08/23/2021   BILITOT 0.5 08/23/2021   GFRNONAA >60 08/23/2021   GFRAA 78 12/11/2006     Medications: I have reviewed the patient's current medications.   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 Increase serum free lambda light chains Serum M spike MRI pelvis 09/01/2020-innumerable small foci of signal abnormality concerning for multiple myeloma involvement Metastatic bone survey 09/03/2020-no discrete lytic bone lesions, compression fractures at T12, L1, L2, and L3, osteopenia Cycle 1 RVD 09/14/2020 Daratumumab beginning 09/28/2020 Cycle 2 RVD 10/05/2020 plus daratumumab Week 3 daratumumab 10/12/2020 Normal lambda light chains 10/26/2020 Cycle 3 RVD plus daratumumab 10/26/2020 Cycle 4 RVD plus daratumumab 11/16/2020, daratumumab changed to an every 2-week schedule beginning 11/23/2020 Cycle 5 RVD plus daratumumab 12/07/2020 Cycle 6 RVD plus daratumumab 12/28/2020 Normal lambda light chains 01/05/2021 Revlimid maintenance 21/28 days beginning 01/18/2021 Stem cell harvest at [facility] 03/29/2021 Revlimid resumed 03/30/2021   2. Severe back pain MRI lumbar spine July 12, 2020-subacute compression fractures at T12 and L1 MRI lumbar spine July 30, 2020-subacute T12 and L1 compression fractures, new subacute superior L2 endplate deformity MRI lumbar spine September 01, 2020 new/progressive compression fractures at L2 and L3, acute compression fracture at L4, evolution of T12 and L1 compression fractures MRI pelvis September 01, 2020-innumerable small foci of signal abnormality in the pelvis and femurs, no fracture identified, concerning for multiple myeloma versus metastases Zometa beginning 09/21/2020-plan monthly x3 then every 3 months, next due August 2022 MRI thoracic spine July 2022-new T3 compression fracture with multiple additional thoracic compression fractures 3. Melanoma left upper back 2014 4. History of diverticulitis 5. Mild  hypercalcemia at presentation-resolved 6. Osteoporosis 7. Evusheld 8.  Depression-worsened depression symptoms 01/11/2021, referred to psychology   Disposition: Katherine Beard appears stable.  She will continue maintenance Revlimid.  She has mild neutropenia.  She  will call for a fever.  She will receive Zometa today.  Katherine Beard will be scheduled for a CBC prior to the next cycle of Revlimid.  She will return for an office visit 10/11/2021.  She will continue follow-up with Dr. Saintclair Halsted for management of back pain and compression fractures.  [name], MD  08/23/2021  9:14 AM

## 2021-08-23 NOTE — Patient Instructions (Signed)

## 2021-08-23 NOTE — Progress Notes (Signed)
Patient seen by Dr. Benay Spice today  Vitals are within treatment parameters.  Labs reviewed by Dr. Benay Spice and are within treatment parameters.  Per physician team, patient is approved to receive Zometa today.

## 2021-08-24 ENCOUNTER — Telehealth: Payer: Self-pay

## 2021-08-24 ENCOUNTER — Encounter: Payer: Self-pay | Admitting: Oncology

## 2021-08-24 LAB — KAPPA/LAMBDA LIGHT CHAINS
Kappa free light chain: 9.7 mg/L (ref 3.3–19.4)
Kappa, lambda light chain ratio: 1.23 (ref 0.26–1.65)
Lambda free light chains: 7.9 mg/L (ref 5.7–26.3)

## 2021-08-24 NOTE — Telephone Encounter (Signed)
Information left on the patient's voicemail.

## 2021-08-24 NOTE — Telephone Encounter (Signed)
-----   Message from Mauri Pole, MD sent at 08/19/2021  2:55 PM EST ----- Reviewed MRI thoracolumbar spine report, she has new compression fracture at T3 corresponding to the location of the referred pain in the back on the right side.  Will need to exclude nerve impingement or radiculopathy.  Follow-up with neuro spine specialist  Can you please inform patient, I think she would have already gotten the results but she requested me to review as well  Thanks

## 2021-09-01 ENCOUNTER — Other Ambulatory Visit: Payer: Self-pay | Admitting: *Deleted

## 2021-09-01 ENCOUNTER — Encounter: Payer: Self-pay | Admitting: Oncology

## 2021-09-01 DIAGNOSIS — C9000 Multiple myeloma not having achieved remission: Secondary | ICD-10-CM

## 2021-09-01 MED ORDER — METHOCARBAMOL 500 MG PO TABS: ORAL_TABLET | ORAL | 1 refills | Status: DC

## 2021-09-01 NOTE — Telephone Encounter (Signed)
Mychart message from patient requesting refill on methocarbamol. Refill sent. ?

## 2021-09-01 NOTE — Telephone Encounter (Signed)
Place the request on the provider desk ?

## 2021-09-02 ENCOUNTER — Other Ambulatory Visit: Payer: Self-pay | Admitting: Oncology

## 2021-09-04 ENCOUNTER — Other Ambulatory Visit: Payer: Self-pay | Admitting: Oncology

## 2021-09-06 ENCOUNTER — Other Ambulatory Visit: Payer: Self-pay | Admitting: Oncology

## 2021-09-06 ENCOUNTER — Encounter: Payer: Self-pay | Admitting: Oncology

## 2021-09-13 ENCOUNTER — Other Ambulatory Visit: Payer: Self-pay

## 2021-09-13 ENCOUNTER — Inpatient Hospital Stay: Payer: 59 | Attending: Oncology

## 2021-09-13 DIAGNOSIS — C9000 Multiple myeloma not having achieved remission: Secondary | ICD-10-CM | POA: Insufficient documentation

## 2021-09-13 LAB — CBC WITH DIFFERENTIAL (CANCER CENTER ONLY)
Abs Immature Granulocytes: 0.01 10*3/uL (ref 0.00–0.07)
Basophils Absolute: 0.1 10*3/uL (ref 0.0–0.1)
Basophils Relative: 2 %
Eosinophils Absolute: 0.1 10*3/uL (ref 0.0–0.5)
Eosinophils Relative: 4 %
HCT: 41.7 % (ref 36.0–46.0)
Hemoglobin: 14.2 g/dL (ref 12.0–15.0)
Immature Granulocytes: 0 %
Lymphocytes Relative: 35 %
Lymphs Abs: 1 10*3/uL (ref 0.7–4.0)
MCH: 30.7 pg (ref 26.0–34.0)
MCHC: 34.1 g/dL (ref 30.0–36.0)
MCV: 90.3 fL (ref 80.0–100.0)
Monocytes Absolute: 0.3 10*3/uL (ref 0.1–1.0)
Monocytes Relative: 12 %
Neutro Abs: 1.3 10*3/uL — ABNORMAL LOW (ref 1.7–7.7)
Neutrophils Relative %: 47 %
Platelet Count: 234 10*3/uL (ref 150–400)
RBC: 4.62 MIL/uL (ref 3.87–5.11)
RDW: 13.5 % (ref 11.5–15.5)
WBC Count: 2.9 10*3/uL — ABNORMAL LOW (ref 4.0–10.5)
nRBC: 0 % (ref 0.0–0.2)

## 2021-09-14 ENCOUNTER — Encounter: Payer: Self-pay | Admitting: Oncology

## 2021-09-14 LAB — KAPPA/LAMBDA LIGHT CHAINS
Kappa free light chain: 10 mg/L (ref 3.3–19.4)
Kappa, lambda light chain ratio: 1.43 (ref 0.26–1.65)
Lambda free light chains: 7 mg/L (ref 5.7–26.3)

## 2021-09-15 ENCOUNTER — Telehealth: Payer: Self-pay

## 2021-09-15 NOTE — Telephone Encounter (Signed)
V/M message left. Pt informed to call if she has any other questions or concerns. ?

## 2021-09-15 NOTE — Telephone Encounter (Signed)
-----   Message from Ladell Pier, MD sent at 09/13/2021  5:37 PM EDT ----- ?Please call patient, neutrophil count is mildly low, okay to continue lenalidomide as scheduled, call for fever or symptoms of infection, follow-up as scheduled ? ?

## 2021-09-21 ENCOUNTER — Other Ambulatory Visit: Payer: Self-pay | Admitting: Oncology

## 2021-09-21 ENCOUNTER — Other Ambulatory Visit: Payer: Self-pay | Admitting: Nurse Practitioner

## 2021-09-21 DIAGNOSIS — C9000 Multiple myeloma not having achieved remission: Secondary | ICD-10-CM

## 2021-09-21 MED ORDER — OXYCODONE HCL 5 MG PO TABS: 5.0000 mg | ORAL_TABLET | ORAL | 0 refills | Status: DC | PRN

## 2021-10-03 ENCOUNTER — Other Ambulatory Visit: Payer: Self-pay | Admitting: Oncology

## 2021-10-04 ENCOUNTER — Other Ambulatory Visit: Payer: Self-pay | Admitting: *Deleted

## 2021-10-04 ENCOUNTER — Encounter: Payer: Self-pay | Admitting: Oncology

## 2021-10-06 ENCOUNTER — Encounter: Payer: Self-pay | Admitting: Oncology

## 2021-10-06 ENCOUNTER — Telehealth: Payer: Self-pay | Admitting: Pharmacy Technician

## 2021-10-06 ENCOUNTER — Other Ambulatory Visit: Payer: Self-pay | Admitting: Oncology

## 2021-10-06 NOTE — Telephone Encounter (Signed)
Oral Oncology Patient Advocate Encounter ?  ?Received notification from [employer] that the existing prior authorization for Lenalidomide (generic for Revlimid) is due for renewal. ?  ?Renewal PA submitted on CoverMyMeds ?Key [id number] ?Status is pending ?  ?Oral Oncology Clinic will continue to follow. ? ?Dennison Nancy CPHT ?Specialty Pharmacy Patient Advocate ?[place] ?Phone [phone] ?Fax [phone] ?10/06/2021 2:49 PM ? ?

## 2021-10-07 ENCOUNTER — Encounter: Payer: Self-pay | Admitting: *Deleted

## 2021-10-07 NOTE — Telephone Encounter (Signed)
Oral Oncology Patient Advocate Encounter ? ?Prior Authorization for Lenalidomide has been approved.   ? ?PA# none ?Effective dates: 10/06/21 through 10/07/22 ? ?[facility] is preferred pharmacy. ? ?Oral Oncology Clinic will continue to follow.  ? ?Dennison Nancy CPHT ?Specialty Pharmacy Patient Advocate ?[place] ?Phone [phone] ?Fax [phone] ?10/07/2021 10:05 AM ? ?

## 2021-10-11 ENCOUNTER — Inpatient Hospital Stay: Payer: 59 | Admitting: Oncology

## 2021-10-11 ENCOUNTER — Inpatient Hospital Stay: Payer: 59

## 2021-10-12 ENCOUNTER — Inpatient Hospital Stay: Payer: 59 | Admitting: Oncology

## 2021-10-12 ENCOUNTER — Inpatient Hospital Stay: Payer: 59 | Attending: Oncology

## 2021-10-12 ENCOUNTER — Encounter: Payer: Self-pay | Admitting: Oncology

## 2021-10-12 VITALS — BP 137/78 | HR 60 | Temp 98.2°F | Resp 18 | Ht 62.0 in | Wt 176.2 lb

## 2021-10-12 DIAGNOSIS — F32A Depression, unspecified: Secondary | ICD-10-CM | POA: Insufficient documentation

## 2021-10-12 DIAGNOSIS — Z85820 Personal history of malignant melanoma of skin: Secondary | ICD-10-CM | POA: Diagnosis not present

## 2021-10-12 DIAGNOSIS — M810 Age-related osteoporosis without current pathological fracture: Secondary | ICD-10-CM | POA: Insufficient documentation

## 2021-10-12 DIAGNOSIS — C9000 Multiple myeloma not having achieved remission: Secondary | ICD-10-CM | POA: Insufficient documentation

## 2021-10-12 LAB — CBC WITH DIFFERENTIAL (CANCER CENTER ONLY)
Abs Immature Granulocytes: 0 10*3/uL (ref 0.00–0.07)
Basophils Absolute: 0.1 10*3/uL (ref 0.0–0.1)
Basophils Relative: 2 %
Eosinophils Absolute: 0.1 10*3/uL (ref 0.0–0.5)
Eosinophils Relative: 3 %
HCT: 42.2 % (ref 36.0–46.0)
Hemoglobin: 14.2 g/dL (ref 12.0–15.0)
Immature Granulocytes: 0 %
Lymphocytes Relative: 30 %
Lymphs Abs: 0.9 10*3/uL (ref 0.7–4.0)
MCH: 30.7 pg (ref 26.0–34.0)
MCHC: 33.6 g/dL (ref 30.0–36.0)
MCV: 91.3 fL (ref 80.0–100.0)
Monocytes Absolute: 0.4 10*3/uL (ref 0.1–1.0)
Monocytes Relative: 14 %
Neutro Abs: 1.6 10*3/uL — ABNORMAL LOW (ref 1.7–7.7)
Neutrophils Relative %: 51 %
Platelet Count: 251 10*3/uL (ref 150–400)
RBC: 4.62 MIL/uL (ref 3.87–5.11)
RDW: 13.5 % (ref 11.5–15.5)
WBC Count: 3.1 10*3/uL — ABNORMAL LOW (ref 4.0–10.5)
nRBC: 0 % (ref 0.0–0.2)

## 2021-10-12 LAB — CMP (CANCER CENTER ONLY)
ALT: 17 U/L (ref 0–44)
AST: 18 U/L (ref 15–41)
Albumin: 4.2 g/dL (ref 3.5–5.0)
Alkaline Phosphatase: 29 U/L — ABNORMAL LOW (ref 38–126)
Anion gap: 6 (ref 5–15)
BUN: 15 mg/dL (ref 6–20)
CO2: 29 mmol/L (ref 22–32)
Calcium: 9.2 mg/dL (ref 8.9–10.3)
Chloride: 103 mmol/L (ref 98–111)
Creatinine: 0.83 mg/dL (ref 0.44–1.00)
GFR, Estimated: >60 mL/min (ref >60)
Glucose, Bld: 101 mg/dL — ABNORMAL HIGH (ref 70–99)
Potassium: 4.2 mmol/L (ref 3.5–5.1)
Sodium: 138 mmol/L (ref 135–145)
Total Bilirubin: 0.5 mg/dL (ref 0.3–1.2)
Total Protein: 6.2 g/dL — ABNORMAL LOW (ref 6.5–8.1)

## 2021-10-12 MED ORDER — OXYCODONE HCL 5 MG PO TABS: 5.0000 mg | ORAL_TABLET | ORAL | 0 refills | Status: DC | PRN

## 2021-10-12 NOTE — Progress Notes (Signed)
?[facility] ?OFFICE PROGRESS NOTE ? ? ?Diagnosis: Multiple myeloma ? ?INTERVAL HISTORY:  ? ?Katherine Beard returns as scheduled.  She began another cycle of Revlimid yesterday.  She reports having diarrhea for 1 to 2 days at the beginning of each Revlimid cycle.  No other diarrhea.  No nausea.  She has stable back and "hip "pain.  She takes methocarbamol and oxycodone twice daily.  She is working.  She exercising in a pool. ? ?Objective: ? ?Vital signs in last 24 hours: ? ?Blood pressure 137/78, pulse 60, temperature 98.2 ?F (36.8 ?C), temperature source Oral, resp. rate 18, height 5' 2"  (1.575 m), weight 176 lb 3.2 oz (79.9 kg), last menstrual period 03/06/2011, SpO2 100 %. ?  ? ?HEENT: No thrush ?Resp: Lungs clear bilaterally ?Cardio: Regular rate and rhythm ?GI: No hepatosplenomegaly ?Vascular: No leg edema ?  ? ?Lab Results: ? ?Lab Results  ?Component Value Date  ? WBC 3.1 (L) 10/12/2021  ? HGB 14.2 10/12/2021  ? HCT 42.2 10/12/2021  ? MCV 91.3 10/12/2021  ? PLT 251 10/12/2021  ? NEUTROABS 1.6 (L) 10/12/2021  ? ? ?CMP  ?Lab Results  ?Component Value Date  ? NA 136 08/23/2021  ? K 4.0 08/23/2021  ? CL 103 08/23/2021  ? CO2 26 08/23/2021  ? GLUCOSE 104 (H) 08/23/2021  ? BUN 12 08/23/2021  ? CREATININE 0.80 08/23/2021  ? CALCIUM 9.0 08/23/2021  ? PROT 5.7 (L) 08/23/2021  ? ALBUMIN 4.2 08/23/2021  ? AST 14 (L) 08/23/2021  ? ALT 16 08/23/2021  ? ALKPHOS 30 (L) 08/23/2021  ? BILITOT 0.5 08/23/2021  ? GFRNONAA >60 08/23/2021  ? GFRAA 78 12/11/2006  ? ? ? ? ?Medications: I have reviewed the patient's current medications. ? ? ?Assessment/Plan: ?Multiple myeloma ?Bone marrow biopsy 09/04/2020-increased plasma cells (57% on the aspirate differential, 70-80% on the biopsy), lambda light chain restricted, 86P-, duplication of 1q, 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 ?Increase serum free lambda light chains ?Serum M spike ?MRI pelvis 09/01/2020-innumerable small foci of signal abnormality concerning for multiple myeloma  involvement ?Metastatic bone survey 09/03/2020-no discrete lytic bone lesions, compression fractures at T12, L1, L2, and L3, osteopenia ?Cycle 1 RVD 09/14/2020 ?Daratumumab beginning 09/28/2020 ?Cycle 2 RVD 10/05/2020 plus daratumumab ?Week 3 daratumumab 10/12/2020 ?Normal lambda light chains 10/26/2020 ?Cycle 3 RVD plus daratumumab 10/26/2020 ?Cycle 4 RVD plus daratumumab 11/16/2020, daratumumab changed to an every 2-week schedule beginning 11/23/2020 ?Cycle 5 RVD plus daratumumab 12/07/2020 ?Cycle 6 RVD plus daratumumab 12/28/2020 ?Normal lambda light chains 01/05/2021 ?Revlimid maintenance 21/28 days beginning 01/18/2021 ?Stem cell harvest at [facility] 03/29/2021 ?Revlimid resumed 03/30/2021 ?  ?2. Severe back pain ?MRI lumbar spine July 12, 2020-subacute compression fractures at T12 and L1 ?MRI lumbar spine July 30, 2020-subacute T12 and L1 compression fractures, new subacute superior L2 endplate deformity ?MRI lumbar spine September 01, 2020 new/progressive compression fractures at L2 and L3, acute compression fracture at L4, evolution of T12 and L1 compression fractures ?MRI pelvis September 01, 2020-innumerable small foci of signal abnormality in the pelvis and femurs, no fracture identified, concerning for multiple myeloma versus metastases ?Zometa beginning 09/21/2020-plan monthly x3 then every 3 months, next due August 2022 ?MRI thoracic spine July 2022-new T3 compression fracture with multiple additional thoracic compression fractures ?3. Melanoma left upper back 2014 ?4. History of diverticulitis ?5. Mild hypercalcemia at presentation-resolved ?6. Osteoporosis ?7. Evusheld ?8.  Depression-worsened depression symptoms 01/11/2021, referred to psychology ? ? ? ? ?Disposition: ?Katherine Beard appears stable.  She will continue Revlimid maintenance.  She will return for an office visit and Zometa in May. ?She is scheduled to see [name] 5-23. ? ?She continues follow-up with neurosurgery and the pain clinic for management of chronic back  pain.  I refilled her prescription for oxycodone. ? ?[name], MD ? ?10/12/2021  ?8:45 AM ? ? ?

## 2021-10-13 ENCOUNTER — Encounter: Payer: 59 | Admitting: Internal Medicine

## 2021-10-18 ENCOUNTER — Telehealth: Payer: Self-pay

## 2021-10-18 NOTE — Telephone Encounter (Signed)
Follow call after Pt called triage access line stating she has mouth pain and wants to know if it's a side effect from Zometa. Informed Pt Zometa can cause dental issues but she needs to make an appointment with the dentist and inform them that she is taking Zometa as well. Pt verbalized understanding. ?

## 2021-10-20 ENCOUNTER — Other Ambulatory Visit: Payer: Self-pay | Admitting: Oncology

## 2021-10-20 DIAGNOSIS — C9000 Multiple myeloma not having achieved remission: Secondary | ICD-10-CM

## 2021-10-22 ENCOUNTER — Other Ambulatory Visit: Payer: Self-pay | Admitting: Nurse Practitioner

## 2021-10-22 DIAGNOSIS — C9000 Multiple myeloma not having achieved remission: Secondary | ICD-10-CM

## 2021-10-22 MED ORDER — DIAZEPAM 5 MG PO TABS: 5.0000 mg | ORAL_TABLET | Freq: Two times a day (BID) | ORAL | 0 refills | Status: DC | PRN

## 2021-10-27 ENCOUNTER — Encounter: Payer: Self-pay | Admitting: Oncology

## 2021-10-28 ENCOUNTER — Other Ambulatory Visit: Payer: Self-pay

## 2021-10-28 DIAGNOSIS — C9000 Multiple myeloma not having achieved remission: Secondary | ICD-10-CM

## 2021-10-28 MED ORDER — METHOCARBAMOL 500 MG PO TABS: ORAL_TABLET | ORAL | 1 refills | Status: DC

## 2021-10-31 ENCOUNTER — Other Ambulatory Visit: Payer: Self-pay | Admitting: Oncology

## 2021-11-01 ENCOUNTER — Encounter: Payer: Self-pay | Admitting: Oncology

## 2021-11-03 ENCOUNTER — Other Ambulatory Visit: Payer: Self-pay | Admitting: Oncology

## 2021-11-03 ENCOUNTER — Encounter: Payer: Self-pay | Admitting: Oncology

## 2021-11-03 ENCOUNTER — Other Ambulatory Visit: Payer: Self-pay

## 2021-11-05 ENCOUNTER — Other Ambulatory Visit: Payer: Self-pay | Admitting: Oncology

## 2021-11-05 ENCOUNTER — Other Ambulatory Visit: Payer: Self-pay | Admitting: Nurse Practitioner

## 2021-11-05 DIAGNOSIS — C9000 Multiple myeloma not having achieved remission: Secondary | ICD-10-CM

## 2021-11-05 MED ORDER — OXYCODONE HCL 5 MG PO TABS: 5.0000 mg | ORAL_TABLET | ORAL | 0 refills | Status: DC | PRN

## 2021-11-08 IMAGING — DX DG HIP (WITH OR WITHOUT PELVIS) 2-3V*R*
3 series · 3 of 3 positions shown · non-contrast
Comparison: Plain films right hip 07/22/2020.

CLINICAL DATA: Onset severe right hip pain 1 week ago in a patient
with a history of multiple myeloma.

EXAM:
DG HIP (WITH OR WITHOUT PELVIS) 2-3V RIGHT

[pelvis ap]
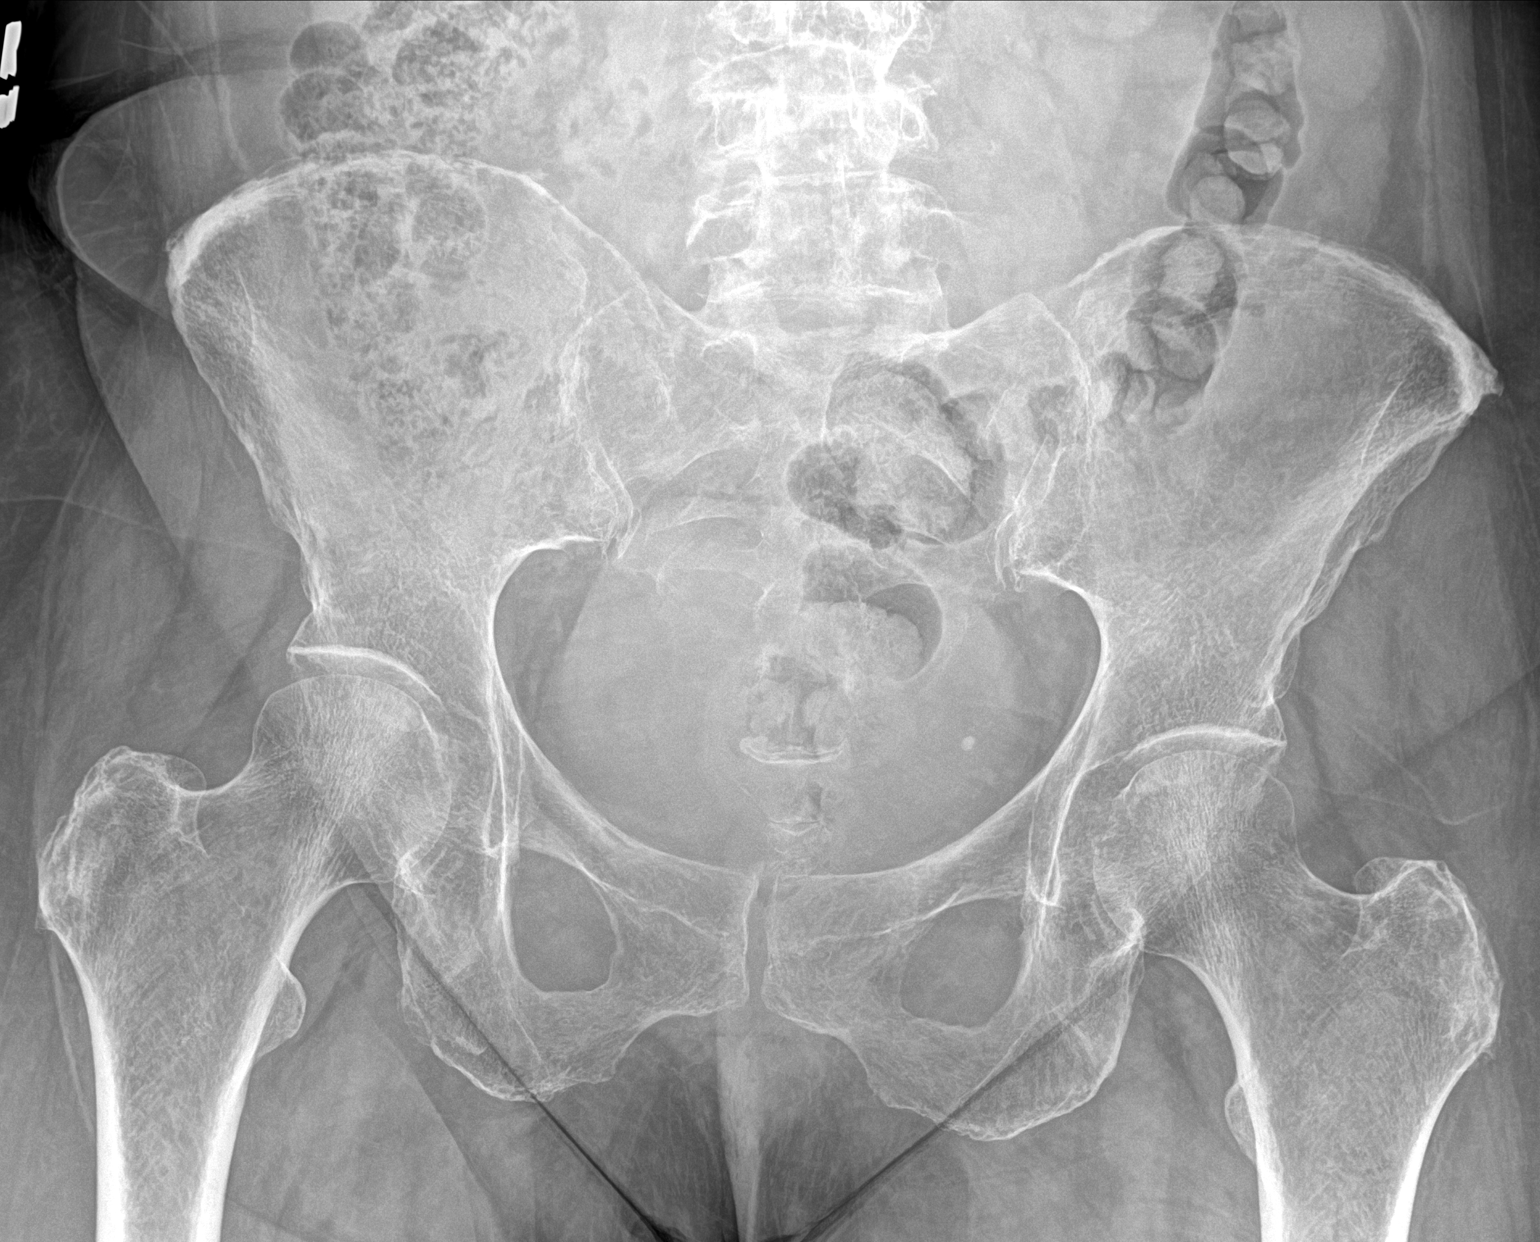

[hip ap]
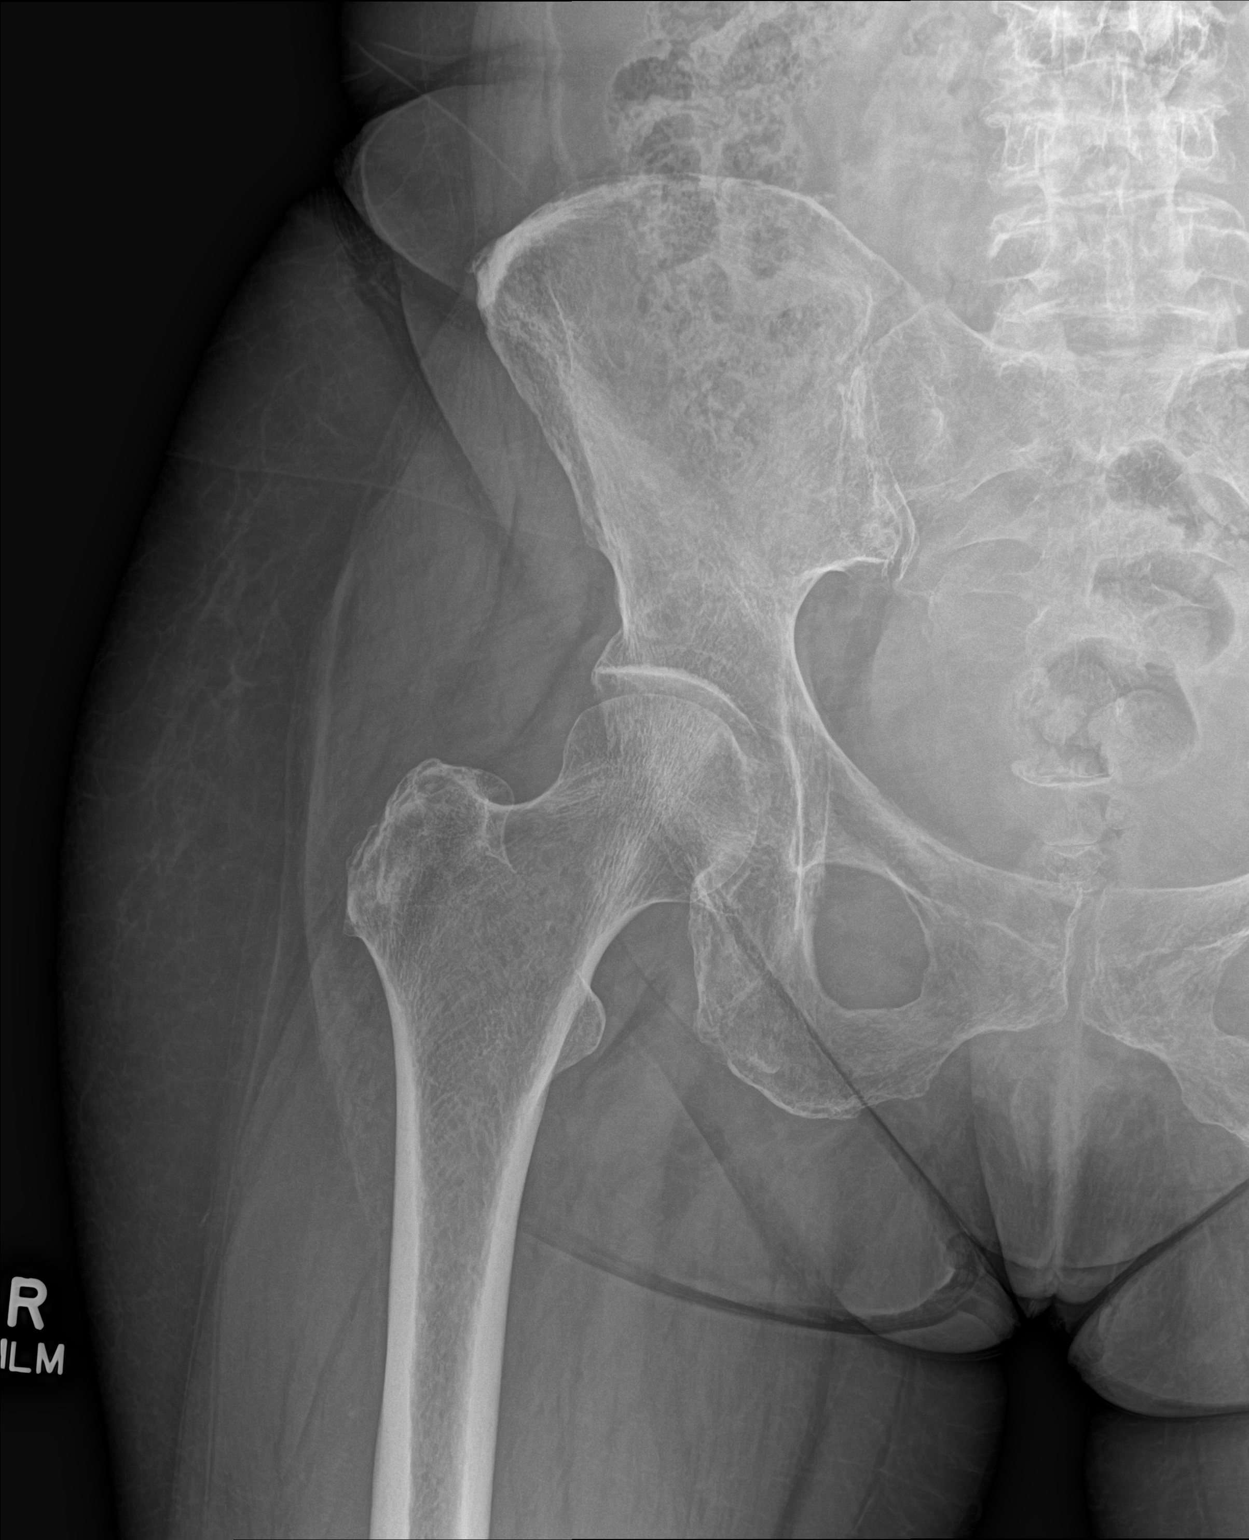

[hip lat]
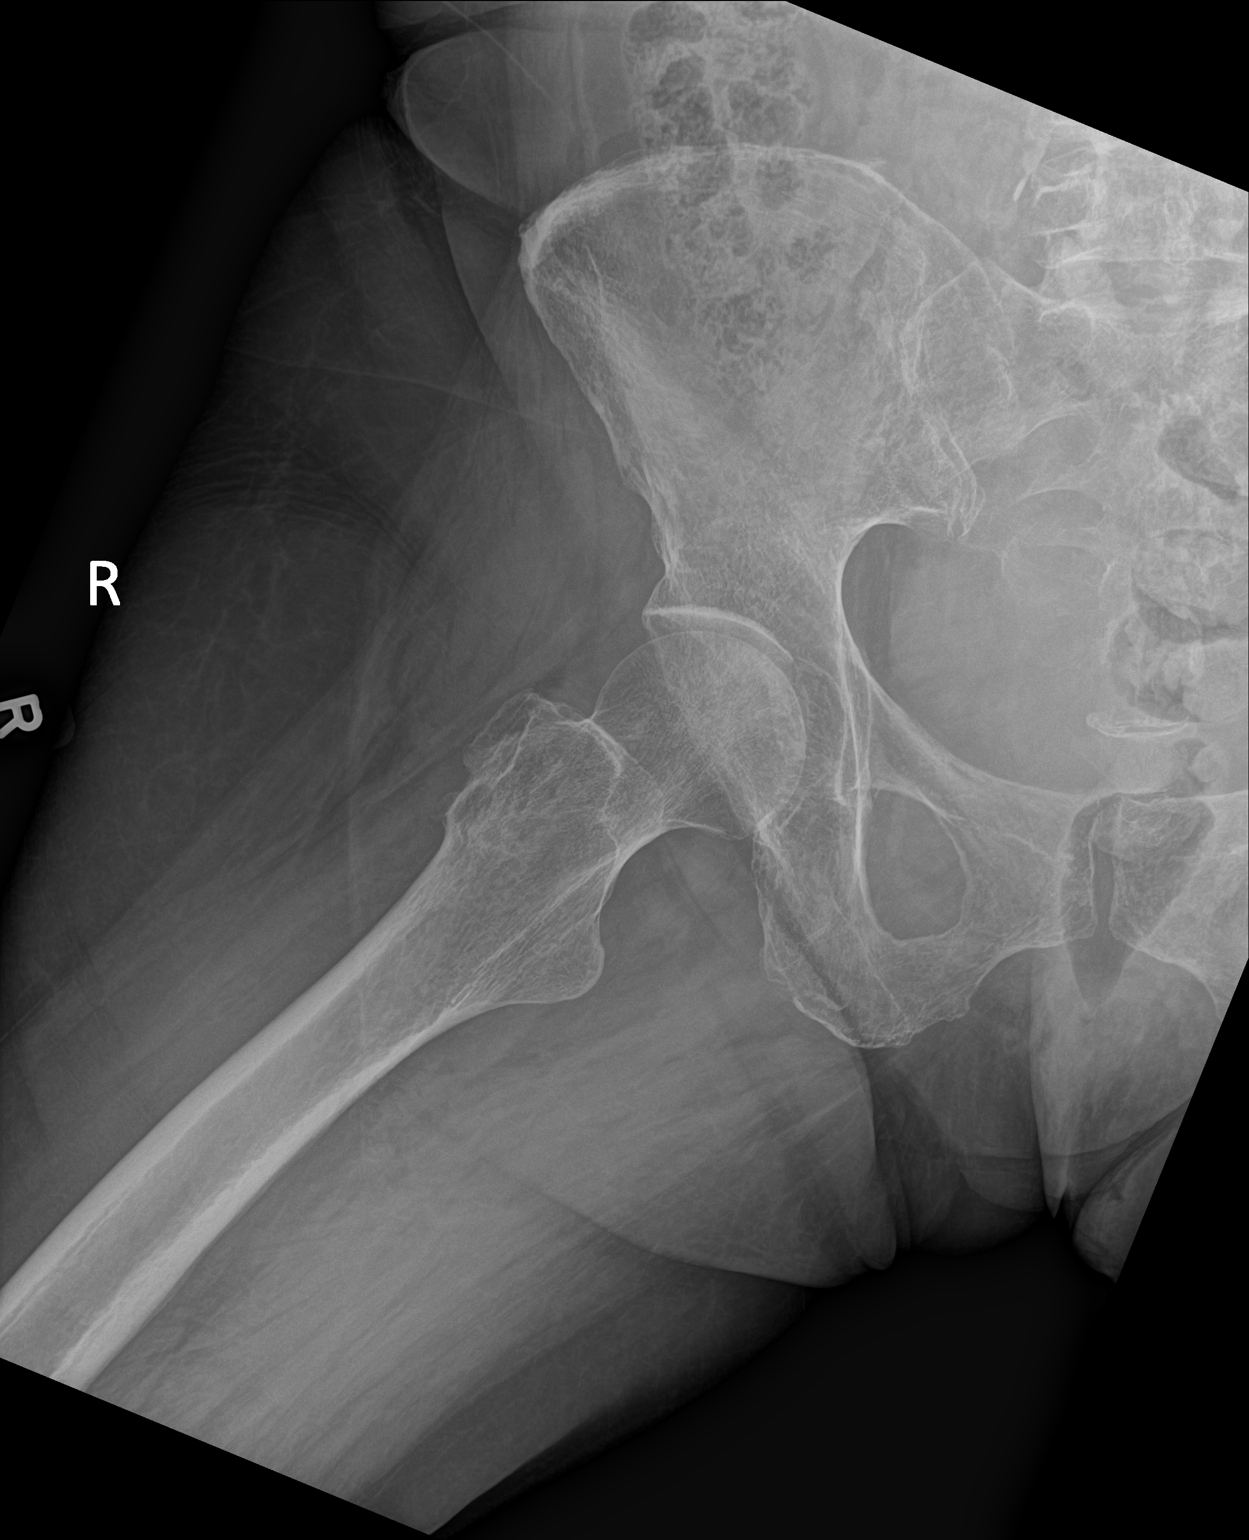

[3 of 3 positions shown; findings below may reference images not displayed]

FINDINGS: Innumerable punctate lucencies are seen in all imaged bones. No
acute bony or joint abnormality is seen. No evidence of arthropathy.
No avascular necrosis of the femoral heads.
IMPRESSION: No acute abnormality.

Punctate lucencies in all imaged bones correlate with the patient's
history of multiple myeloma.

## 2021-11-16 ENCOUNTER — Ambulatory Visit: Payer: Self-pay | Admitting: Oncology

## 2021-11-16 ENCOUNTER — Ambulatory Visit: Payer: Self-pay

## 2021-11-16 ENCOUNTER — Other Ambulatory Visit: Payer: 59

## 2021-11-19 ENCOUNTER — Inpatient Hospital Stay: Payer: 59

## 2021-11-19 ENCOUNTER — Inpatient Hospital Stay: Payer: 59 | Attending: Oncology

## 2021-11-19 ENCOUNTER — Inpatient Hospital Stay: Payer: 59 | Admitting: Oncology

## 2021-11-19 ENCOUNTER — Other Ambulatory Visit: Payer: Self-pay | Admitting: Nurse Practitioner

## 2021-11-19 ENCOUNTER — Ambulatory Visit: Payer: Self-pay | Admitting: *Deleted

## 2021-11-19 VITALS — BP 127/69 | HR 60 | Temp 98.2°F | Resp 18 | Ht 62.0 in | Wt 175.6 lb

## 2021-11-19 DIAGNOSIS — C9000 Multiple myeloma not having achieved remission: Secondary | ICD-10-CM

## 2021-11-19 DIAGNOSIS — C9001 Multiple myeloma in remission: Secondary | ICD-10-CM | POA: Diagnosis not present

## 2021-11-19 DIAGNOSIS — M810 Age-related osteoporosis without current pathological fracture: Secondary | ICD-10-CM | POA: Diagnosis not present

## 2021-11-19 LAB — CMP (CANCER CENTER ONLY)
ALT: 22 U/L (ref 0–44)
AST: 17 U/L (ref 15–41)
Albumin: 4.1 g/dL (ref 3.5–5.0)
Alkaline Phosphatase: 36 U/L — ABNORMAL LOW (ref 38–126)
Anion gap: 9 (ref 5–15)
BUN: 16 mg/dL (ref 6–20)
CO2: 26 mmol/L (ref 22–32)
Calcium: 8.9 mg/dL (ref 8.9–10.3)
Chloride: 104 mmol/L (ref 98–111)
Creatinine: 0.81 mg/dL (ref 0.44–1.00)
GFR, Estimated: >60 mL/min (ref >60)
Glucose, Bld: 87 mg/dL (ref 70–99)
Potassium: 3.9 mmol/L (ref 3.5–5.1)
Sodium: 139 mmol/L (ref 135–145)
Total Bilirubin: 0.4 mg/dL (ref 0.3–1.2)
Total Protein: 6.2 g/dL — ABNORMAL LOW (ref 6.5–8.1)

## 2021-11-19 LAB — CBC WITH DIFFERENTIAL (CANCER CENTER ONLY)
Abs Immature Granulocytes: 0.01 10*3/uL (ref 0.00–0.07)
Basophils Absolute: 0 10*3/uL (ref 0.0–0.1)
Basophils Relative: 1 %
Eosinophils Absolute: 0.1 10*3/uL (ref 0.0–0.5)
Eosinophils Relative: 2 %
HCT: 41.3 % (ref 36.0–46.0)
Hemoglobin: 13.8 g/dL (ref 12.0–15.0)
Immature Granulocytes: 0 %
Lymphocytes Relative: 18 %
Lymphs Abs: 0.7 10*3/uL (ref 0.7–4.0)
MCH: 30.8 pg (ref 26.0–34.0)
MCHC: 33.4 g/dL (ref 30.0–36.0)
MCV: 92.2 fL (ref 80.0–100.0)
Monocytes Absolute: 0.3 10*3/uL (ref 0.1–1.0)
Monocytes Relative: 8 %
Neutro Abs: 2.6 10*3/uL (ref 1.7–7.7)
Neutrophils Relative %: 71 %
Platelet Count: 225 10*3/uL (ref 150–400)
RBC: 4.48 MIL/uL (ref 3.87–5.11)
RDW: 13.6 % (ref 11.5–15.5)
WBC Count: 3.7 10*3/uL — ABNORMAL LOW (ref 4.0–10.5)
nRBC: 0 % (ref 0.0–0.2)

## 2021-11-19 MED ORDER — SODIUM CHLORIDE 0.9 % IV SOLN: Freq: Once | INTRAVENOUS | Status: AC

## 2021-11-19 MED ORDER — ZOLEDRONIC ACID 4 MG/100ML IV SOLN
4.0000 mg | Freq: Once | INTRAVENOUS | Status: AC
  Administered 2021-11-19: 4 mg via INTRAVENOUS
  Filled 2021-11-19: qty 100

## 2021-11-19 NOTE — Patient Instructions (Signed)

## 2021-11-19 NOTE — Progress Notes (Deleted)
  [facility] OFFICE PROGRESS NOTE   Diagnosis:   INTERVAL HISTORY:   ***  Objective:  Vital signs in last 24 hours:  Blood pressure 127/69, pulse 60, temperature 98.2 F (36.8 C), temperature source Oral, resp. rate 18, height $RemoveBe'5\' 2"'[id number]$  (1.575 m), weight 175 lb 9.6 oz (79.7 kg), last menstrual period 03/06/2011, SpO2 99 %.    HEENT: *** Lymphatics: *** Resp: *** Cardio: *** GI: *** Vascular: *** Neuro:***  Skin:***   Portacath/PICC-without erythema  Lab Results:  Lab Results  Component Value Date   WBC 3.7 (L) 11/19/2021   HGB 13.8 11/19/2021   HCT 41.3 11/19/2021   MCV 92.2 11/19/2021   PLT 225 11/19/2021   NEUTROABS 2.6 11/19/2021    CMP  Lab Results  Component Value Date   NA 139 11/19/2021   K 3.9 11/19/2021   CL 104 11/19/2021   CO2 26 11/19/2021   GLUCOSE 87 11/19/2021   BUN 16 11/19/2021   CREATININE 0.81 11/19/2021   CALCIUM 8.9 11/19/2021   PROT 6.2 (L) 11/19/2021   ALBUMIN 4.1 11/19/2021   AST 17 11/19/2021   ALT 22 11/19/2021   ALKPHOS 36 (L) 11/19/2021   BILITOT 0.4 11/19/2021   GFRNONAA >60 11/19/2021   GFRAA 78 12/11/2006    No results found for: CEA1, CEA, CAN199, CA125  Lab Results  Component Value Date   INR 1.0 09/04/2020   LABPROT 13.1 09/04/2020    Imaging:  No results found.  Medications: I have reviewed the patient's current medications.   Assessment/Plan: Multiple myeloma Bone marrow biopsy 09/04/2020-increased plasma cells (57% on the aspirate differential, 70-80% on the biopsy), lambda light chain restricted, 70H-, duplication of 1q, 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 Increase serum free lambda light chains Serum M spike MRI pelvis 09/01/2020-innumerable small foci of signal abnormality concerning for multiple myeloma involvement Metastatic bone survey 09/03/2020-no discrete lytic bone lesions, compression fractures at T12, L1, L2, and L3, osteopenia Cycle 1 RVD 09/14/2020 Daratumumab beginning 09/28/2020 Cycle 2 RVD  10/05/2020 plus daratumumab Week 3 daratumumab 10/12/2020 Normal lambda light chains 10/26/2020 Cycle 3 RVD plus daratumumab 10/26/2020 Cycle 4 RVD plus daratumumab 11/16/2020, daratumumab changed to an every 2-week schedule beginning 11/23/2020 Cycle 5 RVD plus daratumumab 12/07/2020 Cycle 6 RVD plus daratumumab 12/28/2020 Normal lambda light chains 01/05/2021 Revlimid maintenance 21/28 days beginning 01/18/2021 Stem cell harvest at [facility] 03/29/2021 Revlimid resumed 03/30/2021   2. Severe back pain MRI lumbar spine July 12, 2020-subacute compression fractures at T12 and L1 MRI lumbar spine July 30, 2020-subacute T12 and L1 compression fractures, new subacute superior L2 endplate deformity MRI lumbar spine September 01, 2020 new/progressive compression fractures at L2 and L3, acute compression fracture at L4, evolution of T12 and L1 compression fractures MRI pelvis September 01, 2020-innumerable small foci of signal abnormality in the pelvis and femurs, no fracture identified, concerning for multiple myeloma versus metastases Zometa beginning 09/21/2020-plan monthly x3 then every 3 months, next due August 2022 MRI thoracic spine July 2022-new T3 compression fracture with multiple additional thoracic compression fractures 3. Melanoma left upper back 2014 4. History of diverticulitis 5. Mild hypercalcemia at presentation-resolved 6. Osteoporosis 7. Evusheld 8.  Depression-worsened depression symptoms 01/11/2021, referred to psychology      Disposition: ***  [name], MD  11/19/2021  10:33 AM

## 2021-11-19 NOTE — Progress Notes (Signed)
Katherine Beard   Diagnosis: Multiple myeloma  INTERVAL HISTORY:   Katherine Beard returns as scheduled.  She continues to have back and hip pain.  She takes oxycodone once or twice daily on most days.  She takes increased amounts of oxycodone when she is traveling with work. She is scheduled to be seen by pain physician in approximately 2 weeks.  She is also scheduling an appointment with orthopedics. Katherine Beard saw [name] 11/02/2021.  He recommends continuing lenalidomide maintenance and Zometa.  Aspirin has been discontinued due to epigastric discomfort.  Objective:  Vital signs in last 24 hours:  Blood pressure 127/69, pulse 60, temperature 98.2 F (36.8 C), temperature source Oral, resp. rate 18, height _0  (1.575 m), weight 175 lb 9.6 oz (79.7 kg), last menstrual period 03/06/2011, SpO2 99 %.    HEENT: No thrush or ulcers Resp: Lungs clear bilaterally Cardio: Regular rate and rhythm GI: Nontender, no hepatosplenomegaly Vascular: No leg edema   Lab Results:  Lab Results  Component Value Date   WBC 3.7 (L) 11/19/2021   HGB 13.8 11/19/2021   HCT 41.3 11/19/2021   MCV 92.2 11/19/2021   PLT 225 11/19/2021   NEUTROABS 2.6 11/19/2021    CMP  Lab Results  Component Value Date   NA 139 11/19/2021   K 3.9 11/19/2021   CL 104 11/19/2021   CO2 26 11/19/2021   GLUCOSE 87 11/19/2021   BUN 16 11/19/2021   CREATININE 0.81 11/19/2021   CALCIUM 8.9 11/19/2021   PROT 6.2 (L) 11/19/2021   ALBUMIN 4.1 11/19/2021   AST 17 11/19/2021   ALT 22 11/19/2021   ALKPHOS 36 (L) 11/19/2021   BILITOT 0.4 11/19/2021   GFRNONAA >60 11/19/2021   GFRAA 78 12/11/2006     Medications: I have reviewed the patient's current medications.   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 Increase serum free lambda light chains Serum M spike MRI pelvis 09/01/2020-innumerable small foci of signal abnormality concerning for multiple myeloma involvement Metastatic bone survey 09/03/2020-no discrete lytic bone lesions, compression fractures at T12, L1, L2, and L3, osteopenia Cycle 1 RVD 09/14/2020 Daratumumab beginning 09/28/2020 Cycle 2 RVD 10/05/2020 plus daratumumab Week 3 daratumumab 10/12/2020 Normal lambda light chains 10/26/2020 Cycle 3 RVD plus daratumumab 10/26/2020 Cycle 4 RVD plus daratumumab 11/16/2020, daratumumab changed to an every 2-week schedule beginning 11/23/2020 Cycle 5 RVD plus daratumumab 12/07/2020 Cycle 6 RVD plus daratumumab 12/28/2020 Normal lambda light chains 01/05/2021 Revlimid maintenance 21/28 days beginning 01/18/2021 Stem cell harvest at [facility] 03/29/2021 Revlimid resumed 03/30/2021   2. Severe back pain MRI lumbar spine July 12, 2020-subacute compression fractures at T12 and L1 MRI lumbar spine July 30, 2020-subacute T12 and L1 compression fractures, new subacute superior L2 endplate deformity MRI lumbar spine September 01, 2020 new/progressive compression fractures at L2 and L3, acute compression fracture at L4, evolution of T12 and L1 compression fractures MRI pelvis September 01, 2020-innumerable small foci of signal abnormality in the pelvis and femurs, no fracture identified, concerning for multiple myeloma versus metastases Zometa beginning 09/21/2020-plan monthly x3 then every 3 months, next due 11/19/2021 MRI thoracic spine July 2022-new T3 compression fracture with multiple additional thoracic compression fractures 3. Melanoma left upper back 2014 4. History of diverticulitis 5. Mild hypercalcemia at presentation-resolved 6. Osteoporosis 7. Evusheld 8.  Depression-worsened depression symptoms 01/11/2021, referred to  psychology       Disposition: Katherine Beard remains in clinical remission from multiple myeloma.  The myeloma panel at [facility] 11/02/2021  was negative.  She will continue Revlimid maintenance.  She will receive Zometa today.  She will return for an office visit during the week of 12/27/2021. Katherine Beard will continue oxycodone as needed for back pain.  She is scheduling follow-up with a pain management physician and orthopedics.  [name], MD  11/19/2021  10:49 AM

## 2021-11-19 NOTE — Progress Notes (Signed)
Patient seen by Dr. Benay Spice today  Vitals are within treatment parameters.  Labs reviewed by Dr. Benay Spice and are within treatment parameters.  Per physician team, patient is ready for treatment and there are NO modifications to the treatment plan. Creatinine 0.81

## 2021-11-22 LAB — KAPPA/LAMBDA LIGHT CHAINS
Kappa free light chain: 11.1 mg/L (ref 3.3–19.4)
Kappa, lambda light chain ratio: 1.44 (ref 0.26–1.65)
Lambda free light chains: 7.7 mg/L (ref 5.7–26.3)

## 2021-11-25 ENCOUNTER — Other Ambulatory Visit: Payer: Self-pay | Admitting: Nurse Practitioner

## 2021-11-25 DIAGNOSIS — C9000 Multiple myeloma not having achieved remission: Secondary | ICD-10-CM

## 2021-11-26 ENCOUNTER — Other Ambulatory Visit: Payer: Self-pay | Admitting: Nurse Practitioner

## 2021-11-26 ENCOUNTER — Other Ambulatory Visit: Payer: Self-pay | Admitting: *Deleted

## 2021-11-26 DIAGNOSIS — C9000 Multiple myeloma not having achieved remission: Secondary | ICD-10-CM

## 2021-11-26 MED ORDER — LENALIDOMIDE 10 MG PO CAPS
10.0000 mg | ORAL_CAPSULE | Freq: Every day | ORAL | 0 refills | Status: DC
Start: 2021-11-26 — End: 2021-12-28

## 2021-11-26 MED ORDER — OXYCODONE HCL 5 MG PO TABS: 5.0000 mg | ORAL_TABLET | Freq: Four times a day (QID) | ORAL | 0 refills | Status: DC | PRN

## 2021-11-26 NOTE — Telephone Encounter (Signed)
Received faxed refill request [facility] for lenalidomide

## 2021-12-02 ENCOUNTER — Other Ambulatory Visit: Payer: Self-pay | Admitting: Oncology

## 2021-12-08 ENCOUNTER — Other Ambulatory Visit: Payer: Self-pay | Admitting: Nurse Practitioner

## 2021-12-08 DIAGNOSIS — C9000 Multiple myeloma not having achieved remission: Secondary | ICD-10-CM

## 2021-12-19 ENCOUNTER — Other Ambulatory Visit: Payer: Self-pay | Admitting: Nurse Practitioner

## 2021-12-19 DIAGNOSIS — C9000 Multiple myeloma not having achieved remission: Secondary | ICD-10-CM

## 2021-12-20 ENCOUNTER — Encounter: Payer: Self-pay | Admitting: *Deleted

## 2021-12-20 ENCOUNTER — Other Ambulatory Visit: Payer: Self-pay | Admitting: Nurse Practitioner

## 2021-12-20 DIAGNOSIS — C9000 Multiple myeloma not having achieved remission: Secondary | ICD-10-CM

## 2021-12-20 MED ORDER — OXYCODONE HCL 5 MG PO TABS: 5.0000 mg | ORAL_TABLET | Freq: Four times a day (QID) | ORAL | 0 refills | Status: DC | PRN

## 2021-12-25 ENCOUNTER — Other Ambulatory Visit: Payer: Self-pay | Admitting: Oncology

## 2021-12-28 ENCOUNTER — Other Ambulatory Visit: Payer: Self-pay | Admitting: Oncology

## 2021-12-28 ENCOUNTER — Encounter: Payer: Self-pay | Admitting: Oncology

## 2021-12-28 MED ORDER — LENALIDOMIDE 10 MG PO CAPS: 10.0000 mg | ORAL_CAPSULE | Freq: Every day | ORAL | 0 refills | Status: DC

## 2021-12-29 ENCOUNTER — Encounter: Payer: Self-pay | Admitting: Nurse Practitioner

## 2021-12-29 ENCOUNTER — Inpatient Hospital Stay: Payer: 59 | Attending: Oncology

## 2021-12-29 ENCOUNTER — Inpatient Hospital Stay (HOSPITAL_BASED_OUTPATIENT_CLINIC_OR_DEPARTMENT_OTHER): Payer: 59 | Admitting: Nurse Practitioner

## 2021-12-29 ENCOUNTER — Telehealth: Payer: Self-pay

## 2021-12-29 ENCOUNTER — Other Ambulatory Visit: Payer: Self-pay | Admitting: Nurse Practitioner

## 2021-12-29 VITALS — BP 130/73 | HR 50 | Temp 98.1°F | Resp 18 | Ht 62.0 in | Wt 175.4 lb

## 2021-12-29 DIAGNOSIS — C9000 Multiple myeloma not having achieved remission: Secondary | ICD-10-CM | POA: Insufficient documentation

## 2021-12-29 DIAGNOSIS — G8929 Other chronic pain: Secondary | ICD-10-CM | POA: Insufficient documentation

## 2021-12-29 DIAGNOSIS — D709 Neutropenia, unspecified: Secondary | ICD-10-CM | POA: Insufficient documentation

## 2021-12-29 DIAGNOSIS — F32A Depression, unspecified: Secondary | ICD-10-CM | POA: Diagnosis not present

## 2021-12-29 DIAGNOSIS — M810 Age-related osteoporosis without current pathological fracture: Secondary | ICD-10-CM | POA: Insufficient documentation

## 2021-12-29 DIAGNOSIS — C9001 Multiple myeloma in remission: Secondary | ICD-10-CM

## 2021-12-29 DIAGNOSIS — Z85020 Personal history of malignant carcinoid tumor of stomach: Secondary | ICD-10-CM | POA: Diagnosis not present

## 2021-12-29 LAB — CBC WITH DIFFERENTIAL (CANCER CENTER ONLY)
Abs Immature Granulocytes: 0 10*3/uL (ref 0.00–0.07)
Basophils Absolute: 0 10*3/uL (ref 0.0–0.1)
Basophils Relative: 2 %
Eosinophils Absolute: 0.1 10*3/uL (ref 0.0–0.5)
Eosinophils Relative: 4 %
HCT: 40.5 % (ref 36.0–46.0)
Hemoglobin: 13.7 g/dL (ref 12.0–15.0)
Immature Granulocytes: 0 %
Lymphocytes Relative: 34 %
Lymphs Abs: 0.7 10*3/uL (ref 0.7–4.0)
MCH: 31.4 pg (ref 26.0–34.0)
MCHC: 33.8 g/dL (ref 30.0–36.0)
MCV: 92.7 fL (ref 80.0–100.0)
Monocytes Absolute: 0.3 10*3/uL (ref 0.1–1.0)
Monocytes Relative: 15 %
Neutro Abs: 1 10*3/uL — ABNORMAL LOW (ref 1.7–7.7)
Neutrophils Relative %: 45 %
Platelet Count: 200 10*3/uL (ref 150–400)
RBC: 4.37 MIL/uL (ref 3.87–5.11)
RDW: 13.6 % (ref 11.5–15.5)
WBC Count: 2.1 10*3/uL — ABNORMAL LOW (ref 4.0–10.5)
nRBC: 0 % (ref 0.0–0.2)

## 2021-12-29 LAB — CMP (CANCER CENTER ONLY)
ALT: 16 U/L (ref 0–44)
AST: 14 U/L — ABNORMAL LOW (ref 15–41)
Albumin: 4.3 g/dL (ref 3.5–5.0)
Alkaline Phosphatase: 29 U/L — ABNORMAL LOW (ref 38–126)
Anion gap: 10 (ref 5–15)
BUN: 20 mg/dL (ref 6–20)
CO2: 27 mmol/L (ref 22–32)
Calcium: 9.4 mg/dL (ref 8.9–10.3)
Chloride: 103 mmol/L (ref 98–111)
Creatinine: 0.89 mg/dL (ref 0.44–1.00)
GFR, Estimated: >60 mL/min (ref >60)
Glucose, Bld: 105 mg/dL — ABNORMAL HIGH (ref 70–99)
Potassium: 4.1 mmol/L (ref 3.5–5.1)
Sodium: 140 mmol/L (ref 135–145)
Total Bilirubin: 0.5 mg/dL (ref 0.3–1.2)
Total Protein: 6.5 g/dL (ref 6.5–8.1)

## 2021-12-29 MED ORDER — METHOCARBAMOL 500 MG PO TABS: ORAL_TABLET | ORAL | 1 refills | Status: DC

## 2021-12-29 NOTE — Progress Notes (Addendum)
[place] OFFICE PROGRESS NOTE   Diagnosis:  Multiple myeloma  INTERVAL HISTORY:   Katherine Beard returns as scheduled. She continues maintenance Revlimid and every 3 month Zometa.  She denies nausea/vomiting.  Periodic loose stools.  No mouth sores.  She had recent mouth pain and underwent a root canal.  She reports there was no evidence of osteonecrosis.  The pain has resolved.  Stable chronic back pain.  She takes 2-3 oxycodone with Tylenol a day.  She applies heat and ice.  Robaxin as needed.  Nerve ablation is planned in the near future.  She remains very active.  She is working full-time.  She is exercising.  Objective:  Vital signs in last 24 hours:  Blood pressure 130/73, pulse (!) 50, temperature 98.1 F (36.7 C), temperature source Oral, resp. rate 18, height 5' 2"  (1.575 m), weight 175 lb 6.4 oz (79.6 kg), last menstrual period 03/06/2011, SpO2 100 %.    HEENT: No thrush or ulcers. Resp: Lungs clear bilaterally. Cardio: Regular rate and rhythm. GI: Abdomen soft and nontender.  No hepatosplenomegaly. Vascular: No leg edema. Skin: No rash.   Lab Results:  Lab Results  Component Value Date   WBC 2.1 (L) 12/29/2021   HGB 13.7 12/29/2021   HCT 40.5 12/29/2021   MCV 92.7 12/29/2021   PLT 200 12/29/2021   NEUTROABS 1.0 (L) 12/29/2021    Imaging:  No results found.  Medications: I have reviewed the patient's current medications.  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 Increase serum free lambda light chains Serum M spike MRI pelvis 09/01/2020-innumerable small foci of signal abnormality concerning for multiple myeloma involvement Metastatic bone survey 09/03/2020-no discrete lytic bone lesions, compression fractures at T12, L1, L2, and L3, osteopenia Cycle 1 RVD  09/14/2020 Daratumumab beginning 09/28/2020 Cycle 2 RVD 10/05/2020 plus daratumumab Week 3 daratumumab 10/12/2020 Normal lambda light chains 10/26/2020 Cycle 3 RVD plus daratumumab 10/26/2020 Cycle 4 RVD plus daratumumab 11/16/2020, daratumumab changed to an every 2-week schedule beginning 11/23/2020 Cycle 5 RVD plus daratumumab 12/07/2020 Cycle 6 RVD plus daratumumab 12/28/2020 Normal lambda light chains 01/05/2021 Revlimid maintenance 21/28 days beginning 01/18/2021 St[facility] cell harvest at [facility] 03/29/2021 Revlimid resumed 03/30/2021   2. Severe back pain MRI lumbar spine July 12, 2020-subacute compression fractures at T12 and L1 MRI lumbar spine July 30, 2020-subacute T12 and L1 compression fractures, new subacute superior L2 endplate deformity MRI lumbar spine September 01, 2020 new/progressive compression fractures at L2 and L3, acute compression fracture at L4, evolution of T12 and L1 compression fractures MRI pelvis September 01, 2020-innumerable small foci of signal abnormality in the pelvis and femurs, no fracture identified, concerning for multiple myeloma versus metastases Zometa beginning 09/21/2020-plan monthly x3 then every 3 months, next due 11/19/2021 MRI thoracic spine July 2022-new T3 compression fracture with multiple additional thoracic compression fractures 3. Melanoma left upper back 2014 4. History of diverticulitis 5. Mild hypercalcemia at presentation-resolved 6. Osteoporosis 7. Evusheld 8.  Depression-worsened depression symptoms 01/11/2021, referred to psychology      Disposition: Ms. Mefferd appears stable.  Most recent light chains stable in normal range.  Plan to continue maintenance Revlimid as she is currently taking.  Continue Zometa every 3 months, next due in August.  Chronic back pain related to previous compression fractures.  She will continue oxycodone/Tylenol, Robaxin, other therapies.  She will return for lab,  follow-up, Zometa in approximately 6 weeks.  We are available  to see her sooner as needed.    Ned Card ANP/GNP-BC   12/29/2021  8:34 AM  Addendum 9:31 AM-CBC resulted after she left the office.  She has mild to moderate neutropenia.  She is due to begin the next cycle of Revlimid 01/03/2022.  We will contact her with instructions to hold Revlimid, repeat a CBC 01/10/2022, further instructions once that result is available.  We will review neutropenic precautions with her.

## 2021-12-29 NOTE — Telephone Encounter (Signed)
Called and spoke to patient regarding message below.  Patient stated she will contact Dr. Phoebe Perch office to see what his recommendations are.  Per Ned Card, NP our office is ok with patient following Dr. Phoebe Perch recommendations regarding the Revlimid. Patient verbalized understanding and agreed to contact office with any additional questions or concerns.  Lab appointment for 01/10/22 was scheduled and confirmed with patient.  All questions were answered during this phone call.

## 2021-12-29 NOTE — Telephone Encounter (Signed)
-----   Message from Owens Shark, NP sent at 12/29/2021 12:32 PM EDT ----- Same recommendation and check CBC in 1 week. She can check with Dr Amalia Hailey also.  ----- Message ----- From: Teodoro Spray, RN Sent: 12/29/2021  11:20 AM EDT To: Owens Shark, NP  Called and informed patient of information below.  Patient states she was told by Revlimid pharmacist and by Dr. Little Ishikawa [facility]) that she doesn't not necessarily need to hold Revlimid unless ANC is less that 1.0.  Patient states it was held for low ANC in the past and stated Dr. Little Ishikawa said she would have been ok to continue taking it.   Please advise.   ----- Message ----- From: Owens Shark, NP Sent: 12/29/2021   9:34 AM EDT To: [facility]  Please let her know the neutrophil count is low.  Review neutropenic precautions with her.  Instruct her to not begin Revlimid 01/03/2022.  Return for follow-up CBC 01/10/2022 with further instructions regarding a start date for Revlimid at that time.

## 2021-12-31 ENCOUNTER — Other Ambulatory Visit: Payer: Self-pay | Admitting: *Deleted

## 2021-12-31 DIAGNOSIS — C9000 Multiple myeloma not having achieved remission: Secondary | ICD-10-CM

## 2022-01-10 ENCOUNTER — Encounter: Payer: Self-pay | Admitting: Nurse Practitioner

## 2022-01-10 ENCOUNTER — Inpatient Hospital Stay: Payer: 59

## 2022-01-10 ENCOUNTER — Inpatient Hospital Stay: Payer: 59 | Attending: Oncology

## 2022-01-10 ENCOUNTER — Other Ambulatory Visit: Payer: Self-pay | Admitting: Nurse Practitioner

## 2022-01-10 DIAGNOSIS — C9000 Multiple myeloma not having achieved remission: Secondary | ICD-10-CM

## 2022-01-10 LAB — CBC WITH DIFFERENTIAL (CANCER CENTER ONLY)
Abs Immature Granulocytes: 0.02 10*3/uL (ref 0.00–0.07)
Basophils Absolute: 0.1 10*3/uL (ref 0.0–0.1)
Basophils Relative: 1 %
Eosinophils Absolute: 0 10*3/uL (ref 0.0–0.5)
Eosinophils Relative: 1 %
HCT: 44.1 % (ref 36.0–46.0)
Hemoglobin: 14.9 g/dL (ref 12.0–15.0)
Immature Granulocytes: 0 %
Lymphocytes Relative: 23 %
Lymphs Abs: 1.1 10*3/uL (ref 0.7–4.0)
MCH: 31.5 pg (ref 26.0–34.0)
MCHC: 33.8 g/dL (ref 30.0–36.0)
MCV: 93.2 fL (ref 80.0–100.0)
Monocytes Absolute: 0.3 10*3/uL (ref 0.1–1.0)
Monocytes Relative: 7 %
Neutro Abs: 3.4 10*3/uL (ref 1.7–7.7)
Neutrophils Relative %: 68 %
Platelet Count: 252 10*3/uL (ref 150–400)
RBC: 4.73 MIL/uL (ref 3.87–5.11)
RDW: 13.7 % (ref 11.5–15.5)
WBC Count: 5 10*3/uL (ref 4.0–10.5)
nRBC: 0 % (ref 0.0–0.2)

## 2022-01-10 MED ORDER — OXYCODONE HCL 5 MG PO TABS: 5.0000 mg | ORAL_TABLET | Freq: Four times a day (QID) | ORAL | 0 refills | Status: DC | PRN

## 2022-01-11 ENCOUNTER — Other Ambulatory Visit: Payer: Self-pay | Admitting: Nurse Practitioner

## 2022-01-11 ENCOUNTER — Telehealth: Payer: Self-pay

## 2022-01-11 DIAGNOSIS — C9000 Multiple myeloma not having achieved remission: Secondary | ICD-10-CM

## 2022-01-11 MED ORDER — OXYCODONE HCL 5 MG PO TABS: 5.0000 mg | ORAL_TABLET | Freq: Four times a day (QID) | ORAL | 0 refills | Status: DC | PRN

## 2022-01-11 NOTE — Telephone Encounter (Signed)
Patient verbal understanding had no questions or concerns at this time

## 2022-01-11 NOTE — Telephone Encounter (Signed)
-----   Message from Owens Shark, NP sent at 01/10/2022  4:29 PM EDT ----- Please let her know the white count/ANC look good.  She can begin the next cycle of Revlimid.

## 2022-01-23 ENCOUNTER — Other Ambulatory Visit: Payer: Self-pay | Admitting: Oncology

## 2022-01-24 ENCOUNTER — Encounter: Payer: Self-pay | Admitting: Oncology

## 2022-01-24 ENCOUNTER — Telehealth: Payer: Self-pay | Admitting: Oncology

## 2022-01-24 NOTE — Telephone Encounter (Signed)
Attempted to contact patient in regards to schedule message 7/24. No answer so voicemail was left for patient to call back

## 2022-02-06 ENCOUNTER — Other Ambulatory Visit: Payer: Self-pay | Admitting: Nurse Practitioner

## 2022-02-06 ENCOUNTER — Encounter: Payer: Self-pay | Admitting: Nurse Practitioner

## 2022-02-06 DIAGNOSIS — C9000 Multiple myeloma not having achieved remission: Secondary | ICD-10-CM

## 2022-02-07 ENCOUNTER — Other Ambulatory Visit: Payer: Self-pay | Admitting: Nurse Practitioner

## 2022-02-07 DIAGNOSIS — C9000 Multiple myeloma not having achieved remission: Secondary | ICD-10-CM

## 2022-02-07 MED ORDER — OXYCODONE HCL 5 MG PO TABS: 5.0000 mg | ORAL_TABLET | Freq: Four times a day (QID) | ORAL | 0 refills | Status: DC | PRN

## 2022-02-08 ENCOUNTER — Inpatient Hospital Stay: Payer: 59 | Admitting: Oncology

## 2022-02-08 ENCOUNTER — Encounter: Payer: Self-pay | Admitting: *Deleted

## 2022-02-08 ENCOUNTER — Inpatient Hospital Stay: Payer: 59 | Attending: Oncology

## 2022-02-08 ENCOUNTER — Inpatient Hospital Stay: Payer: 59

## 2022-02-08 VITALS — BP 149/82 | HR 60 | Temp 98.1°F | Resp 20 | Ht 62.0 in | Wt 177.6 lb

## 2022-02-08 DIAGNOSIS — M8008XA Age-related osteoporosis with current pathological fracture, vertebra(e), initial encounter for fracture: Secondary | ICD-10-CM | POA: Insufficient documentation

## 2022-02-08 DIAGNOSIS — C9000 Multiple myeloma not having achieved remission: Secondary | ICD-10-CM

## 2022-02-08 DIAGNOSIS — Z85820 Personal history of malignant melanoma of skin: Secondary | ICD-10-CM | POA: Diagnosis not present

## 2022-02-08 DIAGNOSIS — M8588 Other specified disorders of bone density and structure, other site: Secondary | ICD-10-CM | POA: Insufficient documentation

## 2022-02-08 LAB — CMP (CANCER CENTER ONLY)
ALT: 15 U/L (ref 0–44)
AST: 13 U/L — ABNORMAL LOW (ref 15–41)
Albumin: 4.1 g/dL (ref 3.5–5.0)
Alkaline Phosphatase: 25 U/L — ABNORMAL LOW (ref 38–126)
Anion gap: 9 (ref 5–15)
BUN: 16 mg/dL (ref 6–20)
CO2: 26 mmol/L (ref 22–32)
Calcium: 9.2 mg/dL (ref 8.9–10.3)
Chloride: 105 mmol/L (ref 98–111)
Creatinine: 0.89 mg/dL (ref 0.44–1.00)
GFR, Estimated: >60 mL/min (ref >60)
Glucose, Bld: 95 mg/dL (ref 70–99)
Potassium: 4.4 mmol/L (ref 3.5–5.1)
Sodium: 140 mmol/L (ref 135–145)
Total Bilirubin: 0.4 mg/dL (ref 0.3–1.2)
Total Protein: 6 g/dL — ABNORMAL LOW (ref 6.5–8.1)

## 2022-02-08 LAB — CBC WITH DIFFERENTIAL (CANCER CENTER ONLY)
Abs Immature Granulocytes: 0.01 10*3/uL (ref 0.00–0.07)
Basophils Absolute: 0 10*3/uL (ref 0.0–0.1)
Basophils Relative: 2 %
Eosinophils Absolute: 0 10*3/uL (ref 0.0–0.5)
Eosinophils Relative: 2 %
HCT: 39.2 % (ref 36.0–46.0)
Hemoglobin: 13.4 g/dL (ref 12.0–15.0)
Immature Granulocytes: 0 %
Lymphocytes Relative: 29 %
Lymphs Abs: 0.8 10*3/uL (ref 0.7–4.0)
MCH: 31.9 pg (ref 26.0–34.0)
MCHC: 34.2 g/dL (ref 30.0–36.0)
MCV: 93.3 fL (ref 80.0–100.0)
Monocytes Absolute: 0.3 10*3/uL (ref 0.1–1.0)
Monocytes Relative: 13 %
Neutro Abs: 1.5 10*3/uL — ABNORMAL LOW (ref 1.7–7.7)
Neutrophils Relative %: 54 %
Platelet Count: 211 10*3/uL (ref 150–400)
RBC: 4.2 MIL/uL (ref 3.87–5.11)
RDW: 13.5 % (ref 11.5–15.5)
WBC Count: 2.6 10*3/uL — ABNORMAL LOW (ref 4.0–10.5)
nRBC: 0 % (ref 0.0–0.2)

## 2022-02-08 MED ORDER — ZOLEDRONIC ACID 4 MG/100ML IV SOLN
4.0000 mg | Freq: Once | INTRAVENOUS | Status: AC
  Administered 2022-02-08: 4 mg via INTRAVENOUS
  Filled 2022-02-08: qty 100

## 2022-02-08 MED ORDER — SODIUM CHLORIDE 0.9 % IV SOLN: Freq: Once | INTRAVENOUS | Status: AC

## 2022-02-08 NOTE — Progress Notes (Signed)
Patient seen by Dr. Benay Spice today  Vitals are within treatment parameters.  Labs reviewed by Dr. Benay Spice and are not all within treatment parameters. ANC 1.5--will continue Revlimid as ordered  Per physician team, patient is ready for treatment and there are NO modifications to the treatment plan. Zometa today.

## 2022-02-08 NOTE — Patient Instructions (Signed)

## 2022-02-08 NOTE — Progress Notes (Signed)
[place] OFFICE PROGRESS NOTE   Diagnosis: Multiple myeloma  INTERVAL HISTORY:   Katherine Beard returns as scheduled.  She continues Revlimid maintenance.  The last cycle of Revlimid was delayed due to neutropenia.  She has diarrhea once in the morning while on Revlimid.  She continues to have back and hip pain.  She is followed in the pain clinic and by orthopedics.  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  18 mm and 2 mm lesions were noted in the right anterior femoral neck and acetabulum potentially related to treated myeloma.  Objective:  Vital signs in last 24 hours:  Blood pressure (!) 149/82, pulse 60, temperature 98.1 F (36.7 C), temperature source Oral, resp. rate 20, height 5' 2"  (1.575 m), weight 177 lb 9.6 oz (80.6 kg), last menstrual period 03/06/2011, SpO2 100 %.    HEENT: No thrush or ulcers Resp: Lungs clear bilaterally Cardio: Regular rate and rhythm GI: No hepatosplenomegaly Vascular:  No leg edema  Lab Results:  Lab Results  Component Value Date   WBC 2.6 (L) 02/08/2022   HGB 13.4 02/08/2022   HCT 39.2 02/08/2022   MCV 93.3 02/08/2022   PLT 211 02/08/2022   NEUTROABS 1.5 (L) 02/08/2022    CMP  Lab Results  Component Value Date   NA 140 12/29/2021   K 4.1 12/29/2021   CL 103 12/29/2021   CO2 27 12/29/2021   GLUCOSE 105 (H) 12/29/2021   BUN 20 12/29/2021   CREATININE 0.89 12/29/2021   CALCIUM 9.4 12/29/2021   PROT 6.5 12/29/2021   ALBUMIN 4.3 12/29/2021   AST 14 (L) 12/29/2021   ALT 16 12/29/2021   ALKPHOS 29 (L) 12/29/2021   BILITOT 0.5 12/29/2021   GFRNONAA >60 12/29/2021   GFRAA 78 12/11/2006   Medications: I have reviewed the patient's current medications.   Assessment/Plan: Multiple myeloma Bone marrow biopsy 09/04/2020-increased plasma cells (57% on the aspirate differential, 70-80% on the biopsy), lambda light chain restricted, 91M-, duplication of  1q, 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 Increase serum free lambda light chains Serum M spike MRI pelvis 09/01/2020-innumerable small foci of signal abnormality concerning for multiple myeloma involvement Metastatic bone survey 09/03/2020-no discrete lytic bone lesions, compression fractures at T12, L1, L2, and L3, osteopenia Cycle 1 RVD 09/14/2020 Daratumumab beginning 09/28/2020 Cycle 2 RVD 10/05/2020 plus daratumumab Week 3 daratumumab 10/12/2020 Normal lambda light chains 10/26/2020 Cycle 3 RVD plus daratumumab 10/26/2020 Cycle 4 RVD plus daratumumab 11/16/2020, daratumumab changed to an every 2-week schedule beginning 11/23/2020 Cycle 5 RVD plus daratumumab 12/07/2020 Cycle 6 RVD plus daratumumab 12/28/2020 Normal lambda light chains 01/05/2021 Revlimid maintenance 21/28 days beginning 01/18/2021 Stem cell harvest at [facility] 03/29/2021 Revlimid resumed 03/30/2021   2. Severe back pain MRI lumbar spine July 12, 2020-subacute compression fractures at T12 and L1 MRI lumbar spine July 30, 2020-subacute T12 and L1 compression fractures, new subacute superior L2 endplate deformity MRI lumbar spine September 01, 2020 new/progressive compression fractures at L2 and L3, acute compression fracture at L4, evolution of T12 and L1 compression fractures MRI pelvis September 01, 2020-innumerable small foci of signal abnormality in the pelvis and femurs, no fracture identified, concerning for multiple myeloma versus metastases Zometa beginning 09/21/2020-plan monthly x3 then every 3 months, next due 11/19/2021 MRI thoracic spine July 2022-new T3 compression fracture with multiple additional thoracic compression fractures 3. Melanoma left upper back 2014 4. History of diverticulitis 5. Mild hypercalcemia at presentation-resolved  6. Osteoporosis 7. Evusheld 8.  Depression-worsened depression symptoms 01/11/2021, referred to psychology        Disposition: Katherine Beard appears unchanged.  She continues Revlimid maintenance.  She will begin  another cycle within the next 1-2 days.  We will consider dose reducing the Revlimid if she continues to have significant neutropenia.  She will return for a CBC approximately 2 weeks after resuming Revlimid.  She will receive Zometa today.  Katherine Beard will continue follow-up with orthopedics in the pain clinic for management of the bilateral hip and back pain.  We will follow-up on the myeloma panel from today.  She will return for an office and lab visit on 03/08/2022.  [name], MD  02/08/2022  8:30 AM

## 2022-02-09 LAB — KAPPA/LAMBDA LIGHT CHAINS
Kappa free light chain: 9.4 mg/L (ref 3.3–19.4)
Kappa, lambda light chain ratio: 1.07 (ref 0.26–1.65)
Lambda free light chains: 8.8 mg/L (ref 5.7–26.3)

## 2022-02-10 LAB — PROTEIN ELECTROPHORESIS, SERUM
A/G Ratio: 2.1 — ABNORMAL HIGH (ref 0.7–1.7)
Albumin ELP: 3.7 g/dL (ref 2.9–4.4)
Alpha-1-Globulin: 0.2 g/dL (ref 0.0–0.4)
Alpha-2-Globulin: 0.5 g/dL (ref 0.4–1.0)
Beta Globulin: 0.8 g/dL (ref 0.7–1.3)
Gamma Globulin: 0.3 g/dL — ABNORMAL LOW (ref 0.4–1.8)
Globulin, Total: 1.8 g/dL — ABNORMAL LOW (ref 2.2–3.9)
Total Protein ELP: 5.5 g/dL — ABNORMAL LOW (ref 6.0–8.5)

## 2022-02-11 ENCOUNTER — Ambulatory Visit: Payer: Self-pay | Admitting: Oncology

## 2022-02-11 ENCOUNTER — Ambulatory Visit: Payer: Self-pay

## 2022-02-11 ENCOUNTER — Other Ambulatory Visit: Payer: 59

## 2022-02-25 ENCOUNTER — Inpatient Hospital Stay: Payer: 59

## 2022-02-25 DIAGNOSIS — C9000 Multiple myeloma not having achieved remission: Secondary | ICD-10-CM | POA: Diagnosis not present

## 2022-02-25 LAB — CBC WITH DIFFERENTIAL (CANCER CENTER ONLY)
Abs Immature Granulocytes: 0.02 10*3/uL (ref 0.00–0.07)
Basophils Absolute: 0 10*3/uL (ref 0.0–0.1)
Basophils Relative: 1 %
Eosinophils Absolute: 0.1 10*3/uL (ref 0.0–0.5)
Eosinophils Relative: 4 %
HCT: 39.5 % (ref 36.0–46.0)
Hemoglobin: 13.6 g/dL (ref 12.0–15.0)
Immature Granulocytes: 1 %
Lymphocytes Relative: 19 %
Lymphs Abs: 0.6 10*3/uL — ABNORMAL LOW (ref 0.7–4.0)
MCH: 31.7 pg (ref 26.0–34.0)
MCHC: 34.4 g/dL (ref 30.0–36.0)
MCV: 92.1 fL (ref 80.0–100.0)
Monocytes Absolute: 0.4 10*3/uL (ref 0.1–1.0)
Monocytes Relative: 11 %
Neutro Abs: 2 10*3/uL (ref 1.7–7.7)
Neutrophils Relative %: 64 %
Platelet Count: 211 10*3/uL (ref 150–400)
RBC: 4.29 MIL/uL (ref 3.87–5.11)
RDW: 13.3 % (ref 11.5–15.5)
WBC Count: 3.2 10*3/uL — ABNORMAL LOW (ref 4.0–10.5)
nRBC: 0 % (ref 0.0–0.2)

## 2022-02-28 ENCOUNTER — Other Ambulatory Visit: Payer: Self-pay | Admitting: Nurse Practitioner

## 2022-02-28 DIAGNOSIS — C9000 Multiple myeloma not having achieved remission: Secondary | ICD-10-CM

## 2022-03-01 ENCOUNTER — Other Ambulatory Visit: Payer: Self-pay | Admitting: Oncology

## 2022-03-01 ENCOUNTER — Encounter: Payer: Self-pay | Admitting: Oncology

## 2022-03-01 ENCOUNTER — Other Ambulatory Visit: Payer: Self-pay | Admitting: Nurse Practitioner

## 2022-03-01 DIAGNOSIS — C9000 Multiple myeloma not having achieved remission: Secondary | ICD-10-CM

## 2022-03-01 MED ORDER — LENALIDOMIDE 10 MG PO CAPS: 10.0000 mg | ORAL_CAPSULE | Freq: Every day | ORAL | 0 refills | Status: DC

## 2022-03-01 MED ORDER — DIAZEPAM 5 MG PO TABS: 5.0000 mg | ORAL_TABLET | Freq: Two times a day (BID) | ORAL | 0 refills | Status: DC | PRN

## 2022-03-06 ENCOUNTER — Other Ambulatory Visit: Payer: Self-pay | Admitting: Oncology

## 2022-03-08 ENCOUNTER — Inpatient Hospital Stay: Payer: 59 | Attending: Oncology | Admitting: Nurse Practitioner

## 2022-03-08 ENCOUNTER — Inpatient Hospital Stay: Payer: 59

## 2022-03-08 ENCOUNTER — Encounter: Payer: Self-pay | Admitting: Nurse Practitioner

## 2022-03-08 VITALS — BP 129/83 | HR 62 | Temp 98.1°F | Resp 16 | Wt 170.8 lb

## 2022-03-08 DIAGNOSIS — M8588 Other specified disorders of bone density and structure, other site: Secondary | ICD-10-CM | POA: Diagnosis not present

## 2022-03-08 DIAGNOSIS — F32A Depression, unspecified: Secondary | ICD-10-CM | POA: Insufficient documentation

## 2022-03-08 DIAGNOSIS — C9000 Multiple myeloma not having achieved remission: Secondary | ICD-10-CM

## 2022-03-08 DIAGNOSIS — M8008XA Age-related osteoporosis with current pathological fracture, vertebra(e), initial encounter for fracture: Secondary | ICD-10-CM | POA: Diagnosis not present

## 2022-03-08 DIAGNOSIS — Z85820 Personal history of malignant melanoma of skin: Secondary | ICD-10-CM | POA: Insufficient documentation

## 2022-03-08 LAB — CBC WITH DIFFERENTIAL (CANCER CENTER ONLY)
Abs Immature Granulocytes: 0.03 10*3/uL (ref 0.00–0.07)
Basophils Absolute: 0.1 10*3/uL (ref 0.0–0.1)
Basophils Relative: 1 %
Eosinophils Absolute: 0.1 10*3/uL (ref 0.0–0.5)
Eosinophils Relative: 2 %
HCT: 44.2 % (ref 36.0–46.0)
Hemoglobin: 15.2 g/dL — ABNORMAL HIGH (ref 12.0–15.0)
Immature Granulocytes: 1 %
Lymphocytes Relative: 22 %
Lymphs Abs: 1 10*3/uL (ref 0.7–4.0)
MCH: 31.9 pg (ref 26.0–34.0)
MCHC: 34.4 g/dL (ref 30.0–36.0)
MCV: 92.7 fL (ref 80.0–100.0)
Monocytes Absolute: 0.5 10*3/uL (ref 0.1–1.0)
Monocytes Relative: 12 %
Neutro Abs: 2.7 10*3/uL (ref 1.7–7.7)
Neutrophils Relative %: 62 %
Platelet Count: 287 10*3/uL (ref 150–400)
RBC: 4.77 MIL/uL (ref 3.87–5.11)
RDW: 13.2 % (ref 11.5–15.5)
WBC Count: 4.3 10*3/uL (ref 4.0–10.5)
nRBC: 0 % (ref 0.0–0.2)

## 2022-03-08 LAB — CMP (CANCER CENTER ONLY)
ALT: 17 U/L (ref 0–44)
AST: 13 U/L — ABNORMAL LOW (ref 15–41)
Albumin: 4.5 g/dL (ref 3.5–5.0)
Alkaline Phosphatase: 32 U/L — ABNORMAL LOW (ref 38–126)
Anion gap: 9 (ref 5–15)
BUN: 25 mg/dL — ABNORMAL HIGH (ref 6–20)
CO2: 26 mmol/L (ref 22–32)
Calcium: 9.3 mg/dL (ref 8.9–10.3)
Chloride: 104 mmol/L (ref 98–111)
Creatinine: 0.91 mg/dL (ref 0.44–1.00)
GFR, Estimated: >60 mL/min (ref >60)
Glucose, Bld: 84 mg/dL (ref 70–99)
Potassium: 3.9 mmol/L (ref 3.5–5.1)
Sodium: 139 mmol/L (ref 135–145)
Total Bilirubin: 0.4 mg/dL (ref 0.3–1.2)
Total Protein: 6.7 g/dL (ref 6.5–8.1)

## 2022-03-08 MED ORDER — OXYCODONE HCL 5 MG PO TABS: 5.0000 mg | ORAL_TABLET | Freq: Four times a day (QID) | ORAL | 0 refills | Status: DC | PRN

## 2022-03-08 NOTE — Progress Notes (Signed)
[place] OFFICE PROGRESS NOTE   Diagnosis: Multiple myeloma  INTERVAL HISTORY:   Ms. Oubre returns as scheduled.  She completed another cycle of Revlimid maintenance 02/10/2022.  When she is taking Revlimid she notes stools are "soft".  Last week, when off of Revlimid, she had an episode of nausea/vomiting and 4 days of diarrhea.  Symptoms have resolved.  This occurred following travel.  She wonders if symptoms were related to travel.  She feels better now.  She had bilateral hip injections 02/25/2022.  Hip pain is better.  She would like to taper off of Lexapro.  Objective:  Vital signs in last 24 hours:  Blood pressure 129/83, pulse 62, temperature 98.1 F (36.7 C), temperature source Tympanic, resp. rate 16, weight 170 lb 12.8 oz (77.5 kg), last menstrual period 03/06/2011, SpO2 100 %.    HEENT: No thrush or ulcers. Resp: Lungs clear bilaterally. Cardio: Regular rate and rhythm. GI: Abdomen soft and nontender.  No hepatosplenomegaly. Vascular: No leg edema. Skin: No rash.   Lab Results:  Lab Results  Component Value Date   WBC 4.3 03/08/2022   HGB 15.2 (H) 03/08/2022   HCT 44.2 03/08/2022   MCV 92.7 03/08/2022   PLT 287 03/08/2022   NEUTROABS 2.7 03/08/2022    Imaging:  No results found.  Medications: I have reviewed the patient's current medications.  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 Increase serum free lambda light chains Serum M spike MRI pelvis 09/01/2020-innumerable small foci of signal abnormality concerning for multiple myeloma involvement Metastatic bone survey 09/03/2020-no discrete lytic bone lesions, compression fractures at T12, L1, L2, and L3, osteopenia Cycle 1 RVD 09/14/2020 Daratumumab beginning 09/28/2020 Cycle 2 RVD 10/05/2020 plus daratumumab Week 3  daratumumab 10/12/2020 Normal lambda light chains 10/26/2020 Cycle 3 RVD plus daratumumab 10/26/2020 Cycle 4 RVD plus daratumumab 11/16/2020, daratumumab changed to an every 2-week schedule beginning 11/23/2020 Cycle 5 RVD plus daratumumab 12/07/2020 Cycle 6 RVD plus daratumumab 12/28/2020 Normal lambda light chains 01/05/2021 Revlimid maintenance 21/28 days beginning 01/18/2021 Stem cell harvest at [facility] 03/29/2021 Revlimid resumed 03/30/2021   2. Severe back pain MRI lumbar spine July 12, 2020-subacute compression fractures at T12 and L1 MRI lumbar spine July 30, 2020-subacute T12 and L1 compression fractures, new subacute superior L2 endplate deformity MRI lumbar spine September 01, 2020 new/progressive compression fractures at L2 and L3, acute compression fracture at L4, evolution of T12 and L1 compression fractures MRI pelvis September 01, 2020-innumerable small foci of signal abnormality in the pelvis and femurs, no fracture identified, concerning for multiple myeloma versus metastases Zometa beginning 09/21/2020-plan monthly x3 then every 3 months, next due 11/19/2021 MRI thoracic spine July 2022-new T3 compression fracture with multiple additional thoracic compression fractures 3. Melanoma left upper back 2014 4. History of diverticulitis 5. Mild hypercalcemia at presentation-resolved 6. Osteoporosis 7. Evusheld 8.  Depression-worsened depression symptoms 01/11/2021, referred to psychology      Disposition: Ms. Abbasi appears stable.  She will begin another cycle of maintenance Revlimid 03/10/2022.  We will follow-up on the serum protein electrophoresis from today.  CBC and chemistry panel reviewed.  Labs adequate to begin the next cycle of Revlimid as outlined above.  The white count is in normal range today.  This may be related to the steroid injections she recently received.  She had diarrhea that week she was off of Revlimid.  Unclear if this was related to Revlimid.  She will contact the office  if the diarrhea recurs.  She would like to taper off of Lexapro.  She was provided with instructions to take every other day for a month, then every Monday and Friday for a month, then every Monday for a month, then discontinue.  She will return for lab and follow-up in 4 weeks.  We are available to see her sooner if needed.    Ned Card ANP/GNP-BC   03/08/2022  10:03 AM

## 2022-03-10 LAB — PROTEIN ELECTROPHORESIS, SERUM
A/G Ratio: 1.7 (ref 0.7–1.7)
Albumin ELP: 3.7 g/dL (ref 2.9–4.4)
Alpha-1-Globulin: 0.2 g/dL (ref 0.0–0.4)
Alpha-2-Globulin: 0.6 g/dL (ref 0.4–1.0)
Beta Globulin: 0.9 g/dL (ref 0.7–1.3)
Gamma Globulin: 0.5 g/dL (ref 0.4–1.8)
Globulin, Total: 2.2 g/dL (ref 2.2–3.9)
Total Protein ELP: 5.9 g/dL — ABNORMAL LOW (ref 6.0–8.5)

## 2022-03-29 ENCOUNTER — Other Ambulatory Visit: Payer: Self-pay | Admitting: Oncology

## 2022-03-30 ENCOUNTER — Other Ambulatory Visit: Payer: Self-pay | Admitting: Oncology

## 2022-04-08 ENCOUNTER — Other Ambulatory Visit: Payer: Self-pay | Admitting: Nurse Practitioner

## 2022-04-08 DIAGNOSIS — C9000 Multiple myeloma not having achieved remission: Secondary | ICD-10-CM

## 2022-04-08 MED ORDER — OXYCODONE HCL 5 MG PO TABS: 5.0000 mg | ORAL_TABLET | Freq: Four times a day (QID) | ORAL | 0 refills | Status: DC | PRN

## 2022-04-11 ENCOUNTER — Encounter: Payer: Self-pay | Admitting: Oncology

## 2022-04-11 ENCOUNTER — Inpatient Hospital Stay: Payer: 59 | Admitting: Oncology

## 2022-04-11 ENCOUNTER — Inpatient Hospital Stay: Payer: 59 | Attending: Oncology

## 2022-04-11 VITALS — BP 117/70 | HR 60 | Temp 98.2°F | Resp 18 | Ht 62.0 in | Wt 173.0 lb

## 2022-04-11 DIAGNOSIS — M898X9 Other specified disorders of bone, unspecified site: Secondary | ICD-10-CM | POA: Insufficient documentation

## 2022-04-11 DIAGNOSIS — F32A Depression, unspecified: Secondary | ICD-10-CM | POA: Diagnosis not present

## 2022-04-11 DIAGNOSIS — M8008XA Age-related osteoporosis with current pathological fracture, vertebra(e), initial encounter for fracture: Secondary | ICD-10-CM | POA: Diagnosis not present

## 2022-04-11 DIAGNOSIS — Z85820 Personal history of malignant melanoma of skin: Secondary | ICD-10-CM | POA: Insufficient documentation

## 2022-04-11 DIAGNOSIS — C9000 Multiple myeloma not having achieved remission: Secondary | ICD-10-CM | POA: Diagnosis present

## 2022-04-11 DIAGNOSIS — M8588 Other specified disorders of bone density and structure, other site: Secondary | ICD-10-CM | POA: Insufficient documentation

## 2022-04-11 LAB — CMP (CANCER CENTER ONLY)
ALT: 27 U/L (ref 0–44)
AST: 23 U/L (ref 15–41)
Albumin: 4.3 g/dL (ref 3.5–5.0)
Alkaline Phosphatase: 39 U/L (ref 38–126)
Anion gap: 10 (ref 5–15)
BUN: 20 mg/dL (ref 6–20)
CO2: 26 mmol/L (ref 22–32)
Calcium: 9.2 mg/dL (ref 8.9–10.3)
Chloride: 100 mmol/L (ref 98–111)
Creatinine: 0.91 mg/dL (ref 0.44–1.00)
GFR, Estimated: >60 mL/min (ref >60)
Glucose, Bld: 93 mg/dL (ref 70–99)
Potassium: 4.2 mmol/L (ref 3.5–5.1)
Sodium: 136 mmol/L (ref 135–145)
Total Bilirubin: 0.5 mg/dL (ref 0.3–1.2)
Total Protein: 6.6 g/dL (ref 6.5–8.1)

## 2022-04-11 LAB — CBC WITH DIFFERENTIAL (CANCER CENTER ONLY)
Abs Immature Granulocytes: 0.01 10*3/uL (ref 0.00–0.07)
Basophils Absolute: 0.1 10*3/uL (ref 0.0–0.1)
Basophils Relative: 3 %
Eosinophils Absolute: 0.1 10*3/uL (ref 0.0–0.5)
Eosinophils Relative: 4 %
HCT: 41 % (ref 36.0–46.0)
Hemoglobin: 13.9 g/dL (ref 12.0–15.0)
Immature Granulocytes: 0 %
Lymphocytes Relative: 27 %
Lymphs Abs: 0.7 10*3/uL (ref 0.7–4.0)
MCH: 31.8 pg (ref 26.0–34.0)
MCHC: 33.9 g/dL (ref 30.0–36.0)
MCV: 93.8 fL (ref 80.0–100.0)
Monocytes Absolute: 0.3 10*3/uL (ref 0.1–1.0)
Monocytes Relative: 13 %
Neutro Abs: 1.5 10*3/uL — ABNORMAL LOW (ref 1.7–7.7)
Neutrophils Relative %: 53 %
Platelet Count: 238 10*3/uL (ref 150–400)
RBC: 4.37 MIL/uL (ref 3.87–5.11)
RDW: 13.3 % (ref 11.5–15.5)
WBC Count: 2.7 10*3/uL — ABNORMAL LOW (ref 4.0–10.5)
nRBC: 0 % (ref 0.0–0.2)

## 2022-04-11 NOTE — Progress Notes (Signed)
[place] OFFICE PROGRESS NOTE   Diagnosis: Multiple myeloma  INTERVAL HISTORY:   Katherine Beard returns as scheduled.  She feels well.  She is exercising.  She continues to have intermittent bone pain.  She uses Voltaren gel on the hips.  She takes oxycodone as needed.  She began another cycle of Revlimid on 04/06/2022. She reports intermittent episodes of "dizziness "lasting for seconds.  This has occurred for the past few weeks.  She has intermittent mild diarrhea while on Revlimid.  No neuropathy symptoms. Objective:  Vital signs in last 24 hours:  Blood pressure 117/70, pulse 60, temperature 98.2 F (36.8 C), temperature source Oral, resp. rate 18, height 5' 2"  (1.575 m), weight 173 lb (78.5 kg), last menstrual period 03/06/2011, SpO2 100 %.    HEENT: No thrush Resp: Lungs clear bilaterally Cardio: Regular rate and rhythm GI: Nontender, no hepatosplenomegaly Vascular: No leg edema Neuro: Alert and oriented    Lab Results:  Lab Results  Component Value Date   WBC 2.7 (L) 04/11/2022   HGB 13.9 04/11/2022   HCT 41.0 04/11/2022   MCV 93.8 04/11/2022   PLT 238 04/11/2022   NEUTROABS 1.5 (L) 04/11/2022    CMP  Lab Results  Component Value Date   NA 139 03/08/2022   K 3.9 03/08/2022   CL 104 03/08/2022   CO2 26 03/08/2022   GLUCOSE 84 03/08/2022   BUN 25 (H) 03/08/2022   CREATININE 0.91 03/08/2022   CALCIUM 9.3 03/08/2022   PROT 6.7 03/08/2022   ALBUMIN 4.5 03/08/2022   AST 13 (L) 03/08/2022   ALT 17 03/08/2022   ALKPHOS 32 (L) 03/08/2022   BILITOT 0.4 03/08/2022   GFRNONAA >60 03/08/2022   GFRAA 78 12/11/2006    Medications: I have reviewed the patient's current medications.   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 Increase serum free lambda light chains Serum M  spike MRI pelvis 09/01/2020-innumerable small foci of signal abnormality concerning for multiple myeloma involvement Metastatic bone survey 09/03/2020-no discrete lytic bone lesions, compression fractures at T12, L1, L2, and L3, osteopenia Cycle 1 RVD 09/14/2020 Daratumumab beginning 09/28/2020 Cycle 2 RVD 10/05/2020 plus daratumumab Week 3 daratumumab 10/12/2020 Normal lambda light chains 10/26/2020 Cycle 3 RVD plus daratumumab 10/26/2020 Cycle 4 RVD plus daratumumab 11/16/2020, daratumumab changed to an every 2-week schedule beginning 11/23/2020 Cycle 5 RVD plus daratumumab 12/07/2020 Cycle 6 RVD plus daratumumab 12/28/2020 Normal lambda light chains 01/05/2021 Revlimid maintenance 21/28 days beginning 01/18/2021 Stem cell harvest at [facility] 03/29/2021 Revlimid resumed 03/30/2021   2. Severe back pain MRI lumbar spine July 12, 2020-subacute compression fractures at T12 and L1 MRI lumbar spine July 30, 2020-subacute T12 and L1 compression fractures, new subacute superior L2 endplate deformity MRI lumbar spine September 01, 2020 new/progressive compression fractures at L2 and L3, acute compression fracture at L4, evolution of T12 and L1 compression fractures MRI pelvis September 01, 2020-innumerable small foci of signal abnormality in the pelvis and femurs, no fracture identified, concerning for multiple myeloma versus metastases Zometa beginning 09/21/2020-plan monthly x3 then every 3 months, next due 11/19/2021 MRI thoracic spine July 2022-new T3 compression fracture with multiple additional thoracic compression fractures 3. Melanoma left upper back 2014 4. History of diverticulitis 5. Mild hypercalcemia at presentation-resolved 6. Osteoporosis 7. Evusheld 8.  Depression-worsened depression symptoms 01/11/2021, referred to psychology  Disposition: Katherine Beard appears stable.  She continues Revlimid maintenance.  We will follow-up on the myeloma panel from today.  She will return for an office visit and  Zometa in 1 month.  She is scheduled to see [name] in early November.  The etiology of the "dizzy "episodes is unclear.  She will call if these persist or worsen.  She has mild neutropenia.  She will continue Revlimid.  She will return for a CBC prior to beginning the next cycle of Revlimid.  [name], MD  04/11/2022  8:34 AM

## 2022-04-12 LAB — KAPPA/LAMBDA LIGHT CHAINS
Kappa free light chain: 11.8 mg/L (ref 3.3–19.4)
Kappa, lambda light chain ratio: 1.22 (ref 0.26–1.65)
Lambda free light chains: 9.7 mg/L (ref 5.7–26.3)

## 2022-04-14 LAB — PROTEIN ELECTROPHORESIS, SERUM
A/G Ratio: 1.6 (ref 0.7–1.7)
Albumin ELP: 3.7 g/dL (ref 2.9–4.4)
Alpha-1-Globulin: 0.3 g/dL (ref 0.0–0.4)
Alpha-2-Globulin: 0.6 g/dL (ref 0.4–1.0)
Beta Globulin: 0.9 g/dL (ref 0.7–1.3)
Gamma Globulin: 0.5 g/dL (ref 0.4–1.8)
Globulin, Total: 2.3 g/dL (ref 2.2–3.9)
Total Protein ELP: 6 g/dL (ref 6.0–8.5)

## 2022-04-26 ENCOUNTER — Other Ambulatory Visit: Payer: Self-pay | Admitting: Oncology

## 2022-04-29 ENCOUNTER — Other Ambulatory Visit: Payer: Self-pay | Admitting: Oncology

## 2022-04-29 DIAGNOSIS — C9000 Multiple myeloma not having achieved remission: Secondary | ICD-10-CM

## 2022-05-02 ENCOUNTER — Inpatient Hospital Stay: Payer: 59

## 2022-05-02 DIAGNOSIS — C9000 Multiple myeloma not having achieved remission: Secondary | ICD-10-CM

## 2022-05-02 LAB — CBC WITH DIFFERENTIAL (CANCER CENTER ONLY)
Abs Immature Granulocytes: 0.01 10*3/uL (ref 0.00–0.07)
Basophils Absolute: 0 10*3/uL (ref 0.0–0.1)
Basophils Relative: 1 %
Eosinophils Absolute: 0.1 10*3/uL (ref 0.0–0.5)
Eosinophils Relative: 4 %
HCT: 38.6 % (ref 36.0–46.0)
Hemoglobin: 13.2 g/dL (ref 12.0–15.0)
Immature Granulocytes: 0 %
Lymphocytes Relative: 28 %
Lymphs Abs: 0.8 10*3/uL (ref 0.7–4.0)
MCH: 32.3 pg (ref 26.0–34.0)
MCHC: 34.2 g/dL (ref 30.0–36.0)
MCV: 94.4 fL (ref 80.0–100.0)
Monocytes Absolute: 0.4 10*3/uL (ref 0.1–1.0)
Monocytes Relative: 14 %
Neutro Abs: 1.5 10*3/uL — ABNORMAL LOW (ref 1.7–7.7)
Neutrophils Relative %: 53 %
Platelet Count: 224 10*3/uL (ref 150–400)
RBC: 4.09 MIL/uL (ref 3.87–5.11)
RDW: 13.3 % (ref 11.5–15.5)
WBC Count: 2.8 10*3/uL — ABNORMAL LOW (ref 4.0–10.5)
nRBC: 0 % (ref 0.0–0.2)

## 2022-05-02 LAB — BASIC METABOLIC PANEL - CANCER CENTER ONLY
Anion gap: 10 (ref 5–15)
BUN: 24 mg/dL — ABNORMAL HIGH (ref 6–20)
CO2: 25 mmol/L (ref 22–32)
Calcium: 9 mg/dL (ref 8.9–10.3)
Chloride: 104 mmol/L (ref 98–111)
Creatinine: 0.92 mg/dL (ref 0.44–1.00)
GFR, Estimated: >60 mL/min (ref >60)
Glucose, Bld: 105 mg/dL — ABNORMAL HIGH (ref 70–99)
Potassium: 4.1 mmol/L (ref 3.5–5.1)
Sodium: 139 mmol/L (ref 135–145)

## 2022-05-03 ENCOUNTER — Encounter: Payer: Self-pay | Admitting: Oncology

## 2022-05-06 ENCOUNTER — Other Ambulatory Visit: Payer: Self-pay | Admitting: Neurosurgery

## 2022-05-06 DIAGNOSIS — C9000 Multiple myeloma not having achieved remission: Secondary | ICD-10-CM

## 2022-05-08 ENCOUNTER — Other Ambulatory Visit: Payer: Self-pay | Admitting: Nurse Practitioner

## 2022-05-08 DIAGNOSIS — C9000 Multiple myeloma not having achieved remission: Secondary | ICD-10-CM

## 2022-05-09 ENCOUNTER — Telehealth: Payer: Self-pay

## 2022-05-09 ENCOUNTER — Other Ambulatory Visit: Payer: Self-pay | Admitting: Nurse Practitioner

## 2022-05-09 DIAGNOSIS — C9000 Multiple myeloma not having achieved remission: Secondary | ICD-10-CM

## 2022-05-09 MED ORDER — OXYCODONE HCL 5 MG PO TABS: 5.0000 mg | ORAL_TABLET | Freq: Four times a day (QID) | ORAL | 0 refills | Status: DC | PRN

## 2022-05-09 NOTE — Telephone Encounter (Signed)
Patient called and requested a refill of her Oxy IR, I placed the request on NP's deck.

## 2022-05-12 ENCOUNTER — Inpatient Hospital Stay: Payer: 59

## 2022-05-12 ENCOUNTER — Inpatient Hospital Stay: Payer: 59 | Attending: Oncology | Admitting: Oncology

## 2022-05-12 VITALS — BP 115/72 | HR 62 | Temp 98.3°F | Resp 18

## 2022-05-12 VITALS — BP 120/84 | HR 73 | Temp 98.2°F | Resp 18 | Ht 62.0 in | Wt 174.8 lb

## 2022-05-12 DIAGNOSIS — Z85820 Personal history of malignant melanoma of skin: Secondary | ICD-10-CM | POA: Insufficient documentation

## 2022-05-12 DIAGNOSIS — C9001 Multiple myeloma in remission: Secondary | ICD-10-CM

## 2022-05-12 DIAGNOSIS — C9000 Multiple myeloma not having achieved remission: Secondary | ICD-10-CM | POA: Insufficient documentation

## 2022-05-12 DIAGNOSIS — M8588 Other specified disorders of bone density and structure, other site: Secondary | ICD-10-CM | POA: Diagnosis not present

## 2022-05-12 MED ORDER — ZOLEDRONIC ACID 4 MG/100ML IV SOLN
4.0000 mg | Freq: Once | INTRAVENOUS | Status: AC
  Administered 2022-05-12: 4 mg via INTRAVENOUS
  Filled 2022-05-12: qty 100

## 2022-05-12 MED ORDER — SODIUM CHLORIDE 0.9 % IV SOLN: INTRAVENOUS | Status: DC

## 2022-05-12 NOTE — Progress Notes (Signed)
Patient seen by Dr. Benay Spice today  Vitals are within treatment parameters.  Labs reviewed by Dr. Benay Spice and are within treatment parameters. Per MD Benay Spice, ok to use labs from [facility] on 05/10/22.  Per physician team, patient is ready for treatment and there are NO modifications to the treatment plan.

## 2022-05-12 NOTE — Patient Instructions (Signed)

## 2022-05-12 NOTE — Progress Notes (Signed)
[place] OFFICE PROGRESS NOTE   Diagnosis: Multiple myeloma  INTERVAL HISTORY:   Katherine Beard returns as scheduled.  She continues Revlimid maintenance.  She began the most recent cycle on 05/04/2022.  She continues to have back and hip pain.  She is followed by Dr. Saintclair Halsted and is scheduled to see an orthopedic oncologist at [facility].  He takes approximately 2-3 oxycodone tablets per day.  She continues pool exercises.  She was seen at [facility] earlier this week.  The serum light chains were normal and no monoclonal protein was detected.  Objective:  Vital signs in last 24 hours:  Blood pressure 120/84, pulse 73, temperature 98.2 F (36.8 C), temperature source Oral, resp. rate 18, height 5' 2" (1.575 m), weight 174 lb 12.8 oz (79.3 kg), last menstrual period 03/06/2011, SpO2 98 %.    Resp: Lungs clear bilaterally Cardio: Regular rate and rhythm GI: No hepatosplenomegaly Vascular: No leg edema   Lab Results:  Lab Results  Component Value Date   WBC 2.8 (L) 05/02/2022   HGB 13.2 05/02/2022   HCT 38.6 05/02/2022   MCV 94.4 05/02/2022   PLT 224 05/02/2022   NEUTROABS 1.5 (L) 05/02/2022    CMP  Lab Results  Component Value Date   NA 139 05/02/2022   K 4.1 05/02/2022   CL 104 05/02/2022   CO2 25 05/02/2022   GLUCOSE 105 (H) 05/02/2022   BUN 24 (H) 05/02/2022   CREATININE 0.92 05/02/2022   CALCIUM 9.0 05/02/2022   PROT 6.6 04/11/2022   ALBUMIN 4.3 04/11/2022   AST 23 04/11/2022   ALT 27 04/11/2022   ALKPHOS 39 04/11/2022   BILITOT 0.5 04/11/2022   GFRNONAA >60 05/02/2022   GFRAA 78 12/11/2006    Medications: I have reviewed the patient's current medications.   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 Increase serum free lambda light chains Serum M spike MRI pelvis  09/01/2020-innumerable small foci of signal abnormality concerning for multiple myeloma involvement Metastatic bone survey 09/03/2020-no discrete lytic bone lesions, compression fractures at T12, L1, L2, and L3, osteopenia Cycle 1 RVD 09/14/2020 Daratumumab beginning 09/28/2020 Cycle 2 RVD 10/05/2020 plus daratumumab Week 3 daratumumab 10/12/2020 Normal lambda light chains 10/26/2020 Cycle 3 RVD plus daratumumab 10/26/2020 Cycle 4 RVD plus daratumumab 11/16/2020, daratumumab changed to an every 2-week schedule beginning 11/23/2020 Cycle 5 RVD plus daratumumab 12/07/2020 Cycle 6 RVD plus daratumumab 12/28/2020 Normal lambda light chains 01/05/2021 Revlimid maintenance 21/28 days beginning 01/18/2021 St[facility] cell harvest at [facility] 03/29/2021 Revlimid resumed 03/30/2021   2. Severe back pain MRI lumbar spine July 12, 2020-subacute compression fractures at T12 and L1 MRI lumbar spine July 30, 2020-subacute T12 and L1 compression fractures, new subacute superior L2 endplate deformity MRI lumbar spine September 01, 2020 new/progressive compression fractures at L2 and L3, acute compression fracture at L4, evolution of T12 and L1 compression fractures MRI pelvis September 01, 2020-innumerable small foci of signal abnormality in the pelvis and femurs, no fracture identified, concerning for multiple myeloma versus metastases Zometa beginning 09/21/2020-plan monthly x3 then every 3 months, next due 11/19/2021 MRI thoracic spine July 2022-new T3 compression fracture with multiple additional thoracic compression fractures 3. Melanoma left upper back 2014 4. History of diverticulitis 5. Mild hypercalcemia at presentation-resolved 6. Osteoporosis 7. Evusheld 8.  Depression-worsened depression symptoms 01/11/2021, referred to psychology  Disposition: Katherine Beard is in clinical remission from myeloma.  She continues Revlimid maintenance.  She is tolerating the Revlimid well.  She has mild neutropenia.  She will return for a CBC  prior to starting the next cycle of Revlimid on 06/01/2022.  She will continue every 79-monthZometa.  She will be scheduled for an office visit on 06/29/2022.  She will continue follow-up with neurosurgery and orthopedics for management of back pain.  GBetsy Coder MD  05/12/2022  9:25 AM

## 2022-05-16 ENCOUNTER — Other Ambulatory Visit: Payer: Self-pay | Admitting: Neurosurgery

## 2022-05-16 ENCOUNTER — Ambulatory Visit
Admission: RE | Admit: 2022-05-16 | Discharge: 2022-05-16 | Disposition: A | Discharge status: Home / Self Care | Payer: 59 | Source: Ambulatory Visit | Attending: Neurosurgery | Admitting: Neurosurgery

## 2022-05-16 DIAGNOSIS — C9000 Multiple myeloma not having achieved remission: Secondary | ICD-10-CM

## 2022-05-24 ENCOUNTER — Other Ambulatory Visit: Payer: Self-pay | Admitting: Oncology

## 2022-05-28 ENCOUNTER — Other Ambulatory Visit: Payer: Self-pay | Admitting: Nurse Practitioner

## 2022-05-28 DIAGNOSIS — C9000 Multiple myeloma not having achieved remission: Secondary | ICD-10-CM

## 2022-05-30 ENCOUNTER — Other Ambulatory Visit: Payer: Self-pay | Admitting: Nurse Practitioner

## 2022-05-30 ENCOUNTER — Encounter: Payer: Self-pay | Admitting: Oncology

## 2022-05-30 DIAGNOSIS — C9000 Multiple myeloma not having achieved remission: Secondary | ICD-10-CM

## 2022-05-30 MED ORDER — OXYCODONE HCL 5 MG PO TABS: 5.0000 mg | ORAL_TABLET | Freq: Four times a day (QID) | ORAL | 0 refills | Status: DC | PRN

## 2022-05-30 MED ORDER — DIAZEPAM 5 MG PO TABS: 5.0000 mg | ORAL_TABLET | Freq: Two times a day (BID) | ORAL | 0 refills | Status: DC | PRN

## 2022-06-01 ENCOUNTER — Other Ambulatory Visit: Payer: 59

## 2022-06-01 ENCOUNTER — Inpatient Hospital Stay: Payer: 59

## 2022-06-01 DIAGNOSIS — C9001 Multiple myeloma in remission: Secondary | ICD-10-CM

## 2022-06-01 DIAGNOSIS — C9000 Multiple myeloma not having achieved remission: Secondary | ICD-10-CM | POA: Diagnosis not present

## 2022-06-01 LAB — CBC WITH DIFFERENTIAL (CANCER CENTER ONLY)
Abs Immature Granulocytes: 0.01 10*3/uL (ref 0.00–0.07)
Basophils Absolute: 0.1 10*3/uL (ref 0.0–0.1)
Basophils Relative: 2 %
Eosinophils Absolute: 0.1 10*3/uL (ref 0.0–0.5)
Eosinophils Relative: 3 %
HCT: 39.8 % (ref 36.0–46.0)
Hemoglobin: 13.4 g/dL (ref 12.0–15.0)
Immature Granulocytes: 0 %
Lymphocytes Relative: 26 %
Lymphs Abs: 0.8 10*3/uL (ref 0.7–4.0)
MCH: 31.5 pg (ref 26.0–34.0)
MCHC: 33.7 g/dL (ref 30.0–36.0)
MCV: 93.6 fL (ref 80.0–100.0)
Monocytes Absolute: 0.4 10*3/uL (ref 0.1–1.0)
Monocytes Relative: 14 %
Neutro Abs: 1.8 10*3/uL (ref 1.7–7.7)
Neutrophils Relative %: 55 %
Platelet Count: 218 10*3/uL (ref 150–400)
RBC: 4.25 MIL/uL (ref 3.87–5.11)
RDW: 13.2 % (ref 11.5–15.5)
WBC Count: 3.2 10*3/uL — ABNORMAL LOW (ref 4.0–10.5)
nRBC: 0 % (ref 0.0–0.2)

## 2022-06-02 ENCOUNTER — Encounter: Payer: Self-pay | Admitting: Oncology

## 2022-06-09 ENCOUNTER — Ambulatory Visit (INDEPENDENT_AMBULATORY_CARE_PROVIDER_SITE_OTHER): Payer: 59 | Admitting: Family Medicine

## 2022-06-09 ENCOUNTER — Encounter: Payer: Self-pay | Admitting: Oncology

## 2022-06-09 ENCOUNTER — Encounter (INDEPENDENT_AMBULATORY_CARE_PROVIDER_SITE_OTHER): Payer: Self-pay | Admitting: Family Medicine

## 2022-06-09 VITALS — BP 129/85 | HR 67 | Temp 97.9°F | Ht 62.0 in | Wt 167.0 lb

## 2022-06-09 DIAGNOSIS — Z0289 Encounter for other administrative examinations: Secondary | ICD-10-CM

## 2022-06-09 DIAGNOSIS — E668 Other obesity: Secondary | ICD-10-CM | POA: Diagnosis not present

## 2022-06-09 DIAGNOSIS — Z6830 Body mass index (BMI) 30.0-30.9, adult: Secondary | ICD-10-CM

## 2022-06-09 DIAGNOSIS — E669 Obesity, unspecified: Secondary | ICD-10-CM

## 2022-06-09 DIAGNOSIS — E65 Localized adiposity: Secondary | ICD-10-CM | POA: Insufficient documentation

## 2022-06-16 NOTE — Progress Notes (Signed)
Office: [phone]  /  Fax: [phone]   Initial Visit  Katherine Beard was seen in clinic today to evaluate for obesity. She is interested in losing weight to improve overall health and reduce the risk of weight related complications. She presents today to review program treatment options, initial physical assessment, and evaluation.     She was referred by: Self-Referral  When asked what else they would like to accomplish? She states: Lose a target amount of weight : 17 lbs. back hurts to move with weight gain, vertebral fractures or multiple myeloma.  When asked how has your weight affected you? She states: Contributed to medical problems, Contributed to orthopedic problems or mobility issues, and Having poor endurance  Some associated conditions: Other: Patient has back fractures, she used to run and cycle.  Contributing factors: Other: Patient has back fractures, she used to run and cycle.  Weight promoting medications identified: Other: N/A  Current nutrition plan: None  Current level of physical activity: None and Other: Water exercise 2-3 times per week.  Current or previous pharmacotherapy: Phentermine in the past.  Response to medication: Lost weight and was able to maintain weight loss  Past medical history includes:   Past Medical History:  Diagnosis Date   Acne    DIVERTICULITIS, HX OF    Epicondylitis syndrome of elbow    RIGHT    GERD (gastroesophageal reflux disease)    Hepatic cyst 2015   per MRI   HYPERGLYCEMIA    HYPERLIPIDEMIA    Hyperplastic colon polyp    Iliotibial band syndrome of right side    Liver cyst 2014   Melanoma ([place]) 2014   Excised -clear margins   Multiple myeloma (HCC)    OBESITY    Palpitations    Vitamin D deficiency    Objective:   BP 129/85   Pulse 67   Temp 97.9 F (36.6 C)   Ht _0  (1.575 m)   Wt 167 lb (75.8 kg)   LMP 03/06/2011   SpO2 97%   BMI 30.54 kg/m  She was weighed on the bioimpedance scale: Body  mass index is 30.54 kg/m.  Visceral Fat Rating:10, Body Fat%:37.3  General:  Alert, oriented and cooperative. Patient is in no acute distress.  Respiratory: Normal respiratory effort, no problems with respiration noted  Extremities: Normal range of motion.    Mental Status: Normal mood and affect. Normal behavior. Normal judgment and thought content.   Assessment and Plan:  1. Localized adiposity of abdomen Abdominal weight amplified by multiple vertebral fractures from multiple myeloma.  Patient hopes to see a reduction in back pain with abdominal fat loss.  Body fat elevated at 37.3% with a goal of 30%.  Will watch for body fat and improvement in back pain with medically supervised weight management.  2. Obesity,current BMI 30.7 1) Reviewed today's Bioimpedance results.  2) Reviewed current goals, and program information.  We reviewed weight, biometrics, associated medical conditions and contributing factors with patient. She would benefit from weight loss therapy via a modified calorie, low-carb, high-protein nutritional plan tailored to their REE (resting energy expenditure) which will be determined by indirect calorimetry.  We will also assess for cardiometabolic risk and nutritional derangements via fasting serologies at her next appointment.     Obesity Treatment / Action Plan:  Will complete provided nutritional and psychosocial assessment questionnaire before the next appointment. Will be scheduled for indirect calorimetry to determine resting energy expenditure in a fasting state.  This will  allow [place] to create a reduced calorie, high-protein meal plan to promote loss of fat mass while preserving muscle mass. Will avoid skipping meals which may result in increased hunger signals and overeating at certain times. Was counseled on pharmacotherapy and role as an adjunct in weight management.   Obesity Education Performed Today:  She was weighed on the bioimpedance scale and results  were discussed and documented in the synopsis.  We discussed obesity as a disease and the importance of a more detailed evaluation of all the factors contributing to the disease.  We discussed the importance of long term lifestyle changes which include nutrition, exercise and behavioral modifications as well as the importance of customizing this to her specific health and social needs.  We discussed the benefits of reaching a healthier weight to alleviate the symptoms of existing conditions and reduce the risks of the biomechanical, metabolic and psychological effects of obesity.  MIRIAH MARUYAMA appears to be in the action stage of change and states they are ready to start intensive lifestyle modifications and behavioral modifications.  30 minutes was spent today on this visit including the above counseling, pre-visit chart review, and post-visit documentation.  Reviewed by clinician on day of visit: allergies, medications, problem list, medical history, surgical history, family history, social history, and previous encounter notes.  I, Davy Pique, am acting as Location manager for Loyal Gambler, DO.  I have reviewed the above documentation for accuracy and completeness, and I agree with the above. Dell Ponto, DO

## 2022-06-20 ENCOUNTER — Other Ambulatory Visit: Payer: Self-pay | Admitting: Oncology

## 2022-06-21 ENCOUNTER — Other Ambulatory Visit: Payer: Self-pay | Admitting: Nurse Practitioner

## 2022-06-21 ENCOUNTER — Encounter: Payer: Self-pay | Admitting: *Deleted

## 2022-06-21 ENCOUNTER — Other Ambulatory Visit: Payer: Self-pay | Admitting: Oncology

## 2022-06-21 DIAGNOSIS — C9000 Multiple myeloma not having achieved remission: Secondary | ICD-10-CM

## 2022-06-21 MED ORDER — OXYCODONE HCL 5 MG PO TABS: 5.0000 mg | ORAL_TABLET | Freq: Four times a day (QID) | ORAL | 0 refills | Status: DC | PRN

## 2022-06-21 NOTE — Telephone Encounter (Signed)
Revlimid already taken care of.

## 2022-06-24 ENCOUNTER — Encounter: Payer: Self-pay | Admitting: Oncology

## 2022-06-28 ENCOUNTER — Inpatient Hospital Stay: Payer: 59 | Admitting: Oncology

## 2022-06-28 ENCOUNTER — Other Ambulatory Visit: Payer: Self-pay | Admitting: *Deleted

## 2022-06-28 ENCOUNTER — Inpatient Hospital Stay: Payer: 59 | Attending: Oncology

## 2022-06-28 VITALS — BP 113/81 | HR 65 | Temp 98.2°F | Resp 18 | Ht 62.0 in | Wt 178.0 lb

## 2022-06-28 DIAGNOSIS — Z85820 Personal history of malignant melanoma of skin: Secondary | ICD-10-CM | POA: Diagnosis not present

## 2022-06-28 DIAGNOSIS — C9000 Multiple myeloma not having achieved remission: Secondary | ICD-10-CM | POA: Insufficient documentation

## 2022-06-28 DIAGNOSIS — G8929 Other chronic pain: Secondary | ICD-10-CM | POA: Diagnosis not present

## 2022-06-28 DIAGNOSIS — F32A Depression, unspecified: Secondary | ICD-10-CM | POA: Diagnosis not present

## 2022-06-28 DIAGNOSIS — C9001 Multiple myeloma in remission: Secondary | ICD-10-CM

## 2022-06-28 DIAGNOSIS — M8008XA Age-related osteoporosis with current pathological fracture, vertebra(e), initial encounter for fracture: Secondary | ICD-10-CM | POA: Diagnosis not present

## 2022-06-28 LAB — CBC WITH DIFFERENTIAL (CANCER CENTER ONLY)
Abs Immature Granulocytes: 0 10*3/uL (ref 0.00–0.07)
Basophils Absolute: 0.1 10*3/uL (ref 0.0–0.1)
Basophils Relative: 2 %
Eosinophils Absolute: 0.1 10*3/uL (ref 0.0–0.5)
Eosinophils Relative: 4 %
HCT: 39.3 % (ref 36.0–46.0)
Hemoglobin: 13.6 g/dL (ref 12.0–15.0)
Immature Granulocytes: 0 %
Lymphocytes Relative: 33 %
Lymphs Abs: 0.9 10*3/uL (ref 0.7–4.0)
MCH: 31.6 pg (ref 26.0–34.0)
MCHC: 34.6 g/dL (ref 30.0–36.0)
MCV: 91.2 fL (ref 80.0–100.0)
Monocytes Absolute: 0.4 10*3/uL (ref 0.1–1.0)
Monocytes Relative: 13 %
Neutro Abs: 1.4 10*3/uL — ABNORMAL LOW (ref 1.7–7.7)
Neutrophils Relative %: 48 %
Platelet Count: 227 10*3/uL (ref 150–400)
RBC: 4.31 MIL/uL (ref 3.87–5.11)
RDW: 12.8 % (ref 11.5–15.5)
WBC Count: 2.8 10*3/uL — ABNORMAL LOW (ref 4.0–10.5)
nRBC: 0 % (ref 0.0–0.2)

## 2022-06-28 LAB — CMP (CANCER CENTER ONLY)
ALT: 21 U/L (ref 0–44)
AST: 20 U/L (ref 15–41)
Albumin: 4.2 g/dL (ref 3.5–5.0)
Alkaline Phosphatase: 34 U/L — ABNORMAL LOW (ref 38–126)
Anion gap: 8 (ref 5–15)
BUN: 15 mg/dL (ref 6–20)
CO2: 26 mmol/L (ref 22–32)
Calcium: 8.9 mg/dL (ref 8.9–10.3)
Chloride: 105 mmol/L (ref 98–111)
Creatinine: 0.86 mg/dL (ref 0.44–1.00)
GFR, Estimated: >60 mL/min (ref >60)
Glucose, Bld: 101 mg/dL — ABNORMAL HIGH (ref 70–99)
Potassium: 4 mmol/L (ref 3.5–5.1)
Sodium: 139 mmol/L (ref 135–145)
Total Bilirubin: 0.4 mg/dL (ref 0.3–1.2)
Total Protein: 6.3 g/dL — ABNORMAL LOW (ref 6.5–8.1)

## 2022-06-28 MED ORDER — METHOCARBAMOL 500 MG PO TABS: ORAL_TABLET | ORAL | 1 refills | Status: DC

## 2022-06-28 MED ORDER — ESCITALOPRAM OXALATE 5 MG PO TABS: 5.0000 mg | ORAL_TABLET | Freq: Every day | ORAL | 5 refills | Status: DC

## 2022-06-28 NOTE — Progress Notes (Signed)
[place] OFFICE PROGRESS NOTE   Diagnosis: Multiple myeloma  INTERVAL HISTORY:   Katherine Beard returns as scheduled.  She feels well.  She is scheduled to begin another cycle of Revlimid this week.  She reports loose stool for a few days at the beginning of each cycle of Revlimid.  She has chronic back pain.  She continues to exercise.  She is scheduled for an appointment in the pain clinic at [facility] next month.  Objective:  Vital signs in last 24 hours:  Blood pressure 113/81, pulse 65, temperature 98.2 F (36.8 C), temperature source Oral, resp. rate 18, height 5' 2" (1.575 m), weight 178 lb (80.7 kg), last menstrual period 03/06/2011, SpO2 99 %.    HEENT: No thrush or ulcers Resp: Lungs clear bilaterally Cardio: Regular rate and rhythm GI: No hepatosplenomegaly Vascular: No leg edema    Lab Results:  Lab Results  Component Value Date   WBC 2.8 (L) 06/28/2022   HGB 13.6 06/28/2022   HCT 39.3 06/28/2022   MCV 91.2 06/28/2022   PLT 227 06/28/2022   NEUTROABS 1.4 (L) 06/28/2022    CMP  Lab Results  Component Value Date   NA 139 05/02/2022   K 4.1 05/02/2022   CL 104 05/02/2022   CO2 25 05/02/2022   GLUCOSE 105 (H) 05/02/2022   BUN 24 (H) 05/02/2022   CREATININE 0.92 05/02/2022   CALCIUM 9.0 05/02/2022   PROT 6.6 04/11/2022   ALBUMIN 4.3 04/11/2022   AST 23 04/11/2022   ALT 27 04/11/2022   ALKPHOS 39 04/11/2022   BILITOT 0.5 04/11/2022   GFRNONAA >60 05/02/2022   GFRAA 78 12/11/2006     Medications: I have reviewed the patient's current medications.   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 Increase serum free lambda light chains Serum M spike MRI pelvis 09/01/2020-innumerable small foci of signal abnormality concerning for multiple myeloma involvement Metastatic bone  survey 09/03/2020-no discrete lytic bone lesions, compression fractures at T12, L1, L2, and L3, osteopenia Cycle 1 RVD 09/14/2020 Daratumumab beginning 09/28/2020 Cycle 2 RVD 10/05/2020 plus daratumumab Week 3 daratumumab 10/12/2020 Normal lambda light chains 10/26/2020 Cycle 3 RVD plus daratumumab 10/26/2020 Cycle 4 RVD plus daratumumab 11/16/2020, daratumumab changed to an every 2-week schedule beginning 11/23/2020 Cycle 5 RVD plus daratumumab 12/07/2020 Cycle 6 RVD plus daratumumab 12/28/2020 Normal lambda light chains 01/05/2021 Revlimid maintenance 21/28 days beginning 01/18/2021 Stem cell harvest at [facility] 03/29/2021 Revlimid resumed 03/30/2021   2. Severe back pain MRI lumbar spine July 12, 2020-subacute compression fractures at T12 and L1 MRI lumbar spine July 30, 2020-subacute T12 and L1 compression fractures, new subacute superior L2 endplate deformity MRI lumbar spine September 01, 2020 new/progressive compression fractures at L2 and L3, acute compression fracture at L4, evolution of T12 and L1 compression fractures MRI pelvis September 01, 2020-innumerable small foci of signal abnormality in the pelvis and femurs, no fracture identified, concerning for multiple myeloma versus metastases Zometa beginning 09/21/2020-plan monthly x3 then every 3 months, next due 11/19/2021 MRI thoracic spine July 2022-new T3 compression fracture with multiple additional thoracic compression fractures 3. Melanoma left upper back 2014 4. History of diverticulitis 5. Mild hypercalcemia at presentation-resolved 6. Osteoporosis 7. Evusheld 8.  Depression-worsened depression symptoms 01/11/2021, referred to psychology      Disposition: Katherine Beard appears stable.  We will follow-up on the myeloma panel from  today.  She has mild neutropenia.  She will call for a fever or symptoms of infection.  She will begin another cycle of Revlimid this week.  She will return for an office and lab visit during the week of 07/25/2021.  She will  be seen at the [facility] pain clinic next month.  She will be due for Zometa in February.  I refilled her prescription for methocarbamol.  [name], MD  06/28/2022  11:28 AM

## 2022-06-29 ENCOUNTER — Ambulatory Visit: Payer: 59 | Admitting: Oncology

## 2022-06-29 ENCOUNTER — Other Ambulatory Visit: Payer: 59

## 2022-06-29 LAB — KAPPA/LAMBDA LIGHT CHAINS
Kappa free light chain: 10.3 mg/L (ref 3.3–19.4)
Kappa, lambda light chain ratio: 1.47 (ref 0.26–1.65)
Lambda free light chains: 7 mg/L (ref 5.7–26.3)

## 2022-07-01 ENCOUNTER — Inpatient Hospital Stay: Payer: 59

## 2022-07-01 ENCOUNTER — Inpatient Hospital Stay: Payer: 59 | Admitting: Oncology

## 2022-07-04 ENCOUNTER — Ambulatory Visit (HOSPITAL_COMMUNITY): Payer: 59

## 2022-07-05 ENCOUNTER — Encounter: Payer: Self-pay | Admitting: Oncology

## 2022-07-06 LAB — MULTIPLE MYELOMA PANEL, SERUM
Albumin SerPl Elph-Mcnc: 3.6 g/dL (ref 2.9–4.4)
Albumin/Glob SerPl: 1.8 — ABNORMAL HIGH (ref 0.7–1.7)
Alpha 1: 0.2 g/dL (ref 0.0–0.4)
Alpha2 Glob SerPl Elph-Mcnc: 0.6 g/dL (ref 0.4–1.0)
B-Globulin SerPl Elph-Mcnc: 0.7 g/dL (ref 0.7–1.3)
Gamma Glob SerPl Elph-Mcnc: 0.6 g/dL (ref 0.4–1.8)
Globulin, Total: 2.1 g/dL — ABNORMAL LOW (ref 2.2–3.9)
IgA: 23 mg/dL — ABNORMAL LOW (ref 87–352)
IgG (Immunoglobin G), Serum: 576 mg/dL — ABNORMAL LOW (ref 586–1602)
IgM (Immunoglobulin M), Srm: 15 mg/dL — ABNORMAL LOW (ref 26–217)
Total Protein ELP: 5.7 g/dL — ABNORMAL LOW (ref 6.0–8.5)

## 2022-07-13 ENCOUNTER — Ambulatory Visit (INDEPENDENT_AMBULATORY_CARE_PROVIDER_SITE_OTHER): Payer: 59 | Admitting: Family Medicine

## 2022-07-13 ENCOUNTER — Encounter (INDEPENDENT_AMBULATORY_CARE_PROVIDER_SITE_OTHER): Payer: Self-pay | Admitting: Family Medicine

## 2022-07-13 VITALS — BP 132/85 | HR 66 | Temp 98.0°F | Ht 62.0 in | Wt 172.0 lb

## 2022-07-13 DIAGNOSIS — R5383 Other fatigue: Secondary | ICD-10-CM | POA: Insufficient documentation

## 2022-07-13 DIAGNOSIS — Z6831 Body mass index (BMI) 31.0-31.9, adult: Secondary | ICD-10-CM

## 2022-07-13 DIAGNOSIS — C9000 Multiple myeloma not having achieved remission: Secondary | ICD-10-CM | POA: Diagnosis not present

## 2022-07-13 DIAGNOSIS — E669 Obesity, unspecified: Secondary | ICD-10-CM

## 2022-07-13 DIAGNOSIS — M549 Dorsalgia, unspecified: Secondary | ICD-10-CM | POA: Diagnosis not present

## 2022-07-13 DIAGNOSIS — Z1331 Encounter for screening for depression: Secondary | ICD-10-CM | POA: Diagnosis not present

## 2022-07-13 DIAGNOSIS — R0602 Shortness of breath: Secondary | ICD-10-CM

## 2022-07-13 DIAGNOSIS — G8929 Other chronic pain: Secondary | ICD-10-CM | POA: Diagnosis not present

## 2022-07-14 ENCOUNTER — Telehealth (INDEPENDENT_AMBULATORY_CARE_PROVIDER_SITE_OTHER): Payer: Self-pay | Admitting: Family Medicine

## 2022-07-14 LAB — LIPID PANEL
Chol/HDL Ratio: 3.6 ratio (ref 0.0–4.4)
Cholesterol, Total: 225 mg/dL — ABNORMAL HIGH (ref 100–199)
HDL: 63 mg/dL (ref >39)
LDL Chol Calc (NIH): 132 mg/dL — ABNORMAL HIGH (ref 0–99)
Triglycerides: 171 mg/dL — ABNORMAL HIGH (ref 0–149)
VLDL Cholesterol Cal: 30 mg/dL (ref 5–40)

## 2022-07-14 LAB — INSULIN, RANDOM: INSULIN: 4.4 u[IU]/mL (ref 2.6–24.9)

## 2022-07-14 LAB — VITAMIN B12: Vitamin B-12: 1907 pg/mL — ABNORMAL HIGH (ref 232–1245)

## 2022-07-14 LAB — HEMOGLOBIN A1C
Est. average glucose Bld gHb Est-mCnc: 108 mg/dL
Hgb A1c MFr Bld: 5.4 % (ref 4.8–5.6)

## 2022-07-14 LAB — T4, FREE: Free T4: 1.23 ng/dL (ref 0.82–1.77)

## 2022-07-14 LAB — TSH: TSH: 1.36 u[IU]/mL (ref 0.450–4.500)

## 2022-07-14 LAB — VITAMIN D 25 HYDROXY (VIT D DEFICIENCY, FRACTURES): Vit D, 25-Hydroxy: 29.6 ng/mL — ABNORMAL LOW (ref 30.0–100.0)

## 2022-07-14 LAB — FOLATE: Folate: >20 ng/mL (ref >3.0)

## 2022-07-14 MED ORDER — LOMAIRA 8 MG PO TABS: ORAL_TABLET | ORAL | 0 refills | Status: DC

## 2022-07-14 NOTE — Telephone Encounter (Signed)
Left patient a message letting her know her medication was sent to the pharmacy today.

## 2022-07-14 NOTE — Telephone Encounter (Signed)
Pt called 1/11 went to the pharmacy to pick up her prescription for Lomaira '8mg'$  they did not have a record of it. [facility] [phone] [place] she would like to pick it up this morning to start her program.

## 2022-07-19 ENCOUNTER — Other Ambulatory Visit: Payer: Self-pay | Admitting: Oncology

## 2022-07-24 ENCOUNTER — Other Ambulatory Visit: Payer: Self-pay | Admitting: Nurse Practitioner

## 2022-07-24 DIAGNOSIS — C9000 Multiple myeloma not having achieved remission: Secondary | ICD-10-CM

## 2022-07-25 ENCOUNTER — Encounter: Payer: Self-pay | Admitting: Oncology

## 2022-07-25 ENCOUNTER — Inpatient Hospital Stay: Payer: 59 | Admitting: Oncology

## 2022-07-25 ENCOUNTER — Inpatient Hospital Stay: Payer: 59 | Attending: Oncology

## 2022-07-25 VITALS — BP 121/73 | HR 51 | Temp 97.7°F | Resp 18 | Ht 62.0 in | Wt 170.6 lb

## 2022-07-25 DIAGNOSIS — D709 Neutropenia, unspecified: Secondary | ICD-10-CM | POA: Diagnosis not present

## 2022-07-25 DIAGNOSIS — M8588 Other specified disorders of bone density and structure, other site: Secondary | ICD-10-CM | POA: Insufficient documentation

## 2022-07-25 DIAGNOSIS — C9000 Multiple myeloma not having achieved remission: Secondary | ICD-10-CM | POA: Diagnosis not present

## 2022-07-25 DIAGNOSIS — C9001 Multiple myeloma in remission: Secondary | ICD-10-CM

## 2022-07-25 DIAGNOSIS — Z85820 Personal history of malignant melanoma of skin: Secondary | ICD-10-CM | POA: Diagnosis not present

## 2022-07-25 DIAGNOSIS — F32A Depression, unspecified: Secondary | ICD-10-CM | POA: Diagnosis not present

## 2022-07-25 LAB — CMP (CANCER CENTER ONLY)
ALT: 19 U/L (ref 0–44)
AST: 19 U/L (ref 15–41)
Albumin: 4.4 g/dL (ref 3.5–5.0)
Alkaline Phosphatase: 28 U/L — ABNORMAL LOW (ref 38–126)
Anion gap: 7 (ref 5–15)
BUN: 16 mg/dL (ref 6–20)
CO2: 27 mmol/L (ref 22–32)
Calcium: 9.4 mg/dL (ref 8.9–10.3)
Chloride: 103 mmol/L (ref 98–111)
Creatinine: 0.9 mg/dL (ref 0.44–1.00)
GFR, Estimated: >60 mL/min (ref >60)
Glucose, Bld: 104 mg/dL — ABNORMAL HIGH (ref 70–99)
Potassium: 4 mmol/L (ref 3.5–5.1)
Sodium: 137 mmol/L (ref 135–145)
Total Bilirubin: 0.5 mg/dL (ref 0.3–1.2)
Total Protein: 6.5 g/dL (ref 6.5–8.1)

## 2022-07-25 LAB — CBC WITH DIFFERENTIAL (CANCER CENTER ONLY)
Abs Immature Granulocytes: 0.01 10*3/uL (ref 0.00–0.07)
Basophils Absolute: 0 10*3/uL (ref 0.0–0.1)
Basophils Relative: 2 %
Eosinophils Absolute: 0.1 10*3/uL (ref 0.0–0.5)
Eosinophils Relative: 4 %
HCT: 41.4 % (ref 36.0–46.0)
Hemoglobin: 13.9 g/dL (ref 12.0–15.0)
Immature Granulocytes: 0 %
Lymphocytes Relative: 42 %
Lymphs Abs: 1 10*3/uL (ref 0.7–4.0)
MCH: 30.8 pg (ref 26.0–34.0)
MCHC: 33.6 g/dL (ref 30.0–36.0)
MCV: 91.6 fL (ref 80.0–100.0)
Monocytes Absolute: 0.3 10*3/uL (ref 0.1–1.0)
Monocytes Relative: 15 %
Neutro Abs: 0.8 10*3/uL — ABNORMAL LOW (ref 1.7–7.7)
Neutrophils Relative %: 37 %
Platelet Count: 257 10*3/uL (ref 150–400)
RBC: 4.52 MIL/uL (ref 3.87–5.11)
RDW: 13.2 % (ref 11.5–15.5)
WBC Count: 2.3 10*3/uL — ABNORMAL LOW (ref 4.0–10.5)
nRBC: 0 % (ref 0.0–0.2)

## 2022-07-25 MED ORDER — OXYCODONE HCL 5 MG PO TABS: 5.0000 mg | ORAL_TABLET | Freq: Four times a day (QID) | ORAL | 0 refills | Status: DC | PRN

## 2022-07-25 MED ORDER — METHOCARBAMOL 500 MG PO TABS: ORAL_TABLET | ORAL | 1 refills | Status: DC

## 2022-07-25 NOTE — Progress Notes (Signed)
[place] OFFICE PROGRESS NOTE   Diagnosis: Multiple myeloma  INTERVAL HISTORY:   Katherine Beard returns as scheduled.  She feels well.  She reports improvement in back pain.  She is exercising.  She has lost weight after starting a wellness program.  She is due to start the next cycle of Revlimid on 07/28/2022.  She reports diarrhea for the first few days of each Revlimid cycle.  Objective:  Vital signs in last 24 hours:  Blood pressure 121/73, pulse (!) 51, temperature 97.7 F (36.5 C), temperature source Oral, resp. rate 18, height '5\' 2"'$  (1.575 m), weight 170 lb 9.6 oz (77.4 kg), last menstrual period 03/06/2011, SpO2 100 %.    HEENT: No thrush or ulcers Resp: Lungs clear bilaterally Cardio: Regular rate and rhythm GI: Nontender, no hepatosplenomegaly Vascular: No leg edema   Lab Results:  Lab Results  Component Value Date   WBC 2.3 (L) 07/25/2022   HGB 13.9 07/25/2022   HCT 41.4 07/25/2022   MCV 91.6 07/25/2022   PLT 257 07/25/2022   NEUTROABS 0.8 (L) 07/25/2022    CMP  Lab Results  Component Value Date   NA 137 07/25/2022   K 4.0 07/25/2022   CL 103 07/25/2022   CO2 27 07/25/2022   GLUCOSE 104 (H) 07/25/2022   BUN 16 07/25/2022   CREATININE 0.90 07/25/2022   CALCIUM 9.4 07/25/2022   PROT 6.5 07/25/2022   ALBUMIN 4.4 07/25/2022   AST 19 07/25/2022   ALT 19 07/25/2022   ALKPHOS 28 (L) 07/25/2022   BILITOT 0.5 07/25/2022   GFRNONAA >60 07/25/2022   GFRAA 78 12/11/2006    No results found for: "CEA1", "CEA", "ZJQ734", "CA125"  Lab Results  Component Value Date   INR 1.0 09/04/2020   LABPROT 13.1 09/04/2020    Imaging:  No results found.  Medications: I have reviewed the patient's current medications.   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 Increase serum  free lambda light chains Serum M spike MRI pelvis 09/01/2020-innumerable small foci of signal abnormality concerning for multiple myeloma involvement Metastatic bone survey 09/03/2020-no discrete lytic bone lesions, compression fractures at T12, L1, L2, and L3, osteopenia Cycle 1 RVD 09/14/2020 Daratumumab beginning 09/28/2020 Cycle 2 RVD 10/05/2020 plus daratumumab Week 3 daratumumab 10/12/2020 Normal lambda light chains 10/26/2020 Cycle 3 RVD plus daratumumab 10/26/2020 Cycle 4 RVD plus daratumumab 11/16/2020, daratumumab changed to an every 2-week schedule beginning 11/23/2020 Cycle 5 RVD plus daratumumab 12/07/2020 Cycle 6 RVD plus daratumumab 12/28/2020 Normal lambda light chains 01/05/2021 Revlimid maintenance 21/28 days beginning 01/18/2021 Stem cell harvest at [facility] 03/29/2021 Revlimid resumed 03/30/2021   2. Severe back pain MRI lumbar spine July 12, 2020-subacute compression fractures at T12 and L1 MRI lumbar spine July 30, 2020-subacute T12 and L1 compression fractures, new subacute superior L2 endplate deformity MRI lumbar spine September 01, 2020 new/progressive compression fractures at L2 and L3, acute compression fracture at L4, evolution of T12 and L1 compression fractures MRI pelvis September 01, 2020-innumerable small foci of signal abnormality in the pelvis and femurs, no fracture identified, concerning for multiple myeloma versus metastases Zometa beginning 09/21/2020-plan monthly x3 then every 3 months, next due 11/19/2021 MRI thoracic spine July 2022-new T3 compression fracture with multiple additional thoracic compression fractures 3. Melanoma left upper back 2014 4. History of diverticulitis 5. Mild hypercalcemia at presentation-resolved 6. Osteoporosis 7. Evusheld  8.  Depression-worsened depression symptoms 01/11/2021, referred to psychology    Disposition: Katherine Beard continues Revlimid maintenance.  She is tolerating the Revlimid well.  She remains in clinical remission from myeloma.   She has moderate neutropenia today.  She will call for a fever or symptoms of infection.  The next cycle of Revlimid will be held until she returns for a CBC next week.  She will be scheduled for an office visit and Zometa on 08/29/2022.  Katherine Coder, MD  07/25/2022  9:20 AM

## 2022-07-27 ENCOUNTER — Ambulatory Visit (INDEPENDENT_AMBULATORY_CARE_PROVIDER_SITE_OTHER): Payer: 59 | Admitting: Family Medicine

## 2022-07-27 ENCOUNTER — Encounter (INDEPENDENT_AMBULATORY_CARE_PROVIDER_SITE_OTHER): Payer: Self-pay | Admitting: Family Medicine

## 2022-07-27 VITALS — BP 131/71 | HR 53 | Temp 97.7°F | Ht 62.0 in

## 2022-07-27 DIAGNOSIS — R632 Polyphagia: Secondary | ICD-10-CM | POA: Diagnosis not present

## 2022-07-27 DIAGNOSIS — E559 Vitamin D deficiency, unspecified: Secondary | ICD-10-CM | POA: Diagnosis not present

## 2022-07-27 DIAGNOSIS — E66811 Obesity, class 1: Secondary | ICD-10-CM

## 2022-07-27 DIAGNOSIS — E669 Obesity, unspecified: Secondary | ICD-10-CM

## 2022-07-27 DIAGNOSIS — C9000 Multiple myeloma not having achieved remission: Secondary | ICD-10-CM | POA: Diagnosis not present

## 2022-07-27 DIAGNOSIS — E678 Other specified hyperalimentation: Secondary | ICD-10-CM

## 2022-07-27 DIAGNOSIS — Z6830 Body mass index (BMI) 30.0-30.9, adult: Secondary | ICD-10-CM

## 2022-07-27 MED ORDER — LOMAIRA 8 MG PO TABS: ORAL_TABLET | ORAL | 0 refills | Status: DC

## 2022-08-03 ENCOUNTER — Inpatient Hospital Stay: Payer: 59

## 2022-08-03 ENCOUNTER — Telehealth: Payer: Self-pay | Admitting: *Deleted

## 2022-08-03 DIAGNOSIS — C9000 Multiple myeloma not having achieved remission: Secondary | ICD-10-CM | POA: Diagnosis not present

## 2022-08-03 LAB — CBC WITH DIFFERENTIAL (CANCER CENTER ONLY)
Abs Immature Granulocytes: 0.01 10*3/uL (ref 0.00–0.07)
Basophils Absolute: 0 10*3/uL (ref 0.0–0.1)
Basophils Relative: 1 %
Eosinophils Absolute: 0 10*3/uL (ref 0.0–0.5)
Eosinophils Relative: 1 %
HCT: 43.9 % (ref 36.0–46.0)
Hemoglobin: 15.1 g/dL — ABNORMAL HIGH (ref 12.0–15.0)
Immature Granulocytes: 0 %
Lymphocytes Relative: 28 %
Lymphs Abs: 1.1 10*3/uL (ref 0.7–4.0)
MCH: 31.3 pg (ref 26.0–34.0)
MCHC: 34.4 g/dL (ref 30.0–36.0)
MCV: 91.1 fL (ref 80.0–100.0)
Monocytes Absolute: 0.5 10*3/uL (ref 0.1–1.0)
Monocytes Relative: 13 %
Neutro Abs: 2.2 10*3/uL (ref 1.7–7.7)
Neutrophils Relative %: 57 %
Platelet Count: 229 10*3/uL (ref 150–400)
RBC: 4.82 MIL/uL (ref 3.87–5.11)
RDW: 13.2 % (ref 11.5–15.5)
WBC Count: 3.8 10*3/uL — ABNORMAL LOW (ref 4.0–10.5)
nRBC: 0 % (ref 0.0–0.2)

## 2022-08-03 NOTE — Telephone Encounter (Signed)
Call to patient with improved wbc at 3.8. Per Ned Card, NP: resume revlimid tonight

## 2022-08-08 NOTE — Progress Notes (Unsigned)
Chief Complaint:   OBESITY Katherine Beard (MR# [id number]) is a 62 y.o. female who presents for evaluation and treatment of obesity and related comorbidities. Current BMI is Body mass index is 31.46 kg/m. Katherine Beard has been struggling with her weight for many years and has been unsuccessful in either losing weight, maintaining weight loss, or reaching her healthy weight goal.  Charleene works from home, a sedentary job.  She rarely eats out.  She lives with her husband who is supportive.  She has reduced ETOH intake.  Chooses salty snacks and has been stress eating.  She tends to snack in the afternoons.    Katherine Beard is currently in the action stage of change and ready to dedicate time achieving and maintaining a healthier weight. Katherine Beard is interested in becoming our patient and working on intensive lifestyle modifications including (but not limited to) diet and exercise for weight loss.  Katherine Beard's habits were reviewed today and are as follows: Her family eats meals together, she thinks her family will eat healthier with her, her desired weight loss is 22 lbs, she has been heavy most of her life, she started gaining weight during menopause, her heaviest weight ever was 172 pounds, she has significant food cravings issues, she is frequently drinking liquids with calories, she frequently eats larger portions than normal, and she struggles with emotional eating.  Depression Screen [name]'s Food and Mood (modified PHQ-9) score was 7.  Subjective:   1. Other fatigue Katieann denies daytime somnolence and denies waking up still tired. Patient has a history of symptoms of N/A. Knox generally gets 6 or 7 hours of sleep per night, and states that she has generally restful sleep. Snoring is not present. Apneic episodes are not present. Epworth Sleepiness Score is 6. Patient is currently on treatment for multiple myeloma.  EKG:  Sinus brady, 49 BPM-has previously had normal workup..   2. SOBOE (shortness of breath on  exertion) [name] notes increasing shortness of breath with exercising and seems to be worsening over time with weight gain. She notes getting out of breath sooner with activity than she used to. This has not gotten worse recently. [name] denies shortness of breath at rest or orthopnea.  3. Chronic back pain, unspecified back location, unspecified back pain laterality Due to multiple vertebral fractures.  She is able to do water exercise and walking.  She is seeing oncology and a spine specialist.   4. Multiple myeloma, remission status unspecified ([place]) Vertebral fractures have limited her exercise, contributed to weight gain, she is seeing Dr Katherine Beard. She is on lenalidomide 10 mg 21 days on and 7 days off.   Assessment/Plan:   1. Other fatigue Katherine Beard does feel that her weight is causing her energy to be lower than it should be. Fatigue may be related to obesity, depression or many other causes. Labs will be ordered, and in the meanwhile, Katherine Beard will focus on self care including making healthy food choices, increasing physical activity and focusing on stress reduction. Update labs today.   - EKG 12-Lead - VITAMIN D 25 Hydroxy (Vit-D Deficiency, Fractures) - TSH - T4, free - Lipid panel - Insulin, random - Hemoglobin A1c - Folate - Vitamin B12  2. SOBOE (shortness of breath on exertion) Katherine Beard does feel that she gets out of breath more easily that she used to when she exercises. Katherine Beard's shortness of breath appears to be obesity related and exercise induced. She has agreed to work on weight loss and gradually increase  exercise to treat her exercise induced shortness of breath. Will continue to monitor closely.  3. Chronic back pain, unspecified back location, unspecified back pain laterality Begin active plan for weight reduction to help ease back pain.  Continue oxycodone, Voltaren as directed.   4. Multiple myeloma, remission status unspecified ([place]) Begin active plan for weight reduction  to ease back pain.  Look for an increase in muscle mass.   5. Depression screen Katherine Beard had a positive depression screening. Depression is commonly associated with obesity and often results in emotional eating behaviors. We will monitor this closely and work on CBT to help improve the non-hunger eating patterns. Referral to Psychology may be required if no improvement is seen as she continues in our clinic.  6. Obesity,current BMI 31.6 Begin Lomaira 8 mg 1 tablet 30 minutes before breakfast and 1 tablet at 2 pm daily.  PDMP reviewed and informed consent signed.   Deloris is currently in the action stage of change and her goal is to continue with weight loss efforts. I recommend [name] begin the structured treatment plan as follows:  She has agreed to the Category 1 Plan with 80 gram protein daily.  Exercise goals:  As is.     Behavioral modification strategies: increasing lean protein intake, increasing vegetables, increasing water intake, decreasing eating out, no skipping meals, meal planning and cooking strategies, keeping healthy foods in the home, ways to avoid boredom eating, and planning for success.  She was informed of the importance of frequent follow-up visits to maximize her success with intensive lifestyle modifications for her multiple health conditions. She was informed we would discuss her lab results at her next visit unless there is a critical issue that needs to be addressed sooner. Katherine Beard agreed to keep her next visit at the agreed upon time to discuss these results.  Objective:   Blood pressure 132/85, pulse 66, temperature 98 F (36.7 C), height '5\' 2"'$  (1.575 m), weight 172 lb (78 kg), last menstrual period 03/06/2011, SpO2 99 %. Body mass index is 31.46 kg/m.  EKG: Normal sinus rhythm, rate 49 BPM. .  Indirect Calorimeter completed today shows a VO2 of 195 and a REE of 1339.  Her calculated basal metabolic rate is 8144 thus her basal metabolic rate is better than  expected.  General: Cooperative, alert, well developed, in no acute distress. HEENT: Conjunctivae and lids unremarkable. Cardiovascular: Regular rhythm.  Lungs: Normal work of breathing. Neurologic: No focal deficits.   Lab Results  Component Value Date   CREATININE 0.90 07/25/2022   BUN 16 07/25/2022   NA 137 07/25/2022   K 4.0 07/25/2022   CL 103 07/25/2022   CO2 27 07/25/2022   Lab Results  Component Value Date   ALT 19 07/25/2022   AST 19 07/25/2022   ALKPHOS 28 (L) 07/25/2022   BILITOT 0.5 07/25/2022   Lab Results  Component Value Date   HGBA1C 5.4 07/13/2022   HGBA1C 5.6 12/11/2006   Lab Results  Component Value Date   INSULIN 4.4 07/13/2022   Lab Results  Component Value Date   TSH 1.360 07/13/2022   Lab Results  Component Value Date   CHOL 225 (H) 07/13/2022   HDL 63 07/13/2022   LDLCALC 132 (H) 07/13/2022   LDLDIRECT 173.7 11/26/2010   TRIG 171 (H) 07/13/2022   CHOLHDL 3.6 07/13/2022   Lab Results  Component Value Date   WBC 3.8 (L) 08/03/2022   HGB 15.1 (H) 08/03/2022   HCT 43.9 08/03/2022  MCV 91.1 08/03/2022   PLT 229 08/03/2022   Lab Results  Component Value Date   IRON 65 07/08/2020   Attestation Statements:   Reviewed by clinician on day of visit: allergies, medications, problem list, medical history, surgical history, family history, social history, and previous encounter notes.  I have personally spent 40 minutes total time today in preparation, patient care, counseling and documentation for this visit, including the following: review of clinical lab tests; review of medical tests/procedures/services.    I, Davy Pique, am acting as Location manager for Loyal Gambler, DO.  I have reviewed the above documentation for accuracy and completeness, and I agree with the above. Dell Ponto, DO

## 2022-08-13 NOTE — Progress Notes (Signed)
Chief Complaint:   OBESITY Katherine Beard is here to discuss her progress with her obesity treatment plan along with follow-up of her obesity related diagnoses. Katherine Beard is on keeping a food journal and adhering to recommended goals of 1200 calories and 85 grams protein and states she is following her eating plan approximately 95% of the time. Katherine Beard states she is swimming and walking 30 minutes 2-4 times per week.  Today's visit was #: 2 Starting weight: 172 lbs Starting date: 07/13/2022 Today's weight: 166 lbs Today's date: 07/27/2022 Total lbs lost to date: 6 Total lbs lost since last in-office visit: 6  Interim History: Katherine Beard has cut back on alcohol intake. Picking a [employer] meat stick, cheese, and greek yogurt. Pt started Lomaira 8 mg tab twice a day, and notes it is helping with appetite control. She denies adverse side effects. Pt lost 3 lbs of muscle mass and lost 3.6 lbs of body fat.  Subjective:   1. Polyphagia Symptoms are improving with eating scheduled protein and fiber with meals and ise of Lomaira 8 mg twice a day. Heart rate and BP are within normal limits. Pt has lost 6 lbs in 2 weeks since starting Lomaira.  2. Vitamin D deficiency Discussed labs with patient today. 07/13/2022 Vitamin D level 29.6. Katherine Beard is taking OTC Vitamin D 1,000 IU daily and a multivitamin daily.  3. Excessive vitamin B12 intake Discussed labs with patient today. B12 level high at 1907. Katherine Beard was taking a multivitamin daily and a B12 supplement (unknown dose). She denies GI upset.  4. Multiple myeloma, remission status unspecified ([facility]) Katherine Beard is on Revlimid per Dr. Benay Spice. She has sustained multiple vertebral fractures and now has chronic back pain. She is hoping to improve pain, breathing and exercise capacity with weight loss.  Assessment/Plan:   1. Polyphagia Continue Lomaira 8 mg BID.  Refill- Phentermine HCl (LOMAIRA) 8 MG TABS; 1 tab po 30 min before breakfast, 1 tab po at 2 pm daily   Dispense: 60 tablet; Refill: 0  2. Vitamin D deficiency Increase OTC Vitamin D3 to 2,000 IU daily, with a goal level of 50-70.  3. Excessive vitamin B12 intake Discontinue B12 supplement. Okay to continue multivitamin daily.  4. Multiple myeloma, remission status unspecified (H[facility]) Continue prescribed diet and exercise as tolerated.  5. Obesity,current BMI 30.4 Katherine Beard is currently in the action stage of change. As such, her goal is to continue with weight loss efforts. She has agreed to keeping a food journal and adhering to recommended goals of 1200 calories and 85 grams protein.   Add one additional high protein snack (15-25 grams of protein).   Exercise goals:  Starting with personal trainer at [facility]- Adding supervised weight training 1-2 times a week due to current multiple myeloma.  Behavioral modification strategies: increasing lean protein intake, increasing vegetables, increasing water intake, decreasing alcohol intake, meal planning and cooking strategies, better snacking choices, and planning for success.  Katherine Beard has agreed to follow-up with our clinic in 3-4 weeks. She was informed of the importance of frequent follow-up visits to maximize her success with intensive lifestyle modifications for her multiple health conditions.   Objective:   Blood pressure 131/71, pulse (!) 53, temperature 97.7 F (36.5 C), height 5' 2"$  (1.575 m), last menstrual period 03/06/2011, SpO2 97 %. Body mass index is 31.2 kg/m.  General: Cooperative, alert, well developed, in no acute distress. HEENT: Conjunctivae and lids unremarkable. Cardiovascular: Regular rhythm.  Lungs: Normal work of breathing. Neurologic: No focal  deficits.   Lab Results  Component Value Date   CREATININE 0.90 07/25/2022   BUN 16 07/25/2022   NA 137 07/25/2022   K 4.0 07/25/2022   CL 103 07/25/2022   CO2 27 07/25/2022   Lab Results  Component Value Date   ALT 19 07/25/2022   AST 19 07/25/2022   ALKPHOS 28 (L)  07/25/2022   BILITOT 0.5 07/25/2022   Lab Results  Component Value Date   HGBA1C 5.4 07/13/2022   HGBA1C 5.6 12/11/2006   Lab Results  Component Value Date   INSULIN 4.4 07/13/2022   Lab Results  Component Value Date   TSH 1.360 07/13/2022   Lab Results  Component Value Date   CHOL 225 (H) 07/13/2022   HDL 63 07/13/2022   LDLCALC 132 (H) 07/13/2022   LDLDIRECT 173.7 11/26/2010   TRIG 171 (H) 07/13/2022   CHOLHDL 3.6 07/13/2022   Lab Results  Component Value Date   VD25OH 29.6 (L) 07/13/2022   VD25OH 69.83 07/08/2020   VD25OH 53.47 09/12/2019   Lab Results  Component Value Date   WBC 3.8 (L) 08/03/2022   HGB 15.1 (H) 08/03/2022   HCT 43.9 08/03/2022   MCV 91.1 08/03/2022   PLT 229 08/03/2022   Lab Results  Component Value Date   IRON 65 07/08/2020   Attestation Statements:   Reviewed by clinician on day of visit: allergies, medications, problem list, medical history, surgical history, family history, social history, and previous encounter notes.  I, Kathlene November, BS, CMA, am acting as transcriptionist for Loyal Gambler, DO.   I have reviewed the above documentation for accuracy and completeness, and I agree with the above. Dell Ponto, DO

## 2022-08-15 ENCOUNTER — Other Ambulatory Visit (INDEPENDENT_AMBULATORY_CARE_PROVIDER_SITE_OTHER): Payer: Self-pay | Admitting: Family Medicine

## 2022-08-15 DIAGNOSIS — R632 Polyphagia: Secondary | ICD-10-CM

## 2022-08-18 ENCOUNTER — Other Ambulatory Visit: Payer: Self-pay | Admitting: Oncology

## 2022-08-18 ENCOUNTER — Other Ambulatory Visit: Payer: Self-pay | Admitting: Nurse Practitioner

## 2022-08-18 ENCOUNTER — Encounter: Payer: Self-pay | Admitting: *Deleted

## 2022-08-18 DIAGNOSIS — C9000 Multiple myeloma not having achieved remission: Secondary | ICD-10-CM

## 2022-08-18 MED ORDER — OXYCODONE HCL 5 MG PO TABS: 5.0000 mg | ORAL_TABLET | Freq: Four times a day (QID) | ORAL | 0 refills | Status: DC | PRN

## 2022-08-19 ENCOUNTER — Other Ambulatory Visit: Payer: Self-pay | Admitting: *Deleted

## 2022-08-22 ENCOUNTER — Encounter (INDEPENDENT_AMBULATORY_CARE_PROVIDER_SITE_OTHER): Payer: Self-pay | Admitting: Internal Medicine

## 2022-08-22 ENCOUNTER — Ambulatory Visit (INDEPENDENT_AMBULATORY_CARE_PROVIDER_SITE_OTHER): Payer: 59 | Admitting: Internal Medicine

## 2022-08-22 VITALS — BP 117/80 | HR 63 | Temp 97.9°F | Ht 62.0 in | Wt 160.0 lb

## 2022-08-22 DIAGNOSIS — C9000 Multiple myeloma not having achieved remission: Secondary | ICD-10-CM | POA: Diagnosis not present

## 2022-08-22 DIAGNOSIS — Z6829 Body mass index (BMI) 29.0-29.9, adult: Secondary | ICD-10-CM

## 2022-08-22 DIAGNOSIS — R632 Polyphagia: Secondary | ICD-10-CM

## 2022-08-22 DIAGNOSIS — E669 Obesity, unspecified: Secondary | ICD-10-CM | POA: Diagnosis not present

## 2022-08-22 NOTE — Assessment & Plan Note (Signed)
She is on Revlimid and has a history of compression fractures.  She has begun to do light strengthening exercises.  Patient advised to use caution and she is working with a Architectural technologist.

## 2022-08-22 NOTE — Progress Notes (Signed)
Office: [phone]  /  Fax: [phone]  WEIGHT SUMMARY AND BIOMETRICS  Medical Weight Loss Height: 5' 2"$  (1.575 m) Weight: 160 lb (72.6 kg) Temp: 97.9 F (36.6 C) Pulse Rate: 63 BP: 117/80 SpO2: 100 % Fasting: No Labs: No Today's Visit #: 3 Weight at Last VIsit: 166 lb Weight Lost Since Last Visit: 6 lb  Body Fat %: 36.7 % Fat Mass (lbs): 59 lbs Muscle Mass (lbs): 96.6 lbs Total Body Water (lbs): 66.2 lbs Visceral Fat Rating : 9 Peak Weight: 182 lb Starting Date: 07/13/22 Starting Weight: 167 lb Total Weight Loss (lbs): 12 lb (5.443 kg)    HPI  Chief Complaint: OBESITY  Katherine Beard is here to discuss her progress with her obesity treatment plan. She is on the keeping a food journal and adhering to recommended goals of 1200 calories and unsure protein and states she is following her eating plan approximately 100 % of the time. She states she is exercising 30-60 minutes 5 times per week.   Interval History:  This is my first encounter with Katherine Beard.  She has a history of multiple myeloma and compression fractures.  Since last office visit she has lost 6 lbs and reports excellent. adherence to prescribed reduced calorie nutrition plan. Has been working on getting protein and doing strength training along with cardio.  Denies problems with appetite and hunger signals.  She is currently on Lomaira 8 mg twice daily without any adverse effects. Denies problems with satiety and satiation.  Denies problems with eating patterns and portion control.  Sleeping approximately 7 hours a day.  Sleep described as restorative.  Stress levels are reported as high and due to Work.  Barriers identified none.    Pharmacotherapy: lomaira 8 mg twice a day  PHYSICAL EXAM:  Blood pressure 117/80, pulse 63, temperature 97.9 F (36.6 C), height 5' 2"$  (1.575 m), weight 160 lb (72.6 kg), last menstrual period 03/06/2011, SpO2 100 %. Body mass index is 29.26 kg/m.  General: She is  overweight, cooperative, alert, well developed, and in no acute distress. PSYCH: Has normal mood, affect and thought process.   HEENT: EOMI, sclerae are anicteric. Lungs: Normal breathing effort, no conversational dyspnea. Extremities: No edema.  Neurologic: No gross sensory or motor deficits. No tremors or fasciculations noted.    ASSESSMENT AND PLAN  TREATMENT PLAN FOR OBESITY:  Recommended Dietary Goals  [name] is currently in the action stage of change. As such, her goal is to continue weight management plan. She has agreed to keeping a food journal and adhering to recommended goals of 1200 calories and 85 g of protein.  Behavioral Intervention  We discussed the following Behavioral Modification Strategies today: increasing lean protein intake, increasing vegetables, increase water intake, reading food labels and making healthy choices when eating convenient foods, and work on tracking and journaling calories using tracking App.  Additional resources provided today: NA  Recommended Physical Activity Goals  [name] has been advised to work up to 150 minutes of moderate intensity aerobic activity a week and strengthening exercises 2-3 times per week for cardiovascular health, weight loss maintenance and preservation of muscle mass.   She has agreed to continue physical activity as is.    Pharmacotherapy We discussed various medication options to help Katherine Beard with her weight loss efforts and we both agreed to continue with nutritional and behavioral strategies.  ASSOCIATED CONDITIONS ADDRESSED TODAY  Polyphagia  Obesity,current BMI 29  Multiple myeloma, remission status unspecified ([place])  DIAGNOSTIC DATA REVIEWED:  BMET    Component Value Date/Time   NA 137 07/25/2022 0834   K 4.0 07/25/2022 0834   CL 103 07/25/2022 0834   CO2 27 07/25/2022 0834   GLUCOSE 104 (H) 07/25/2022 0834   BUN 16 07/25/2022 0834   CREATININE 0.90 07/25/2022 0834   CALCIUM 9.4 07/25/2022  0834   GFRNONAA >60 07/25/2022 0834   GFRAA 78 12/11/2006 1036   Lab Results  Component Value Date   HGBA1C 5.4 07/13/2022   HGBA1C 5.6 12/11/2006   Lab Results  Component Value Date   INSULIN 4.4 07/13/2022   Lab Results  Component Value Date   TSH 1.360 07/13/2022   CBC    Component Value Date/Time   WBC 3.8 (L) 08/03/2022 1143   WBC 3.8 (L) 09/28/2020 0958   RBC 4.82 08/03/2022 1143   HGB 15.1 (H) 08/03/2022 1143   HCT 43.9 08/03/2022 1143   PLT 229 08/03/2022 1143   MCV 91.1 08/03/2022 1143   MCH 31.3 08/03/2022 1143   MCHC 34.4 08/03/2022 1143   RDW 13.2 08/03/2022 1143   Iron Studies    Component Value Date/Time   IRON 65 07/08/2020 1509   IRONPCTSAT 17.4 (L) 07/08/2020 1509   Lipid Panel     Component Value Date/Time   CHOL 225 (H) 07/13/2022 0931   TRIG 171 (H) 07/13/2022 0931   HDL 63 07/13/2022 0931   CHOLHDL 3.6 07/13/2022 0931   CHOLHDL 3 09/12/2019 0847   VLDL 14.8 09/12/2019 0847   LDLCALC 132 (H) 07/13/2022 0931   LDLDIRECT 173.7 11/26/2010 0756   Hepatic Function Panel     Component Value Date/Time   PROT 6.5 07/25/2022 0834   ALBUMIN 4.4 07/25/2022 0834   AST 19 07/25/2022 0834   ALT 19 07/25/2022 0834   ALKPHOS 28 (L) 07/25/2022 0834   BILITOT 0.5 07/25/2022 0834   BILIDIR 0.1 11/26/2010 0756   IBILI 0.4 11/30/2007 2242      Component Value Date/Time   TSH 1.360 07/13/2022 0931   Nutritional Lab Results  Component Value Date   VD25OH 29.6 (L) 07/13/2022   VD25OH 69.83 07/08/2020   VD25OH 53.47 09/12/2019      Return for Follow-Dr. Valetta Close in April 8 th if available... She was informed of the importance of frequent follow up visits to maximize her success with intensive lifestyle modifications for her multiple health conditions.    ATTESTASTION STATEMENTS:  Reviewed by clinician on day of visit: allergies, medications, problem list, medical history, surgical history, family history, social history, and previous encounter  notes.   Time spent on visit including pre-visit chart review and post-visit care and charting was 30 minutes.    Thomes Dinning, MD

## 2022-08-22 NOTE — Assessment & Plan Note (Signed)
Improved.  Currently on Lomaira 8 mg twice daily without any adverse effects.  She will continue current medication.  She will also work on getting at least 30 g of protein with every meal to assist with appetite suppression.

## 2022-08-29 ENCOUNTER — Inpatient Hospital Stay: Payer: 59

## 2022-08-29 ENCOUNTER — Inpatient Hospital Stay: Payer: 59 | Attending: Oncology | Admitting: Oncology

## 2022-08-29 VITALS — BP 125/82 | HR 61 | Temp 98.1°F | Resp 18 | Ht 62.0 in | Wt 168.2 lb

## 2022-08-29 DIAGNOSIS — M8588 Other specified disorders of bone density and structure, other site: Secondary | ICD-10-CM | POA: Insufficient documentation

## 2022-08-29 DIAGNOSIS — F32A Depression, unspecified: Secondary | ICD-10-CM | POA: Insufficient documentation

## 2022-08-29 DIAGNOSIS — C9000 Multiple myeloma not having achieved remission: Secondary | ICD-10-CM

## 2022-08-29 DIAGNOSIS — Z85820 Personal history of malignant melanoma of skin: Secondary | ICD-10-CM | POA: Insufficient documentation

## 2022-08-29 LAB — CMP (CANCER CENTER ONLY)
ALT: 19 U/L (ref 0–44)
AST: 18 U/L (ref 15–41)
Albumin: 4.3 g/dL (ref 3.5–5.0)
Alkaline Phosphatase: 28 U/L — ABNORMAL LOW (ref 38–126)
Anion gap: 10 (ref 5–15)
BUN: 15 mg/dL (ref 6–20)
CO2: 24 mmol/L (ref 22–32)
Calcium: 9.3 mg/dL (ref 8.9–10.3)
Chloride: 105 mmol/L (ref 98–111)
Creatinine: 0.81 mg/dL (ref 0.44–1.00)
GFR, Estimated: >60 mL/min (ref >60)
Glucose, Bld: 88 mg/dL (ref 70–99)
Potassium: 3.8 mmol/L (ref 3.5–5.1)
Sodium: 139 mmol/L (ref 135–145)
Total Bilirubin: 0.4 mg/dL (ref 0.3–1.2)
Total Protein: 6.5 g/dL (ref 6.5–8.1)

## 2022-08-29 LAB — CBC WITH DIFFERENTIAL (CANCER CENTER ONLY)
Abs Immature Granulocytes: 0 10*3/uL (ref 0.00–0.07)
Basophils Absolute: 0 10*3/uL (ref 0.0–0.1)
Basophils Relative: 1 %
Eosinophils Absolute: 0 10*3/uL (ref 0.0–0.5)
Eosinophils Relative: 1 %
HCT: 41.5 % (ref 36.0–46.0)
Hemoglobin: 14 g/dL (ref 12.0–15.0)
Immature Granulocytes: 0 %
Lymphocytes Relative: 30 %
Lymphs Abs: 0.8 10*3/uL (ref 0.7–4.0)
MCH: 31.1 pg (ref 26.0–34.0)
MCHC: 33.7 g/dL (ref 30.0–36.0)
MCV: 92.2 fL (ref 80.0–100.0)
Monocytes Absolute: 0.3 10*3/uL (ref 0.1–1.0)
Monocytes Relative: 10 %
Neutro Abs: 1.4 10*3/uL — ABNORMAL LOW (ref 1.7–7.7)
Neutrophils Relative %: 58 %
Platelet Count: 233 10*3/uL (ref 150–400)
RBC: 4.5 MIL/uL (ref 3.87–5.11)
RDW: 13.8 % (ref 11.5–15.5)
WBC Count: 2.5 10*3/uL — ABNORMAL LOW (ref 4.0–10.5)
nRBC: 0 % (ref 0.0–0.2)

## 2022-08-29 MED ORDER — ZOLEDRONIC ACID 4 MG/100ML IV SOLN
4.0000 mg | Freq: Once | INTRAVENOUS | Status: AC
  Administered 2022-08-29: 4 mg via INTRAVENOUS
  Filled 2022-08-29: qty 100

## 2022-08-29 NOTE — Progress Notes (Signed)
Patient seen by Dr. Benay Spice today  Vitals are within treatment parameters.  Labs reviewed by Dr. Benay Spice CBC diff reviewed and within treatment parameters, CMP pending.  Per physician team, patient is ready for treatment and there are NO modifications to the treatment plan.

## 2022-08-29 NOTE — Patient Instructions (Signed)

## 2022-08-29 NOTE — Progress Notes (Signed)
[place] OFFICE PROGRESS NOTE   Diagnosis: Multiple myeloma  INTERVAL HISTORY:   Ms. Baysinger returns as scheduled.  She is due to begin another cycle of maintenance Revlimid today.  She returned from vacation early this morning.  Back pain is improved.  She continues exercise.  She is scheduled to see a spine specialist later this week.  She reports intermittent numbness in the left second third and fourth fingers.  This improves when she warmed to the hand.  She also has intermittent numbness in the left toes.  Objective:  Vital signs in last 24 hours:  Blood pressure 125/82, pulse 61, temperature 98.1 F (36.7 C), temperature source Oral, resp. rate 18, height '5\' 2"'$  (1.575 m), weight 168 lb 3.2 oz (76.3 kg), last menstrual period 03/06/2011, SpO2 100 %.    HEENT: No thrush Resp: Lungs clear bilaterally Cardio: Regular rate and rhythm GI: Nontender, no hepatosplenomegaly Vascular: No leg edema    Lab Results:  Lab Results  Component Value Date   WBC 2.5 (L) 08/29/2022   HGB 14.0 08/29/2022   HCT 41.5 08/29/2022   MCV 92.2 08/29/2022   PLT 233 08/29/2022   NEUTROABS 1.4 (L) 08/29/2022    CMP  Lab Results  Component Value Date   NA 137 07/25/2022   K 4.0 07/25/2022   CL 103 07/25/2022   CO2 27 07/25/2022   GLUCOSE 104 (H) 07/25/2022   BUN 16 07/25/2022   CREATININE 0.90 07/25/2022   CALCIUM 9.4 07/25/2022   PROT 6.5 07/25/2022   ALBUMIN 4.4 07/25/2022   AST 19 07/25/2022   ALT 19 07/25/2022   ALKPHOS 28 (L) 07/25/2022   BILITOT 0.5 07/25/2022   GFRNONAA >60 07/25/2022   GFRAA 78 12/11/2006     Medications: I have reviewed the patient's current medications.   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 Increase serum free lambda light chains Serum M spike MRI pelvis  09/01/2020-innumerable small foci of signal abnormality concerning for multiple myeloma involvement Metastatic bone survey 09/03/2020-no discrete lytic bone lesions, compression fractures at T12, L1, L2, and L3, osteopenia Cycle 1 RVD 09/14/2020 Daratumumab beginning 09/28/2020 Cycle 2 RVD 10/05/2020 plus daratumumab Week 3 daratumumab 10/12/2020 Normal lambda light chains 10/26/2020 Cycle 3 RVD plus daratumumab 10/26/2020 Cycle 4 RVD plus daratumumab 11/16/2020, daratumumab changed to an every 2-week schedule beginning 11/23/2020 Cycle 5 RVD plus daratumumab 12/07/2020 Cycle 6 RVD plus daratumumab 12/28/2020 Normal lambda light chains 01/05/2021 Revlimid maintenance 21/28 days beginning 01/18/2021 [facility] harvest [facility] 03/29/2021 Revlimid resumed 03/30/2021   2. Severe back pain MRI lumbar spine July 12, 2020-subacute compression fractures at T12 and L1 MRI lumbar spine July 30, 2020-subacute T12 and L1 compression fractures, new subacute superior L2 endplate deformity MRI lumbar spine September 01, 2020 new/progressive compression fractures at L2 and L3, acute compression fracture at L4, evolution of T12 and L1 compression fractures MRI pelvis September 01, 2020-innumerable small foci of signal abnormality in the pelvis and femurs, no fracture identified, concerning for multiple myeloma versus metastases Zometa beginning 09/21/2020-plan monthly x3 then every 3 months, next due 11/19/2021 MRI thoracic spine July 2022-new T3 compression fracture with multiple additional thoracic compression fractures 3. Melanoma left upper back 2014 4. History of diverticulitis 5. Mild hypercalcemia at presentation-resolved 6. Osteoporosis 7. [place] 8.  Depression-worsened depression symptoms 01/11/2021, referred to psychology  Disposition: Ms. Aron appears stable.  She will continue Revlimid maintenance.  We will follow-up on the serum light chains from today.  She will receive Zometa today.  Ms. Hallam will return  for an office and lab visit in 1 month.  She will contact us for increased neuropathy symptoms.  The left hand symptoms may be related to Raynaud's as opposed to neuropathy.  [name], MD  08/29/2022  12:34 PM

## 2022-08-30 LAB — KAPPA/LAMBDA LIGHT CHAINS
Kappa free light chain: 11.4 mg/L (ref 3.3–19.4)
Kappa, lambda light chain ratio: 1.15 (ref 0.26–1.65)
Lambda free light chains: 9.9 mg/L (ref 5.7–26.3)

## 2022-09-15 ENCOUNTER — Ambulatory Visit (INDEPENDENT_AMBULATORY_CARE_PROVIDER_SITE_OTHER): Payer: 59 | Admitting: Family Medicine

## 2022-09-18 ENCOUNTER — Other Ambulatory Visit: Payer: Self-pay | Admitting: Oncology

## 2022-09-18 ENCOUNTER — Other Ambulatory Visit: Payer: Self-pay | Admitting: Nurse Practitioner

## 2022-09-18 DIAGNOSIS — C9000 Multiple myeloma not having achieved remission: Secondary | ICD-10-CM

## 2022-09-19 ENCOUNTER — Other Ambulatory Visit: Payer: Self-pay | Admitting: Oncology

## 2022-09-19 ENCOUNTER — Encounter: Payer: Self-pay | Admitting: Oncology

## 2022-09-19 ENCOUNTER — Other Ambulatory Visit: Payer: Self-pay | Admitting: Nurse Practitioner

## 2022-09-19 DIAGNOSIS — C9000 Multiple myeloma not having achieved remission: Secondary | ICD-10-CM

## 2022-09-19 MED ORDER — OXYCODONE HCL 5 MG PO TABS: 5.0000 mg | ORAL_TABLET | Freq: Four times a day (QID) | ORAL | 0 refills | Status: DC | PRN

## 2022-09-19 MED ORDER — DIAZEPAM 5 MG PO TABS: 5.0000 mg | ORAL_TABLET | Freq: Two times a day (BID) | ORAL | 0 refills | Status: DC | PRN

## 2022-09-22 ENCOUNTER — Encounter: Payer: Self-pay | Admitting: Oncology

## 2022-09-22 ENCOUNTER — Ambulatory Visit (INDEPENDENT_AMBULATORY_CARE_PROVIDER_SITE_OTHER): Payer: 59 | Admitting: Family Medicine

## 2022-09-22 ENCOUNTER — Encounter (INDEPENDENT_AMBULATORY_CARE_PROVIDER_SITE_OTHER): Payer: Self-pay | Admitting: Family Medicine

## 2022-09-22 VITALS — BP 115/78 | HR 59 | Temp 97.7°F | Ht 62.0 in | Wt 161.0 lb

## 2022-09-22 DIAGNOSIS — C9000 Multiple myeloma not having achieved remission: Secondary | ICD-10-CM | POA: Diagnosis not present

## 2022-09-22 DIAGNOSIS — E669 Obesity, unspecified: Secondary | ICD-10-CM

## 2022-09-22 DIAGNOSIS — R632 Polyphagia: Secondary | ICD-10-CM

## 2022-09-22 DIAGNOSIS — E559 Vitamin D deficiency, unspecified: Secondary | ICD-10-CM | POA: Diagnosis not present

## 2022-09-22 DIAGNOSIS — Z6829 Body mass index (BMI) 29.0-29.9, adult: Secondary | ICD-10-CM

## 2022-09-22 MED ORDER — PHENTERMINE HCL 15 MG PO CAPS: ORAL_CAPSULE | ORAL | 0 refills | Status: DC

## 2022-09-22 NOTE — Progress Notes (Signed)
Office: [phone]  /  Fax: [place]  Starting Date: 07/13/22  Starting Weight: 167lb   Weight Lost Since Last Visit: 0   Vitals Temp: 97.7 F (36.5 C) BP: 115/78 Pulse Rate: (!) 59 SpO2: 100 %   Body Composition  Body Fat %: 36 % Fat Mass (lbs): 58 lbs Muscle Mass (lbs): 98 lbs Total Body Water (lbs): 64.8 lbs Visceral Fat Rating : 9   HPI  Chief Complaint: OBESITY  Katherine Beard is here to discuss her progress with her obesity treatment plan. She is on the the Category 1 Plan and states she is following her eating plan approximately 80 % of the time. She states she is exercising 0 minutes 0 times per week.   Interval History:  Since last office visit she is the same weight as last visit She has been dealing with worsening back pain the past week needing to take more pain meds which has worsened the constipation She isn't feeling as much satiety from Katherine Beard She has cut out alcohol She is trying to eat on a schedule but she hasn't felt as good due to pain She hasn't been able to work out as much the past week due to pain  Pharmacotherapy: Lomaira 8 mg 1-2 x a day  PHYSICAL EXAM:  Blood pressure 115/78, pulse (!) 59, temperature 97.7 F (36.5 C), height 5\' 2"  (1.575 m), weight 161 lb (73 kg), last menstrual period 03/06/2011, SpO2 100 %. Body mass index is 29.45 kg/m.  General: She is normal weight,  cooperative, alert, well developed, and in no acute distress. PSYCH: Has normal mood, affect and thought process.   Lungs: Normal breathing effort, no conversational dyspnea.   ASSESSMENT AND PLAN  TREATMENT PLAN FOR OBESITY:  Recommended Dietary Goals  Katherine Beard is currently in the action stage of change. As such, her goal is to continue weight management plan. She has agreed to the Category 1 Plan.  Behavioral Intervention  We discussed the following Behavioral Modification Strategies today: increasing lean protein intake,  increasing vegetables, increasing fiber rich foods, increasing water intake, work on meal planning and easy cooking plans, reading food labels , planning for success, and keeping healthy foods at home.  Additional resources provided today: NA  Recommended Physical Activity Goals  Katherine Beard has been advised to work up to 150 minutes of moderate intensity aerobic activity a week and strengthening exercises 2-3 times per week for cardiovascular health, weight loss maintenance and preservation of muscle mass.   She has agreed to Work on scheduling and tracking physical activity.   Pharmacotherapy changes for the treatment of obesity: change Lomaira 8 mg bid --> Phentermine 15 mg 1 hr before breakfast   ASSOCIATED CONDITIONS ADDRESSED TODAY  Polyphagia Assessment & Plan: Improving with eating on a schedule, lean protein and fiber with meals and use of Lomaira 8 mg tab twice daily.  Her blood pressure and heart rate have been stable.  She has noticed a lack of satiety from Katherine Beard over the past 3 weeks.  Will change from Lomaira 8 mg tab twice daily to phentermine 15 mg capsule once daily dosed 1 hour prior to lunch.  Continue to focus on adequate nutrient intake at mealtime, limiting intake of sweets, adequate sleep at night.  Orders: -     Phentermine HCl; 1 capsule po 1 hr before lunch daily  Dispense: 30 capsule; Refill: 0  Generalized obesity  BMI 29.0-29.9,adult  Multiple myeloma, remission status unspecified (Katherine Beard) Assessment &  Plan: Followed closely by oncology.  Fractures due to multiple myeloma have caused chronic back pain with flareups.  This has limited her ability to do vigorous exercise which has affected her weight.  She is trying to lose weight to improve her mobility and reduce pain.  Plan: Follow-up with oncology.  Exercise as tolerated.   Vitamin D deficiency Assessment & Plan: Last vitamin D Lab Results  Component Value Date   VD25OH 29.6 (L) 07/13/2022   Taking  vitamin D 4000 IU once daily.  Energy level is stable.  Plan to recheck vitamin D level in the next 1 to 2 months.       She was informed of the importance of frequent follow up visits to maximize her success with intensive lifestyle modifications for her multiple health conditions.   ATTESTASTION STATEMENTS:  Reviewed by clinician on day of visit: allergies, medications, problem list, medical history, surgical history, family history, social history, and previous encounter notes pertinent to obesity diagnosis.   I have personally spent 30 minutes total time today in preparation, patient care, nutritional counseling and documentation for this visit, including the following: review of clinical lab tests; review of medical tests/procedures/services.      Dell Ponto, DO DABFM, DABOM [facility] [address] [place], [place] [address] [phone]

## 2022-09-22 NOTE — Assessment & Plan Note (Signed)
Improving with eating on a schedule, lean protein and fiber with meals and use of Lomaira 8 mg tab twice daily.  Her blood pressure and heart rate have been stable.  She has noticed a lack of satiety from [place] over the past 3 weeks.  Will change from Lomaira 8 mg tab twice daily to phentermine 15 mg capsule once daily dosed 1 hour prior to lunch.  Continue to focus on adequate nutrient intake at mealtime, limiting intake of sweets, adequate sleep at night.

## 2022-09-22 NOTE — Assessment & Plan Note (Signed)
Followed closely by oncology.  Fractures due to multiple myeloma have caused chronic back pain with flareups.  This has limited her ability to do vigorous exercise which has affected her weight.  She is trying to lose weight to improve her mobility and reduce pain.  Plan: Follow-up with oncology.  Exercise as tolerated.

## 2022-09-22 NOTE — Assessment & Plan Note (Signed)
Last vitamin D Lab Results  Component Value Date   VD25OH 29.6 (L) 07/13/2022   Taking vitamin D 4000 IU once daily.  Energy level is stable.  Plan to recheck vitamin D level in the next 1 to 2 months.

## 2022-09-27 ENCOUNTER — Inpatient Hospital Stay: Payer: 59 | Admitting: Oncology

## 2022-09-27 ENCOUNTER — Inpatient Hospital Stay: Payer: 59 | Attending: Oncology

## 2022-09-27 VITALS — BP 129/82 | HR 60 | Temp 98.2°F | Resp 18 | Ht 62.0 in | Wt 169.0 lb

## 2022-09-27 DIAGNOSIS — Z85820 Personal history of malignant melanoma of skin: Secondary | ICD-10-CM | POA: Insufficient documentation

## 2022-09-27 DIAGNOSIS — C9000 Multiple myeloma not having achieved remission: Secondary | ICD-10-CM | POA: Insufficient documentation

## 2022-09-27 DIAGNOSIS — F32A Depression, unspecified: Secondary | ICD-10-CM | POA: Diagnosis not present

## 2022-09-27 DIAGNOSIS — M810 Age-related osteoporosis without current pathological fracture: Secondary | ICD-10-CM | POA: Insufficient documentation

## 2022-09-27 LAB — CBC WITH DIFFERENTIAL (CANCER CENTER ONLY)
Abs Immature Granulocytes: 0.01 10*3/uL (ref 0.00–0.07)
Basophils Absolute: 0 10*3/uL (ref 0.0–0.1)
Basophils Relative: 2 %
Eosinophils Absolute: 0.1 10*3/uL (ref 0.0–0.5)
Eosinophils Relative: 2 %
HCT: 38.9 % (ref 36.0–46.0)
Hemoglobin: 13.3 g/dL (ref 12.0–15.0)
Immature Granulocytes: 0 %
Lymphocytes Relative: 35 %
Lymphs Abs: 0.9 10*3/uL (ref 0.7–4.0)
MCH: 31.3 pg (ref 26.0–34.0)
MCHC: 34.2 g/dL (ref 30.0–36.0)
MCV: 91.5 fL (ref 80.0–100.0)
Monocytes Absolute: 0.4 10*3/uL (ref 0.1–1.0)
Monocytes Relative: 13 %
Neutro Abs: 1.2 10*3/uL — ABNORMAL LOW (ref 1.7–7.7)
Neutrophils Relative %: 48 %
Platelet Count: 226 10*3/uL (ref 150–400)
RBC: 4.25 MIL/uL (ref 3.87–5.11)
RDW: 13.4 % (ref 11.5–15.5)
WBC Count: 2.6 10*3/uL — ABNORMAL LOW (ref 4.0–10.5)
nRBC: 0 % (ref 0.0–0.2)

## 2022-09-27 LAB — CMP (CANCER CENTER ONLY)
ALT: 22 U/L (ref 0–44)
AST: 16 U/L (ref 15–41)
Albumin: 4.2 g/dL (ref 3.5–5.0)
Alkaline Phosphatase: 36 U/L — ABNORMAL LOW (ref 38–126)
Anion gap: 4 — ABNORMAL LOW (ref 5–15)
BUN: 23 mg/dL — ABNORMAL HIGH (ref 6–20)
CO2: 29 mmol/L (ref 22–32)
Calcium: 9.3 mg/dL (ref 8.9–10.3)
Chloride: 104 mmol/L (ref 98–111)
Creatinine: 0.86 mg/dL (ref 0.44–1.00)
GFR, Estimated: >60 mL/min (ref >60)
Glucose, Bld: 64 mg/dL — ABNORMAL LOW (ref 70–99)
Potassium: 4.2 mmol/L (ref 3.5–5.1)
Sodium: 137 mmol/L (ref 135–145)
Total Bilirubin: 0.3 mg/dL (ref 0.3–1.2)
Total Protein: 6.1 g/dL — ABNORMAL LOW (ref 6.5–8.1)

## 2022-09-27 NOTE — Progress Notes (Signed)
[place] OFFICE PROGRESS NOTE   Diagnosis: Multiple myeloma  INTERVAL HISTORY:   Katherine Beard begin another cycle of Revlimid on 09/01/2022.  She has diarrhea for a few days at the beginning of the Revlimid cycle.  She has intermittent tingling in the extremities.  She reports increased malaise for the past few weeks.  She has increased back pain.  No fever or symptoms of infection.  She has been traveling with work.  She continues to exercise.  She was seen at the [facility] rehabilitation clinic on 09/02/2022.  Objective:  Vital signs in last 24 hours:  Blood pressure 129/82, pulse 60, temperature 98.2 F (36.8 C), temperature source Oral, resp. rate 18, height 5\' 2"  (1.575 m), weight 169 lb (76.7 kg), last menstrual period 03/06/2011, SpO2 100 %.    HEENT: No thrush Resp: Lungs clear bilaterally Cardio: Regular rate and rhythm GI: No hepatosplenomegaly Vascular: No leg edema   Lab Results:  Lab Results  Component Value Date   WBC 2.6 (L) 09/27/2022   HGB 13.3 09/27/2022   HCT 38.9 09/27/2022   MCV 91.5 09/27/2022   PLT 226 09/27/2022   NEUTROABS 1.2 (L) 09/27/2022    CMP  Lab Results  Component Value Date   NA 139 08/29/2022   K 3.8 08/29/2022   CL 105 08/29/2022   CO2 24 08/29/2022   GLUCOSE 88 08/29/2022   BUN 15 08/29/2022   CREATININE 0.81 08/29/2022   CALCIUM 9.3 08/29/2022   PROT 6.5 08/29/2022   ALBUMIN 4.3 08/29/2022   AST 18 08/29/2022   ALT 19 08/29/2022   ALKPHOS 28 (L) 08/29/2022   BILITOT 0.4 08/29/2022   GFRNONAA >60 08/29/2022   GFRAA 78 12/11/2006    Medications: I have reviewed the patient's current medications.   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 Increase serum free lambda light chains Serum M spike MRI pelvis 09/01/2020-innumerable small foci of  signal abnormality concerning for multiple myeloma involvement Metastatic bone survey 09/03/2020-no discrete lytic bone lesions, compression fractures at T12, L1, L2, and L3, osteopenia Cycle 1 RVD 09/14/2020 Daratumumab beginning 09/28/2020 Cycle 2 RVD 10/05/2020 plus daratumumab Week 3 daratumumab 10/12/2020 Normal lambda light chains 10/26/2020 Cycle 3 RVD plus daratumumab 10/26/2020 Cycle 4 RVD plus daratumumab 11/16/2020, daratumumab changed to an every 2-week schedule beginning 11/23/2020 Cycle 5 RVD plus daratumumab 12/07/2020 Cycle 6 RVD plus daratumumab 12/28/2020 Normal lambda light chains 01/05/2021 Revlimid maintenance 21/28 days beginning 01/18/2021 Stem cell harvest at [facility] 03/29/2021 Revlimid resumed 03/30/2021   2. Severe back pain MRI lumbar spine July 12, 2020-subacute compression fractures at T12 and L1 MRI lumbar spine July 30, 2020-subacute T12 and L1 compression fractures, new subacute superior L2 endplate deformity MRI lumbar spine September 01, 2020 new/progressive compression fractures at L2 and L3, acute compression fracture at L4, evolution of T12 and L1 compression fractures MRI pelvis September 01, 2020-innumerable small foci of signal abnormality in the pelvis and femurs, no fracture identified, concerning for multiple myeloma versus metastases Zometa beginning 09/21/2020-plan monthly x3 then every 3 months, next due 11/19/2021 MRI thoracic spine July 2022-new T3 compression fracture with multiple additional thoracic compression fractures 3. Melanoma left upper back 2014 4. History of diverticulitis 5. Mild hypercalcemia at presentation-resolved 6. Osteoporosis 7. Evusheld 8.  Depression-worsened depression symptoms 01/11/2021, referred to psychology       Disposition: Ms. Katherine Beard has  multiple myeloma.  She continues Revlimid maintenance.  She will begin another cycle tomorrow.  She has chronic mild neutropenia.  She will call for a fever or symptoms of an infection.  She has  chronic back pain secondary to compression fractures.  She will continue an exercise program.  The etiology of the fatigue over the last few weeks is unclear.  She reports seeing brand-name Revlimid for the most recent cycle.  This was the only change in her medication.  She will call for increased fatigue or new symptoms.  The serum free light chains were normal last month.  We will follow-up on the light chains and chemistry panel from today.  She will return for an office and lab visit in 4 weeks.  Katherine Coder, MD  09/27/2022  10:40 AM

## 2022-09-28 LAB — KAPPA/LAMBDA LIGHT CHAINS
Kappa free light chain: 12.8 mg/L (ref 3.3–19.4)
Kappa, lambda light chain ratio: 1.21 (ref 0.26–1.65)
Lambda free light chains: 10.6 mg/L (ref 5.7–26.3)

## 2022-09-29 ENCOUNTER — Encounter: Payer: Self-pay | Admitting: Oncology

## 2022-10-06 ENCOUNTER — Ambulatory Visit (INDEPENDENT_AMBULATORY_CARE_PROVIDER_SITE_OTHER): Payer: 59 | Admitting: Family Medicine

## 2022-10-07 ENCOUNTER — Encounter: Payer: Self-pay | Admitting: Oncology

## 2022-10-07 ENCOUNTER — Other Ambulatory Visit: Payer: Self-pay | Admitting: Oncology

## 2022-10-12 ENCOUNTER — Ambulatory Visit: Payer: 59 | Admitting: Internal Medicine

## 2022-10-14 ENCOUNTER — Ambulatory Visit (INDEPENDENT_AMBULATORY_CARE_PROVIDER_SITE_OTHER): Payer: 59 | Admitting: Internal Medicine

## 2022-10-14 ENCOUNTER — Other Ambulatory Visit (INDEPENDENT_AMBULATORY_CARE_PROVIDER_SITE_OTHER): Payer: 59

## 2022-10-14 ENCOUNTER — Other Ambulatory Visit: Payer: Self-pay

## 2022-10-14 ENCOUNTER — Telehealth: Payer: Self-pay | Admitting: Internal Medicine

## 2022-10-14 ENCOUNTER — Encounter: Payer: Self-pay | Admitting: Internal Medicine

## 2022-10-14 VITALS — BP 120/68 | HR 55 | Temp 97.7°F | Ht 62.0 in | Wt 168.0 lb

## 2022-10-14 DIAGNOSIS — Z0001 Encounter for general adult medical examination with abnormal findings: Secondary | ICD-10-CM | POA: Diagnosis not present

## 2022-10-14 DIAGNOSIS — R5383 Other fatigue: Secondary | ICD-10-CM

## 2022-10-14 DIAGNOSIS — C9000 Multiple myeloma not having achieved remission: Secondary | ICD-10-CM

## 2022-10-14 LAB — CORTISOL: Cortisol, Plasma: 2.8 ug/dL

## 2022-10-14 NOTE — Assessment & Plan Note (Signed)
Flu shot yearly. Pneumonia counseled. Shingrix counseled. Tetanus counseled. Colonoscopy up to date per patient. Mammogram up to date, pap smear up to date. Counseled about sun safety and mole surveillance. Counseled about the dangers of distracted driving. Given 10 year screening recommendations.

## 2022-10-14 NOTE — Assessment & Plan Note (Signed)
This could be related to medication or recent stop of B12. Checking ferritin and cortisol. Treat as appropriate and she will resume otc vitamin B12.

## 2022-10-14 NOTE — Telephone Encounter (Signed)
This has been fixed.  

## 2022-10-14 NOTE — Progress Notes (Signed)
   Subjective:   Patient ID: Katherine Beard, female    DOB: 10-17-1960, 63 y.o.   MRN: [id number]  HPI The patient is a 62 YO female coming in for physical. Worsening fatigue.   PMH, [facility], social history reviewed and updated  Review of Systems  Constitutional:  Positive for fatigue.  HENT: Negative.    Eyes: Negative.   Respiratory:  Negative for cough, chest tightness and shortness of breath.   Cardiovascular:  Negative for chest pain, palpitations and leg swelling.  Gastrointestinal:  Negative for abdominal distention, abdominal pain, constipation, diarrhea, nausea and vomiting.  Musculoskeletal: Negative.   Skin: Negative.   Neurological: Negative.   Psychiatric/Behavioral: Negative.      Objective:  Physical Exam Constitutional:      Appearance: She is well-developed.  HENT:     Head: Normocephalic and atraumatic.  Cardiovascular:     Rate and Rhythm: Normal rate and regular rhythm.  Pulmonary:     Effort: Pulmonary effort is normal. No respiratory distress.     Breath sounds: Normal breath sounds. No wheezing or rales.  Abdominal:     General: Bowel sounds are normal. There is no distension.     Palpations: Abdomen is soft.     Tenderness: There is no abdominal tenderness. There is no rebound.  Musculoskeletal:     Cervical back: Normal range of motion.  Skin:    General: Skin is warm and dry.  Neurological:     Mental Status: She is alert and oriented to person, place, and time.     Coordination: Coordination normal.     Vitals:   10/14/22 1603  BP: 120/68  Pulse: (!) 55  Temp: 97.7 F (36.5 C)  TempSrc: Oral  SpO2: 98%  Weight: 168 lb (76.2 kg)  Height: 5\' 2"  (1.575 m)    Assessment & Plan:

## 2022-10-14 NOTE — Telephone Encounter (Signed)
Patient called and said lab orders are not showing up for the lab. They need to be marked as future for them to receive them. She would like a callback when that is updated. Best callback is [phone].

## 2022-10-16 ENCOUNTER — Other Ambulatory Visit: Payer: Self-pay | Admitting: Nurse Practitioner

## 2022-10-16 DIAGNOSIS — C9000 Multiple myeloma not having achieved remission: Secondary | ICD-10-CM

## 2022-10-17 ENCOUNTER — Other Ambulatory Visit: Payer: Self-pay | Admitting: Nurse Practitioner

## 2022-10-17 ENCOUNTER — Other Ambulatory Visit: Payer: Self-pay | Admitting: Internal Medicine

## 2022-10-17 DIAGNOSIS — C9000 Multiple myeloma not having achieved remission: Secondary | ICD-10-CM

## 2022-10-17 DIAGNOSIS — R5383 Other fatigue: Secondary | ICD-10-CM

## 2022-10-17 LAB — FERRITIN: Ferritin: 79.6 ng/mL (ref 10.0–291.0)

## 2022-10-17 MED ORDER — OXYCODONE HCL 5 MG PO TABS
5.0000 mg | ORAL_TABLET | Freq: Four times a day (QID) | ORAL | 0 refills | Status: DC | PRN
Start: 2022-10-17 — End: 2022-11-08

## 2022-10-18 ENCOUNTER — Other Ambulatory Visit: Payer: Self-pay | Admitting: Oncology

## 2022-10-20 ENCOUNTER — Telehealth: Payer: Self-pay

## 2022-10-20 NOTE — Telephone Encounter (Signed)
Oral Oncology Patient Advocate Encounter  Re-authorization   Received notification that prior authorization for Lenalidomide is due for renewal.   PA submitted on 10/20/22  Key [id number]  Status is pending     Ardeen Fillers, CPhT Oncology Pharmacy Patient Advocate  [facility]  [phone] (phone) [phone] (fax) 10/20/2022 11:38 AM

## 2022-10-21 ENCOUNTER — Other Ambulatory Visit: Payer: Self-pay | Admitting: Oncology

## 2022-10-21 DIAGNOSIS — C9000 Multiple myeloma not having achieved remission: Secondary | ICD-10-CM

## 2022-10-23 ENCOUNTER — Encounter (INDEPENDENT_AMBULATORY_CARE_PROVIDER_SITE_OTHER): Payer: Self-pay

## 2022-10-24 ENCOUNTER — Encounter (INDEPENDENT_AMBULATORY_CARE_PROVIDER_SITE_OTHER): Payer: Self-pay

## 2022-10-24 ENCOUNTER — Ambulatory Visit (INDEPENDENT_AMBULATORY_CARE_PROVIDER_SITE_OTHER): Payer: 59 | Admitting: Family Medicine

## 2022-10-24 ENCOUNTER — Encounter: Payer: Self-pay | Admitting: Oncology

## 2022-10-26 ENCOUNTER — Inpatient Hospital Stay: Payer: 59

## 2022-10-26 ENCOUNTER — Inpatient Hospital Stay: Payer: 59 | Admitting: Oncology

## 2022-10-28 ENCOUNTER — Other Ambulatory Visit (HOSPITAL_COMMUNITY): Payer: Self-pay

## 2022-10-28 ENCOUNTER — Encounter: Payer: Self-pay | Admitting: Oncology

## 2022-10-28 NOTE — Telephone Encounter (Signed)
Oral Oncology Patient Advocate Encounter  Prior Authorization for Lenalidomide has been approved.    PA# [id number]  Effective dates: 10/20/22 through 10/20/23  Patient may continue to fill at [facility].    Ardeen Fillers, CPhT Oncology Pharmacy Patient Advocate  [facility]  [phone] (phone) [phone] (fax) 10/28/2022 12:47 PM

## 2022-11-08 ENCOUNTER — Other Ambulatory Visit: Payer: Self-pay | Admitting: Nurse Practitioner

## 2022-11-08 DIAGNOSIS — C9000 Multiple myeloma not having achieved remission: Secondary | ICD-10-CM

## 2022-11-08 MED ORDER — OXYCODONE HCL 5 MG PO TABS
5.0000 mg | ORAL_TABLET | Freq: Four times a day (QID) | ORAL | 0 refills | Status: DC | PRN
Start: 2022-11-08 — End: 2022-11-29

## 2022-11-17 ENCOUNTER — Other Ambulatory Visit: Payer: Self-pay | Admitting: Oncology

## 2022-11-18 ENCOUNTER — Other Ambulatory Visit: Payer: Self-pay | Admitting: Oncology

## 2022-11-18 DIAGNOSIS — C9000 Multiple myeloma not having achieved remission: Secondary | ICD-10-CM

## 2022-11-18 MED ORDER — LENALIDOMIDE 10 MG PO CAPS
10.0000 mg | ORAL_CAPSULE | Freq: Every day | ORAL | 0 refills | Status: DC
Start: 2022-11-18 — End: 2022-12-14

## 2022-11-29 ENCOUNTER — Inpatient Hospital Stay: Payer: 59

## 2022-11-29 ENCOUNTER — Encounter: Payer: Self-pay | Admitting: Nurse Practitioner

## 2022-11-29 ENCOUNTER — Inpatient Hospital Stay: Payer: 59 | Admitting: Nurse Practitioner

## 2022-11-29 ENCOUNTER — Other Ambulatory Visit: Payer: Self-pay | Admitting: Nurse Practitioner

## 2022-11-29 ENCOUNTER — Inpatient Hospital Stay: Payer: 59 | Attending: Oncology

## 2022-11-29 VITALS — BP 120/79 | HR 59

## 2022-11-29 VITALS — BP 135/70 | HR 60 | Temp 98.2°F | Resp 18 | Ht 62.0 in | Wt 168.5 lb

## 2022-11-29 DIAGNOSIS — M8588 Other specified disorders of bone density and structure, other site: Secondary | ICD-10-CM | POA: Insufficient documentation

## 2022-11-29 DIAGNOSIS — F32A Depression, unspecified: Secondary | ICD-10-CM | POA: Insufficient documentation

## 2022-11-29 DIAGNOSIS — C9000 Multiple myeloma not having achieved remission: Secondary | ICD-10-CM | POA: Diagnosis not present

## 2022-11-29 DIAGNOSIS — Z85820 Personal history of malignant melanoma of skin: Secondary | ICD-10-CM | POA: Insufficient documentation

## 2022-11-29 LAB — CBC WITH DIFFERENTIAL (CANCER CENTER ONLY)
Abs Immature Granulocytes: 0.01 10*3/uL (ref 0.00–0.07)
Basophils Absolute: 0 10*3/uL (ref 0.0–0.1)
Basophils Relative: 2 %
Eosinophils Absolute: 0.1 10*3/uL (ref 0.0–0.5)
Eosinophils Relative: 5 %
HCT: 42 % (ref 36.0–46.0)
Hemoglobin: 14.5 g/dL (ref 12.0–15.0)
Immature Granulocytes: 0 %
Lymphocytes Relative: 29 %
Lymphs Abs: 0.8 10*3/uL (ref 0.7–4.0)
MCH: 31.5 pg (ref 26.0–34.0)
MCHC: 34.5 g/dL (ref 30.0–36.0)
MCV: 91.3 fL (ref 80.0–100.0)
Monocytes Absolute: 0.4 10*3/uL (ref 0.1–1.0)
Monocytes Relative: 14 %
Neutro Abs: 1.3 10*3/uL — ABNORMAL LOW (ref 1.7–7.7)
Neutrophils Relative %: 50 %
Platelet Count: 237 10*3/uL (ref 150–400)
RBC: 4.6 MIL/uL (ref 3.87–5.11)
RDW: 13.2 % (ref 11.5–15.5)
WBC Count: 2.7 10*3/uL — ABNORMAL LOW (ref 4.0–10.5)
nRBC: 0 % (ref 0.0–0.2)

## 2022-11-29 LAB — CMP (CANCER CENTER ONLY)
ALT: 19 U/L (ref 0–44)
AST: 16 U/L (ref 15–41)
Albumin: 4.4 g/dL (ref 3.5–5.0)
Alkaline Phosphatase: 30 U/L — ABNORMAL LOW (ref 38–126)
Anion gap: 9 (ref 5–15)
BUN: 19 mg/dL (ref 6–20)
CO2: 27 mmol/L (ref 22–32)
Calcium: 9.5 mg/dL (ref 8.9–10.3)
Chloride: 102 mmol/L (ref 98–111)
Creatinine: 0.76 mg/dL (ref 0.44–1.00)
GFR, Estimated: >60 mL/min (ref >60)
Glucose, Bld: 104 mg/dL — ABNORMAL HIGH (ref 70–99)
Potassium: 4.2 mmol/L (ref 3.5–5.1)
Sodium: 138 mmol/L (ref 135–145)
Total Bilirubin: 0.4 mg/dL (ref 0.3–1.2)
Total Protein: 6.8 g/dL (ref 6.5–8.1)

## 2022-11-29 MED ORDER — ZOLEDRONIC ACID 4 MG/100ML IV SOLN
4.0000 mg | Freq: Once | INTRAVENOUS | Status: AC
  Administered 2022-11-29: 4 mg via INTRAVENOUS
  Filled 2022-11-29: qty 100

## 2022-11-29 MED ORDER — SODIUM CHLORIDE 0.9 % IV SOLN: INTRAVENOUS | Status: DC

## 2022-11-29 MED ORDER — DIAZEPAM 5 MG PO TABS
5.0000 mg | ORAL_TABLET | Freq: Two times a day (BID) | ORAL | 0 refills | Status: DC | PRN
Start: 2022-11-29 — End: 2023-03-01

## 2022-11-29 MED ORDER — OXYCODONE HCL 5 MG PO TABS
5.0000 mg | ORAL_TABLET | Freq: Four times a day (QID) | ORAL | 0 refills | Status: DC | PRN
Start: 2022-11-29 — End: 2022-12-29

## 2022-11-29 NOTE — Progress Notes (Signed)
[facility] OFFICE PROGRESS NOTE   Diagnosis: Multiple myeloma  INTERVAL HISTORY:   Ms. Voskuil returns as scheduled.  She began a new cycle of Revlimid 11/24/2022.  She denies nausea/vomiting.  No mouth sores.  No diarrhea.  No rash.  She continues to have back pain, level varies.  For the past 2 weeks she has noted bilateral shoulder pain with specific movements that typically involve reaching back.  She specifically notes pain with certain swimming strokes and reaching for the seatbelt.  She began lifting weights a few months ago, 1, 3 and 5 pounds.  Objective:  Vital signs in last 24 hours:  Blood pressure 135/70, pulse 60, temperature 98.2 F (36.8 C), temperature source Oral, resp. rate 18, height 5\' 2"  (1.575 m), weight 168 lb 8 oz (76.4 kg), last menstrual period 03/06/2011, SpO2 100 %.    HEENT: No thrush or ulcers. Resp: Lungs clear bilaterally. Cardio: Regular rate and rhythm. GI: No hepatosplenomegaly. Vascular: No leg edema. Neuro: Upper extremity motor strength is intact. Musculoskeletal: Focal tenderness bilateral anterior shoulder.  Range of motion seems limited due to pain. Skin: No rash.   Lab Results:  Lab Results  Component Value Date   WBC 2.7 (L) 11/29/2022   HGB 14.5 11/29/2022   HCT 42.0 11/29/2022   MCV 91.3 11/29/2022   PLT 237 11/29/2022   NEUTROABS 1.3 (L) 11/29/2022    Imaging:  No results found.  Medications: I have reviewed the patient's current medications.  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 Increase serum free lambda light chains Serum M spike MRI pelvis 09/01/2020-innumerable small foci of signal abnormality concerning for multiple myeloma involvement Metastatic bone survey 09/03/2020-no discrete lytic bone lesions, compression fractures at  T12, L1, L2, and L3, osteopenia Cycle 1 RVD 09/14/2020 Daratumumab beginning 09/28/2020 Cycle 2 RVD 10/05/2020 plus daratumumab Week 3 daratumumab 10/12/2020 Normal lambda light chains 10/26/2020 Cycle 3 RVD plus daratumumab 10/26/2020 Cycle 4 RVD plus daratumumab 11/16/2020, daratumumab changed to an every 2-week schedule beginning 11/23/2020 Cycle 5 RVD plus daratumumab 12/07/2020 Cycle 6 RVD plus daratumumab 12/28/2020 Normal lambda light chains 01/05/2021 Revlimid maintenance 21/28 days beginning 01/18/2021 Stem cell harvest at [facility] 03/29/2021 Revlimid resumed 03/30/2021   2. Severe back pain MRI lumbar spine July 12, 2020-subacute compression fractures at T12 and L1 MRI lumbar spine July 30, 2020-subacute T12 and L1 compression fractures, new subacute superior L2 endplate deformity MRI lumbar spine September 01, 2020 new/progressive compression fractures at L2 and L3, acute compression fracture at L4, evolution of T12 and L1 compression fractures MRI pelvis September 01, 2020-innumerable small foci of signal abnormality in the pelvis and femurs, no fracture identified, concerning for multiple myeloma versus metastases Zometa beginning 09/21/2020-plan monthly x3 then every 3 months, next due 11/19/2021 MRI thoracic spine July 2022-new T3 compression fracture with multiple additional thoracic compression fractures 3. Melanoma left upper back 2014 4. History of diverticulitis 5. Mild hypercalcemia at presentation-resolved 6. Osteoporosis 7. Evusheld 8.  Depression-worsened depression symptoms 01/11/2021, referred to psychology      Disposition: Ms. Zaniewski appears stable.  She will continue maintenance Revlimid.  We will follow-up on the light chains from today.  CBC and chemistry panel reviewed.  Labs adequate to continue Revlimid as above.  She has stable mild neutropenia.  She will receive Zometa today as scheduled.  Etiology of the bilateral anterior shoulder  pain with certain motions is unclear.   Question if the pain may be related to her exercise regimen.  She will follow-up with Dr. Despina Hick.  She would like to return for lab and follow-up in 8 weeks.  We are available to see her sooner if needed.    Lonna Cobb ANP/GNP-BC   11/29/2022  9:05 AM

## 2022-11-29 NOTE — Patient Instructions (Signed)

## 2022-11-29 NOTE — Progress Notes (Signed)
Patient seen by Lisa Thomas NP today  Vitals are within treatment parameters.  Labs reviewed by Lisa Thomas NP and are within treatment parameters.  Per physician team, patient is ready for treatment and there are NO modifications to the treatment plan.     

## 2022-11-30 LAB — KAPPA/LAMBDA LIGHT CHAINS
Kappa free light chain: 11.9 mg/L (ref 3.3–19.4)
Kappa, lambda light chain ratio: 1.03 (ref 0.26–1.65)
Lambda free light chains: 11.5 mg/L (ref 5.7–26.3)

## 2022-12-01 ENCOUNTER — Telehealth: Payer: Self-pay

## 2022-12-01 NOTE — Telephone Encounter (Signed)
-----   Message from Rana Snare, NP sent at 12/01/2022 11:00 AM EDT ----- Please let her know light chains are stable in normal range.  Follow-up as scheduled.

## 2022-12-01 NOTE — Telephone Encounter (Signed)
I left a message on the patient's voicemail indicating that her test results show stable and normal levels. If she has any questions, she is welcome to contact our office.

## 2022-12-14 ENCOUNTER — Other Ambulatory Visit: Payer: Self-pay | Admitting: *Deleted

## 2022-12-14 ENCOUNTER — Ambulatory Visit: Payer: 59 | Admitting: Nurse Practitioner

## 2022-12-14 ENCOUNTER — Other Ambulatory Visit: Payer: Self-pay | Admitting: Oncology

## 2022-12-14 DIAGNOSIS — C9000 Multiple myeloma not having achieved remission: Secondary | ICD-10-CM

## 2022-12-15 ENCOUNTER — Other Ambulatory Visit (INDEPENDENT_AMBULATORY_CARE_PROVIDER_SITE_OTHER): Payer: Self-pay | Admitting: Family Medicine

## 2022-12-15 ENCOUNTER — Other Ambulatory Visit: Payer: Self-pay | Admitting: Oncology

## 2022-12-15 ENCOUNTER — Ambulatory Visit (INDEPENDENT_AMBULATORY_CARE_PROVIDER_SITE_OTHER): Payer: 59 | Admitting: Family Medicine

## 2022-12-15 ENCOUNTER — Encounter (INDEPENDENT_AMBULATORY_CARE_PROVIDER_SITE_OTHER): Payer: Self-pay | Admitting: Family Medicine

## 2022-12-15 VITALS — BP 124/76 | HR 44 | Temp 98.1°F | Ht 62.0 in | Wt 163.0 lb

## 2022-12-15 DIAGNOSIS — E559 Vitamin D deficiency, unspecified: Secondary | ICD-10-CM

## 2022-12-15 DIAGNOSIS — E669 Obesity, unspecified: Secondary | ICD-10-CM | POA: Diagnosis not present

## 2022-12-15 DIAGNOSIS — R632 Polyphagia: Secondary | ICD-10-CM | POA: Diagnosis not present

## 2022-12-15 DIAGNOSIS — Z6829 Body mass index (BMI) 29.0-29.9, adult: Secondary | ICD-10-CM

## 2022-12-15 DIAGNOSIS — C9000 Multiple myeloma not having achieved remission: Secondary | ICD-10-CM

## 2022-12-15 MED ORDER — ZEPBOUND 2.5 MG/0.5ML ~~LOC~~ SOAJ
2.5000 mg | SUBCUTANEOUS | 0 refills | Status: DC
Start: 2022-12-15 — End: 2023-01-16

## 2022-12-15 NOTE — Assessment & Plan Note (Signed)
Outpatient and Clinic-Administered Medications Managed by oncology with an upcoming visit scheduled at [facility].  She has had a flareup of back pain with multiple level vertebral fractures.  This has limited her physical activity between visits which has affected her weight.  She is driving to see additional weight loss especially around her midsection to help relieve her back pain.   oxyCODONE (OXY IR/ROXICODONE) 5 MG immediate release tablet 5-10 mg, Every 6 hours PRN      methocarbamol (ROBAXIN) 500 MG tablet       loratadine (CLARITIN) 10 MG tablet 10 mg, Daily      lenalidomide (REVLIMID) 10 MG capsule 10 mg, Daily      Ibuprofen 200 MG CAPS 600 mg, As needed      diazepam (VALIUM) 5 MG tablet 5 mg, 2 times daily PRN      acyclovir (ZOVIRAX) 400 MG tablet       acetaminophen (TYLENOL) 500 MG tablet

## 2022-12-15 NOTE — Progress Notes (Signed)
Office: [phone]  /  Fax: [phone]  WEIGHT SUMMARY AND BIOMETRICS  Starting Date: 07/13/22  Starting Weight: 172 lb   Weight Lost Since Last Visit: 0   Vitals Temp: 98.1 F (36.7 C) BP: 124/76 Pulse Rate: (!) 44 SpO2: 100 %   Body Composition  Body Fat %: 36.2 % Fat Mass (lbs): 59.2 lbs Muscle Mass (lbs): 99.2 lbs Total Body Water (lbs): 68 lbs Visceral Fat Rating : 9     HPI  Chief Complaint: OBESITY  Katherine Beard is here to discuss her progress with her obesity treatment plan. She is on the the Category 1 Plan and states she is following her eating plan approximately 0 % of the time. She states she is exercising walking 20-30 minutes 3-4 times per week.   Interval History:  Since last office visit she is up 2 lb This gives her a net weight loss of 9 pounds in the past 5 months of medically supervised weight management She gained 1.2 pounds of muscle mass and gained 1.2 pounds of body fat in the past 3 months She had to limit exercise due to a flare up of back pain due to fractures from multiple myeloma At her last visit 3 mos ago, she changed Lomaira 8 mg bid to Phentermine 15 mg in the morning but she did not see any changes in appetite.  She did see a rebound of hunger off medication. She plans to resume swimming and gym workouts. She plans to resume her dietary plan  Pharmacotherapy: None  PHYSICAL EXAM:  Blood pressure 124/76, pulse (!) 44, temperature 98.1 F (36.7 C), height 5\' 2"  (1.575 m), weight 163 lb (73.9 kg), last menstrual period 03/06/2011, SpO2 100 %. Body mass index is 29.81 kg/m.  General: She is overweight, cooperative, alert, well developed, and in no acute distress. PSYCH: Has normal mood, affect and thought process.   Lungs: Normal breathing effort, no conversational dyspnea.   ASSESSMENT AND PLAN  TREATMENT PLAN FOR OBESITY:  Recommended Dietary Goals  [name] is currently in the action stage of change. As such, her goal is  to continue weight management plan. She has agreed to the Category 1 Plan.  Behavioral Intervention  We discussed the following Behavioral Modification Strategies today: increasing lean protein intake, decreasing simple carbohydrates , increasing vegetables, increasing lower glycemic fruits, increasing fiber rich foods, avoiding skipping meals, increasing water intake, keeping healthy foods at home, continue to practice mindfulness when eating, and planning for success.  Additional resources provided today: NA  Recommended Physical Activity Goals  [name] has been advised to work up to 150 minutes of moderate intensity aerobic activity a week and strengthening exercises 2-3 times per week for cardiovascular health, weight loss maintenance and preservation of muscle mass.   She has agreed to [employer] strengthening exercises with a goal of 2-3 sessions a week   Pharmacotherapy changes for the treatment of obesity: Zepbound 2.5 mg weekly (BMI 29 with HLD)  ASSOCIATED CONDITIONS ADDRESSED TODAY  Polyphagia Assessment & Plan: She has struggled more with snacking and increased appetite due to an increase in back pain between visits.  She failed to see improved appetite control or much weight reduction using Lomaira 8 mg twice daily or phentermine 15 mg once daily.  We discussed the use of a GLP-1/GLP/GIP receptor agonist therapy for obesity management.  She denies a personal or Family history for pancreatitis, medullary thyroid carcinoma or multiple endocrine neoplasia type II.  She is doing a good job of  eating on a schedule, getting in lean protein and fiber with meals to help with appetite control.  Begin Zepbound 2.5 mg weekly.  Follow-up in 3 to 4 weeks.  Orders: -     Zepbound; Inject 2.5 mg into the skin once a week.  Dispense: 2 mL; Refill: 0  BMI 29.0-29.9,adult  Generalized obesity with starting BMI 31  Multiple myeloma, remission status unspecified (HCC) Assessment &  Plan: Outpatient and Clinic-Administered Medications Managed by oncology with an upcoming visit scheduled at [facility].  She has had a flareup of back pain with multiple level vertebral fractures.  This has limited her physical activity between visits which has affected her weight.  She is driving to see additional weight loss especially around her midsection to help relieve her back pain.   oxyCODONE (OXY IR/ROXICODONE) 5 MG immediate release tablet 5-10 mg, Every 6 hours PRN      methocarbamol (ROBAXIN) 500 MG tablet       loratadine (CLARITIN) 10 MG tablet 10 mg, Daily      lenalidomide (REVLIMID) 10 MG capsule 10 mg, Daily      Ibuprofen 200 MG CAPS 600 mg, As needed      diazepam (VALIUM) 5 MG tablet 5 mg, 2 times daily PRN      acyclovir (ZOVIRAX) 400 MG tablet       acetaminophen (TYLENOL) 500 MG tablet       Vitamin D deficiency Assessment & Plan: Last vitamin D Lab Results  Component Value Date   VD25OH 29.6 (L) 07/13/2022   She has been taking over-the-counter vitamin D 5000 IU once daily.  Her energy level is stable.  Recheck vitamin D level next visit.       She was informed of the importance of frequent follow up visits to maximize her success with intensive lifestyle modifications for her multiple health conditions.   ATTESTASTION STATEMENTS:  Reviewed by clinician on day of visit: allergies, medications, problem list, medical history, surgical history, family history, social history, and previous encounter notes pertinent to obesity diagnosis.   I have personally spent 30 minutes total time today in preparation, patient care, nutritional counseling and documentation for this visit, including the following: review of clinical lab tests; review of medical tests/procedures/services.      Glennis Brink, DO DABFM, DABOM [facility] [address] [place], [place] [address] [phone]

## 2022-12-15 NOTE — Assessment & Plan Note (Signed)
She has struggled more with snacking and increased appetite due to an increase in back pain between visits.  She failed to see improved appetite control or much weight reduction using Lomaira 8 mg twice daily or phentermine 15 mg once daily.  We discussed the use of a GLP-1/GLP/GIP receptor agonist therapy for obesity management.  She denies a personal or Family history for pancreatitis, medullary thyroid carcinoma or multiple endocrine neoplasia type II.  She is doing a good job of eating on a schedule, getting in lean protein and fiber with meals to help with appetite control.  Begin Zepbound 2.5 mg weekly.  Follow-up in 3 to 4 weeks.

## 2022-12-15 NOTE — Telephone Encounter (Signed)
6.12 Patient stated that if her pharamacy doesnt have the zepbound she is okay with calling the prescription in somewhere else. Katherine Beard

## 2022-12-15 NOTE — Assessment & Plan Note (Signed)
Last vitamin D Lab Results  Component Value Date   VD25OH 29.6 (L) 07/13/2022   She has been taking over-the-counter vitamin D 5000 IU once daily.  Her energy level is stable.  Recheck vitamin D level next visit.

## 2022-12-16 ENCOUNTER — Other Ambulatory Visit: Payer: Self-pay | Admitting: Oncology

## 2022-12-16 DIAGNOSIS — C9000 Multiple myeloma not having achieved remission: Secondary | ICD-10-CM

## 2022-12-16 MED ORDER — LENALIDOMIDE 10 MG PO CAPS
10.0000 mg | ORAL_CAPSULE | Freq: Every day | ORAL | 0 refills | Status: DC
Start: 2022-12-16 — End: 2023-01-10

## 2022-12-20 ENCOUNTER — Telehealth (INDEPENDENT_AMBULATORY_CARE_PROVIDER_SITE_OTHER): Payer: Self-pay | Admitting: Family Medicine

## 2022-12-20 ENCOUNTER — Other Ambulatory Visit (INDEPENDENT_AMBULATORY_CARE_PROVIDER_SITE_OTHER): Payer: Self-pay | Admitting: Family Medicine

## 2022-12-20 ENCOUNTER — Encounter (INDEPENDENT_AMBULATORY_CARE_PROVIDER_SITE_OTHER): Payer: Self-pay

## 2022-12-20 DIAGNOSIS — R632 Polyphagia: Secondary | ICD-10-CM

## 2022-12-20 NOTE — Telephone Encounter (Signed)
Prior authorization done via cover my meds for patients Zepbound. Waiting on determination.   

## 2022-12-21 NOTE — Telephone Encounter (Signed)
Patients Zepbound was denied. Per her insurance they do not cover medications for weight loss or to decrease appetite.

## 2022-12-29 ENCOUNTER — Other Ambulatory Visit: Payer: Self-pay | Admitting: Nurse Practitioner

## 2022-12-29 DIAGNOSIS — C9000 Multiple myeloma not having achieved remission: Secondary | ICD-10-CM

## 2022-12-29 MED ORDER — OXYCODONE HCL 5 MG PO TABS
5.0000 mg | ORAL_TABLET | Freq: Four times a day (QID) | ORAL | 0 refills | Status: DC | PRN
Start: 2022-12-29 — End: 2023-01-23

## 2023-01-10 ENCOUNTER — Other Ambulatory Visit: Payer: Self-pay | Admitting: Oncology

## 2023-01-10 DIAGNOSIS — C9000 Multiple myeloma not having achieved remission: Secondary | ICD-10-CM

## 2023-01-12 ENCOUNTER — Other Ambulatory Visit: Payer: Self-pay | Admitting: Oncology

## 2023-01-12 DIAGNOSIS — C9000 Multiple myeloma not having achieved remission: Secondary | ICD-10-CM

## 2023-01-12 NOTE — Telephone Encounter (Signed)
Refill was already sent on 01/10/23 and is in process

## 2023-01-16 ENCOUNTER — Ambulatory Visit (INDEPENDENT_AMBULATORY_CARE_PROVIDER_SITE_OTHER): Payer: 59 | Admitting: Family Medicine

## 2023-01-16 ENCOUNTER — Encounter (INDEPENDENT_AMBULATORY_CARE_PROVIDER_SITE_OTHER): Payer: Self-pay | Admitting: Family Medicine

## 2023-01-16 VITALS — BP 119/80 | HR 54 | Temp 98.1°F | Ht 62.0 in | Wt 158.0 lb

## 2023-01-16 DIAGNOSIS — E669 Obesity, unspecified: Secondary | ICD-10-CM | POA: Diagnosis not present

## 2023-01-16 DIAGNOSIS — E785 Hyperlipidemia, unspecified: Secondary | ICD-10-CM

## 2023-01-16 DIAGNOSIS — E559 Vitamin D deficiency, unspecified: Secondary | ICD-10-CM | POA: Diagnosis not present

## 2023-01-16 DIAGNOSIS — Z6828 Body mass index (BMI) 28.0-28.9, adult: Secondary | ICD-10-CM

## 2023-01-16 DIAGNOSIS — R632 Polyphagia: Secondary | ICD-10-CM

## 2023-01-16 MED ORDER — ZEPBOUND 2.5 MG/0.5ML ~~LOC~~ SOAJ
2.5000 mg | SUBCUTANEOUS | 0 refills | Status: DC
Start: 2023-01-16 — End: 2023-02-14

## 2023-01-16 NOTE — Assessment & Plan Note (Signed)
Improved on Zepbound 2.5 mg weekly injection. Has seen a reduction in food noise Logging intake of food and drink on the lose it app.  We discussed a target goal of 1200 to 1400 cal/day which should include 75 to 90 g of protein intake daily  Continue Zepbound 2.5 mg once weekly

## 2023-01-16 NOTE — Assessment & Plan Note (Signed)
Lab Results  Component Value Date   CHOL 225 (H) 07/13/2022   HDL 63 07/13/2022   LDLCALC 132 (H) 07/13/2022   LDLDIRECT 173.7 11/26/2010   TRIG 171 (H) 07/13/2022   CHOLHDL 3.6 07/13/2022   She has lost 8.1% total body weight loss in the past 6 months of medically supervised weight management using a low saturated fat diet.  She is currently not on any lipid-lowering medications.  Will recheck fasting lipid panel today.

## 2023-01-16 NOTE — Assessment & Plan Note (Signed)
Last vitamin D Lab Results  Component Value Date   VD25OH 29.6 (L) 07/13/2022   She has been taking over-the-counter vitamin D 4000 IU once daily.  Energy level is improving.  Recheck vitamin D level today

## 2023-01-16 NOTE — Progress Notes (Signed)
Office: [phone]  /  Fax: [phone]  WEIGHT SUMMARY AND BIOMETRICS  Starting Date: 07/13/22  Starting Weight: 172lb   Weight Lost Since Last Visit: 5lb   Vitals Temp: 98.1 F (36.7 C) BP: 119/80 Pulse Rate: (!) 54 SpO2: 100 %   Body Composition  Body Fat %: 36 % Fat Mass (lbs): 57.2 lbs Muscle Mass (lbs): 96.4 lbs Total Body Water (lbs): 67.8 lbs Visceral Fat Rating : 9     HPI  Chief Complaint: OBESITY  Katherine Beard is here to discuss her progress with her obesity treatment plan. She is on the keeping a food journal and adhering to recommended goals of 1200 calories and 90 protein and states she is following her eating plan approximately 90 % of the time. She states she is walking and weights 30 minutes 5 times per week.   Interval History:  Since last office visit she is down 5 lb She has seen a reduction of muscle mass but is doing weight training 3-5 times a week and has started tracking her calories and protein intake Denies meal skipping with the addition of tirzepatide She is logging everything on the Lose[employer] Ap She has a smart scale to track at home She has done Zepbound 2.5 mg injection x 4 wks  Initially had some nausea but this has improved She has had to travel for work and has vacations coming up She has a net weight loss of 14 pounds in the past 6 months  Pharmacotherapy: Zepbound 2.5 mg weekly  PHYSICAL EXAM:  Blood pressure 119/80, pulse (!) 54, temperature 98.1 F (36.7 C), height 5\' 2"  (1.575 m), weight 158 lb (71.7 kg), last menstrual period 03/06/2011, SpO2 100%. Body mass index is 28.9 kg/m.  General: She is overweight, cooperative, alert, well developed, and in no acute distress. PSYCH: Has normal mood, affect and thought process.   Lungs: Normal breathing effort, no conversational dyspnea.   ASSESSMENT AND PLAN  TREATMENT PLAN FOR OBESITY:  Recommended Dietary Goals  Katherine Beard is currently in the action stage of change. As such,  her goal is to continue weight management plan. She has agreed to keeping a food journal and adhering to recommended goals of 1200-1400 calories and 75- 90 g of protein.  Behavioral Intervention  We discussed the following Behavioral Modification Strategies today: increasing lean protein intake, decreasing simple carbohydrates , increasing vegetables, increasing lower glycemic fruits, increasing fiber rich foods, avoiding skipping meals, increasing water intake, keeping healthy foods at home, continue to practice mindfulness when eating, and planning for success.  Additional resources provided today: NA  Recommended Physical Activity Goals  Katherine Beard has been advised to work up to 150 minutes of moderate intensity aerobic activity a week and strengthening exercises 2-3 times per week for cardiovascular health, weight loss maintenance and preservation of muscle mass.   She has agreed to Work on scheduling and tracking physical activity.   Pharmacotherapy changes for the treatment of obesity: None  ASSOCIATED CONDITIONS ADDRESSED TODAY  Polyphagia Assessment & Plan: Improved on Zepbound 2.5 mg weekly injection. Has seen a reduction in food noise Logging intake of food and drink on the lose it app.  We discussed a target goal of 1200 to 1400 cal/day which should include 75 to 90 g of protein intake daily  Continue Zepbound 2.5 mg once weekly  Orders: -     Zepbound; Inject 2.5 mg into the skin once a week.  Dispense: 2 mL; Refill: 0  Vitamin D deficiency Assessment &  Plan: Last vitamin D Lab Results  Component Value Date   VD25OH 29.6 (L) 07/13/2022   She has been taking over-the-counter vitamin D 4000 IU once daily.  Energy level is improving.  Recheck vitamin D level today  Orders: -     VITAMIN D 25 Hydroxy (Vit-D Deficiency, Fractures)  Hyperlipidemia, unspecified hyperlipidemia type Assessment & Plan: Lab Results  Component Value Date   CHOL 225 (H) 07/13/2022   HDL 63  07/13/2022   LDLCALC 132 (H) 07/13/2022   LDLDIRECT 173.7 11/26/2010   TRIG 171 (H) 07/13/2022   CHOLHDL 3.6 07/13/2022   She has lost 8.1% total body weight loss in the past 6 months of medically supervised weight management using a low saturated fat diet.  She is currently not on any lipid-lowering medications.  Will recheck fasting lipid panel today.    Orders: -     Lipid panel  Generalized obesity with starting BMI 31  BMI 28.0-28.9,adult      She was informed of the importance of frequent follow up visits to maximize her success with intensive lifestyle modifications for her multiple health conditions.   ATTESTASTION STATEMENTS:  Reviewed by clinician on day of visit: allergies, medications, problem list, medical history, surgical history, family history, social history, and previous encounter notes pertinent to obesity diagnosis.   I have personally spent 30 minutes total time today in preparation, patient care, nutritional counseling and documentation for this visit, including the following: review of clinical lab tests; review of medical tests/procedures/services.      Glennis Brink, DO DABFM, DABOM [facility] [address] [place], [place] [address] [phone]

## 2023-01-17 LAB — VITAMIN D 25 HYDROXY (VIT D DEFICIENCY, FRACTURES): Vit D, 25-Hydroxy: 77.3 ng/mL (ref 30.0–100.0)

## 2023-01-17 LAB — LIPID PANEL
Chol/HDL Ratio: 3.4 ratio (ref 0.0–4.4)
Cholesterol, Total: 224 mg/dL — ABNORMAL HIGH (ref 100–199)
HDL: 65 mg/dL (ref >39)
LDL Chol Calc (NIH): 142 mg/dL — ABNORMAL HIGH (ref 0–99)
Triglycerides: 95 mg/dL (ref 0–149)
VLDL Cholesterol Cal: 17 mg/dL (ref 5–40)

## 2023-01-21 ENCOUNTER — Other Ambulatory Visit: Payer: Self-pay | Admitting: Nurse Practitioner

## 2023-01-21 DIAGNOSIS — C9000 Multiple myeloma not having achieved remission: Secondary | ICD-10-CM

## 2023-01-23 ENCOUNTER — Other Ambulatory Visit: Payer: Self-pay | Admitting: Nurse Practitioner

## 2023-01-23 DIAGNOSIS — C9000 Multiple myeloma not having achieved remission: Secondary | ICD-10-CM

## 2023-01-23 MED ORDER — OXYCODONE HCL 5 MG PO TABS
5.0000 mg | ORAL_TABLET | Freq: Four times a day (QID) | ORAL | 0 refills | Status: DC | PRN
Start: 2023-01-23 — End: 2023-02-10

## 2023-01-30 ENCOUNTER — Inpatient Hospital Stay: Payer: 59 | Attending: Oncology

## 2023-01-30 ENCOUNTER — Inpatient Hospital Stay: Payer: 59 | Admitting: Oncology

## 2023-01-30 VITALS — BP 120/78 | HR 64 | Temp 98.1°F | Resp 18 | Ht 62.0 in | Wt 160.9 lb

## 2023-01-30 DIAGNOSIS — C9000 Multiple myeloma not having achieved remission: Secondary | ICD-10-CM

## 2023-01-30 DIAGNOSIS — M549 Dorsalgia, unspecified: Secondary | ICD-10-CM | POA: Diagnosis not present

## 2023-01-30 DIAGNOSIS — F32A Depression, unspecified: Secondary | ICD-10-CM | POA: Diagnosis not present

## 2023-01-30 DIAGNOSIS — M25559 Pain in unspecified hip: Secondary | ICD-10-CM | POA: Diagnosis not present

## 2023-01-30 DIAGNOSIS — Z85820 Personal history of malignant melanoma of skin: Secondary | ICD-10-CM | POA: Insufficient documentation

## 2023-01-30 DIAGNOSIS — M810 Age-related osteoporosis without current pathological fracture: Secondary | ICD-10-CM | POA: Diagnosis not present

## 2023-01-30 LAB — CBC WITH DIFFERENTIAL (CANCER CENTER ONLY)
Abs Immature Granulocytes: 0 10*3/uL (ref 0.00–0.07)
Basophils Absolute: 0 10*3/uL (ref 0.0–0.1)
Basophils Relative: 2 %
Eosinophils Absolute: 0.1 10*3/uL (ref 0.0–0.5)
Eosinophils Relative: 3 %
HCT: 39 % (ref 36.0–46.0)
Hemoglobin: 13.4 g/dL (ref 12.0–15.0)
Immature Granulocytes: 0 %
Lymphocytes Relative: 25 %
Lymphs Abs: 0.6 10*3/uL — ABNORMAL LOW (ref 0.7–4.0)
MCH: 31.4 pg (ref 26.0–34.0)
MCHC: 34.4 g/dL (ref 30.0–36.0)
MCV: 91.3 fL (ref 80.0–100.0)
Monocytes Absolute: 0.2 10*3/uL (ref 0.1–1.0)
Monocytes Relative: 9 %
Neutro Abs: 1.5 10*3/uL — ABNORMAL LOW (ref 1.7–7.7)
Neutrophils Relative %: 61 %
Platelet Count: 207 10*3/uL (ref 150–400)
RBC: 4.27 MIL/uL (ref 3.87–5.11)
RDW: 13.2 % (ref 11.5–15.5)
WBC Count: 2.5 10*3/uL — ABNORMAL LOW (ref 4.0–10.5)
nRBC: 0 % (ref 0.0–0.2)

## 2023-01-30 LAB — CMP (CANCER CENTER ONLY)
ALT: 17 U/L (ref 0–44)
AST: 15 U/L (ref 15–41)
Albumin: 4.3 g/dL (ref 3.5–5.0)
Alkaline Phosphatase: 19 U/L — ABNORMAL LOW (ref 38–126)
Anion gap: 6 (ref 5–15)
BUN: 17 mg/dL (ref 6–20)
CO2: 29 mmol/L (ref 22–32)
Calcium: 9.1 mg/dL (ref 8.9–10.3)
Chloride: 102 mmol/L (ref 98–111)
Creatinine: 0.86 mg/dL (ref 0.44–1.00)
GFR, Estimated: >60 mL/min (ref >60)
Glucose, Bld: 98 mg/dL (ref 70–99)
Potassium: 3.9 mmol/L (ref 3.5–5.1)
Sodium: 137 mmol/L (ref 135–145)
Total Bilirubin: 0.5 mg/dL (ref 0.3–1.2)
Total Protein: 6.3 g/dL — ABNORMAL LOW (ref 6.5–8.1)

## 2023-01-30 NOTE — Progress Notes (Signed)
Katherine Beard OFFICE PROGRESS NOTE   Diagnosis: Multiple myeloma  INTERVAL HISTORY:   Katherine Beard returns as scheduled.  She began another cycle of Revlimid on 01/19/2023.  No rash, nausea, diarrhea, or neuropathy symptoms.  She continues to have back and hip pain.  She takes oxycodone approximately twice daily. She saw Katherine Beard earlier this month.  The serum free light chains are normal.  A serum immunofixation was negative.  Objective:  Vital signs in last 24 hours:  Blood pressure 120/78, pulse 64, temperature 98.1 F (36.7 C), temperature source Oral, resp. rate 18, height 5\' 2"  (1.575 m), weight 160 lb 14.4 oz (73 kg), last menstrual period 03/06/2011, SpO2 100%.    HEENT: No thrush or ulcers Resp: Lungs clear bilaterally Cardio: Regular rate and rhythm GI: No hepatosplenomegaly Vascular: No leg edema  Lab Results:  Lab Results  Component Value Date   WBC 2.5 (L) 01/30/2023   HGB 13.4 01/30/2023   HCT 39.0 01/30/2023   MCV 91.3 01/30/2023   PLT 207 01/30/2023   NEUTROABS 1.5 (L) 01/30/2023    CMP  Lab Results  Component Value Date   NA 137 01/30/2023   K 3.9 01/30/2023   CL 102 01/30/2023   CO2 29 01/30/2023   GLUCOSE 98 01/30/2023   BUN 17 01/30/2023   CREATININE 0.86 01/30/2023   CALCIUM 9.1 01/30/2023   PROT 6.3 (L) 01/30/2023   ALBUMIN 4.3 01/30/2023   AST 15 01/30/2023   ALT 17 01/30/2023   ALKPHOS 19 (L) 01/30/2023   BILITOT 0.5 01/30/2023   GFRNONAA >60 01/30/2023   GFRAA 78 12/11/2006    Medications: I have reviewed the patient's current medications.   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 Increase serum free lambda light chains Serum M spike MRI pelvis 09/01/2020-innumerable small foci of signal abnormality concerning for multiple myeloma  involvement Metastatic bone survey 09/03/2020-no discrete lytic bone lesions, compression fractures at T12, L1, L2, and L3, osteopenia Cycle 1 RVD 09/14/2020 Daratumumab beginning 09/28/2020 Cycle 2 RVD 10/05/2020 plus daratumumab Week 3 daratumumab 10/12/2020 Normal lambda light chains 10/26/2020 Cycle 3 RVD plus daratumumab 10/26/2020 Cycle 4 RVD plus daratumumab 11/16/2020, daratumumab changed to an every 2-week schedule beginning 11/23/2020 Cycle 5 RVD plus daratumumab 12/07/2020 Cycle 6 RVD plus daratumumab 12/28/2020 Normal lambda light chains 01/05/2021 Revlimid maintenance 21/28 days beginning 01/18/2021 Stem cell harvest at [facility] 03/29/2021 Revlimid resumed 03/30/2021 01/04/2023 normal light chains and negative serum immunofixation at [name]   2. Severe back pain MRI lumbar spine July 12, 2020-subacute compression fractures at T12 and L1 MRI lumbar spine July 30, 2020-subacute T12 and L1 compression fractures, new subacute superior L2 endplate deformity MRI lumbar spine September 01, 2020 new/progressive compression fractures at L2 and L3, acute compression fracture at L4, evolution of T12 and L1 compression fractures MRI pelvis September 01, 2020-innumerable small foci of signal abnormality in the pelvis and femurs, no fracture identified, concerning for multiple myeloma versus metastases Zometa beginning 09/21/2020-plan monthly x3 then every 3 months, next due 11/19/2021 MRI thoracic spine July 2022-new T3 compression fracture with multiple additional thoracic compression fractures 3. Melanoma left upper back 2014 4. History of diverticulitis 5. Mild hypercalcemia at presentation-resolved 6. Osteoporosis 7. [place] 8.  Depression-worsened depression symptoms 01/11/2021, referred to psychology        Disposition: Katherine Beard appears stable.  She continues Revlimid  maintenance.  We will follow-up on the light chains from today.  A serum immunofixation was negative at [name]. She will  return for an office visit and Zometa in 1 month.  She will be referred for a repeat bone density scan.  Katherine Papas, MD  01/30/2023  9:03 AM

## 2023-01-31 ENCOUNTER — Encounter: Payer: Self-pay | Admitting: Internal Medicine

## 2023-01-31 NOTE — Addendum Note (Signed)
Addended by: Wandalee Ferdinand on: 01/31/2023 02:22 PM   Modules accepted: Orders

## 2023-02-07 ENCOUNTER — Other Ambulatory Visit: Payer: Self-pay | Admitting: Oncology

## 2023-02-07 DIAGNOSIS — C9000 Multiple myeloma not having achieved remission: Secondary | ICD-10-CM

## 2023-02-08 ENCOUNTER — Other Ambulatory Visit: Payer: Self-pay | Admitting: Oncology

## 2023-02-08 ENCOUNTER — Encounter: Payer: Self-pay | Admitting: Oncology

## 2023-02-08 DIAGNOSIS — C9000 Multiple myeloma not having achieved remission: Secondary | ICD-10-CM

## 2023-02-09 ENCOUNTER — Other Ambulatory Visit: Payer: Self-pay | Admitting: Oncology

## 2023-02-09 ENCOUNTER — Other Ambulatory Visit: Payer: Self-pay | Admitting: Nurse Practitioner

## 2023-02-09 DIAGNOSIS — C9000 Multiple myeloma not having achieved remission: Secondary | ICD-10-CM

## 2023-02-10 ENCOUNTER — Encounter: Payer: Self-pay | Admitting: *Deleted

## 2023-02-10 ENCOUNTER — Other Ambulatory Visit: Payer: Self-pay | Admitting: Nurse Practitioner

## 2023-02-10 DIAGNOSIS — C9000 Multiple myeloma not having achieved remission: Secondary | ICD-10-CM

## 2023-02-10 MED ORDER — OXYCODONE HCL 5 MG PO TABS: 5.0000 mg | ORAL_TABLET | Freq: Four times a day (QID) | ORAL | 0 refills | Status: DC | PRN

## 2023-02-14 ENCOUNTER — Ambulatory Visit (INDEPENDENT_AMBULATORY_CARE_PROVIDER_SITE_OTHER): Payer: 59 | Admitting: Family Medicine

## 2023-02-14 ENCOUNTER — Encounter (INDEPENDENT_AMBULATORY_CARE_PROVIDER_SITE_OTHER): Payer: Self-pay | Admitting: Family Medicine

## 2023-02-14 VITALS — BP 121/80 | HR 56 | Temp 98.6°F | Ht 62.0 in | Wt 155.0 lb

## 2023-02-14 DIAGNOSIS — E785 Hyperlipidemia, unspecified: Secondary | ICD-10-CM | POA: Diagnosis not present

## 2023-02-14 DIAGNOSIS — R632 Polyphagia: Secondary | ICD-10-CM

## 2023-02-14 DIAGNOSIS — E669 Obesity, unspecified: Secondary | ICD-10-CM

## 2023-02-14 DIAGNOSIS — Z6828 Body mass index (BMI) 28.0-28.9, adult: Secondary | ICD-10-CM

## 2023-02-14 DIAGNOSIS — E559 Vitamin D deficiency, unspecified: Secondary | ICD-10-CM

## 2023-02-14 MED ORDER — ZEPBOUND 5 MG/0.5ML ~~LOC~~ SOAJ
5.0000 mg | SUBCUTANEOUS | 0 refills | Status: DC
Start: 2023-02-14 — End: 2023-03-13

## 2023-02-14 NOTE — Progress Notes (Signed)
Office: [phone]  /  Fax: [phone]  WEIGHT SUMMARY AND BIOMETRICS  Starting Date: 07/13/22  Starting Weight: 172lb   Weight Lost Since Last Visit: 3lb   Vitals Temp: 98.6 F (37 C) BP: 121/80 Pulse Rate: (!) 56 SpO2: 99 %   Body Composition  Body Fat %: 34.8 % Fat Mass (lbs): 54 lbs Muscle Mass (lbs): 96.2 lbs Total Body Water (lbs): 66.4 lbs Visceral Fat Rating : 8     HPI  Chief Complaint: OBESITY  Nia is here to discuss her progress with her obesity treatment plan. She is on the keeping a food journal and adhering to recommended goals of 1200 calories and 90 protein and states she is following her eating plan approximately 95 % of the time. She states she is exercising 30-60 minutes 3-5 times per week.   Interval History:  Since last office visit she is down 3 lb She just celebrated her husband's birthday and was more active She will be going to [place] starting tomorrow and will be going to the beach She is down 0.2 lb of muscle mass and is down 3.2 lb of body fat in the past month She is down a total of 17 lb in the past 7 mos This is a 9.8% TBW loss Chronic back pain from multiple myeloma has improved a little with weight reduction from midsection She is working out at the gym 3-4 x a week She is on Zepbound 2.5 mg weekly and appetite is starting to increase She has had occasional constipation and heartburn (from eating a small dish of homeade ice cream)  Pharmacotherapy: Zepbound 2.5 mg weekly  PHYSICAL EXAM:  Blood pressure 121/80, pulse (!) 56, temperature 98.6 F (37 C), height 5\' 2"  (1.575 m), weight 155 lb (70.3 kg), last menstrual period 03/06/2011, SpO2 99%. Body mass index is 28.35 kg/m.  General: She is healthy appearing, cooperative, alert, well developed, and in no acute distress. PSYCH: Has normal mood, affect and thought process.   Lungs: Normal breathing effort, no conversational dyspnea.   ASSESSMENT AND PLAN  TREATMENT  PLAN FOR OBESITY:  Recommended Dietary Goals  Lilia is currently in the action stage of change. As such, her goal is to continue weight management plan. She has agreed to keeping a food journal and adhering to recommended goals of 1200 calories and 90 g of  protein.  Behavioral Intervention  We discussed the following Behavioral Modification Strategies today: increasing lean protein intake, decreasing simple carbohydrates , increasing vegetables, increasing lower glycemic fruits, increasing water intake, keeping healthy foods at home, continue to practice mindfulness when eating, planning for success, and staying on track while traveling and vacationing.  Additional resources provided today: NA  Recommended Physical Activity Goals  Marciel has been advised to work up to 150 minutes of moderate intensity aerobic activity a week and strengthening exercises 2-3 times per week for cardiovascular health, weight loss maintenance and preservation of muscle mass.   She has agreed to Work on scheduling and tracking physical activity.   Pharmacotherapy changes for the treatment of obesity: increase Zepbound to 5 mg weekly  ASSOCIATED CONDITIONS ADDRESSED TODAY  Hyperlipidemia, unspecified hyperlipidemia type Assessment & Plan: The 10-year ASCVD risk score (Arnett DK, et al., 2019) is: 2.6%   Values used to calculate the score:     Age: 75 years     Sex: Female     Is Non-Hispanic African American: No     Diabetic: No     Tobacco  smoker: No     Systolic Blood Pressure: 121 mmHg     Is BP treated: No     HDL Cholesterol: 65 mg/dL     Total Cholesterol: 224 mg/dL    Polyphagia Assessment & Plan: Improving on Zepbound 2.5 mg once weekly injection.  She has some waning satiety.  She denies adverse side effects.  She is able to control portion sizes, snacking and food cravings.  She is focus on lean protein and fiber intake with meals and snacks.  She is hydrating well with water.  Increase  Zepbound to 5 mg once weekly injection Aim for 80 plus g of dietary protein intake daily   Orders: -     Zepbound; Inject 5 mg into the skin once a week.  Dispense: 2 mL; Refill: 0  Generalized obesity with starting BMI 31  BMI 28.0-28.9,adult  Vitamin D deficiency Assessment & Plan: Last vitamin D Lab Results  Component Value Date   VD25OH 77.3 01/16/2023   Reviewed lab from last visit.  Her vitamin D level has improved.  She has been taking vitamin D 4000 IU once daily.  Recommend reducing vitamin D to 2000 IU once daily.       She was informed of the importance of frequent follow up visits to maximize her success with intensive lifestyle modifications for her multiple health conditions.   ATTESTASTION STATEMENTS:  Reviewed by clinician on day of visit: allergies, medications, problem list, medical history, surgical history, family history, social history, and previous encounter notes pertinent to obesity diagnosis.   I have personally spent 30 minutes total time today in preparation, patient care, nutritional counseling and documentation for this visit, including the following: review of clinical lab tests; review of medical tests/procedures/services.      Glennis Brink, DO DABFM, DABOM [facility] [address] [place], [place] [address] [phone]

## 2023-02-14 NOTE — Assessment & Plan Note (Signed)
Last vitamin D Lab Results  Component Value Date   VD25OH 77.3 01/16/2023   Reviewed lab from last visit.  Her vitamin D level has improved.  She has been taking vitamin D 4000 IU once daily.  Recommend reducing vitamin D to 2000 IU once daily.

## 2023-02-14 NOTE — Assessment & Plan Note (Addendum)
Improving on Zepbound 2.5 mg once weekly injection.  She has some waning satiety.  She denies adverse side effects.  She is able to control portion sizes, snacking and food cravings.  She is focus on lean protein and fiber intake with meals and snacks.  She is hydrating well with water.  Increase Zepbound to 5 mg once weekly injection Aim for 80 plus g of dietary protein intake daily

## 2023-02-14 NOTE — Assessment & Plan Note (Signed)
The 10-year ASCVD risk score (Arnett DK, et al., 2019) is: 2.6%   Values used to calculate the score:     Age: 62 years     Sex: Female     Is Non-Hispanic African American: No     Diabetic: No     Tobacco smoker: No     Systolic Blood Pressure: 121 mmHg     Is BP treated: No     HDL Cholesterol: 65 mg/dL     Total Cholesterol: 224 mg/dL

## 2023-02-20 ENCOUNTER — Telehealth (INDEPENDENT_AMBULATORY_CARE_PROVIDER_SITE_OTHER): Payer: 59 | Admitting: Internal Medicine

## 2023-02-20 DIAGNOSIS — E782 Mixed hyperlipidemia: Secondary | ICD-10-CM | POA: Diagnosis not present

## 2023-02-20 NOTE — Progress Notes (Unsigned)
Virtual Visit via Video Note  I connected with Katherine Beard on 02/20/23 at  2:00 PM EDT by a video enabled telemedicine application and verified that I am speaking with the correct person using two identifiers.  The patient and the provider were at separate locations throughout the entire encounter. Patient location: {}, Provider location: work   I discussed the limitations of evaluation and management by telemedicine and the availability of in person appointments. The patient expressed understanding and agreed to proceed. The patient and the provider were the only parties present for the visit unless noted in HPI below.  History of Present Illness: The patient is a 62 y.o. {} with visit for {}. Started {}. Has {}. Denies {}. Overall it is {}. Has tried {}  Observations/Objective: Appearance: {}, breathing appears {}, {} grooming, abdomen {} appear distended, throat {}, memory {}, mental status is {}  Assessment and Plan: See problem oriented charting  Follow Up Instructions: {}  I discussed the assessment and treatment plan with the patient. The patient was provided an opportunity to ask questions and all were answered. The patient agreed with the plan and demonstrated an understanding of the instructions.   The patient was advised to call back or seek an in-person evaluation if the symptoms worsen or if the condition fails to improve as anticipated.  Myrlene Broker, MD

## 2023-02-21 ENCOUNTER — Encounter: Payer: Self-pay | Admitting: Internal Medicine

## 2023-02-21 NOTE — Assessment & Plan Note (Signed)
Ordered calcium score. She is desiring to start hormone replacement patch and this is a good time to assess risk for CV disease.

## 2023-03-01 ENCOUNTER — Other Ambulatory Visit: Payer: Self-pay | Admitting: Nurse Practitioner

## 2023-03-01 DIAGNOSIS — C9000 Multiple myeloma not having achieved remission: Secondary | ICD-10-CM

## 2023-03-01 MED ORDER — DIAZEPAM 5 MG PO TABS: 5.0000 mg | ORAL_TABLET | Freq: Two times a day (BID) | ORAL | 0 refills | Status: DC | PRN

## 2023-03-01 MED ORDER — OXYCODONE HCL 5 MG PO TABS: 5.0000 mg | ORAL_TABLET | Freq: Four times a day (QID) | ORAL | 0 refills | Status: DC | PRN

## 2023-03-02 ENCOUNTER — Inpatient Hospital Stay: Payer: 59

## 2023-03-02 ENCOUNTER — Inpatient Hospital Stay: Payer: 59 | Attending: Oncology

## 2023-03-02 ENCOUNTER — Inpatient Hospital Stay: Payer: 59 | Admitting: Nurse Practitioner

## 2023-03-02 ENCOUNTER — Encounter: Payer: Self-pay | Admitting: Nurse Practitioner

## 2023-03-02 VITALS — BP 126/87 | HR 68 | Temp 98.1°F | Resp 18 | Ht 62.0 in | Wt 157.5 lb

## 2023-03-02 DIAGNOSIS — G8929 Other chronic pain: Secondary | ICD-10-CM | POA: Insufficient documentation

## 2023-03-02 DIAGNOSIS — C9001 Multiple myeloma in remission: Secondary | ICD-10-CM | POA: Diagnosis not present

## 2023-03-02 DIAGNOSIS — C9000 Multiple myeloma not having achieved remission: Secondary | ICD-10-CM | POA: Insufficient documentation

## 2023-03-02 DIAGNOSIS — M810 Age-related osteoporosis without current pathological fracture: Secondary | ICD-10-CM | POA: Diagnosis not present

## 2023-03-02 LAB — CBC WITH DIFFERENTIAL (CANCER CENTER ONLY)
Abs Immature Granulocytes: 0.01 10*3/uL (ref 0.00–0.07)
Basophils Absolute: 0.1 10*3/uL (ref 0.0–0.1)
Basophils Relative: 2 %
Eosinophils Absolute: 0.1 10*3/uL (ref 0.0–0.5)
Eosinophils Relative: 2 %
HCT: 39.5 % (ref 36.0–46.0)
Hemoglobin: 13.5 g/dL (ref 12.0–15.0)
Immature Granulocytes: 0 %
Lymphocytes Relative: 27 %
Lymphs Abs: 0.8 10*3/uL (ref 0.7–4.0)
MCH: 31.5 pg (ref 26.0–34.0)
MCHC: 34.2 g/dL (ref 30.0–36.0)
MCV: 92.1 fL (ref 80.0–100.0)
Monocytes Absolute: 0.4 10*3/uL (ref 0.1–1.0)
Monocytes Relative: 14 %
Neutro Abs: 1.6 10*3/uL — ABNORMAL LOW (ref 1.7–7.7)
Neutrophils Relative %: 55 %
Platelet Count: 225 10*3/uL (ref 150–400)
RBC: 4.29 MIL/uL (ref 3.87–5.11)
RDW: 13.6 % (ref 11.5–15.5)
WBC Count: 2.9 10*3/uL — ABNORMAL LOW (ref 4.0–10.5)
nRBC: 0 % (ref 0.0–0.2)

## 2023-03-02 LAB — CMP (CANCER CENTER ONLY)
ALT: 16 U/L (ref 0–44)
AST: 15 U/L (ref 15–41)
Albumin: 4.2 g/dL (ref 3.5–5.0)
Alkaline Phosphatase: 24 U/L — ABNORMAL LOW (ref 38–126)
Anion gap: 7 (ref 5–15)
BUN: 21 mg/dL — ABNORMAL HIGH (ref 6–20)
CO2: 28 mmol/L (ref 22–32)
Calcium: 8.7 mg/dL — ABNORMAL LOW (ref 8.9–10.3)
Chloride: 102 mmol/L (ref 98–111)
Creatinine: 0.85 mg/dL (ref 0.44–1.00)
GFR, Estimated: >60 mL/min (ref >60)
Glucose, Bld: 87 mg/dL (ref 70–99)
Potassium: 3.9 mmol/L (ref 3.5–5.1)
Sodium: 137 mmol/L (ref 135–145)
Total Bilirubin: 0.4 mg/dL (ref 0.3–1.2)
Total Protein: 6.5 g/dL (ref 6.5–8.1)

## 2023-03-02 MED ORDER — ZOLEDRONIC ACID 4 MG/100ML IV SOLN
4.0000 mg | Freq: Once | INTRAVENOUS | Status: AC
  Administered 2023-03-02: 4 mg via INTRAVENOUS
  Filled 2023-03-02: qty 100

## 2023-03-02 MED ORDER — SODIUM CHLORIDE 0.9 % IV SOLN: INTRAVENOUS | Status: DC

## 2023-03-02 NOTE — Progress Notes (Signed)
[facility] OFFICE PROGRESS NOTE   Diagnosis: Multiple myeloma  INTERVAL HISTORY:   Ms. Deprimo returns as scheduled.  She began the most recent cycle of Revlimid 02/15/2023.  She feels well.  Stable chronic back pain.  She takes oxycodone and Tylenol/ibuprofen as needed.  She is very active.  She exercises.  No nausea or vomiting.  No diarrhea.  She intermittently has numbness and color change in the middle fingers every 2 to 3 weeks.  Symptoms resolved spontaneously.  No mouth/tooth/gum/jaw issues.  Objective:  Vital signs in last 24 hours:  Blood pressure 126/87, pulse 68, temperature 98.1 F (36.7 C), temperature source Oral, resp. rate 18, height 5\' 2"  (1.575 m), weight 157 lb 8 oz (71.4 kg), last menstrual period 03/06/2011, SpO2 98%.    HEENT: No thrush or ulcers. Resp: Lungs clear bilaterally. Cardio: Regular rate and rhythm. GI: No hepatosplenomegaly. Vascular: No leg edema. Neuro: Alert and oriented. Skin: No rash.  Lab Results:  Lab Results  Component Value Date   WBC 2.5 (L) 01/30/2023   HGB 13.4 01/30/2023   HCT 39.0 01/30/2023   MCV 91.3 01/30/2023   PLT 207 01/30/2023   NEUTROABS 1.5 (L) 01/30/2023    Imaging:  No results found.  Medications: I have reviewed the patient's current medications.  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 Increase serum free lambda light chains Serum M spike MRI pelvis 09/01/2020-innumerable small foci of signal abnormality concerning for multiple myeloma involvement Metastatic bone survey 09/03/2020-no discrete lytic bone lesions, compression fractures at T12, L1, L2, and L3, osteopenia Cycle 1 RVD 09/14/2020 Daratumumab beginning 09/28/2020 Cycle 2 RVD 10/05/2020 plus daratumumab Week 3 daratumumab 10/12/2020 Normal lambda light chains  10/26/2020 Cycle 3 RVD plus daratumumab 10/26/2020 Cycle 4 RVD plus daratumumab 11/16/2020, daratumumab changed to an every 2-week schedule beginning 11/23/2020 Cycle 5 RVD plus daratumumab 12/07/2020 Cycle 6 RVD plus daratumumab 12/28/2020 Normal lambda light chains 01/05/2021 Revlimid maintenance 21/28 days beginning 01/18/2021 St[facility] cell harvest at [facility] 03/29/2021 Revlimid resumed 03/30/2021 01/04/2023 normal light chains and negative serum immunofixation at [facility]   2. Severe back pain MRI lumbar spine July 12, 2020-subacute compression fractures at T12 and L1 MRI lumbar spine July 30, 2020-subacute T12 and L1 compression fractures, new subacute superior L2 endplate deformity MRI lumbar spine September 01, 2020 new/progressive compression fractures at L2 and L3, acute compression fracture at L4, evolution of T12 and L1 compression fractures MRI pelvis September 01, 2020-innumerable small foci of signal abnormality in the pelvis and femurs, no fracture identified, concerning for multiple myeloma versus metastases Zometa beginning 09/21/2020-plan monthly x3 then every 3 months, next due 11/19/2021 MRI thoracic spine July 2022-new T3 compression fracture with multiple additional thoracic compression fractures 3. Melanoma left upper back 2014 4. History of diverticulitis 5. Mild hypercalcemia at presentation-resolved 6. Osteoporosis 7. [place] 8.  Depression-worsened depression symptoms 01/11/2021, referred to psychology  Disposition: Ms. Westbrooks appears well.  She continues Revlimid maintenance.  We will follow-up on the light chains from today.  CBC reviewed.  She has stable mild neutropenia.  She understands to contact the office with signs of infection.  She has stable chronic back pain.  She will continue the same pain medication regimen.  She is scheduled for Zometa today.  She is up-to-date on dental evaluations.  She will return for lab, follow-up and Zometa in 3 months.  We are available to see her  sooner if she has any problems.    Lonna Cobb ANP/GNP-BC   03/02/2023  11:27 AM

## 2023-03-02 NOTE — Patient Instructions (Signed)

## 2023-03-02 NOTE — Progress Notes (Signed)
Patient seen by Lonna Cobb NP today  Vitals are within treatment parameters.  Labs reviewed by Lonna Cobb NP and are not all within treatment parameters. WBC 2.9 and Neut 1.6 Ok to proceed   Per physician team, patient is ready for treatment and there are NO modifications to the treatment plan.

## 2023-03-03 LAB — KAPPA/LAMBDA LIGHT CHAINS
Kappa free light chain: 15.2 mg/L (ref 3.3–19.4)
Kappa, lambda light chain ratio: 1.17 (ref 0.26–1.65)
Lambda free light chains: 13 mg/L (ref 5.7–26.3)

## 2023-03-07 ENCOUNTER — Ambulatory Visit (HOSPITAL_BASED_OUTPATIENT_CLINIC_OR_DEPARTMENT_OTHER)
Admission: RE | Admit: 2023-03-07 | Discharge: 2023-03-07 | Disposition: A | Discharge status: Home / Self Care | Payer: 59 | Source: Ambulatory Visit | Attending: Oncology | Admitting: Oncology

## 2023-03-07 ENCOUNTER — Ambulatory Visit (HOSPITAL_BASED_OUTPATIENT_CLINIC_OR_DEPARTMENT_OTHER)
Admission: RE | Admit: 2023-03-07 | Discharge: 2023-03-07 | Disposition: A | Discharge status: Home / Self Care | Payer: Self-pay | Source: Ambulatory Visit | Attending: Internal Medicine | Admitting: Internal Medicine

## 2023-03-07 DIAGNOSIS — C9000 Multiple myeloma not having achieved remission: Secondary | ICD-10-CM | POA: Insufficient documentation

## 2023-03-07 DIAGNOSIS — E782 Mixed hyperlipidemia: Secondary | ICD-10-CM | POA: Insufficient documentation

## 2023-03-08 ENCOUNTER — Other Ambulatory Visit: Payer: Self-pay | Admitting: Oncology

## 2023-03-08 DIAGNOSIS — C9000 Multiple myeloma not having achieved remission: Secondary | ICD-10-CM

## 2023-03-10 ENCOUNTER — Encounter: Payer: Self-pay | Admitting: Oncology

## 2023-03-13 ENCOUNTER — Encounter (HOSPITAL_BASED_OUTPATIENT_CLINIC_OR_DEPARTMENT_OTHER): Payer: Self-pay

## 2023-03-13 ENCOUNTER — Other Ambulatory Visit (HOSPITAL_BASED_OUTPATIENT_CLINIC_OR_DEPARTMENT_OTHER): Payer: Self-pay

## 2023-03-13 ENCOUNTER — Other Ambulatory Visit (INDEPENDENT_AMBULATORY_CARE_PROVIDER_SITE_OTHER): Payer: Self-pay

## 2023-03-13 ENCOUNTER — Encounter (INDEPENDENT_AMBULATORY_CARE_PROVIDER_SITE_OTHER): Payer: Self-pay

## 2023-03-13 ENCOUNTER — Ambulatory Visit (INDEPENDENT_AMBULATORY_CARE_PROVIDER_SITE_OTHER): Payer: 59 | Admitting: Internal Medicine

## 2023-03-13 ENCOUNTER — Telehealth (INDEPENDENT_AMBULATORY_CARE_PROVIDER_SITE_OTHER): Payer: Self-pay | Admitting: Internal Medicine

## 2023-03-13 ENCOUNTER — Other Ambulatory Visit (HOSPITAL_COMMUNITY): Payer: Self-pay

## 2023-03-13 ENCOUNTER — Encounter (INDEPENDENT_AMBULATORY_CARE_PROVIDER_SITE_OTHER): Payer: Self-pay | Admitting: Internal Medicine

## 2023-03-13 VITALS — BP 121/77 | HR 80 | Temp 98.0°F | Ht 62.0 in | Wt 153.0 lb

## 2023-03-13 DIAGNOSIS — Z9189 Other specified personal risk factors, not elsewhere classified: Secondary | ICD-10-CM | POA: Diagnosis not present

## 2023-03-13 DIAGNOSIS — E669 Obesity, unspecified: Secondary | ICD-10-CM

## 2023-03-13 DIAGNOSIS — Z6827 Body mass index (BMI) 27.0-27.9, adult: Secondary | ICD-10-CM

## 2023-03-13 DIAGNOSIS — R638 Other symptoms and signs concerning food and fluid intake: Secondary | ICD-10-CM | POA: Insufficient documentation

## 2023-03-13 MED ORDER — ZEPBOUND 5 MG/0.5ML ~~LOC~~ SOAJ
5.0000 mg | SUBCUTANEOUS | 0 refills | Status: DC
Start: 2023-03-13 — End: 2023-03-13

## 2023-03-13 MED ORDER — ROSUVASTATIN CALCIUM 20 MG PO TABS: 20.0000 mg | ORAL_TABLET | Freq: Every day | ORAL | 3 refills | Status: DC

## 2023-03-13 MED ORDER — ZEPBOUND 5 MG/0.5ML ~~LOC~~ SOAJ
5.0000 mg | SUBCUTANEOUS | 0 refills | Status: DC
Start: 2023-03-13 — End: 2023-04-12
  Filled 2023-03-13 – 2023-03-14 (×8): qty 2, 28d supply, fill #0

## 2023-03-13 NOTE — Assessment & Plan Note (Addendum)
She has family history of coronary artery disease, her 10-year cardiovascular risk is estimated to be 2.6% and low.  She recently had a coronary artery calcium score was 9.3.  So she may derive a benefit from statin therapy albeit small.  Patient will discuss this with primary care team.

## 2023-03-13 NOTE — Progress Notes (Signed)
Office: [phone]  /  Fax: [phone]  WEIGHT SUMMARY AND BIOMETRICS  Vitals Temp: 98 F (36.7 C) BP: 121/77 Pulse Rate: 80 SpO2: 98 %   Anthropometric Measurements Height: 5\' 2"  (1.575 m) Weight: 153 lb (69.4 kg) BMI (Calculated): 27.98 Weight at Last Visit: 155 lb Weight Lost Since Last Visit: 2 lb Weight Gained Since Last Visit: 0 lb Starting Weight: 172 lb Total Weight Loss (lbs): 19 lb (8.618 kg) Peak Weight: 180 lb   Body Composition  Body Fat %: 34.5 % Fat Mass (lbs): 52.8 lbs Muscle Mass (lbs): 95.2 lbs Total Body Water (lbs): 66.6 lbs Visceral Fat Rating : 8    No data recorded Today's Visit #: 8  Starting Date: 07/13/22   HPI  Chief Complaint: OBESITY  Katherine Beard is here to discuss her progress with her obesity treatment plan. She is on the the Category 1 Plan and states she is following her eating plan approximately 90-95 % of the time. She states she is exercising 45 minutes 3 times per week.  Interval History:  This is my second encounter with Katherine Beard.  She is a pleasant 62 year old female affected by obesity and former patient of Dr. Cathey Endow.  Since last office visit she has lost 2 lbs. she is currently on Zepbound 5 mg once a day with adequate orixegenic control. She reports good adherence to reduced calorie nutritional plan. She has been working on not skipping meals, increasing protein intake at every meal, and drinking more water  Orexigenic Control: Denies problems with appetite and hunger signals.  Denies problems with satiety and satiation.  Denies problems with eating patterns and portion control.  Denies abnormal cravings. Denies feeling deprived or restricted.   Barriers identified: medical comorbidities and presence of obesogenic drugs.   Pharmacotherapy for weight loss: She is currently taking Zepbound with adequate clinical response  and without side effects..    ASSESSMENT AND PLAN  TREATMENT PLAN FOR  OBESITY:  Recommended Dietary Goals  Katherine Beard is currently in the action stage of change. As such, her goal is to continue weight management plan. She has agreed to: continue current plan  Behavioral Intervention  We discussed the following Behavioral Modification Strategies today: increasing lean protein intake, decreasing simple carbohydrates , increasing vegetables, increasing lower glycemic fruits, increasing fiber rich foods, increasing water intake, continue to practice mindfulness when eating, and planning for success.  Additional resources provided today: None  Recommended Physical Activity Goals  Katherine Beard has been advised to work up to 150 minutes of moderate intensity aerobic activity a week and strengthening exercises 2-3 times per week for cardiovascular health, weight loss maintenance and preservation of muscle mass.   She has agreed to :  Continue current level of physical activity   Pharmacotherapy We discussed various medication options to help [place] with her weight loss efforts and we both agreed to : continue current anti-obesity medication regimen.  ASSOCIATED CONDITIONS ADDRESSED TODAY  Generalized obesity with starting BMI 31 Assessment & Plan: Her peak weight is 180, she has lost 27 pounds or 15% of total body weight.  She is on a reduced calorie nutrition plan and is on Zepbound 5 mg once a week with adequate orixegenic control.  She has some problems with irregularity in her bowel movements we discussed ways to palliate this.  She is tracking and journaling.  Continue with medically supervised weight management plan.   Abnormal food appetite Assessment & Plan: Improved on incretin therapy.  She had increased orexigenic signaling,  impaired satiety and inhibitory control. This is secondary to an abnormal energy regulation system and pathological neurohormonal pathways characteristic of excess adiposity.  In addition to nutritional and behavioral strategies she benefits  from ongoing pharmacotherapy.    Orders: -     Zepbound; Inject 5 mg into the skin once a week.  Dispense: 2 mL; Refill: 0  At increased risk for cardiovascular disease Assessment & Plan: She has family history of coronary artery disease, her 10-year cardiovascular risk is estimated to be 2.6% and low.  She recently had a coronary artery calcium score was 9.3.  So she may derive a benefit from statin therapy albeit small.  Patient will discuss this with primary care team.     PHYSICAL EXAM:  Blood pressure 121/77, pulse 80, temperature 98 F (36.7 C), height 5\' 2"  (1.575 m), weight 153 lb (69.4 kg), last menstrual period 03/06/2011, SpO2 98%. Body mass index is 27.98 kg/m.  General: She is overweight, cooperative, alert, well developed, and in no acute distress. PSYCH: Has normal mood, affect and thought process.   HEENT: EOMI, sclerae are anicteric. Lungs: Normal breathing effort, no conversational dyspnea. Extremities: No edema.  Neurologic: No gross sensory or motor deficits. No tremors or fasciculations noted.    DIAGNOSTIC DATA REVIEWED:  BMET    Component Value Date/Time   NA 137 03/02/2023 1102   K 3.9 03/02/2023 1102   CL 102 03/02/2023 1102   CO2 28 03/02/2023 1102   GLUCOSE 87 03/02/2023 1102   BUN 21 (H) 03/02/2023 1102   CREATININE 0.85 03/02/2023 1102   CALCIUM 8.7 (L) 03/02/2023 1102   GFRNONAA >60 03/02/2023 1102   GFRAA 78 12/11/2006 1036   Lab Results  Component Value Date   HGBA1C 5.4 07/13/2022   HGBA1C 5.6 12/11/2006   Lab Results  Component Value Date   INSULIN 4.4 07/13/2022   Lab Results  Component Value Date   TSH 1.360 07/13/2022   CBC    Component Value Date/Time   WBC 2.9 (L) 03/02/2023 1102   WBC 3.8 (L) 09/28/2020 0958   RBC 4.29 03/02/2023 1102   HGB 13.5 03/02/2023 1102   HCT 39.5 03/02/2023 1102   PLT 225 03/02/2023 1102   MCV 92.1 03/02/2023 1102   MCH 31.5 03/02/2023 1102   MCHC 34.2 03/02/2023 1102   RDW 13.6  03/02/2023 1102   Iron Studies    Component Value Date/Time   IRON 65 07/08/2020 1509   FERRITIN 79.6 10/14/2022 1654   IRONPCTSAT 17.4 (L) 07/08/2020 1509   Lipid Panel     Component Value Date/Time   CHOL 224 (H) 01/16/2023 1115   TRIG 95 01/16/2023 1115   HDL 65 01/16/2023 1115   CHOLHDL 3.4 01/16/2023 1115   CHOLHDL 3 09/12/2019 0847   VLDL 14.8 09/12/2019 0847   LDLCALC 142 (H) 01/16/2023 1115   LDLDIRECT 173.7 11/26/2010 0756   Hepatic Function Panel     Component Value Date/Time   PROT 6.5 03/02/2023 1102   ALBUMIN 4.2 03/02/2023 1102   AST 15 03/02/2023 1102   ALT 16 03/02/2023 1102   ALKPHOS 24 (L) 03/02/2023 1102   BILITOT 0.4 03/02/2023 1102   BILIDIR 0.1 11/26/2010 0756   IBILI 0.4 11/30/2007 2242      Component Value Date/Time   TSH 1.360 07/13/2022 0931   Nutritional Lab Results  Component Value Date   VD25OH 77.3 01/16/2023   VD25OH 29.6 (L) 07/13/2022   VD25OH 69.83 07/08/2020     Return  in about 4 weeks (around 04/10/2023) for For Weight Mangement with Dr. Rikki Spearing.Marland Kitchen She was informed of the importance of frequent follow up visits to maximize her success with intensive lifestyle modifications for her multiple health conditions.   ATTESTASTION STATEMENTS:  Reviewed by clinician on day of visit: allergies, medications, problem list, medical history, surgical history, family history, social history, and previous encounter notes.     Worthy Rancher, MD

## 2023-03-13 NOTE — Assessment & Plan Note (Signed)
Her peak weight is 180, she has lost 27 pounds or 15% of total body weight.  She is on a reduced calorie nutrition plan and is on Zepbound 5 mg once a week with adequate orixegenic control.  She has some problems with irregularity in her bowel movements we discussed ways to palliate this.  She is tracking and journaling.  Continue with medically supervised weight management plan.

## 2023-03-13 NOTE — Addendum Note (Signed)
Addended by: Hillard Danker A on: 03/13/2023 10:14 AM   Modules accepted: Orders

## 2023-03-13 NOTE — Telephone Encounter (Signed)
Spoke to pt and advised Rx sent to [facility]

## 2023-03-13 NOTE — Assessment & Plan Note (Signed)
Improved on incretin therapy.  She had increased orexigenic signaling, impaired satiety and inhibitory control. This is secondary to an abnormal energy regulation system and pathological neurohormonal pathways characteristic of excess adiposity.  In addition to nutritional and behavioral strategies she benefits from ongoing pharmacotherapy.

## 2023-03-13 NOTE — Telephone Encounter (Signed)
The patient stated she needed a pharmacy change, due to her pharmacy not having Zepbound for a couple of weeks.  She wants it sent to the [facility] pharmacy [place]. Please call to confirm

## 2023-03-14 ENCOUNTER — Other Ambulatory Visit: Payer: Self-pay

## 2023-03-14 ENCOUNTER — Other Ambulatory Visit (HOSPITAL_BASED_OUTPATIENT_CLINIC_OR_DEPARTMENT_OTHER): Payer: Self-pay

## 2023-03-14 ENCOUNTER — Encounter: Payer: Self-pay | Admitting: Oncology

## 2023-03-16 ENCOUNTER — Other Ambulatory Visit: Payer: Self-pay | Admitting: Nurse Practitioner

## 2023-03-16 DIAGNOSIS — C9000 Multiple myeloma not having achieved remission: Secondary | ICD-10-CM

## 2023-03-16 MED ORDER — OXYCODONE HCL 5 MG PO TABS: 5.0000 mg | ORAL_TABLET | Freq: Four times a day (QID) | ORAL | 0 refills | Status: DC | PRN

## 2023-04-04 ENCOUNTER — Other Ambulatory Visit: Payer: Self-pay | Admitting: Oncology

## 2023-04-04 DIAGNOSIS — C9000 Multiple myeloma not having achieved remission: Secondary | ICD-10-CM

## 2023-04-04 MED ORDER — LENALIDOMIDE 10 MG PO CAPS: 10.0000 mg | ORAL_CAPSULE | Freq: Every day | ORAL | 0 refills | Status: DC

## 2023-04-05 ENCOUNTER — Encounter: Payer: Self-pay | Admitting: Oncology

## 2023-04-07 ENCOUNTER — Other Ambulatory Visit: Payer: Self-pay | Admitting: Nurse Practitioner

## 2023-04-07 ENCOUNTER — Other Ambulatory Visit: Payer: Self-pay | Admitting: Oncology

## 2023-04-07 DIAGNOSIS — C9000 Multiple myeloma not having achieved remission: Secondary | ICD-10-CM

## 2023-04-07 MED ORDER — OXYCODONE HCL 5 MG PO TABS: 5.0000 mg | ORAL_TABLET | Freq: Four times a day (QID) | ORAL | 0 refills | Status: DC | PRN

## 2023-04-07 NOTE — Telephone Encounter (Signed)
Forwarded to NP.

## 2023-04-10 ENCOUNTER — Encounter: Payer: Self-pay | Admitting: Oncology

## 2023-04-12 ENCOUNTER — Other Ambulatory Visit (HOSPITAL_BASED_OUTPATIENT_CLINIC_OR_DEPARTMENT_OTHER): Payer: Self-pay

## 2023-04-12 ENCOUNTER — Encounter (INDEPENDENT_AMBULATORY_CARE_PROVIDER_SITE_OTHER): Payer: Self-pay | Admitting: Internal Medicine

## 2023-04-12 ENCOUNTER — Ambulatory Visit (INDEPENDENT_AMBULATORY_CARE_PROVIDER_SITE_OTHER): Payer: 59 | Admitting: Internal Medicine

## 2023-04-12 VITALS — BP 112/72 | HR 57 | Temp 97.9°F | Ht 62.0 in | Wt 150.0 lb

## 2023-04-12 DIAGNOSIS — R638 Other symptoms and signs concerning food and fluid intake: Secondary | ICD-10-CM | POA: Diagnosis not present

## 2023-04-12 DIAGNOSIS — E669 Obesity, unspecified: Secondary | ICD-10-CM

## 2023-04-12 DIAGNOSIS — E785 Hyperlipidemia, unspecified: Secondary | ICD-10-CM | POA: Diagnosis not present

## 2023-04-12 DIAGNOSIS — C9000 Multiple myeloma not having achieved remission: Secondary | ICD-10-CM

## 2023-04-12 DIAGNOSIS — Z6827 Body mass index (BMI) 27.0-27.9, adult: Secondary | ICD-10-CM

## 2023-04-12 MED ORDER — ZEPBOUND 5 MG/0.5ML ~~LOC~~ SOAJ
5.0000 mg | SUBCUTANEOUS | 0 refills | Status: DC
Start: 2023-04-12 — End: 2023-05-03
  Filled 2023-04-12: qty 2, 28d supply, fill #0

## 2023-04-12 NOTE — Assessment & Plan Note (Signed)
Patient has chronic bone pain requiring opiate pain medication.  This affects her ability to engage in strengthening activities.   We discussed immunocompromised state she will be getting her updated influenza and COVID-vaccine.

## 2023-04-12 NOTE — Assessment & Plan Note (Signed)
Improved on incretin therapy.  She had increased orexigenic signaling, impaired satiety and inhibitory control. This is secondary to an abnormal energy regulation system and pathological neurohormonal pathways characteristic of excess adiposity.  In addition to nutritional and behavioral strategies she benefits from ongoing pharmacotherapy.

## 2023-04-12 NOTE — Assessment & Plan Note (Signed)
LDL is not at goal. Elevated LDL may be secondary to nutrition, genetics and spillover effect from excess adiposity. Recommended LDL goal is <70 to reduce the risk of fatty streaks and the progression to obstructive ASCVD in the future.  Reviewed coronary artery calcium score from March 07, 2023 it was 9.3 and in the 73rd percentile.  Her 10 year risk is: The 10-year ASCVD risk score (Arnett DK, et al., 2019) is: 2.2%  Lab Results  Component Value Date   CHOL 224 (H) 01/16/2023   HDL 65 01/16/2023   LDLCALC 142 (H) 01/16/2023   LDLDIRECT 173.7 11/26/2010   TRIG 95 01/16/2023   CHOLHDL 3.4 01/16/2023   She is now on rosuvastatin 20 mg a day.  She does not report any side effects.  We discussed the benefits of statin therapy for cardiovascular disease prevention.  Patient will also continue with reduced calorie nutrition plan for weight management

## 2023-04-12 NOTE — Progress Notes (Signed)
Office: [phone]  /  Fax: [phone]  WEIGHT SUMMARY AND BIOMETRICS  Vitals Temp: 97.9 F (36.6 C) BP: 112/72 Pulse Rate: (!) 57 SpO2: 100 %   Anthropometric Measurements Height: 5\' 2"  (1.575 m) Weight: 150 lb (68 kg) BMI (Calculated): 27.43 Weight at Last Visit: 153 lb Weight Lost Since Last Visit: 3 lb Weight Gained Since Last Visit: 0 Starting Weight: 172 lb Total Weight Loss (lbs): 22 lb (9.979 kg) Peak Weight: 180 lb   Body Composition  Body Fat %: 3407 % Fat Mass (lbs): 52.2 lbs Muscle Mass (lbs): 93 lbs Total Body Water (lbs): 66.2 lbs Visceral Fat Rating : 8    No data recorded Today's Visit #: 9  Starting Date: 07/13/22   HPI  Chief Complaint: OBESITY  Patient presents today for follow-up on obesity.  She is tracking and journaling 1200 cal with 90 g of protein per day.  She is following her plan about 80% of the time.  She has not been exercising due to recent viral illness.  Interval History:  Since last office visit she has lost 3 pounds. She reports good adherence to reduced calorie nutritional plan. She has been working on reading food labels, not skipping meals, increasing protein intake at every meal, drinking more water, making healthier choices, reducing portion sizes, and incorporating more whole foods  Orexigenic Control: Reports problems with appetite and hunger signals.  Denies problems with satiety and satiation.  Denies problems with eating patterns and portion control.  Denies abnormal cravings. Denies feeling deprived or restricted.   Barriers identified: strong hunger signals and impaired satiety / inhibitory control, orthopedic problems, medical conditions or chronic pain affecting mobility, and medical comorbidities.   Pharmacotherapy for weight loss: She is currently taking Zepbound with adequate clinical response  and without side effects..    ASSESSMENT AND PLAN  TREATMENT PLAN FOR OBESITY:  Recommended  Dietary Goals  Katherine Beard is currently in the action stage of change. As such, her goal is to continue weight management plan. She has agreed to: continue current plan  Behavioral Intervention  We discussed the following Behavioral Modification Strategies today: continue to work on maintaining a reduced calorie state, getting the recommended amount of protein, incorporating whole foods, making healthy choices, staying well hydrated and practicing mindfulness when eating..  Additional resources provided today: None  Recommended Physical Activity Goals  Katherine Beard has been advised to work up to 150 minutes of moderate intensity aerobic activity a week and strengthening exercises 2-3 times per week for cardiovascular health, weight loss maintenance and preservation of muscle mass.   She has agreed to :  Think about enjoyable ways to increase daily physical activity and overcoming barriers to exercise and Increase physical activity in their day and reduce sedentary time (increase NEAT).  Pharmacotherapy We discussed various medication options to help Secret with her weight loss efforts and we both agreed to : continue current anti-obesity medication regimen  ASSOCIATED CONDITIONS ADDRESSED TODAY  Hyperlipidemia, unspecified hyperlipidemia type Assessment & Plan: LDL is not at goal. Elevated LDL may be secondary to nutrition, genetics and spillover effect from excess adiposity. Recommended LDL goal is <70 to reduce the risk of fatty streaks and the progression to obstructive ASCVD in the future.  Reviewed coronary artery calcium score from March 07, 2023 it was 9.3 and in the 73rd percentile.  Her 10 year risk is: The 10-year ASCVD risk score (Arnett DK, et al., 2019) is: 2.2%  Lab Results  Component Value Date  CHOL 224 (H) 01/16/2023   HDL 65 01/16/2023   LDLCALC 142 (H) 01/16/2023   LDLDIRECT 173.7 11/26/2010   TRIG 95 01/16/2023   CHOLHDL 3.4 01/16/2023   She is now on rosuvastatin 20  mg a day.  She does not report any side effects.  We discussed the benefits of statin therapy for cardiovascular disease prevention.  Patient will also continue with reduced calorie nutrition plan for weight management     Abnormal food appetite Assessment & Plan: Improved on incretin therapy.  She had increased orexigenic signaling, impaired satiety and inhibitory control. This is secondary to an abnormal energy regulation system and pathological neurohormonal pathways characteristic of excess adiposity.  In addition to nutritional and behavioral strategies she benefits from ongoing pharmacotherapy.    Orders: -     Zepbound; Inject 5 mg into the skin once a week.  Dispense: 2 mL; Refill: 0  Generalized obesity with starting BMI 31 Assessment & Plan: Her peak weight is 180, she has lost 30 pounds or 15% of total body weight.  She is on a reduced calorie nutrition plan and is on Zepbound 5 mg once a week with adequate orixegenic control.  She will continue at this dose.  She has some problems with irregularity in her bowel movements which has improved with the use of MiraLAX . She is tracking and journaling.  Continue with medically supervised weight management plan.   Multiple myeloma, remission status unspecified (HCC) Assessment & Plan: Patient has chronic bone pain requiring opiate pain medication.  This affects her ability to engage in strengthening activities.   We discussed immunocompromised state she will be getting her updated influenza and COVID-vaccine.     PHYSICAL EXAM:  Blood pressure 112/72, pulse (!) 57, temperature 97.9 F (36.6 C), height 5\' 2"  (1.575 m), weight 150 lb (68 kg), last menstrual period 03/06/2011, SpO2 100%. Body mass index is 27.44 kg/m.  General: She is overweight, cooperative, alert, well developed, and in no acute distress. PSYCH: Has normal mood, affect and thought process.   HEENT: EOMI, sclerae are anicteric. Lungs: Normal breathing effort,  no conversational dyspnea. Extremities: No edema.  Neurologic: No gross sensory or motor deficits. No tremors or fasciculations noted.    DIAGNOSTIC DATA REVIEWED:  BMET    Component Value Date/Time   NA 137 03/02/2023 1102   K 3.9 03/02/2023 1102   CL 102 03/02/2023 1102   CO2 28 03/02/2023 1102   GLUCOSE 87 03/02/2023 1102   BUN 21 (H) 03/02/2023 1102   CREATININE 0.85 03/02/2023 1102   CALCIUM 8.7 (L) 03/02/2023 1102   GFRNONAA >60 03/02/2023 1102   GFRAA 78 12/11/2006 1036   Lab Results  Component Value Date   HGBA1C 5.4 07/13/2022   HGBA1C 5.6 12/11/2006   Lab Results  Component Value Date   INSULIN 4.4 07/13/2022   Lab Results  Component Value Date   TSH 1.360 07/13/2022   CBC    Component Value Date/Time   WBC 2.9 (L) 03/02/2023 1102   WBC 3.8 (L) 09/28/2020 0958   RBC 4.29 03/02/2023 1102   HGB 13.5 03/02/2023 1102   HCT 39.5 03/02/2023 1102   PLT 225 03/02/2023 1102   MCV 92.1 03/02/2023 1102   MCH 31.5 03/02/2023 1102   MCHC 34.2 03/02/2023 1102   RDW 13.6 03/02/2023 1102   Iron Studies    Component Value Date/Time   IRON 65 07/08/2020 1509   FERRITIN 79.6 10/14/2022 1654   IRONPCTSAT 17.4 (L) 07/08/2020  1509   Lipid Panel     Component Value Date/Time   CHOL 224 (H) 01/16/2023 1115   TRIG 95 01/16/2023 1115   HDL 65 01/16/2023 1115   CHOLHDL 3.4 01/16/2023 1115   CHOLHDL 3 09/12/2019 0847   VLDL 14.8 09/12/2019 0847   LDLCALC 142 (H) 01/16/2023 1115   LDLDIRECT 173.7 11/26/2010 0756   Hepatic Function Panel     Component Value Date/Time   PROT 6.5 03/02/2023 1102   ALBUMIN 4.2 03/02/2023 1102   AST 15 03/02/2023 1102   ALT 16 03/02/2023 1102   ALKPHOS 24 (L) 03/02/2023 1102   BILITOT 0.4 03/02/2023 1102   BILIDIR 0.1 11/26/2010 0756   IBILI 0.4 11/30/2007 2242      Component Value Date/Time   TSH 1.360 07/13/2022 0931   Nutritional Lab Results  Component Value Date   VD25OH 77.3 01/16/2023   VD25OH 29.6 (L) 07/13/2022    VD25OH 69.83 07/08/2020     Return in about 3 weeks (around 05/03/2023) for For Weight Mangement with Dr. Rikki Spearing.Marland Kitchen She was informed of the importance of frequent follow up visits to maximize her success with intensive lifestyle modifications for her multiple health conditions.   ATTESTASTION STATEMENTS:  Reviewed by clinician on day of visit: allergies, medications, problem list, medical history, surgical history, family history, social history, and previous encounter notes.     Worthy Rancher, MD

## 2023-04-12 NOTE — Assessment & Plan Note (Signed)
Her peak weight is 180, she has lost 30 pounds or 15% of total body weight.  She is on a reduced calorie nutrition plan and is on Zepbound 5 mg once a week with adequate orixegenic control.  She will continue at this dose.  She has some problems with irregularity in her bowel movements which has improved with the use of MiraLAX . She is tracking and journaling.  Continue with medically supervised weight management plan.

## 2023-04-20 ENCOUNTER — Other Ambulatory Visit: Payer: Self-pay | Admitting: Nurse Practitioner

## 2023-04-20 DIAGNOSIS — C9000 Multiple myeloma not having achieved remission: Secondary | ICD-10-CM

## 2023-04-20 MED ORDER — DIAZEPAM 5 MG PO TABS: 5.0000 mg | ORAL_TABLET | Freq: Two times a day (BID) | ORAL | 0 refills | Status: DC | PRN

## 2023-04-28 ENCOUNTER — Other Ambulatory Visit: Payer: Self-pay | Admitting: Nurse Practitioner

## 2023-04-28 DIAGNOSIS — C9000 Multiple myeloma not having achieved remission: Secondary | ICD-10-CM

## 2023-04-28 MED ORDER — OXYCODONE HCL 5 MG PO TABS: 5.0000 mg | ORAL_TABLET | Freq: Four times a day (QID) | ORAL | 0 refills | Status: DC | PRN

## 2023-04-28 NOTE — Telephone Encounter (Signed)
Forwarded to NP.

## 2023-05-01 ENCOUNTER — Other Ambulatory Visit: Payer: Self-pay | Admitting: Oncology

## 2023-05-01 DIAGNOSIS — C9000 Multiple myeloma not having achieved remission: Secondary | ICD-10-CM

## 2023-05-02 ENCOUNTER — Other Ambulatory Visit: Payer: Self-pay | Admitting: Oncology

## 2023-05-02 DIAGNOSIS — C9000 Multiple myeloma not having achieved remission: Secondary | ICD-10-CM

## 2023-05-03 ENCOUNTER — Encounter (INDEPENDENT_AMBULATORY_CARE_PROVIDER_SITE_OTHER): Payer: Self-pay | Admitting: Internal Medicine

## 2023-05-03 ENCOUNTER — Encounter: Payer: Self-pay | Admitting: Oncology

## 2023-05-03 ENCOUNTER — Other Ambulatory Visit (HOSPITAL_BASED_OUTPATIENT_CLINIC_OR_DEPARTMENT_OTHER): Payer: Self-pay

## 2023-05-03 ENCOUNTER — Ambulatory Visit (INDEPENDENT_AMBULATORY_CARE_PROVIDER_SITE_OTHER): Payer: 59 | Admitting: Internal Medicine

## 2023-05-03 ENCOUNTER — Other Ambulatory Visit (HOSPITAL_COMMUNITY): Payer: Self-pay

## 2023-05-03 VITALS — BP 122/74 | HR 50 | Ht 62.0 in | Wt 150.0 lb

## 2023-05-03 DIAGNOSIS — E669 Obesity, unspecified: Secondary | ICD-10-CM

## 2023-05-03 DIAGNOSIS — R638 Other symptoms and signs concerning food and fluid intake: Secondary | ICD-10-CM | POA: Diagnosis not present

## 2023-05-03 DIAGNOSIS — Z6827 Body mass index (BMI) 27.0-27.9, adult: Secondary | ICD-10-CM | POA: Diagnosis not present

## 2023-05-03 MED ORDER — ZEPBOUND 7.5 MG/0.5ML ~~LOC~~ SOAJ
7.5000 mg | SUBCUTANEOUS | 0 refills | Status: DC
  Filled 2023-05-03 – 2023-05-05 (×3): qty 2, 28d supply, fill #0

## 2023-05-03 NOTE — Progress Notes (Unsigned)
Office: [phone]  /  Fax: [phone]  WEIGHT SUMMARY AND BIOMETRICS  Vitals BP: 122/74 Pulse Rate: (!) 50 SpO2: 98 %   Anthropometric Measurements Height: 5\' 2"  (1.575 m) Weight: 150 lb (68 kg) BMI (Calculated): 27.43 Weight at Last Visit: 150 lb Weight Lost Since Last Visit: 0 lb Weight Gained Since Last Visit: 0 lb Starting Weight: 172 lb Total Weight Loss (lbs): 22 lb (9.979 kg) Peak Weight: 180 lb   Body Composition  Body Fat %: 34.6 % Fat Mass (lbs): 52.2 lbs Muscle Mass (lbs): 93.6 lbs Total Body Water (lbs): 67.8 lbs Visceral Fat Rating : 8    No data recorded Today's Visit #: 10  Starting Date: 07/13/22   HPI  Chief Complaint: OBESITY  Katherine Beard is here to discuss her progress with her obesity treatment plan. She is on the the Category 1 Plan and states she is following her eating plan approximately 85 % of the time. She states she is exercising 30 minutes 5 times per week.  Interval History:  Since last office visit she has {emweight change:30888}. She reports {EMADHERENCE:28838::"good adherence to reduced calorie nutritional plan."} She has been working on [employer] labels","not skipping meals","increasing protein intake at every meal","drinking more water","making healthier choices","reducing portion sizes","incorporating more whole foods"}  Orexigenic Control: {ACTIONS;DENIES/REPORTS:21021675::"Denies"} problems with appetite and hunger signals.  {ACTIONS;DENIES/REPORTS:21021675::"Denies"} problems with satiety and satiation.  {ACTIONS;DENIES/REPORTS:21021675::"Denies"} problems with eating patterns and portion control.  {ACTIONS;DENIES/REPORTS:21021675::"Denies"} abnormal cravings. {ACTIONS;DENIES/REPORTS:21021675::"Denies"} feeling deprived or restricted.   Barriers identified: {EMOBESITYBARRIERS:28841::"none"}.   Pharmacotherapy for weight loss: She is currently taking {EMPharmaco:28845}.    ASSESSMENT AND  PLAN  TREATMENT PLAN FOR OBESITY:  Recommended Dietary Goals  Katherine Beard is currently in the action stage of change. As such, her goal is to continue weight management plan. She has agreed to: {EMWTLOSSPLAN:29297::"continue current plan"}  Behavioral Intervention  We discussed the following Behavioral Modification Strategies today: {EMWMwtlossstrategies:28914::"continue to work on maintaining a reduced calorie state, getting the recommended amount of protein, incorporating whole foods, making healthy choices, staying well hydrated and practicing mindfulness when eating."}.  Additional resources provided today: {EMadditionalresources:29169::"None"}  Recommended Physical Activity Goals  Katherine Beard has been advised to work up to 150 minutes of moderate intensity aerobic activity a week and strengthening exercises 2-3 times per week for cardiovascular health, weight loss maintenance and preservation of muscle mass.   She has agreed to :  {EMEXERCISE:28847::"Think about enjoyable ways to increase daily physical activity and overcoming barriers to exercise","Increase physical activity in their day and reduce sedentary time (increase NEAT)."}  Pharmacotherapy We discussed various medication options to help Katherine Beard with her weight loss efforts and we both agreed to : {EMagreedrx:29170::"continue with nutritional and behavioral strategies"}  ASSOCIATED CONDITIONS ADDRESSED TODAY  Generalized obesity -     Tirzepatide; Inject 7.5 mg into the skin once a week.  Dispense: 2 mL; Refill: 0  Abnormal food appetite -     Tirzepatide; Inject 7.5 mg into the skin once a week.  Dispense: 2 mL; Refill: 0    PHYSICAL EXAM:  Blood pressure 122/74, pulse (!) 50, height 5\' 2"  (1.575 m), weight 150 lb (68 kg), last menstrual period 03/06/2011, SpO2 98%. Body mass index is 27.44 kg/m.  General: She is overweight, cooperative, alert, well developed, and in no acute distress. PSYCH: Has normal mood, affect and  thought process.   HEENT: EOMI, sclerae are anicteric. Lungs: Normal breathing effort, no conversational dyspnea. Extremities: No edema.  Neurologic: No gross sensory or motor deficits. No tremors  or fasciculations noted.    DIAGNOSTIC DATA REVIEWED:  BMET    Component Value Date/Time   NA 137 03/02/2023 1102   K 3.9 03/02/2023 1102   CL 102 03/02/2023 1102   CO2 28 03/02/2023 1102   GLUCOSE 87 03/02/2023 1102   BUN 21 (H) 03/02/2023 1102   CREATININE 0.85 03/02/2023 1102   CALCIUM 8.7 (L) 03/02/2023 1102   GFRNONAA >60 03/02/2023 1102   GFRAA 78 12/11/2006 1036   Lab Results  Component Value Date   HGBA1C 5.4 07/13/2022   HGBA1C 5.6 12/11/2006   Lab Results  Component Value Date   INSULIN 4.4 07/13/2022   Lab Results  Component Value Date   TSH 1.360 07/13/2022   CBC    Component Value Date/Time   WBC 2.9 (L) 03/02/2023 1102   WBC 3.8 (L) 09/28/2020 0958   RBC 4.29 03/02/2023 1102   HGB 13.5 03/02/2023 1102   HCT 39.5 03/02/2023 1102   PLT 225 03/02/2023 1102   MCV 92.1 03/02/2023 1102   MCH 31.5 03/02/2023 1102   MCHC 34.2 03/02/2023 1102   RDW 13.6 03/02/2023 1102   Iron Studies    Component Value Date/Time   IRON 65 07/08/2020 1509   FERRITIN 79.6 10/14/2022 1654   IRONPCTSAT 17.4 (L) 07/08/2020 1509   Lipid Panel     Component Value Date/Time   CHOL 224 (H) 01/16/2023 1115   TRIG 95 01/16/2023 1115   HDL 65 01/16/2023 1115   CHOLHDL 3.4 01/16/2023 1115   CHOLHDL 3 09/12/2019 0847   VLDL 14.8 09/12/2019 0847   LDLCALC 142 (H) 01/16/2023 1115   LDLDIRECT 173.7 11/26/2010 0756   Hepatic Function Panel     Component Value Date/Time   PROT 6.5 03/02/2023 1102   ALBUMIN 4.2 03/02/2023 1102   AST 15 03/02/2023 1102   ALT 16 03/02/2023 1102   ALKPHOS 24 (L) 03/02/2023 1102   BILITOT 0.4 03/02/2023 1102   BILIDIR 0.1 11/26/2010 0756   IBILI 0.4 11/30/2007 2242      Component Value Date/Time   TSH 1.360 07/13/2022 0931   Nutritional Lab  Results  Component Value Date   VD25OH 77.3 01/16/2023   VD25OH 29.6 (L) 07/13/2022   VD25OH 69.83 07/08/2020     Return in about 4 weeks (around 05/31/2023) for For Weight Mangement with Dr. Rikki Spearing.Marland Kitchen She was informed of the importance of frequent follow up visits to maximize her success with intensive lifestyle modifications for her multiple health conditions.   ATTESTASTION STATEMENTS:  Reviewed by clinician on day of visit: allergies, medications, problem list, medical history, surgical history, family history, social history, and previous encounter notes.     Worthy Rancher, MD

## 2023-05-04 ENCOUNTER — Other Ambulatory Visit (HOSPITAL_COMMUNITY): Payer: Self-pay

## 2023-05-04 DIAGNOSIS — Z6826 Body mass index (BMI) 26.0-26.9, adult: Secondary | ICD-10-CM | POA: Insufficient documentation

## 2023-05-04 DIAGNOSIS — Z6827 Body mass index (BMI) 27.0-27.9, adult: Secondary | ICD-10-CM | POA: Insufficient documentation

## 2023-05-04 DIAGNOSIS — Z6825 Body mass index (BMI) 25.0-25.9, adult: Secondary | ICD-10-CM | POA: Insufficient documentation

## 2023-05-04 NOTE — Assessment & Plan Note (Signed)
She has increased orexigenic signaling and cravings.  This is secondary to an abnormal energy regulation system and pathological neurohormonal pathways characteristic of excess adiposity.  In addition to nutritional and behavioral strategies she benefits from pharmacotherapy.  She is experiencing waning effect of Zepbound.  Medication will be increased to 7.5 mg once a week.

## 2023-05-04 NOTE — Assessment & Plan Note (Signed)
 See obesity treatment plan

## 2023-05-05 ENCOUNTER — Encounter: Payer: Self-pay | Admitting: Oncology

## 2023-05-05 ENCOUNTER — Other Ambulatory Visit (HOSPITAL_BASED_OUTPATIENT_CLINIC_OR_DEPARTMENT_OTHER): Payer: Self-pay

## 2023-05-08 ENCOUNTER — Other Ambulatory Visit (HOSPITAL_BASED_OUTPATIENT_CLINIC_OR_DEPARTMENT_OTHER): Payer: Self-pay

## 2023-05-18 ENCOUNTER — Telehealth (INDEPENDENT_AMBULATORY_CARE_PROVIDER_SITE_OTHER): Payer: 59 | Admitting: Nurse Practitioner

## 2023-05-18 DIAGNOSIS — F419 Anxiety disorder, unspecified: Secondary | ICD-10-CM | POA: Diagnosis not present

## 2023-05-18 MED ORDER — BUSPIRONE HCL 10 MG PO TABS: 10.0000 mg | ORAL_TABLET | Freq: Three times a day (TID) | ORAL | 2 refills | Status: DC | PRN

## 2023-05-18 MED ORDER — DESVENLAFAXINE SUCCINATE ER 25 MG PO TB24: 25.0000 mg | ORAL_TABLET | Freq: Every day | ORAL | 2 refills | Status: DC

## 2023-05-18 NOTE — Progress Notes (Addendum)
Established Patient Office Visit  An audio/visual tele-health visit was completed today for this patient. I connected with  Katherine Beard on 05/18/23 utilizing audio/visual technology and verified that I am speaking with the correct person using two identifiers. The patient was located at their home, and I was located at the office of [facility] at [facility] during the encounter. I discussed the limitations of evaluation and management by telemedicine. The patient expressed understanding and agreed to proceed.     Subjective   Patient ID: Katherine Beard, female    DOB: Mar 06, 1961  Age: 62 y.o. MRN: [id number]  Chief Complaint  Patient presents with   Anxiety    Been anxiety with little bit panic attack, more of every day issue, especially in the morning, have some form of cancer. A lot of changes that is causing these anxiety, not being able to cope with it     Anxiety: Patient is a 62 year old female.  Past medical history significant for depression, GERD,Hyperlipidemia, multiple myeloma, arthritis,  multiple compression fractures in her back,  melanoma, obesity vitamin D deficiency. She arrives virtually today to discuss her anxiety.  She reports of the last few weeks she has been feeling more nervous, anxious, irritable especially in the morning.  She reports she is having a harder time getting up and going.  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  She reports history of being on Lexapro for depression in the past but does not feel depressed at this time just feels anxious.  No suicidal ideation.  Would like to discuss treatment options. Has valium currently prescribed that she takes as needed for muscle spasms r/t to compression fractures in spine as opposed to anxiety.     Review of Systems  Cardiovascular:  Positive for palpitations. Negative for chest pain.   Psychiatric/Behavioral:  Negative for depression and suicidal ideas. The patient is nervous/anxious (especially in the morning). The patient does not have insomnia.       Objective:     LMP 03/06/2011  BP Readings from Last 3 Encounters:  05/03/23 122/74  04/12/23 112/72  03/13/23 121/77   Wt Readings from Last 3 Encounters:  05/03/23 150 lb (68 kg)  04/12/23 150 lb (68 kg)  03/13/23 153 lb (69.4 kg)        05/18/2023    9:36 AM 10/14/2022    4:09 PM 06/24/2020    9:19 AM  PHQ9 SCORE ONLY  PHQ-9 Total Score 0 0 0      05/18/2023    9:44 AM  GAD 7 : Generalized Anxiety Score  Nervous, Anxious, on Edge 3  Control/stop worrying 2  Worry too much - different things 2  Trouble relaxing 2  Restless 2  Easily annoyed or irritable 3  Afraid - awful might happen 3  Total GAD 7 Score 17       Physical Exam Comprehensive physical exam not completed today as office visit was conducted remotely.  Patient appears well over video.  Patient was alert and oriented, and appeared to have appropriate judgment.   No results found for any visits on 05/18/23.    The 10-year ASCVD risk score (Arnett DK, et al., 2019) is: 2.6%    Assessment & Plan:   Problem List Items Addressed This Visit       Other   Anxiety - Primary    Chronic,  currently not well-controlled. Based on last metabolic panel CrCl approximately 64. Discussed treatment options and recommend combination of counseling services as well as pharmacological treatment.  Patient is open to both modalities.  Referral to counseling services made today.  Will also initiate patient on BuSpar 10 mg tablet that she can take every 8 hours as needed for anxiety.  We also discussed the role of an antidepressant in the treatment of anxiety.  She would also like to discuss starting an antidepressant.  We discussed Lexapro versus SNRI, and she would like to trial an SNRI.  Will send in Pristiq 25 mg tablets, she will take 1 tablet  by mouth daily x 7 days then increase to 2 tablets by mouth daily.  Patient warned about risk of suicidal ideation as side effect to the Pristiq, and to notify us if this were to occur.  We also discussed other side effects related to Pristiq and what to do if these were to occur.  She was encouraged to follow-up in 6 to 8 weeks with her PCP to discuss how she is tolerating medication.  She reports her understanding.      Relevant Medications   busPIRone (BUSPAR) 10 MG tablet   desvenlafaxine (PRISTIQ) 25 MG 24 hr tablet   Other Relevant Orders   Ambulatory referral to Psychology    Return in about 6 weeks (around 06/29/2023) for F/U with Dr. Okey Dupre.    Elenore Paddy, NP

## 2023-05-18 NOTE — Assessment & Plan Note (Addendum)
Chronic, currently not well-controlled. Based on last metabolic panel CrCl approximately 64. Discussed treatment options and recommend combination of counseling services as well as pharmacological treatment.  Patient is open to both modalities.  Referral to counseling services made today.  Will also initiate patient on BuSpar 10 mg tablet that she can take every 8 hours as needed for anxiety.  We also discussed the role of an antidepressant in the treatment of anxiety.  She would also like to discuss starting an antidepressant.  We discussed Lexapro versus SNRI, and she would like to trial an SNRI.  Will send in Pristiq 25 mg tablets, she will take 1 tablet by mouth daily x 7 days then increase to 2 tablets by mouth daily.  Patient warned about risk of suicidal ideation as side effect to the Pristiq, and to notify us if this were to occur.  We also discussed other side effects related to Pristiq and what to do if these were to occur.  She was encouraged to follow-up in 6 to 8 weeks with her PCP to discuss how she is tolerating medication.  She reports her understanding.

## 2023-05-25 ENCOUNTER — Other Ambulatory Visit: Payer: Self-pay | Admitting: Nurse Practitioner

## 2023-05-25 DIAGNOSIS — C9000 Multiple myeloma not having achieved remission: Secondary | ICD-10-CM

## 2023-05-25 MED ORDER — OXYCODONE HCL 5 MG PO TABS: 5.0000 mg | ORAL_TABLET | Freq: Four times a day (QID) | ORAL | 0 refills | Status: DC | PRN

## 2023-05-25 NOTE — Telephone Encounter (Signed)
Forwarded to NP.

## 2023-05-29 ENCOUNTER — Encounter (INDEPENDENT_AMBULATORY_CARE_PROVIDER_SITE_OTHER): Payer: Self-pay

## 2023-05-29 ENCOUNTER — Encounter (INDEPENDENT_AMBULATORY_CARE_PROVIDER_SITE_OTHER): Payer: Self-pay | Admitting: Internal Medicine

## 2023-05-29 ENCOUNTER — Telehealth: Payer: Self-pay | Admitting: Internal Medicine

## 2023-05-29 ENCOUNTER — Other Ambulatory Visit: Payer: Self-pay | Admitting: Oncology

## 2023-05-29 ENCOUNTER — Ambulatory Visit (INDEPENDENT_AMBULATORY_CARE_PROVIDER_SITE_OTHER): Payer: 59 | Admitting: Internal Medicine

## 2023-05-29 ENCOUNTER — Telehealth (INDEPENDENT_AMBULATORY_CARE_PROVIDER_SITE_OTHER): Payer: Self-pay | Admitting: Internal Medicine

## 2023-05-29 VITALS — BP 135/75 | HR 54 | Temp 98.3°F | Ht 62.0 in | Wt 147.0 lb

## 2023-05-29 DIAGNOSIS — R638 Other symptoms and signs concerning food and fluid intake: Secondary | ICD-10-CM | POA: Diagnosis not present

## 2023-05-29 DIAGNOSIS — E669 Obesity, unspecified: Secondary | ICD-10-CM | POA: Diagnosis not present

## 2023-05-29 DIAGNOSIS — M810 Age-related osteoporosis without current pathological fracture: Secondary | ICD-10-CM

## 2023-05-29 DIAGNOSIS — Z6826 Body mass index (BMI) 26.0-26.9, adult: Secondary | ICD-10-CM

## 2023-05-29 DIAGNOSIS — C9000 Multiple myeloma not having achieved remission: Secondary | ICD-10-CM

## 2023-05-29 MED ORDER — ZEPBOUND 7.5 MG/0.5ML ~~LOC~~ SOAJ: 7.5000 mg | SUBCUTANEOUS | 1 refills | Status: DC

## 2023-05-29 NOTE — Assessment & Plan Note (Signed)
She is on tirzepatide 7.5 mg weekly for appetite control and has achieved gradual weight loss, with a current weight of 147 lbs and a goal of 140 lbs. We emphasized the importance of maintaining protein intake and physical activity to prevent muscle loss and support bone health, especially considering her history of osteoporosis and the risks associated with rapid weight loss, including a 1-2% reduction in bone mineral density. We plan to continue tirzepatide at the current dose, send a prescription refill to [facility], maintain her current levels of physical activity, ensure she consumes 30 grams of protein per meal, and monitor her weight, adjusting caloric intake as necessary. We will consider tapering the medication dose upon reaching the target weight.

## 2023-05-29 NOTE — Telephone Encounter (Signed)
Patient was put on buspiron by Jiles Prows - patient states this is giving her a headache and is not effective on her anxiety.  Can something else be sent in - Please advise patient - [phone]  Pharmacy:  [facility] - [id number]

## 2023-05-29 NOTE — Telephone Encounter (Signed)
Did she start pristiq as well? If so what is current dose and this can take up to 1 month to kick in.

## 2023-05-29 NOTE — Assessment & Plan Note (Signed)
Improved on Zepbound.  She has increased orexigenic signaling and cravings.  This is secondary to an abnormal energy regulation system and pathological neurohormonal pathways characteristic of excess adiposity.  In addition to nutritional and behavioral strategies she benefits from ongoing pharmacotherapy.  Continue Zepbound at current dose

## 2023-05-29 NOTE — Progress Notes (Signed)
Office: [phone]  /  Fax: [phone]  Weight Summary And Biometrics  Vitals Temp: 98.3 F (36.8 C) BP: 135/75 Pulse Rate: (!) 54 SpO2: 98 %   Anthropometric Measurements Height: 5\' 2"  (1.575 m) Weight: 147 lb (66.7 kg) BMI (Calculated): 26.88 Weight at Last Visit: 150 lb Weight Lost Since Last Visit: 3 lb Weight Gained Since Last Visit: 0 lb Starting Weight: 172 lb Total Weight Loss (lbs): 25 lb (11.3 kg) Peak Weight: 180 lb   Body Composition  Body Fat %: 34.7 % Fat Mass (lbs): 51.2 lbs Muscle Mass (lbs): 91.4 lbs Total Body Water (lbs): 64.8 lbs Visceral Fat Rating : 8    No data recorded Today's Visit #: 11  Starting Date: 07/13/22   Subjective   Chief Complaint: Obesity  Katherine Beard is here to discuss her progress with her obesity treatment plan. She is on the the Category 1 Plan and states she is following her eating plan approximately 80 % of the time. She states she is walking 30 minutes 7 times per week.  Interval History:   Discussed the use of AI scribe software for clinical note transcription with the patient, who gave verbal consent to proceed.  History of Present Illness   The patient, diagnosed with obesity, hyperlipidemia, osteoporosis, and multiple myeloma, presents for a follow-up visit focusing on weight management. She is currently on tirzepatide 7.5mg  once a week, which was recently increased to manage appetite.  The patient has lost a total of 33 pounds. She expresses a goal weight of 140lbs, despite acknowledging the potential impact on her bone mineral density due to her history of osteoporosis. She reports adherence to a regimen of calcium and vitamin D supplementation, is on antiresorptive medication, and weight-bearing exercises.  The patient describes a moderate level of hunger but denies any cravings. She reports feeling satiated after meals and has adopted mindful eating habits, focusing on portion control and prioritizing  vegetables and proteins over carbohydrates. She also mentions a recent episode of gastrointestinal upset while traveling, which she attributes to a dietary change.  The patient also reports chronic back pain, which she attributes to her history of multiple myeloma and associated spinal compression fractures. She is scheduled to see a spine deformity specialist for potential minimally invasive interventions. Despite the pain, she continues to engage in low-impact exercises and resistance training, as advised by her physical therapist.  The patient's weight loss journey is motivated by personal health goals and upcoming family events. She expresses satisfaction with her progress and a commitment to maintaining her new lifestyle habits.      Orexigenic Control:  Denies problems with appetite and hunger signals.  Denies problems with satiety and satiation.  Denies problems with eating patterns and portion control.  Denies abnormal cravings. Denies feeling deprived or restricted.   Barriers identified: orthopedic problems, medical conditions or chronic pain affecting mobility, medical comorbidities, and presence of obesogenic drugs.   Pharmacotherapy for weight loss: She is currently taking Zepbound with adequate clinical response  and without side effects..   Assessment and Plan   Treatment Plan For Obesity:  Recommended Dietary Goals  Katherine Beard is currently in the action stage of change. As such, her goal is to continue weight management plan. She has agreed to: continue current plan  Behavioral Intervention  We discussed the following Behavioral Modification Strategies today: continue to work on maintaining a reduced calorie state, getting the recommended amount of protein, incorporating whole foods, making healthy choices, staying well hydrated and  practicing mindfulness when eating..  Additional resources provided today: None  Recommended Physical Activity Goals  Katherine Beard has been  advised to work up to 150 minutes of moderate intensity aerobic activity a week and strengthening exercises 2-3 times per week for cardiovascular health, weight loss maintenance and preservation of muscle mass.   She has agreed to :  Continue current level of physical activity   Pharmacotherapy  We discussed various medication options to help Katherine Beard with her weight loss efforts and we both agreed to : continue current anti-obesity medication regimen  Associated Conditions Addressed Today  Osteoporosis, unspecified osteoporosis type, unspecified pathological fracture presence Assessment & Plan: Her osteoporosis, secondary to multiple myeloma, has improved to osteopenia. She receives antiresorptive therapy and takes calcium and vitamin D supplements. We emphasized the importance of diet and weight-bearing/resistance exercises for bone health. The plan is to continue medication, calcium and vitamin D supplementation, and engage in weight-bearing and resistance exercises as tolerated.   Abnormal food appetite Assessment & Plan: Improved on Zepbound.  She has increased orexigenic signaling and cravings.  This is secondary to an abnormal energy regulation system and pathological neurohormonal pathways characteristic of excess adiposity.  In addition to nutritional and behavioral strategies she benefits from ongoing pharmacotherapy.  Continue Zepbound at current dose   Orders: -     Zepbound; Inject 7.5 mg into the skin once a week.  Dispense: 2 mL; Refill: 1  Generalized obesity Assessment & Plan: She is on tirzepatide 7.5 mg weekly for appetite control and has achieved gradual weight loss, with a current weight of 147 lbs and a goal of 140 lbs. We emphasized the importance of maintaining protein intake and physical activity to prevent muscle loss and support bone health, especially considering her history of osteoporosis and the risks associated with rapid weight loss, including a 1-2% reduction  in bone mineral density. We plan to continue tirzepatide at the current dose, [facility], maintain her current levels of physical activity, ensure she consumes 30 grams of protein per meal, and monitor her weight, adjusting caloric intake as necessary. We will consider tapering the medication dose upon reaching the target weight.  Orders: -     Zepbound; Inject 7.5 mg into the skin once a week.  Dispense: 2 mL; Refill: 1      General Health Maintenance   We discussed maintaining a balanced diet, engaging in regular physical activity, and managing stress, emphasizing the importance of enjoying the holidays without feeling deprived. We recommended green tea or Oolong tea to boost metabolism. The plan is to follow a balanced diet with whole grains, vegetables, and lean proteins, engage in regular physical activity, practice mindful eating during the holidays, and consider drinking green tea or Oolong tea to boost metabolism.  Follow-up   We will schedule a follow-up appointment after the holidays and monitor weight, adjusting treatment as needed.             Objective   Physical Exam:  Blood pressure 135/75, pulse (!) 54, temperature 98.3 F (36.8 C), height 5\' 2"  (1.575 m), weight 147 lb (66.7 kg), last menstrual period 03/06/2011, SpO2 98%. Body mass index is 26.89 kg/m.  General: She is overweight, cooperative, alert, well developed, and in no acute distress. PSYCH: Has normal mood, affect and thought process.   HEENT: EOMI, sclerae are anicteric. Lungs: Normal breathing effort, no conversational dyspnea. Extremities: No edema.  Neurologic: No gross sensory or motor deficits. No tremors or fasciculations noted.  Diagnostic Data Reviewed:  BMET    Component Value Date/Time   NA 137 03/02/2023 1102   K 3.9 03/02/2023 1102   CL 102 03/02/2023 1102   CO2 28 03/02/2023 1102   GLUCOSE 87 03/02/2023 1102   BUN 21 (H) 03/02/2023 1102   CREATININE 0.85  03/02/2023 1102   CALCIUM 8.7 (L) 03/02/2023 1102   GFRNONAA >60 03/02/2023 1102   GFRAA 78 12/11/2006 1036   Lab Results  Component Value Date   HGBA1C 5.4 07/13/2022   HGBA1C 5.6 12/11/2006   Lab Results  Component Value Date   INSULIN 4.4 07/13/2022   Lab Results  Component Value Date   TSH 1.360 07/13/2022   CBC    Component Value Date/Time   WBC 2.9 (L) 03/02/2023 1102   WBC 3.8 (L) 09/28/2020 0958   RBC 4.29 03/02/2023 1102   HGB 13.5 03/02/2023 1102   HCT 39.5 03/02/2023 1102   PLT 225 03/02/2023 1102   MCV 92.1 03/02/2023 1102   MCH 31.5 03/02/2023 1102   MCHC 34.2 03/02/2023 1102   RDW 13.6 03/02/2023 1102   Iron Studies    Component Value Date/Time   IRON 65 07/08/2020 1509   FERRITIN 79.6 10/14/2022 1654   IRONPCTSAT 17.4 (L) 07/08/2020 1509   Lipid Panel     Component Value Date/Time   CHOL 224 (H) 01/16/2023 1115   TRIG 95 01/16/2023 1115   HDL 65 01/16/2023 1115   CHOLHDL 3.4 01/16/2023 1115   CHOLHDL 3 09/12/2019 0847   VLDL 14.8 09/12/2019 0847   LDLCALC 142 (H) 01/16/2023 1115   LDLDIRECT 173.7 11/26/2010 0756   Hepatic Function Panel     Component Value Date/Time   PROT 6.5 03/02/2023 1102   ALBUMIN 4.2 03/02/2023 1102   AST 15 03/02/2023 1102   ALT 16 03/02/2023 1102   ALKPHOS 24 (L) 03/02/2023 1102   BILITOT 0.4 03/02/2023 1102   BILIDIR 0.1 11/26/2010 0756   IBILI 0.4 11/30/2007 2242      Component Value Date/Time   TSH 1.360 07/13/2022 0931   Nutritional Lab Results  Component Value Date   VD25OH 77.3 01/16/2023   VD25OH 29.6 (L) 07/13/2022   VD25OH 69.83 07/08/2020    Follow-Up   No follow-ups on file.Marland Kitchen She was informed of the importance of frequent follow up visits to maximize her success with intensive lifestyle modifications for her multiple health conditions.  Attestation Statement   Reviewed by clinician on day of visit: allergies, medications, problem list, medical history, surgical history, family history,  social history, and previous encounter notes.     Worthy Rancher, MD

## 2023-05-29 NOTE — Telephone Encounter (Signed)
When checking out this patient we discovered Dr. Rikki Spearing doesn't have an available appointment until the beginning of January. This patient stated she would be out of her medicine for 2 weeks at time of her next appointment scheduled. Patient is asking for Dr. Rikki Spearing to refill her script on or around the 19th of December. Unfortunately this patient did not convey which medication.

## 2023-05-29 NOTE — Assessment & Plan Note (Signed)
Her osteoporosis, secondary to multiple myeloma, has improved to osteopenia. She receives antiresorptive therapy and takes calcium and vitamin D supplements. We emphasized the importance of diet and weight-bearing/resistance exercises for bone health. The plan is to continue medication, calcium and vitamin D supplementation, and engage in weight-bearing and resistance exercises as tolerated.

## 2023-05-30 ENCOUNTER — Encounter (INDEPENDENT_AMBULATORY_CARE_PROVIDER_SITE_OTHER): Payer: Self-pay

## 2023-05-30 NOTE — Telephone Encounter (Signed)
LVM - 1st attempt

## 2023-05-31 NOTE — Telephone Encounter (Signed)
Pristiq 25mg 

## 2023-05-31 NOTE — Telephone Encounter (Signed)
Spoke with patient she stated that the buspar is not working and it is causing her to have severe headaches and no relief at all and makes her very nauseous she has stopped taking it

## 2023-05-31 NOTE — Telephone Encounter (Signed)
It is the busPIRone (BUSPAR) 10 MG tablet  that is giving the pt headaches and she want to know can she get an alternative medication instead.

## 2023-06-05 ENCOUNTER — Inpatient Hospital Stay: Payer: 59

## 2023-06-05 ENCOUNTER — Other Ambulatory Visit: Payer: Self-pay | Admitting: *Deleted

## 2023-06-05 ENCOUNTER — Inpatient Hospital Stay: Payer: 59 | Attending: Oncology | Admitting: Oncology

## 2023-06-05 VITALS — BP 107/71 | HR 63 | Resp 18

## 2023-06-05 VITALS — BP 111/70 | HR 60 | Temp 98.1°F | Resp 18 | Ht 62.0 in | Wt 153.0 lb

## 2023-06-05 DIAGNOSIS — M810 Age-related osteoporosis without current pathological fracture: Secondary | ICD-10-CM | POA: Insufficient documentation

## 2023-06-05 DIAGNOSIS — C9000 Multiple myeloma not having achieved remission: Secondary | ICD-10-CM | POA: Diagnosis present

## 2023-06-05 DIAGNOSIS — C9001 Multiple myeloma in remission: Secondary | ICD-10-CM

## 2023-06-05 DIAGNOSIS — D709 Neutropenia, unspecified: Secondary | ICD-10-CM | POA: Insufficient documentation

## 2023-06-05 DIAGNOSIS — F32A Depression, unspecified: Secondary | ICD-10-CM | POA: Diagnosis not present

## 2023-06-05 DIAGNOSIS — Z85820 Personal history of malignant melanoma of skin: Secondary | ICD-10-CM | POA: Diagnosis not present

## 2023-06-05 LAB — CBC WITH DIFFERENTIAL (CANCER CENTER ONLY)
Abs Immature Granulocytes: 0 10*3/uL (ref 0.00–0.07)
Basophils Absolute: 0 10*3/uL (ref 0.0–0.1)
Basophils Relative: 1 %
Eosinophils Absolute: 0.1 10*3/uL (ref 0.0–0.5)
Eosinophils Relative: 2 %
HCT: 42 % (ref 36.0–46.0)
Hemoglobin: 14.4 g/dL (ref 12.0–15.0)
Immature Granulocytes: 0 %
Lymphocytes Relative: 30 %
Lymphs Abs: 0.8 10*3/uL (ref 0.7–4.0)
MCH: 32.1 pg (ref 26.0–34.0)
MCHC: 34.3 g/dL (ref 30.0–36.0)
MCV: 93.8 fL (ref 80.0–100.0)
Monocytes Absolute: 0.3 10*3/uL (ref 0.1–1.0)
Monocytes Relative: 10 %
Neutro Abs: 1.6 10*3/uL — ABNORMAL LOW (ref 1.7–7.7)
Neutrophils Relative %: 57 %
Platelet Count: 239 10*3/uL (ref 150–400)
RBC: 4.48 MIL/uL (ref 3.87–5.11)
RDW: 13.3 % (ref 11.5–15.5)
WBC Count: 2.8 10*3/uL — ABNORMAL LOW (ref 4.0–10.5)
nRBC: 0 % (ref 0.0–0.2)

## 2023-06-05 LAB — CMP (CANCER CENTER ONLY)
ALT: 12 U/L (ref 0–44)
AST: 15 U/L (ref 15–41)
Albumin: 4.5 g/dL (ref 3.5–5.0)
Alkaline Phosphatase: 23 U/L — ABNORMAL LOW (ref 38–126)
Anion gap: 11 (ref 5–15)
BUN: 18 mg/dL (ref 6–20)
CO2: 22 mmol/L (ref 22–32)
Calcium: 8.8 mg/dL — ABNORMAL LOW (ref 8.9–10.3)
Chloride: 105 mmol/L (ref 98–111)
Creatinine: 0.88 mg/dL (ref 0.44–1.00)
GFR, Estimated: >60 mL/min (ref >60)
Glucose, Bld: 86 mg/dL (ref 70–99)
Potassium: 3.9 mmol/L (ref 3.5–5.1)
Sodium: 138 mmol/L (ref 135–145)
Total Bilirubin: 0.5 mg/dL (ref <1.2)
Total Protein: 6.3 g/dL — ABNORMAL LOW (ref 6.5–8.1)

## 2023-06-05 MED ORDER — SODIUM CHLORIDE 0.9 % IV SOLN: Freq: Once | INTRAVENOUS | Status: AC

## 2023-06-05 MED ORDER — DIAZEPAM 5 MG PO TABS: 5.0000 mg | ORAL_TABLET | Freq: Two times a day (BID) | ORAL | 0 refills | Status: DC | PRN

## 2023-06-05 MED ORDER — ZOLEDRONIC ACID 4 MG/100ML IV SOLN
4.0000 mg | Freq: Once | INTRAVENOUS | Status: AC
  Administered 2023-06-05: 4 mg via INTRAVENOUS
  Filled 2023-06-05: qty 100

## 2023-06-05 MED ORDER — METHOCARBAMOL 500 MG PO TABS: ORAL_TABLET | ORAL | 1 refills | Status: DC

## 2023-06-05 NOTE — Progress Notes (Signed)
Patient seen by Dr. Thornton Papas today  Vitals are within treatment parameters:Yes   Labs are within treatment parameters: Yes   Treatment plan has been signed: Yes   Per physician team, Patient is ready for treatment and there are NO modifications to the treatment plan. Zometa today

## 2023-06-05 NOTE — Progress Notes (Signed)
[facility] OFFICE PROGRESS NOTE   Diagnosis: Multiple myeloma  INTERVAL HISTORY:   Ms. Crombie returns as scheduled.  She continues Revlimid maintenance.  No rash or neuropathy symptoms.  No diarrhea.  She is scheduled to begin another cycle of Revlimid on 06/07/2023.  No tooth or jaw pain.  She recently saw her dentist. Back pain has improved.  She is now taking 2-3 oxycodone tablets per day.  She continues exercise. Objective:  Vital signs in last 24 hours:  Blood pressure 111/70, pulse 60, temperature 98.1 F (36.7 C), temperature source Temporal, resp. rate 18, height 5\' 2"  (1.575 m), weight 153 lb (69.4 kg), last menstrual period 03/06/2011, SpO2 99%.    HEENT: No thrush or ulcers Resp: Lungs clear bilaterally Cardio: Regular rate and rhythm GI: No hepatosplenomegaly Vascular: No leg edema   Lab Results:  Lab Results  Component Value Date   WBC 2.8 (L) 06/05/2023   HGB 14.4 06/05/2023   HCT 42.0 06/05/2023   MCV 93.8 06/05/2023   PLT 239 06/05/2023   NEUTROABS 1.6 (L) 06/05/2023    CMP  Lab Results  Component Value Date   NA 137 03/02/2023   K 3.9 03/02/2023   CL 102 03/02/2023   CO2 28 03/02/2023   GLUCOSE 87 03/02/2023   BUN 21 (H) 03/02/2023   CREATININE 0.85 03/02/2023   CALCIUM 8.7 (L) 03/02/2023   PROT 6.5 03/02/2023   ALBUMIN 4.2 03/02/2023   AST 15 03/02/2023   ALT 16 03/02/2023   ALKPHOS 24 (L) 03/02/2023   BILITOT 0.4 03/02/2023   GFRNONAA >60 03/02/2023   GFRAA 78 12/11/2006    No results found for: "CEA1", "CEA", "NWG956", "CA125"  Lab Results  Component Value Date   INR 1.0 09/04/2020   LABPROT 13.1 09/04/2020    Imaging:  No results found.  Medications: I have reviewed the patient's current medications.   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 Increase serum free lambda light chains Serum M spike MRI pelvis 09/01/2020-innumerable small foci of signal abnormality concerning for multiple myeloma involvement Metastatic bone survey 09/03/2020-no discrete lytic bone lesions, compression fractures at T12, L1, L2, and L3, osteopenia Cycle 1 RVD 09/14/2020 Daratumumab beginning 09/28/2020 Cycle 2 RVD 10/05/2020 plus daratumumab Week 3 daratumumab 10/12/2020 Normal lambda light chains 10/26/2020 Cycle 3 RVD plus daratumumab 10/26/2020 Cycle 4 RVD plus daratumumab 11/16/2020, daratumumab changed to an every 2-week schedule beginning 11/23/2020 Cycle 5 RVD plus daratumumab 12/07/2020 Cycle 6 RVD plus daratumumab 12/28/2020 Normal lambda light chains 01/05/2021 Revlimid maintenance 21/28 days beginning 01/18/2021 Stem cell harvest at [facility] 03/29/2021 Revlimid resumed 03/30/2021 01/04/2023 normal light chains and negative serum immunofixation at [facility]   2. Severe back pain MRI lumbar spine July 12, 2020-subacute compression fractures at T12 and L1 MRI lumbar spine July 30, 2020-subacute T12 and L1 compression fractures, new subacute superior L2 endplate deformity MRI lumbar spine September 01, 2020 new/progressive compression fractures at L2 and L3, acute compression fracture at L4, evolution of T12 and L1 compression fractures MRI pelvis September 01, 2020-innumerable small foci of signal abnormality in the pelvis and femurs, no fracture identified, concerning for multiple myeloma versus metastases Zometa beginning 09/21/2020-plan monthly x3 then every 3 months, next due 11/19/2021 MRI thoracic spine July 2022-new T3 compression fracture with multiple additional thoracic compression fractures 3. Melanoma left upper back 2014 4. History  of diverticulitis 5. Mild hypercalcemia at presentation-resolved 6. Osteoporosis 7. [place] 8.  Depression-worsened depression symptoms 01/11/2021, referred to psychology   Disposition: Ms. Altergott appears stable.  She  continues Revlimid on a 3-week on/1 week off schedule.  She has stable mild neutropenia.  She will seek medical attention for symptoms of an infection.  She declined an influenza vaccine.  She continues every 9-month Zometa.  She is scheduled for follow-up at [facility] in January.  She will return for an office visit and Zometa in 3 months.  We will follow-up on the serum light chains from today.  Thornton Papas, MD  06/05/2023  9:24 AM

## 2023-06-05 NOTE — Patient Instructions (Signed)

## 2023-06-05 NOTE — Telephone Encounter (Signed)
When did she start pristiq and does she think it is helping at all? Ok to stay off buspar

## 2023-06-05 NOTE — Telephone Encounter (Signed)
Per MD request: Called in refills for valium and robaxin.

## 2023-06-06 LAB — KAPPA/LAMBDA LIGHT CHAINS
Kappa free light chain: 13.4 mg/L (ref 3.3–19.4)
Kappa, lambda light chain ratio: 1.51 (ref 0.26–1.65)
Lambda free light chains: 8.9 mg/L (ref 5.7–26.3)

## 2023-06-09 ENCOUNTER — Encounter: Payer: Self-pay | Admitting: *Deleted

## 2023-06-20 ENCOUNTER — Other Ambulatory Visit: Payer: Self-pay | Admitting: Nurse Practitioner

## 2023-06-20 DIAGNOSIS — C9000 Multiple myeloma not having achieved remission: Secondary | ICD-10-CM

## 2023-06-20 MED ORDER — OXYCODONE HCL 5 MG PO TABS
5.0000 mg | ORAL_TABLET | Freq: Four times a day (QID) | ORAL | 0 refills | Status: DC | PRN
Start: 2023-06-20 — End: 2023-07-20

## 2023-06-20 NOTE — Telephone Encounter (Signed)
Forwarded to NP.

## 2023-06-27 ENCOUNTER — Other Ambulatory Visit: Payer: Self-pay | Admitting: Oncology

## 2023-06-27 DIAGNOSIS — C9000 Multiple myeloma not having achieved remission: Secondary | ICD-10-CM

## 2023-07-03 ENCOUNTER — Telehealth (INDEPENDENT_AMBULATORY_CARE_PROVIDER_SITE_OTHER): Payer: Self-pay | Admitting: Internal Medicine

## 2023-07-03 ENCOUNTER — Other Ambulatory Visit: Payer: Self-pay | Admitting: Oncology

## 2023-07-03 ENCOUNTER — Encounter (INDEPENDENT_AMBULATORY_CARE_PROVIDER_SITE_OTHER): Payer: Self-pay | Admitting: Internal Medicine

## 2023-07-03 ENCOUNTER — Encounter: Payer: Self-pay | Admitting: Oncology

## 2023-07-03 NOTE — Telephone Encounter (Signed)
Pt called in stating she was told to call the office this week and request a refill for Zepbound. Please follow up with patient.

## 2023-07-03 NOTE — Telephone Encounter (Signed)
LVM for patient to call. Has she resumed this medicatiton?

## 2023-07-04 ENCOUNTER — Telehealth (INDEPENDENT_AMBULATORY_CARE_PROVIDER_SITE_OTHER): Payer: Self-pay | Admitting: Internal Medicine

## 2023-07-04 ENCOUNTER — Encounter: Payer: Self-pay | Admitting: Oncology

## 2023-07-04 NOTE — Telephone Encounter (Signed)
Patient was seen on 11/25. Pt started rx on 12/06, 12/13, 12/20, and  12/27. Pt is completely out of the medication. Patient wants to speak with Dr. Rikki Spearing today. She says she does not have an RX for any refills. [facility].

## 2023-07-04 NOTE — Telephone Encounter (Signed)
 Called [facility] and spoke with pharmacy staff. I just spoke with the [facility]ist at [facility], he stated the patient does have a refill available on the Zepbound . He also stated when she attempted to get it refilled it was too early but they are going to order it for her.

## 2023-07-04 NOTE — Telephone Encounter (Signed)
Called and spoke with patient, per patient she contacted the pharmacy and was told she did not have any refills. Explained to patient, to call the pharmacy and actually speak to someone since her last refill sent in November had an additional refill.

## 2023-07-06 ENCOUNTER — Ambulatory Visit (INDEPENDENT_AMBULATORY_CARE_PROVIDER_SITE_OTHER): Payer: 59 | Admitting: Internal Medicine

## 2023-07-06 ENCOUNTER — Encounter (INDEPENDENT_AMBULATORY_CARE_PROVIDER_SITE_OTHER): Payer: Self-pay | Admitting: Internal Medicine

## 2023-07-06 VITALS — BP 126/78 | HR 50 | Temp 97.7°F | Ht 62.0 in | Wt 146.0 lb

## 2023-07-06 DIAGNOSIS — Z6826 Body mass index (BMI) 26.0-26.9, adult: Secondary | ICD-10-CM | POA: Diagnosis not present

## 2023-07-06 DIAGNOSIS — R638 Other symptoms and signs concerning food and fluid intake: Secondary | ICD-10-CM | POA: Diagnosis not present

## 2023-07-06 DIAGNOSIS — E669 Obesity, unspecified: Secondary | ICD-10-CM

## 2023-07-06 NOTE — Assessment & Plan Note (Signed)
 Katherine Beard has lost 26 pounds since initiating medical weight loss.  She is following reduced calorie nutrition plan and is on tirzepatide  7.5 mg weekly for appetite control and has achieved gradual weight loss, with a current weight of 146 lbs and a goal of 140 lbs. she is tracking and journaling and engages in regular physical activity.  We emphasized the importance of maintaining protein intake and physical activity to prevent muscle loss and support bone health, especially considering her history of osteoporosis. We plan to continue tirzepatide  at the current dose.  Her goal is to be able to maintain her weight without the need for medication.

## 2023-07-06 NOTE — Telephone Encounter (Signed)
 Appt sched 07/06/23 at 2:20, refill will be sent in at appt

## 2023-07-06 NOTE — Assessment & Plan Note (Signed)
 Improved on Zepbound .  She has increased orexigenic signaling and cravings.  This is secondary to an abnormal energy regulation system and pathological neurohormonal pathways characteristic of excess adiposity.  In addition to nutritional and behavioral strategies she benefits from ongoing pharmacotherapy.  Continue Zepbound  at 7.5 mg once a week

## 2023-07-06 NOTE — Progress Notes (Signed)
 Office: [phone]  /  Fax: [phone]  Weight Summary And Biometrics  Vitals Temp: 97.7 F (36.5 C) BP: 126/78 Pulse Rate: (!) 50 SpO2: 98 %   Anthropometric Measurements Height: 5' 2 (1.575 m) Weight: 146 lb (66.2 kg) BMI (Calculated): 26.7 Weight at Last Visit: 147 lb Weight Lost Since Last Visit: 1 lb Weight Gained Since Last Visit: 0 Starting Weight: 172 lb Total Weight Loss (lbs): 26 lb (11.8 kg) Peak Weight: 180 lb   Body Composition  Body Fat %: 35.4 % Fat Mass (lbs): 51.8 lbs Muscle Mass (lbs): 89.6 lbs Total Body Water (lbs): 65.6 lbs Visceral Fat Rating : 8    No data recorded Today's Visit #: 12  Starting Date: 04/13/23   Subjective   Chief Complaint: Obesity  Katherine Beard is here to discuss her progress with her obesity treatment plan. She is on the the Category 1 Plan and states she is following her eating plan approximately 80-85 % of the time. She states she is exercising 30-60 minutes 5 times per week by walking and going to the gym.   Interval History:   Discussed the use of AI scribe software for clinical note transcription with the patient, who gave verbal consent to proceed.  History of Present Illness   She reports a recent weight loss of one pound, which she attributes to dietary changes and increased physical activity. Over the holiday period, she maintained a diet that was 80-85% compliant with recommendations and increased her exercise regimen. She also reports abstaining from alcohol due to its negative impact on her well-being.  Katherine Beard is currently on Zepbound  7.5 mg once a week for weight management, which she believes is effective in controlling her hunger and satiety. She expresses satisfaction with the current dosage and reports no concerns related to nutrition or medication. She has also joined a Facebook group for individuals on the same medication, which provides her with new recipes and ideas for maintaining a high-protein  diet.  In addition to her weight management efforts, Katherine Beard has been dealing with back issues. She recently consulted a spine specialist who suggested that she is doing everything she can to manage the condition. The specialist suggested a potential surgical intervention involving the removal of one or two lower ribs, but Katherine Beard has decided against this. She reports that weight loss has improved her comfort level.  Katherine Beard has been incorporating more physical activity into her routine, including weight exercises and increased time on the elliptical and treadmill. She has also been experimenting with different exercises to strengthen her core and improve her back condition. However, she notes that certain exercises, such as those involving carrying weight in front of her, exacerbate her back pain.  In terms of dietary habits, Katherine Beard reports being a routine person, often consuming the same meals. She expresses a desire to diversify her meals, particularly for lunch and dinner.  She also reports being mindful of her snack choices, aiming for snacks that will help her feel satiated until her next meal.  Overall, Katherine Beard is committed to her weight loss journey and is actively seeking ways to improve her diet and exercise routine. She expresses optimism about reaching her weight loss goal in the near future.        Orexigenic Control:  Reports improved problems with appetite and hunger signals.  Reports improved problems with satiety and satiation.  Denies problems with eating patterns and portion control.  Denies abnormal cravings. Denies feeling deprived or restricted.   Barriers  identified: orthopedic problems, medical conditions or chronic pain affecting mobility.   Pharmacotherapy for weight loss: She is currently taking Zepbound  with adequate clinical response  and without side effects..   Assessment and Plan   Treatment Plan For Obesity:  Recommended Dietary Goals  Katherine Beard is currently in the  action stage of change. As such, her goal is to continue weight management plan. She has agreed to: keep a food journal with a target of  1000 calories and 30-40 grams of protein per meal.  We discussed other sources for meal plans to avoid boredom and provide her with fresh ideas.  Behavioral Intervention  We discussed the following Behavioral Modification Strategies today: continue to work on maintaining a reduced calorie state, getting the recommended amount of protein, incorporating whole foods, making healthy choices, staying well hydrated and practicing mindfulness when eating..  Additional resources provided today: None  Recommended Physical Activity Goals  Katherine Beard has been advised to work up to 150 minutes of moderate intensity aerobic activity a week and strengthening exercises 2-3 times per week for cardiovascular health, weight loss maintenance and preservation of muscle mass.   She has agreed to :  Think about enjoyable ways to increase daily physical activity and overcoming barriers to exercise and Increase physical activity in their day and reduce sedentary time (increase NEAT).  Pharmacotherapy  We discussed various medication options to help Katherine Beard with her weight loss efforts and we both agreed to : continue current anti-obesity medication regimen.  She will remain on Zepbound  7.5 mg once a week for maintenance.  Her goal would be to come off the medication once she reaches her weight loss goal.  We discussed the elements required to be successful in this endeavor.  Associated Conditions Addressed and Impacted by Obesity Treatment  Generalized obesity with starting BMI 31 Assessment & Plan: Katherine Beard has lost 26 pounds since initiating medical weight loss.  She is following reduced calorie nutrition plan and is on tirzepatide  7.5 mg weekly for appetite control and has achieved gradual weight loss, with a current weight of 146 lbs and a goal of 140 lbs. she is tracking and journaling  and engages in regular physical activity.  We emphasized the importance of maintaining protein intake and physical activity to prevent muscle loss and support bone health, especially considering her history of osteoporosis. We plan to continue tirzepatide  at the current dose.  Her goal is to be able to maintain her weight without the need for medication.   Abnormal food appetite Assessment & Plan: Improved on Zepbound .  She has increased orexigenic signaling and cravings.  This is secondary to an abnormal energy regulation system and pathological neurohormonal pathways characteristic of excess adiposity.  In addition to nutritional and behavioral strategies she benefits from ongoing pharmacotherapy.  Continue Zepbound  at 7.5 mg once a week       Objective   Physical Exam:  Blood pressure 126/78, pulse (!) 50, temperature 97.7 F (36.5 C), height 5' 2 (1.575 m), weight 146 lb (66.2 kg), last menstrual period 03/06/2011, SpO2 98%. Body mass index is 26.7 kg/m.  General: She is overweight, cooperative, alert, well developed, and in no acute distress. PSYCH: Has normal mood, affect and thought process.   HEENT: EOMI, sclerae are anicteric. Lungs: Normal breathing effort, no conversational dyspnea. Extremities: No edema.  Neurologic: No gross sensory or motor deficits. No tremors or fasciculations noted.    Diagnostic Data Reviewed:  BMET    Component Value Date/Time   NA 138  06/05/2023 0844   K 3.9 06/05/2023 0844   CL 105 06/05/2023 0844   CO2 22 06/05/2023 0844   GLUCOSE 86 06/05/2023 0844   BUN 18 06/05/2023 0844   CREATININE 0.88 06/05/2023 0844   CALCIUM  8.8 (L) 06/05/2023 0844   GFRNONAA >60 06/05/2023 0844   GFRAA 78 12/11/2006 1036   Lab Results  Component Value Date   HGBA1C 5.4 07/13/2022   HGBA1C 5.6 12/11/2006   Lab Results  Component Value Date   INSULIN  4.4 07/13/2022   Lab Results  Component Value Date   TSH 1.360 07/13/2022   CBC    Component  Value Date/Time   WBC 2.8 (L) 06/05/2023 0844   WBC 3.8 (L) 09/28/2020 0958   RBC 4.48 06/05/2023 0844   HGB 14.4 06/05/2023 0844   HCT 42.0 06/05/2023 0844   PLT 239 06/05/2023 0844   MCV 93.8 06/05/2023 0844   MCH 32.1 06/05/2023 0844   MCHC 34.3 06/05/2023 0844   RDW 13.3 06/05/2023 0844   Iron Studies    Component Value Date/Time   IRON 65 07/08/2020 1509   FERRITIN 79.6 10/14/2022 1654   IRONPCTSAT 17.4 (L) 07/08/2020 1509   Lipid Panel     Component Value Date/Time   CHOL 224 (H) 01/16/2023 1115   TRIG 95 01/16/2023 1115   HDL 65 01/16/2023 1115   CHOLHDL 3.4 01/16/2023 1115   CHOLHDL 3 09/12/2019 0847   VLDL 14.8 09/12/2019 0847   LDLCALC 142 (H) 01/16/2023 1115   LDLDIRECT 173.7 11/26/2010 0756   Hepatic Function Panel     Component Value Date/Time   PROT 6.3 (L) 06/05/2023 0844   ALBUMIN 4.5 06/05/2023 0844   AST 15 06/05/2023 0844   ALT 12 06/05/2023 0844   ALKPHOS 23 (L) 06/05/2023 0844   BILITOT 0.5 06/05/2023 0844   BILIDIR 0.1 11/26/2010 0756   IBILI 0.4 11/30/2007 2242      Component Value Date/Time   TSH 1.360 07/13/2022 0931   Nutritional Lab Results  Component Value Date   VD25OH 77.3 01/16/2023   VD25OH 29.6 (L) 07/13/2022   VD25OH 69.83 07/08/2020    Follow-Up   Return in about 4 weeks (around 08/03/2023) for For Weight Mangement with Dr. Francyne.SABRA She was informed of the importance of frequent follow up visits to maximize her success with intensive lifestyle modifications for her multiple health conditions.  Attestation Statement   Reviewed by clinician on day of visit: allergies, medications, problem list, medical history, surgical history, family history, social history, and previous encounter notes.     Lucas Francyne, MD

## 2023-07-10 ENCOUNTER — Ambulatory Visit: Payer: 59 | Admitting: Internal Medicine

## 2023-07-19 ENCOUNTER — Other Ambulatory Visit: Payer: Self-pay | Admitting: Nurse Practitioner

## 2023-07-19 DIAGNOSIS — C9000 Multiple myeloma not having achieved remission: Secondary | ICD-10-CM

## 2023-07-20 ENCOUNTER — Other Ambulatory Visit: Payer: Self-pay | Admitting: Oncology

## 2023-07-20 DIAGNOSIS — C9000 Multiple myeloma not having achieved remission: Secondary | ICD-10-CM

## 2023-07-20 MED ORDER — OXYCODONE HCL 5 MG PO TABS
5.0000 mg | ORAL_TABLET | Freq: Three times a day (TID) | ORAL | 0 refills | Status: DC | PRN
Start: 2023-07-20 — End: 2023-08-17

## 2023-07-20 NOTE — Telephone Encounter (Signed)
Forwarded to MD.

## 2023-07-25 ENCOUNTER — Other Ambulatory Visit: Payer: Self-pay | Admitting: Oncology

## 2023-07-25 DIAGNOSIS — C9000 Multiple myeloma not having achieved remission: Secondary | ICD-10-CM

## 2023-08-02 ENCOUNTER — Encounter (INDEPENDENT_AMBULATORY_CARE_PROVIDER_SITE_OTHER): Payer: Self-pay | Admitting: Internal Medicine

## 2023-08-02 ENCOUNTER — Ambulatory Visit (INDEPENDENT_AMBULATORY_CARE_PROVIDER_SITE_OTHER): Payer: Self-pay | Admitting: Internal Medicine

## 2023-08-02 ENCOUNTER — Other Ambulatory Visit: Payer: Self-pay | Admitting: Oncology

## 2023-08-02 VITALS — BP 103/71 | HR 54 | Temp 97.3°F | Ht 62.0 in | Wt 141.0 lb

## 2023-08-02 DIAGNOSIS — Z6825 Body mass index (BMI) 25.0-25.9, adult: Secondary | ICD-10-CM | POA: Diagnosis not present

## 2023-08-02 DIAGNOSIS — E669 Obesity, unspecified: Secondary | ICD-10-CM | POA: Diagnosis not present

## 2023-08-02 DIAGNOSIS — C9000 Multiple myeloma not having achieved remission: Secondary | ICD-10-CM

## 2023-08-02 DIAGNOSIS — M810 Age-related osteoporosis without current pathological fracture: Secondary | ICD-10-CM | POA: Diagnosis not present

## 2023-08-02 DIAGNOSIS — R638 Other symptoms and signs concerning food and fluid intake: Secondary | ICD-10-CM

## 2023-08-02 MED ORDER — DIAZEPAM 5 MG PO TABS: 5.0000 mg | ORAL_TABLET | Freq: Two times a day (BID) | ORAL | 0 refills | Status: DC | PRN

## 2023-08-02 MED ORDER — ZEPBOUND 7.5 MG/0.5ML ~~LOC~~ SOAJ: 7.5000 mg | SUBCUTANEOUS | 1 refills | Status: DC

## 2023-08-02 NOTE — Progress Notes (Signed)
Office: [phone]  /  Fax: [phone]  Weight Summary And Biometrics  Vitals Temp: (!) 97.3 F (36.3 C) BP: 103/71 Pulse Rate: (!) 54 SpO2: 98 %   Anthropometric Measurements Height: 5\' 2"  (1.575 m) Weight: 141 lb (64 kg) BMI (Calculated): 25.78 Weight at Last Visit: 146 lb Weight Lost Since Last Visit: 5 lb Weight Gained Since Last Visit: 0 lb Starting Weight: 172 lb Total Weight Loss (lbs): 31 lb (14.1 kg) Peak Weight: 180 lb   Body Composition  Body Fat %: 30.8 % Fat Mass (lbs): 43.6 lbs Muscle Mass (lbs): 93.2 lbs Total Body Water (lbs): 62 lbs Visceral Fat Rating : 7    No data recorded Today's Visit #: 13  Starting Date: 04/13/23   Subjective   Chief Complaint: Obesity  Katherine Beard is here to discuss her progress with her obesity treatment plan. She is on the the Category 1 Plan and states she is following her eating plan approximately 90 % of the time. She states she is exercising 25-60 minutes 7 times per week.  Weight Progress Since Last Visit:  Discussed the use of AI scribe software for clinical note transcription with the patient, who gave verbal consent to proceed.  History of Present Illness   The patient presents for medical weight management. S  She is affected by obesity with a starting BMI of 31 and is currently on Zepbound 7.5 mg once a week. She adheres to a reduced calorie nutrition plan 90% of the time and exercises 25 to 60 minutes seven times per week. She has lost five pounds since the last office visit, with a reduction in body fat percentage and an increase in muscle mass. She is a pound away from her goal and has been engaging in more strength training, going to the gym three days a week with her husband.  She has a history of multiple myeloma and osteoporosis, which has resulted in compression fractures affecting her waist size. Despite significant weight loss, her clothing size has not changed much due to the need for space in the  waist area. She is motivated to build core strength to help with back pain and has been trying to wean off pain medication, currently down to maybe one a day.  She reports no side effects from her current medication, Zepbound, including no constipation. She has incorporated dietary changes such as eating a kiwi every morning and using cottage cheese in various recipes. She has also been consuming an apple a day and a small amount of pomegranate juice.       Challenges affecting patient progress: orthopedic problems, medical conditions or chronic pain affecting mobility.   Orexigenic Control: Denies problems with appetite and hunger signals.  Denies problems with satiety and satiation.  Denies problems with eating patterns and portion control.  Denies abnormal cravings. Denies feeling deprived or restricted.   Pharmacotherapy for weight management: She is currently taking Zepbound with adequate clinical response  and without side effects..   Assessment and Plan   Treatment Plan For Obesity:  Recommended Dietary Goals  Katherine Beard is currently in the action stage of change. As such, her goal is to continue weight management plan. She has agreed to: continue current plan  Behavioral Health and Counseling  We discussed the following behavioral modification strategies today: continue to work on maintaining a reduced calorie state, getting the recommended amount of protein, incorporating whole foods, making healthy choices, staying well hydrated and practicing mindfulness when eating..  Additional education  and resources provided today: None  Recommended Physical Activity Goals  Katherine Beard has been advised to work up to 150 minutes of moderate intensity aerobic activity a week and strengthening exercises 2-3 times per week for cardiovascular health, weight loss maintenance and preservation of muscle mass.   She has agreed to :  Think about enjoyable ways to increase daily physical activity and  overcoming barriers to exercise and Increase physical activity in their day and reduce sedentary time (increase NEAT).  Pharmacotherapy  We discussed various medication options to help Katherine Beard with her weight loss efforts and we both agreed to : adequate clinical response to current dose, continue current regimen  Associated Conditions Impacted by Obesity Treatment  Osteoporosis, unspecified osteoporosis type, unspecified pathological fracture presence  Abnormal food appetite -     Zepbound; Inject 7.5 mg into the skin once a week.  Dispense: 2 mL; Refill: 1  Generalized obesity -     Zepbound; Inject 7.5 mg into the skin once a week.  Dispense: 2 mL; Refill: 1    Assessment and Plan    Obesity Presents for medical weight management with a BMI of 31. Following a reduced-calorie nutrition plan and regular exercise. Lost 5 pounds since the last visit. Body fat percentage reduced from 35% to 30%, muscle mass increased from 89 pounds to 93 pounds. Currently on Zepbound 7.5 mg once a week with no side effects. Motivated and incorporating strength training. Discussed protein intake for muscle growth and potential caloric adjustment to maintain muscle mass post-goal weight.  - Encourage reduced-calorie nutrition plan and regular exercise - Monitor body composition and weight monthly - Increase protein intake as needed - Adjust caloric intake post-goal weight to maintain muscle mass  Osteoporosis Osteoporosis with back pain managed by exercise and occasional pain medication. Strengthening core muscles has significantly improved pain management and reduced pain medication usage. - Continue strength training and core exercises - Monitor pain levels and adjust pain medication as needed  General Health Maintenance Engaged in health maintenance with dietary adjustments and regular exercise. Incorporating healthy foods such as kiwi, cottage cheese, and pomegranate juice. Discussed benefits of these  choices and importance of a balanced diet. - Encourage healthy eating habits - Promote regular physical activity - Provide guidance on maintaining a balanced diet and exercise routine  Follow-up - Schedule follow-up appointment in one month - Send prescription refills to [facility] in [place].        Objective   Physical Exam:  Blood pressure 103/71, pulse (!) 54, temperature (!) 97.3 F (36.3 C), height 5\' 2"  (1.575 m), weight 141 lb (64 kg), last menstrual period 03/06/2011, SpO2 98%. Body mass index is 25.79 kg/m.  General: She is overweight, cooperative, alert, well developed, and in no acute distress. PSYCH: Has normal mood, affect and thought process.   HEENT: EOMI, sclerae are anicteric. Lungs: Normal breathing effort, no conversational dyspnea. Extremities: No edema.  Neurologic: No gross sensory or motor deficits. No tremors or fasciculations noted.    Diagnostic Data Reviewed:  BMET    Component Value Date/Time   NA 138 06/05/2023 0844   K 3.9 06/05/2023 0844   CL 105 06/05/2023 0844   CO2 22 06/05/2023 0844   GLUCOSE 86 06/05/2023 0844   BUN 18 06/05/2023 0844   CREATININE 0.88 06/05/2023 0844   CALCIUM 8.8 (L) 06/05/2023 0844   GFRNONAA >60 06/05/2023 0844   GFRAA 78 12/11/2006 1036   Lab Results  Component Value Date   HGBA1C 5.4 07/13/2022  HGBA1C 5.6 12/11/2006   Lab Results  Component Value Date   INSULIN 4.4 07/13/2022   Lab Results  Component Value Date   TSH 1.360 07/13/2022   CBC    Component Value Date/Time   WBC 2.8 (L) 06/05/2023 0844   WBC 3.8 (L) 09/28/2020 0958   RBC 4.48 06/05/2023 0844   HGB 14.4 06/05/2023 0844   HCT 42.0 06/05/2023 0844   PLT 239 06/05/2023 0844   MCV 93.8 06/05/2023 0844   MCH 32.1 06/05/2023 0844   MCHC 34.3 06/05/2023 0844   RDW 13.3 06/05/2023 0844   Iron Studies    Component Value Date/Time   IRON 65 07/08/2020 1509   FERRITIN 79.6 10/14/2022 1654   IRONPCTSAT 17.4 (L) 07/08/2020 1509    Lipid Panel     Component Value Date/Time   CHOL 224 (H) 01/16/2023 1115   TRIG 95 01/16/2023 1115   HDL 65 01/16/2023 1115   CHOLHDL 3.4 01/16/2023 1115   CHOLHDL 3 09/12/2019 0847   VLDL 14.8 09/12/2019 0847   LDLCALC 142 (H) 01/16/2023 1115   LDLDIRECT 173.7 11/26/2010 0756   Hepatic Function Panel     Component Value Date/Time   PROT 6.3 (L) 06/05/2023 0844   ALBUMIN 4.5 06/05/2023 0844   AST 15 06/05/2023 0844   ALT 12 06/05/2023 0844   ALKPHOS 23 (L) 06/05/2023 0844   BILITOT 0.5 06/05/2023 0844   BILIDIR 0.1 11/26/2010 0756   IBILI 0.4 11/30/2007 2242      Component Value Date/Time   TSH 1.360 07/13/2022 0931   Nutritional Lab Results  Component Value Date   VD25OH 77.3 01/16/2023   VD25OH 29.6 (L) 07/13/2022   VD25OH 69.83 07/08/2020    Follow-Up   Return in about 4 weeks (around 08/30/2023) for For Weight Mangement with Dr. Rikki Spearing.Marland Kitchen She was informed of the importance of frequent follow up visits to maximize her success with intensive lifestyle modifications for her multiple health conditions.  Attestation Statement   Reviewed by clinician on day of visit: allergies, medications, problem list, medical history, surgical history, family history, social history, and previous encounter notes.     Worthy Rancher, MD

## 2023-08-03 ENCOUNTER — Other Ambulatory Visit: Payer: Self-pay | Admitting: Oncology

## 2023-08-03 DIAGNOSIS — C9000 Multiple myeloma not having achieved remission: Secondary | ICD-10-CM

## 2023-08-16 ENCOUNTER — Encounter: Payer: Self-pay | Admitting: Oncology

## 2023-08-16 ENCOUNTER — Other Ambulatory Visit: Payer: Self-pay | Admitting: Oncology

## 2023-08-16 DIAGNOSIS — C9000 Multiple myeloma not having achieved remission: Secondary | ICD-10-CM

## 2023-08-17 ENCOUNTER — Other Ambulatory Visit: Payer: Self-pay | Admitting: Nurse Practitioner

## 2023-08-17 ENCOUNTER — Encounter: Payer: Self-pay | Admitting: Oncology

## 2023-08-17 DIAGNOSIS — C9000 Multiple myeloma not having achieved remission: Secondary | ICD-10-CM

## 2023-08-17 MED ORDER — OXYCODONE HCL 5 MG PO TABS: 5.0000 mg | ORAL_TABLET | Freq: Three times a day (TID) | ORAL | 0 refills | Status: DC | PRN

## 2023-08-22 ENCOUNTER — Other Ambulatory Visit: Payer: Self-pay | Admitting: Oncology

## 2023-08-22 DIAGNOSIS — C9000 Multiple myeloma not having achieved remission: Secondary | ICD-10-CM

## 2023-08-24 ENCOUNTER — Encounter: Payer: Self-pay | Admitting: Oncology

## 2023-08-28 ENCOUNTER — Other Ambulatory Visit: Payer: 59

## 2023-08-30 ENCOUNTER — Telehealth (INDEPENDENT_AMBULATORY_CARE_PROVIDER_SITE_OTHER): Payer: Self-pay | Admitting: Internal Medicine

## 2023-08-30 ENCOUNTER — Encounter (INDEPENDENT_AMBULATORY_CARE_PROVIDER_SITE_OTHER): Payer: Self-pay | Admitting: Internal Medicine

## 2023-08-30 VITALS — Ht 62.0 in | Wt 143.0 lb

## 2023-08-30 DIAGNOSIS — E669 Obesity, unspecified: Secondary | ICD-10-CM

## 2023-08-30 DIAGNOSIS — J069 Acute upper respiratory infection, unspecified: Secondary | ICD-10-CM

## 2023-08-30 DIAGNOSIS — Z6826 Body mass index (BMI) 26.0-26.9, adult: Secondary | ICD-10-CM

## 2023-08-30 DIAGNOSIS — R638 Other symptoms and signs concerning food and fluid intake: Secondary | ICD-10-CM | POA: Diagnosis not present

## 2023-08-30 MED ORDER — ZEPBOUND 7.5 MG/0.5ML ~~LOC~~ SOAJ: 7.5000 mg | SUBCUTANEOUS | 1 refills | Status: DC

## 2023-08-30 NOTE — Progress Notes (Signed)
 Virtual Visit via Video Note  I connected withNAME@ on 08/30/23 at 12:20 PM EST by a video enabled telemedicine application and verified that I am speaking with the correct person using two identifiers.  Location:  Patient: At home Provider: Office   I discussed the limitations of evaluation and management by telemedicine and the availability of in person appointments. The patient expressed understanding and agreed to proceed.    Weight Summary And Biometrics  Vitals BP: -- (video visit)   Anthropometric Measurements Height: 5\' 2"  (1.575 m) Weight: 143 lb (64.9 kg) (on home scale) BMI (Calculated): 26.15 Weight at Last Visit: 141 lb Weight Lost Since Last Visit: 0 Weight Gained Since Last Visit: 2 lb Starting Weight: 172 lb Total Weight Loss (lbs): 29 lb (13.2 kg) Peak Weight: 180 lb   No data recorded  No data recorded Today's Visit #: 14  Starting Date: 04/13/23   Subjective   Chief Complaint: Obesity  Katherine Beard is here to discuss her progress with her obesity treatment plan. She is on the the Category 1 Plan and states she is following her eating plan approximately 90 % of the time. She states she is exercising walking 3 days weekly, 20 minutes each of cardio, elliptical and treadmill; 3 times per week.  Weight Progress Since Last Visit:  Since last office visit she has maintained weight. She reports had recent lapse due to illness.  She has symptoms of a cold recently was at a family gathering She has been working on getting back on track has been mostly on a liquid diet.   Patient Reported Barriers to Progress: orthopedic problems, medical conditions or chronic pain affecting mobility and she has increased back pain limiting her ability to exercise .   Orexigenic Control: Denies problems with appetite and hunger signals.  Denies problems with satiety and satiation.  Denies problems with eating patterns and portion control.  Denies abnormal  cravings. Denies feeling deprived or restricted.   Pharmacotherapy for weight management: She is currently taking Zepbound with adequate clinical response  and without side effects..   Assessment and Plan   Treatment Plan For Obesity:  Recommended Dietary Goals  Markesia is currently in the action stage of change. As such, her goal is to continue weight management plan. She has agreed to: continue current plan  Behavioral Health and Counseling  We discussed the following behavioral modification strategies today: continue to work on maintaining a reduced calorie state, getting the recommended amount of protein, incorporating whole foods, making healthy choices, staying well hydrated and practicing mindfulness when eating..  Additional education and resources provided today: None  Recommended Physical Activity Goals  Katherine Beard has been advised to work up to 150 minutes of moderate intensity aerobic activity a week and strengthening exercises 2-3 times per week for cardiovascular health, weight loss maintenance and preservation of muscle mass.   She has agreed to :  Think about enjoyable ways to increase daily physical activity and overcoming barriers to exercise and Increase physical activity in their day and reduce sedentary time (increase NEAT).  Pharmacotherapy  We discussed various medication options to help Yohana with her weight loss efforts and we both agreed to : adequate clinical response to current dose, continue current regimen  Associated Conditions Impacted by Obesity Treatment  Viral upper respiratory tract infection Assessment & Plan: Patient is experiencing sore throat postnasal drip mild headache but no significant muscle aches.  She has been monitoring her temperature.  She is immunocompromised and is unvaccinated  for influenza.  Symptom onset about 2 days.  She will continue to monitor for worsening symptoms we also discussed the benefits of early use of antivirals to  avoid complications.  She will contact primary care team if her symptoms worsen as she is immunocompromise.  She will start taking zinc supplementation to shorten duration of cold.   Abnormal food appetite Assessment & Plan: Improved on Zepbound.  She had increased orexigenic signaling and cravings.  This is secondary to an abnormal energy regulation system and pathological neurohormonal pathways characteristic of excess adiposity.  In addition to nutritional and behavioral strategies she benefits from ongoing pharmacotherapy.  Continue Zepbound at 7.5 mg once a week   Orders: -     Zepbound; Inject 7.5 mg into the skin once a week.  Dispense: 2 mL; Refill: 1  Generalized obesity Assessment & Plan: [name] has lost so far 29 pounds since starting medically supervised weight management program inclusive of GLP-1.  She is tolerating medication well and denies any adverse effects.  She has had a lapse in her diet due to recent illness but is working on getting back on track as she recovers.  Orders: -     Zepbound; Inject 7.5 mg into the skin once a week.  Dispense: 2 mL; Refill: 1     Objective   Physical Exam:  Height 5\' 2"  (1.575 m), weight 143 lb (64.9 kg), last menstrual period 03/06/2011. Body mass index is 26.16 kg/m.  General: She is overweight, cooperative, alert, well developed, and in no acute distress. PSYCH: Has normal mood, affect and thought process.   Lungs: Normal breathing effort, no conversational dyspnea.    Diagnostic Data Reviewed:  BMET    Component Value Date/Time   NA 138 06/05/2023 0844   K 3.9 06/05/2023 0844   CL 105 06/05/2023 0844   CO2 22 06/05/2023 0844   GLUCOSE 86 06/05/2023 0844   BUN 18 06/05/2023 0844   CREATININE 0.88 06/05/2023 0844   CALCIUM 8.8 (L) 06/05/2023 0844   GFRNONAA >60 06/05/2023 0844   GFRAA 78 12/11/2006 1036   Lab Results  Component Value Date   HGBA1C 5.4 07/13/2022   HGBA1C 5.6 12/11/2006   Lab Results   Component Value Date   INSULIN 4.4 07/13/2022   Lab Results  Component Value Date   TSH 1.360 07/13/2022   CBC    Component Value Date/Time   WBC 2.8 (L) 06/05/2023 0844   WBC 3.8 (L) 09/28/2020 0958   RBC 4.48 06/05/2023 0844   HGB 14.4 06/05/2023 0844   HCT 42.0 06/05/2023 0844   PLT 239 06/05/2023 0844   MCV 93.8 06/05/2023 0844   MCH 32.1 06/05/2023 0844   MCHC 34.3 06/05/2023 0844   RDW 13.3 06/05/2023 0844   Iron Studies    Component Value Date/Time   IRON 65 07/08/2020 1509   FERRITIN 79.6 10/14/2022 1654   IRONPCTSAT 17.4 (L) 07/08/2020 1509   Lipid Panel     Component Value Date/Time   CHOL 224 (H) 01/16/2023 1115   TRIG 95 01/16/2023 1115   HDL 65 01/16/2023 1115   CHOLHDL 3.4 01/16/2023 1115   CHOLHDL 3 09/12/2019 0847   VLDL 14.8 09/12/2019 0847   LDLCALC 142 (H) 01/16/2023 1115   LDLDIRECT 173.7 11/26/2010 0756   Hepatic Function Panel     Component Value Date/Time   PROT 6.3 (L) 06/05/2023 0844   ALBUMIN 4.5 06/05/2023 0844   AST 15 06/05/2023 0844   ALT 12 06/05/2023  0844   ALKPHOS 23 (L) 06/05/2023 0844   BILITOT 0.5 06/05/2023 0844   BILIDIR 0.1 11/26/2010 0756   IBILI 0.4 11/30/2007 2242      Component Value Date/Time   TSH 1.360 07/13/2022 0931   Nutritional Lab Results  Component Value Date   VD25OH 77.3 01/16/2023   VD25OH 29.6 (L) 07/13/2022   VD25OH 69.83 07/08/2020    Follow-Up   No follow-ups on file.Marland Kitchen She was informed of the importance of frequent follow up visits to maximize her success with intensive lifestyle modifications for her multiple health conditions.  Attestation Statement   Reviewed by clinician on day of visit: allergies, medications, problem list, medical history, surgical history, family history, social history, and previous encounter notes.   I discussed the assessment and treatment plan with the patient. The patient was provided an opportunity to ask questions and all were answered. The patient agreed  with the plan and demonstrated an understanding of the instructions.   The patient was advised to call back or seek an in-person evaluation if the symptoms worsen or if the condition fails to improve as anticipated.   I have spent 30 minutes in the care of the patient today including: preparing to see patient (e.g. review and interpretation of tests, old notes ), obtaining and/or reviewing separately obtained history, performing a medically appropriate examination or evaluation, counseling and educating the patient, ordering medications, test or procedures, documenting clinical information in the electronic or other health care record, and independently interpreting results and communicating results to the patient, family, or caregiver   Worthy Rancher, MD

## 2023-08-30 NOTE — Assessment & Plan Note (Signed)
 Katherine Beard has lost so far 29 pounds since starting medically supervised weight management program inclusive of GLP-1.  She is tolerating medication well and denies any adverse effects.  She has had a lapse in her diet due to recent illness but is working on getting back on track as she recovers.

## 2023-08-30 NOTE — Assessment & Plan Note (Signed)
 Improved on Zepbound.  She had increased orexigenic signaling and cravings.  This is secondary to an abnormal energy regulation system and pathological neurohormonal pathways characteristic of excess adiposity.  In addition to nutritional and behavioral strategies she benefits from ongoing pharmacotherapy.  Continue Zepbound at 7.5 mg once a week

## 2023-08-30 NOTE — Assessment & Plan Note (Signed)
 Patient is experiencing sore throat postnasal drip mild headache but no significant muscle aches.  She has been monitoring her temperature.  She is immunocompromised and is unvaccinated for influenza.  Symptom onset about 2 days.  She will continue to monitor for worsening symptoms we also discussed the benefits of early use of antivirals to avoid complications.  She will contact primary care team if her symptoms worsen as she is immunocompromise.  She will start taking zinc supplementation to shorten duration of cold.

## 2023-08-30 NOTE — Progress Notes (Deleted)
 Office: [phone]  /  Fax: [phone]  Weight Summary And Biometrics  Vitals BP: -- (video visit)   Anthropometric Measurements Height: 5\' 2"  (1.575 m) Weight: 143 lb (64.9 kg) (on home scale) BMI (Calculated): 26.15 Weight at Last Visit: 141 lb Weight Lost Since Last Visit: 0 Weight Gained Since Last Visit: 2 lb Starting Weight: 172 lb Total Weight Loss (lbs): 29 lb (13.2 kg) Peak Weight: 180 lb   No data recorded  No data recorded Today's Visit #: 14  Starting Date: 04/13/23   Subjective   Chief Complaint: Obesity    Weight Progress Since Last Visit:  Since last office visit she has {emweight change:30888}. She reports {EMADHERENCE:28838::"good adherence to reduced calorie nutritional plan."} She has been working on [employer] labels","not skipping meals","increasing protein intake at every meal","drinking more water","making healthier choices","reducing portion sizes","incorporating more whole foods"}   Challenges affecting patient progress: {EMOBESITYBARRIERS:28841}.   Orexigenic Control: {Actions; denies-reports:32002} problems with appetite and hunger signals.  {Actions; denies-reports:32002} problems with satiety and satiation.  {Actions; denies-reports:32002} problems with eating patterns and portion control.  {Actions; denies-reports:32002} abnormal cravings. {Actions; denies-reports:32002} feeling deprived or restricted.   Pharmacotherapy for weight management: She is currently taking {EMPharmaco:28845}.   Assessment and Plan   Treatment Plan For Obesity:  Recommended Dietary Goals  Katherine Beard is currently in the action stage of change. As such, her goal is to continue weight management plan. She has agreed to: {EMWTLOSSPLAN:29297::"continue current plan"}  Behavioral Health and Counseling  We discussed the following behavioral modification strategies today: {EMWMwtlossstrategies:28914::"continue to work on maintaining  a reduced calorie state, getting the recommended amount of protein, incorporating whole foods, making healthy choices, staying well hydrated and practicing mindfulness when eating."}.  Additional education and resources provided today: {EMadditionalresources:29169::"None"}  Recommended Physical Activity Goals  Sarann has been advised to work up to 150 minutes of moderate intensity aerobic activity a week and strengthening exercises 2-3 times per week for cardiovascular health, weight loss maintenance and preservation of muscle mass.   She has agreed to :  {EMEXERCISE:28847::"Think about enjoyable ways to increase daily physical activity and overcoming barriers to exercise","Increase physical activity in their day and reduce sedentary time (increase NEAT)."}  Pharmacotherapy  We discussed various medication options to help Valia with her weight loss efforts and we both agreed to : {EMagreedrx:29170}  Associated Conditions Impacted by Obesity Treatment    Objective   Physical Exam:  Height 5\' 2"  (1.575 m), weight 143 lb (64.9 kg), last menstrual period 03/06/2011. Body mass index is 26.16 kg/m.  General: She is overweight, cooperative, alert, well developed, and in no acute distress. PSYCH: Has normal mood, affect and thought process.   HEENT: EOMI, sclerae are anicteric. Lungs: Normal breathing effort, no conversational dyspnea. Extremities: No edema.  Neurologic: No gross sensory or motor deficits. No tremors or fasciculations noted.    Diagnostic Data Reviewed:  BMET    Component Value Date/Time   NA 138 06/05/2023 0844   K 3.9 06/05/2023 0844   CL 105 06/05/2023 0844   CO2 22 06/05/2023 0844   GLUCOSE 86 06/05/2023 0844   BUN 18 06/05/2023 0844   CREATININE 0.88 06/05/2023 0844   CALCIUM 8.8 (L) 06/05/2023 0844   GFRNONAA >60 06/05/2023 0844   GFRAA 78 12/11/2006 1036   Lab Results  Component Value Date   HGBA1C 5.4 07/13/2022   HGBA1C 5.6 12/11/2006   Lab  Results  Component Value Date   INSULIN 4.4 07/13/2022   Lab Results  Component  Value Date   TSH 1.360 07/13/2022   CBC    Component Value Date/Time   WBC 2.8 (L) 06/05/2023 0844   WBC 3.8 (L) 09/28/2020 0958   RBC 4.48 06/05/2023 0844   HGB 14.4 06/05/2023 0844   HCT 42.0 06/05/2023 0844   PLT 239 06/05/2023 0844   MCV 93.8 06/05/2023 0844   MCH 32.1 06/05/2023 0844   MCHC 34.3 06/05/2023 0844   RDW 13.3 06/05/2023 0844   Iron Studies    Component Value Date/Time   IRON 65 07/08/2020 1509   FERRITIN 79.6 10/14/2022 1654   IRONPCTSAT 17.4 (L) 07/08/2020 1509   Lipid Panel     Component Value Date/Time   CHOL 224 (H) 01/16/2023 1115   TRIG 95 01/16/2023 1115   HDL 65 01/16/2023 1115   CHOLHDL 3.4 01/16/2023 1115   CHOLHDL 3 09/12/2019 0847   VLDL 14.8 09/12/2019 0847   LDLCALC 142 (H) 01/16/2023 1115   LDLDIRECT 173.7 11/26/2010 0756   Hepatic Function Panel     Component Value Date/Time   PROT 6.3 (L) 06/05/2023 0844   ALBUMIN 4.5 06/05/2023 0844   AST 15 06/05/2023 0844   ALT 12 06/05/2023 0844   ALKPHOS 23 (L) 06/05/2023 0844   BILITOT 0.5 06/05/2023 0844   BILIDIR 0.1 11/26/2010 0756   IBILI 0.4 11/30/2007 2242      Component Value Date/Time   TSH 1.360 07/13/2022 0931   Nutritional Lab Results  Component Value Date   VD25OH 77.3 01/16/2023   VD25OH 29.6 (L) 07/13/2022   VD25OH 69.83 07/08/2020    Follow-Up   No follow-ups on file.Marland Kitchen She was informed of the importance of frequent follow up visits to maximize her success with intensive lifestyle modifications for her multiple health conditions.  Attestation Statement   Reviewed by clinician on day of visit: allergies, medications, problem list, medical history, surgical history, family history, social history, and previous encounter notes.     Worthy Rancher, MD

## 2023-09-13 ENCOUNTER — Other Ambulatory Visit: Payer: Self-pay | Admitting: Nurse Practitioner

## 2023-09-13 ENCOUNTER — Other Ambulatory Visit: Payer: Self-pay | Admitting: Oncology

## 2023-09-13 DIAGNOSIS — C9000 Multiple myeloma not having achieved remission: Secondary | ICD-10-CM

## 2023-09-14 ENCOUNTER — Other Ambulatory Visit: Payer: Self-pay | Admitting: Oncology

## 2023-09-14 ENCOUNTER — Other Ambulatory Visit: Payer: Self-pay | Admitting: Nurse Practitioner

## 2023-09-14 DIAGNOSIS — C9000 Multiple myeloma not having achieved remission: Secondary | ICD-10-CM

## 2023-09-14 MED ORDER — DIAZEPAM 5 MG PO TABS: 5.0000 mg | ORAL_TABLET | Freq: Two times a day (BID) | ORAL | 0 refills | Status: DC | PRN

## 2023-09-14 MED ORDER — OXYCODONE HCL 5 MG PO TABS: 5.0000 mg | ORAL_TABLET | Freq: Three times a day (TID) | ORAL | 0 refills | Status: DC | PRN

## 2023-09-14 NOTE — Telephone Encounter (Signed)
 Forwarded to NP.

## 2023-09-15 ENCOUNTER — Encounter: Payer: Self-pay | Admitting: Oncology

## 2023-09-15 ENCOUNTER — Other Ambulatory Visit: Payer: Self-pay | Admitting: Nurse Practitioner

## 2023-09-15 DIAGNOSIS — C9000 Multiple myeloma not having achieved remission: Secondary | ICD-10-CM

## 2023-09-19 ENCOUNTER — Other Ambulatory Visit: Payer: Self-pay | Admitting: Oncology

## 2023-09-19 DIAGNOSIS — C9000 Multiple myeloma not having achieved remission: Secondary | ICD-10-CM

## 2023-09-25 ENCOUNTER — Inpatient Hospital Stay: Payer: 59 | Admitting: Nurse Practitioner

## 2023-09-25 ENCOUNTER — Inpatient Hospital Stay
Admission: RE | Admit: 2023-09-25 | Discharge: 2023-09-25 | Disposition: A | Discharge status: Home / Self Care | Payer: Self-pay | Source: Ambulatory Visit | Attending: Oncology | Admitting: Oncology

## 2023-09-25 ENCOUNTER — Inpatient Hospital Stay: Payer: 59 | Attending: Nurse Practitioner

## 2023-09-25 ENCOUNTER — Other Ambulatory Visit (HOSPITAL_BASED_OUTPATIENT_CLINIC_OR_DEPARTMENT_OTHER): Payer: Self-pay | Admitting: Oncology

## 2023-09-25 ENCOUNTER — Encounter: Payer: Self-pay | Admitting: Nurse Practitioner

## 2023-09-25 ENCOUNTER — Inpatient Hospital Stay: Payer: 59

## 2023-09-25 VITALS — BP 113/70 | HR 54 | Temp 98.2°F | Resp 18 | Ht 62.0 in | Wt 149.1 lb

## 2023-09-25 DIAGNOSIS — C9001 Multiple myeloma in remission: Secondary | ICD-10-CM | POA: Diagnosis not present

## 2023-09-25 DIAGNOSIS — R52 Pain, unspecified: Secondary | ICD-10-CM

## 2023-09-25 DIAGNOSIS — C9000 Multiple myeloma not having achieved remission: Secondary | ICD-10-CM

## 2023-09-25 DIAGNOSIS — M810 Age-related osteoporosis without current pathological fracture: Secondary | ICD-10-CM | POA: Insufficient documentation

## 2023-09-25 LAB — CMP (CANCER CENTER ONLY)
ALT: 14 U/L (ref 0–44)
AST: 16 U/L (ref 15–41)
Albumin: 4.2 g/dL (ref 3.5–5.0)
Alkaline Phosphatase: 22 U/L — ABNORMAL LOW (ref 38–126)
Anion gap: 7 (ref 5–15)
BUN: 19 mg/dL (ref 6–20)
CO2: 27 mmol/L (ref 22–32)
Calcium: 9 mg/dL (ref 8.9–10.3)
Chloride: 103 mmol/L (ref 98–111)
Creatinine: 0.81 mg/dL (ref 0.44–1.00)
GFR, Estimated: >60 mL/min (ref >60)
Glucose, Bld: 92 mg/dL (ref 70–99)
Potassium: 4 mmol/L (ref 3.5–5.1)
Sodium: 137 mmol/L (ref 135–145)
Total Bilirubin: 0.4 mg/dL (ref 0.0–1.2)
Total Protein: 6.3 g/dL — ABNORMAL LOW (ref 6.5–8.1)

## 2023-09-25 LAB — CBC WITH DIFFERENTIAL (CANCER CENTER ONLY)
Abs Immature Granulocytes: 0 10*3/uL (ref 0.00–0.07)
Basophils Absolute: 0 10*3/uL (ref 0.0–0.1)
Basophils Relative: 1 %
Eosinophils Absolute: 0.1 10*3/uL (ref 0.0–0.5)
Eosinophils Relative: 2 %
HCT: 40.6 % (ref 36.0–46.0)
Hemoglobin: 14 g/dL (ref 12.0–15.0)
Immature Granulocytes: 0 %
Lymphocytes Relative: 24 %
Lymphs Abs: 0.6 10*3/uL — ABNORMAL LOW (ref 0.7–4.0)
MCH: 32 pg (ref 26.0–34.0)
MCHC: 34.5 g/dL (ref 30.0–36.0)
MCV: 92.9 fL (ref 80.0–100.0)
Monocytes Absolute: 0.3 10*3/uL (ref 0.1–1.0)
Monocytes Relative: 11 %
Neutro Abs: 1.7 10*3/uL (ref 1.7–7.7)
Neutrophils Relative %: 62 %
Platelet Count: 198 10*3/uL (ref 150–400)
RBC: 4.37 MIL/uL (ref 3.87–5.11)
RDW: 12.6 % (ref 11.5–15.5)
WBC Count: 2.6 10*3/uL — ABNORMAL LOW (ref 4.0–10.5)
nRBC: 0 % (ref 0.0–0.2)

## 2023-09-25 MED ORDER — ZOLEDRONIC ACID 4 MG/100ML IV SOLN
4.0000 mg | Freq: Once | INTRAVENOUS | Status: AC
Start: 2023-09-25 — End: 2023-09-25
  Administered 2023-09-25: 4 mg via INTRAVENOUS
  Filled 2023-09-25: qty 100

## 2023-09-25 MED ORDER — SODIUM CHLORIDE 0.9 % IV SOLN: Freq: Once | INTRAVENOUS | Status: AC

## 2023-09-25 NOTE — Patient Instructions (Signed)

## 2023-09-25 NOTE — Progress Notes (Addendum)
 [facility] OFFICE PROGRESS NOTE   Diagnosis: Multiple myeloma  INTERVAL HISTORY:   Katherine Beard returns as scheduled.  She continues Revlimid  maintenance.  She denies nausea/vomiting.  No rash.  She has intermittently noted loose stools at the beginning of a Revlimid  cycle.  No diarrhea.  No neuropathy symptoms.  No mouth, tooth, jaw, gum issues.  She continues to have back pain.  She takes oxycodone  2-3 times a day.  She recently noted an area of increased protuberance at the mid back.  She saw Dr. Lamon Pillow who felt there was likely some progression of the lumbar kyphosis accounting for the deformity.  Objective:  Vital signs in last 24 hours:  Blood pressure 113/70, pulse (!) 54, temperature 98.2 F (36.8 C), resp. rate 18, height 5\' 2"  (1.575 m), weight 149 lb 1.6 oz (67.6 kg), last menstrual period 03/06/2011, SpO2 97%.    HEENT: No thrush or ulcers. Resp: Lungs clear bilaterally. Cardio: Regular rate and rhythm. GI: No hepatosplenomegaly. Vascular: No leg edema. Skin: No rash.   Lab Results:  Lab Results  Component Value Date   WBC 2.6 (L) 09/25/2023   HGB 14.0 09/25/2023   HCT 40.6 09/25/2023   MCV 92.9 09/25/2023   PLT 198 09/25/2023   NEUTROABS 1.7 09/25/2023    Imaging:  No results found.  Medications: I have reviewed the patient's current medications.  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 Increase serum free lambda light chains Serum M spike MRI pelvis 09/01/2020-innumerable small foci of signal abnormality concerning for multiple myeloma involvement Metastatic bone survey 09/03/2020-no discrete lytic bone lesions, compression fractures at T12, L1, L2, and L3, osteopenia Cycle 1 RVD 09/14/2020 Daratumumab  beginning 09/28/2020 Cycle 2 RVD 10/05/2020 plus daratumumab  Week 3  daratumumab  10/12/2020 Normal lambda light chains 10/26/2020 Cycle 3 RVD plus daratumumab  10/26/2020 Cycle 4 RVD plus daratumumab  11/16/2020, daratumumab  changed to an every 2-week schedule beginning 11/23/2020 Cycle 5 RVD plus daratumumab  12/07/2020 Cycle 6 RVD plus daratumumab  12/28/2020 Normal lambda light chains 01/05/2021 Revlimid  maintenance 21/28 days beginning 01/18/2021 Stem cell harvest at [facility] 03/29/2021 Revlimid  resumed 03/30/2021 01/04/2023 normal light chains and negative serum immunofixation at [facility] 07/19/2023 normal serum light chains and negative serum immunofixation at [facility] Maintenance Revlimid  continued   2. Severe back pain MRI lumbar spine July 12, 2020-subacute compression fractures at T12 and L1 MRI lumbar spine July 30, 2020-subacute T12 and L1 compression fractures, new subacute superior L2 endplate deformity MRI lumbar spine September 01, 2020 new/progressive compression fractures at L2 and L3, acute compression fracture at L4, evolution of T12 and L1 compression fractures MRI pelvis September 01, 2020-innumerable small foci of signal abnormality in the pelvis and femurs, no fracture identified, concerning for multiple myeloma versus metastases Zometa  beginning 09/21/2020-plan monthly x3 then every 3 months, next due 11/19/2021 MRI thoracic spine July 2022-new T3 compression fracture with multiple additional thoracic compression fractures 3. Melanoma left upper back 2014 4. History of diverticulitis 5. Mild hypercalcemia at presentation-resolved 6. Osteoporosis 7. [place] 8.  Depression-worsened depression symptoms 01/11/2021, referred to psychology  Disposition: [name] appears stable.  She continues maintenance Revlimid .  We will follow-up on the serum light chains from today.  She receives Zometa  every 3 months, infusion today.  CBC and chemistry panel adequate to continue Revlimid  as she is currently taking.  She has stable mild neutropenia.  She understands to  contact the office  with fever, chills, other signs of infection.  She has stable chronic back pain.  She would like to consider a kyphoplasty.  She will discuss further with Dr. Lamon Pillow.  She will return for follow-up and zometa  in 3 months.  She will contact the office in the interim with any problems.    Diana Forster ANP/GNP-BC   09/25/2023  11:52 AM

## 2023-09-25 NOTE — Progress Notes (Signed)
 Patient seen by Lonna Cobb NP today  Vitals are within treatment parameters:Yes   Labs are within treatment parameters: Yes   Treatment plan has been signed: Yes   Per physician team, Patient is ready for treatment and there are NO modifications to the treatment plan.

## 2023-09-26 LAB — KAPPA/LAMBDA LIGHT CHAINS
Kappa free light chain: 11.3 mg/L (ref 3.3–19.4)
Kappa, lambda light chain ratio: 1.28 (ref 0.26–1.65)
Lambda free light chains: 8.8 mg/L (ref 5.7–26.3)

## 2023-09-27 ENCOUNTER — Ambulatory Visit (INDEPENDENT_AMBULATORY_CARE_PROVIDER_SITE_OTHER): Payer: 59 | Admitting: Internal Medicine

## 2023-09-27 ENCOUNTER — Encounter (INDEPENDENT_AMBULATORY_CARE_PROVIDER_SITE_OTHER): Payer: Self-pay | Admitting: Internal Medicine

## 2023-09-27 ENCOUNTER — Ambulatory Visit: Admitting: Internal Medicine

## 2023-09-27 VITALS — BP 98/65 | HR 61 | Temp 98.4°F | Ht 62.0 in | Wt 141.0 lb

## 2023-09-27 DIAGNOSIS — E782 Mixed hyperlipidemia: Secondary | ICD-10-CM | POA: Diagnosis not present

## 2023-09-27 DIAGNOSIS — R638 Other symptoms and signs concerning food and fluid intake: Secondary | ICD-10-CM

## 2023-09-27 DIAGNOSIS — E669 Obesity, unspecified: Secondary | ICD-10-CM

## 2023-09-27 DIAGNOSIS — C9000 Multiple myeloma not having achieved remission: Secondary | ICD-10-CM

## 2023-09-27 DIAGNOSIS — J301 Allergic rhinitis due to pollen: Secondary | ICD-10-CM

## 2023-09-27 DIAGNOSIS — Z6825 Body mass index (BMI) 25.0-25.9, adult: Secondary | ICD-10-CM

## 2023-09-27 MED ORDER — TIRZEPATIDE-WEIGHT MANAGEMENT 10 MG/0.5ML ~~LOC~~ SOAJ: 10.0000 mg | SUBCUTANEOUS | 0 refills | Status: DC

## 2023-09-27 NOTE — Progress Notes (Signed)
 Office: [phone]  /  Fax: [phone]  Weight Summary And Biometrics  Vitals Temp: 98.4 F (36.9 C) BP: 98/65 Pulse Rate: 61 SpO2: 95 %   Anthropometric Measurements Height: 5\' 2"  (1.575 m) Weight: 141 lb (64 kg) BMI (Calculated): 25.78 Weight at Last Visit: 143 lb Weight Lost Since Last Visit: 2 lb Weight Gained Since Last Visit: 0 Starting Weight: 172 lb Total Weight Loss (lbs): 31 lb (14.1 kg) Peak Weight: 180 lb   Body Composition  Body Fat %: 31.4 % Fat Mass (lbs): 44.6 lbs Muscle Mass (lbs): 92.2 lbs Total Body Water (lbs): 62.8 lbs Visceral Fat Rating : 7    No data recorded Today's Visit #: 15  Starting Date: 04/13/23   Subjective   Chief Complaint: Obesity  Interval History Discussed the use of AI scribe software for clinical note transcription with the patient, who gave verbal consent to proceed.  History of Present Illness Katherine Beard is a 63 year old female with obesity who presents for medical weight management.  She has lost 31 pounds, which is 22% of her total body weight, on a medically supervised weight management plan. Since her last office visit, she has lost an additional 2 pounds. She is currently on Zepbound 7.5 mg once a week since June 2024. She follows a category one meal plan about 90% of the time, tracks her calories using an app, eats more whole foods, gets the recommended amount of protein, maintains adequate hydration, denies consumption of processed food, and does not skip meals. She exercises five days a week, including strengthening exercises and walking. She experiences high levels of stress and inadequate sleep.  She has hyperlipidemia and is taking rosuvastatin 20 mg daily. She plans to have her cholesterol levels checked as she has been on the medication for six months. There is a family history of hereditary high cholesterol.  She has multiple myeloma and is on Revlimid. Her condition is stable, and she continues  regular follow-ups with her oncologist. She receives Zometa infusions, which have improved her bone density from osteoporosis to osteopenia in her femurs. A bone density test six months ago showed improvement.  She experiences back pain due to compression fractures in her spine, which have worsened. She has consulted a spine doctor and had x-rays done. She uses a brace for support and is considering kyphoplasty, although there are concerns about potential risks due to her bone condition.  She takes Claritin daily for allergies but finds it ineffective. She experiences dry mouth and eyes, which she attributes to the medication.    Challenges affecting patient progress: orthopedic problems, medical conditions or chronic pain affecting mobility and medical comorbidities.    Pharmacotherapy for weight management: She is currently taking Zepbound with adequate clinical response  and without side effects..   Assessment and Plan   Treatment Plan For Obesity:  Recommended Dietary Goals  [name] is currently in the action stage of change. As such, her goal is to continue weight management plan. She has agreed to: continue current plan  Behavioral Health and Counseling  We discussed the following behavioral modification strategies today: continue to work on maintaining a reduced calorie state, getting the recommended amount of protein, incorporating whole foods, making healthy choices, staying well hydrated and practicing mindfulness when eating..  Additional education and resources provided today: None  Recommended Physical Activity Goals  Aylin has been advised to work up to 150 minutes of moderate intensity aerobic activity a week and strengthening exercises  2-3 times per week for cardiovascular health, weight loss maintenance and preservation of muscle mass.   She has agreed to :  Continue current level of physical activity   Pharmacotherapy  We discussed various medication options to  help [name] with her weight loss efforts and we both agreed to : increase Zepbound to 10 mg once a week for maintenance  Associated Conditions Impacted by Obesity Treatment  Generalized obesity with current BMI of 25 -     Tirzepatide-Weight Management; Inject 10 mg into the skin once a week.  Dispense: 2 mL; Refill: 0  Abnormal food appetite -     Tirzepatide-Weight Management; Inject 10 mg into the skin once a week.  Dispense: 2 mL; Refill: 0  Moderate mixed hyperlipidemia not requiring statin therapy  Multiple myeloma, remission status unspecified (HCC)  Seasonal allergic rhinitis due to pollen    Assessment and Plan Assessment & Plan Obesity She has lost 31 pounds, 22% of her total body weight, on a medically supervised weight management plan. Her BMI is now 25, and her body fat percentage is 31%, meeting her goal. She experiences increased hunger and larger portions, indicating a waning effect of the current Zepbound dose of 7.5 mg. She expressed a desire to increase the dose to 10 mg, a maintenance dose, to manage hunger and continue weight management. Discussed the importance of adequate protein intake, hydration, and the potential impact of further weight loss on bone density due to osteoporosis. She agreed to increase the dose to 10 mg and will monitor her response. - Increase Zepbound to 10 mg weekly - Ensure adequate protein intake and hydration - Monitor weight and body composition -She will like to reach a weight of 135  - Reassess in 4 weeks  Multiple Myeloma She is on Revlimid and Zometa for management. Recent labs indicate well-managed disease. Continues follow-up with her oncologist and myeloma specialist. Advised against additional calcium supplements due to hypercalcemia risk associated with multiple myeloma. - Continue Revlimid and Zometa as prescribed - Follow up with oncologist and myeloma specialist regularly  Osteoporosis Osteoporosis has improved to osteopenia  in her femurs due to Zometa treatment. Recent infusion improved bone density. Not taking additional calcium supplements as advised by her oncologist.  - Continue Zometa infusions - Monitor bone density - Ensure adequate dietary calcium intake  Hyperlipidemia She is on rosuvastatin 20 mg daily. Scheduled for lab work to check cholesterol levels after six months of treatment. - Continue rosuvastatin 20 mg daily - Check cholesterol levels with upcoming lab work  Allergic Rhinitis She reports daily use of Claritin but finds it ineffective. Discussed using nasofluticasone (Flonase) for better control of symptoms, including itchy eyes and nasal congestion. Explained proper technique for Flonase and the need for consistent use for optimal results. Claritin is less potent compared to Allegra and Zyrtec, which may explain its limited effectiveness. - Start nasofluticasone (Flonase) with two sprays in each nostril daily - Educate on proper use of Flonase - Continue Claritin if needed      Objective   Physical Exam:  Blood pressure 98/65, pulse 61, temperature 98.4 F (36.9 C), height 5\' 2"  (1.575 m), weight 141 lb (64 kg), last menstrual period 03/06/2011, SpO2 95%. Body mass index is 25.79 kg/m.  General: She is overweight, cooperative, alert, well developed, and in no acute distress. PSYCH: Has normal mood, affect and thought process.   HEENT: EOMI, sclerae are anicteric. Lungs: Normal breathing effort, no conversational dyspnea. Extremities: No edema.  Neurologic:  No gross sensory or motor deficits. No tremors or fasciculations noted.    Diagnostic Data Reviewed:  BMET    Component Value Date/Time   NA 137 09/25/2023 1107   K 4.0 09/25/2023 1107   CL 103 09/25/2023 1107   CO2 27 09/25/2023 1107   GLUCOSE 92 09/25/2023 1107   BUN 19 09/25/2023 1107   CREATININE 0.81 09/25/2023 1107   CALCIUM 9.0 09/25/2023 1107   GFRNONAA >60 09/25/2023 1107   GFRAA 78 12/11/2006 1036   Lab  Results  Component Value Date   HGBA1C 5.4 07/13/2022   HGBA1C 5.6 12/11/2006   Lab Results  Component Value Date   INSULIN 4.4 07/13/2022   Lab Results  Component Value Date   TSH 1.360 07/13/2022   CBC    Component Value Date/Time   WBC 2.6 (L) 09/25/2023 1107   WBC 3.8 (L) 09/28/2020 0958   RBC 4.37 09/25/2023 1107   HGB 14.0 09/25/2023 1107   HCT 40.6 09/25/2023 1107   PLT 198 09/25/2023 1107   MCV 92.9 09/25/2023 1107   MCH 32.0 09/25/2023 1107   MCHC 34.5 09/25/2023 1107   RDW 12.6 09/25/2023 1107   Iron Studies    Component Value Date/Time   IRON 65 07/08/2020 1509   FERRITIN 79.6 10/14/2022 1654   IRONPCTSAT 17.4 (L) 07/08/2020 1509   Lipid Panel     Component Value Date/Time   CHOL 224 (H) 01/16/2023 1115   TRIG 95 01/16/2023 1115   HDL 65 01/16/2023 1115   CHOLHDL 3.4 01/16/2023 1115   CHOLHDL 3 09/12/2019 0847   VLDL 14.8 09/12/2019 0847   LDLCALC 142 (H) 01/16/2023 1115   LDLDIRECT 173.7 11/26/2010 0756   Hepatic Function Panel     Component Value Date/Time   PROT 6.3 (L) 09/25/2023 1107   ALBUMIN 4.2 09/25/2023 1107   AST 16 09/25/2023 1107   ALT 14 09/25/2023 1107   ALKPHOS 22 (L) 09/25/2023 1107   BILITOT 0.4 09/25/2023 1107   BILIDIR 0.1 11/26/2010 0756   IBILI 0.4 11/30/2007 2242      Component Value Date/Time   TSH 1.360 07/13/2022 0931   Nutritional Lab Results  Component Value Date   VD25OH 77.3 01/16/2023   VD25OH 29.6 (L) 07/13/2022   VD25OH 69.83 07/08/2020    Medications: Outpatient Encounter Medications as of 09/27/2023  Medication Sig Note   acetaminophen (TYLENOL) 500 MG tablet Take 500 mg by mouth every 4 (four) hours as needed for moderate pain. Takes 2 tabs Q4H    COMBIPATCH 0.05-0.25 MG/DAY Place 1 patch onto the skin.    desvenlafaxine (PRISTIQ) 25 MG 24 hr tablet Take 1 tablet (25 mg total) by mouth daily.    desvenlafaxine (PRISTIQ) 25 MG 24 hr tablet Take 1 tablet by mouth daily.    diazepam (VALIUM) 5 MG  tablet Take 1 tablet (5 mg total) by mouth 2 (two) times daily as needed for anxiety or muscle spasms (Do not drive). Do not drive    diphenhydramine-acetaminophen (TYLENOL PM) 25-500 MG TABS tablet Take 2 tablets by mouth as needed.    Ibuprofen 200 MG CAPS Take 600 mg by mouth as needed. 09/21/2020: Takes 2-3 times/day   lenalidomide (REVLIMID) 10 MG capsule Take 1 capsule (10 mg total) by mouth daily. Take one by mouth daily x 21 days, then 7 day rest. Repeat every 28 days. Start cycle on 09/29/23    melatonin 5 MG TABS Take 10 mg by mouth at bedtime.  methocarbamol (ROBAXIN) 500 MG tablet TAKE 1 TABLET BY MOUTH TWICE A DAY AS NEEDED MUSCLE SPASM    Multiple Vitamin (MULTIVITAMIN) capsule Take 1 capsule by mouth daily.    Nutritional Supplements (VITAL HIGH PROTEIN PO) Take 1 Scoop by mouth daily. Puts in coffee    oxyCODONE (OXY IR/ROXICODONE) 5 MG immediate release tablet Take 1-2 tablets (5-10 mg total) by mouth every 8 (eight) hours as needed.    rosuvastatin (CRESTOR) 20 MG tablet Take 1 tablet (20 mg total) by mouth daily.    tirzepatide (ZEPBOUND) 10 MG/0.5ML Pen Inject 10 mg into the skin once a week.    [DISCONTINUED] tirzepatide (ZEPBOUND) 7.5 MG/0.5ML Pen Inject 7.5 mg into the skin once a week.    No facility-administered encounter medications on file as of 09/27/2023.     Follow-Up   Return in about 4 weeks (around 10/25/2023) for For Weight Mangement with Dr. Rikki Spearing.Marland Kitchen She was informed of the importance of frequent follow up visits to maximize her success with intensive lifestyle modifications for her multiple health conditions.  Attestation Statement   Reviewed by clinician on day of visit: allergies, medications, problem list, medical history, surgical history, family history, social history, and previous encounter notes.     Worthy Rancher, MD

## 2023-09-28 ENCOUNTER — Other Ambulatory Visit (INDEPENDENT_AMBULATORY_CARE_PROVIDER_SITE_OTHER): Payer: Self-pay | Admitting: Internal Medicine

## 2023-09-28 ENCOUNTER — Encounter: Payer: Self-pay | Admitting: Internal Medicine

## 2023-09-28 ENCOUNTER — Ambulatory Visit (INDEPENDENT_AMBULATORY_CARE_PROVIDER_SITE_OTHER): Admitting: Internal Medicine

## 2023-09-28 VITALS — BP 122/80 | HR 35 | Temp 97.5°F | Ht 62.0 in | Wt 145.0 lb

## 2023-09-28 DIAGNOSIS — E559 Vitamin D deficiency, unspecified: Secondary | ICD-10-CM

## 2023-09-28 DIAGNOSIS — F419 Anxiety disorder, unspecified: Secondary | ICD-10-CM

## 2023-09-28 DIAGNOSIS — E782 Mixed hyperlipidemia: Secondary | ICD-10-CM | POA: Diagnosis not present

## 2023-09-28 DIAGNOSIS — C9000 Multiple myeloma not having achieved remission: Secondary | ICD-10-CM | POA: Diagnosis not present

## 2023-09-28 DIAGNOSIS — E669 Obesity, unspecified: Secondary | ICD-10-CM

## 2023-09-28 DIAGNOSIS — R638 Other symptoms and signs concerning food and fluid intake: Secondary | ICD-10-CM

## 2023-09-28 LAB — CBC
HCT: 41.4 % (ref 36.0–46.0)
Hemoglobin: 14.1 g/dL (ref 12.0–15.0)
MCHC: 34 g/dL (ref 30.0–36.0)
MCV: 95.6 fl (ref 78.0–100.0)
Platelets: 204 10*3/uL (ref 150.0–400.0)
RBC: 4.33 Mil/uL (ref 3.87–5.11)
RDW: 13.7 % (ref 11.5–15.5)
WBC: 3.9 10*3/uL — ABNORMAL LOW (ref 4.0–10.5)

## 2023-09-28 LAB — LIPID PANEL
Cholesterol: 134 mg/dL (ref 0–200)
HDL: 59.4 mg/dL (ref >39.00)
LDL Cholesterol: 62 mg/dL (ref 0–99)
NonHDL: 74.33
Total CHOL/HDL Ratio: 2
Triglycerides: 60 mg/dL (ref 0.0–149.0)
VLDL: 12 mg/dL (ref 0.0–40.0)

## 2023-09-28 MED ORDER — HYDROXYZINE HCL 10 MG PO TABS: 10.0000 mg | ORAL_TABLET | Freq: Three times a day (TID) | ORAL | 0 refills | Status: DC | PRN

## 2023-09-28 NOTE — Assessment & Plan Note (Signed)
Checking vitamin D level and adjust as needed.  

## 2023-09-28 NOTE — Assessment & Plan Note (Signed)
Checking lipid panel and adjust crestor 20 mg daily.  

## 2023-09-28 NOTE — Patient Instructions (Signed)
 We will try increasing the pristiq to 50 mg daily (take 2 pills daily of the 25 mg) and if this works they make a 50 mg so we can send that in for you.  We have sent in the hydroxyzine to use as needed for anxiety.

## 2023-09-28 NOTE — Progress Notes (Signed)
   Subjective:   Patient ID: Katherine Beard, female    DOB: 01/08/1961, 63 y.o.   MRN: [id number]  HPI The patient is a 63 YO female coming in for start of pristiq. She is doing better with depression but still having some bouts of anxiety and tearfulness. Buspar did not help she is not using. Also wants recheck of cholesterol due to starting crestor.   Review of Systems  Constitutional: Negative.   HENT: Negative.    Eyes: Negative.   Respiratory:  Negative for cough, chest tightness and shortness of breath.   Cardiovascular:  Negative for chest pain, palpitations and leg swelling.  Gastrointestinal:  Negative for abdominal distention, abdominal pain, constipation, diarrhea, nausea and vomiting.  Musculoskeletal: Negative.   Skin: Negative.   Neurological: Negative.   Psychiatric/Behavioral:  Positive for dysphoric mood. Negative for sleep disturbance. The patient is not nervous/anxious.     Objective:  Physical Exam Constitutional:      Appearance: She is well-developed.  HENT:     Head: Normocephalic and atraumatic.  Cardiovascular:     Rate and Rhythm: Normal rate and regular rhythm.  Pulmonary:     Effort: Pulmonary effort is normal. No respiratory distress.     Breath sounds: Normal breath sounds. No wheezing or rales.  Abdominal:     General: Bowel sounds are normal. There is no distension.     Palpations: Abdomen is soft.     Tenderness: There is no abdominal tenderness. There is no rebound.  Musculoskeletal:     Cervical back: Normal range of motion.  Skin:    General: Skin is warm and dry.  Neurological:     Mental Status: She is alert and oriented to person, place, and time.     Coordination: Coordination normal.     Vitals:   09/28/23 1045  BP: 122/80  Pulse: (!) 35  Temp: (!) 97.5 F (36.4 C)  TempSrc: Oral  SpO2: 98%  Weight: 145 lb (65.8 kg)  Height: 5\' 2"  (1.575 m)    Assessment & Plan:

## 2023-09-28 NOTE — Assessment & Plan Note (Signed)
 Improved with pristiq 25 mg daily. Given some bouts of anxiety and tearfulness will increase to 50 mg daily. Rx hydroxyzine 10 mg to use as needed for panic episodes. If effective will send in pristiq 50 mg daily.

## 2023-09-28 NOTE — Assessment & Plan Note (Signed)
 We did discuss the emotional impact of this and she is struggling with this daily. She is using work and exercise and coping skills to help. Pristiq is helping some we did adjust the dose.

## 2023-09-29 LAB — FERRITIN: Ferritin: 109.9 ng/mL (ref 10.0–291.0)

## 2023-09-29 LAB — VITAMIN B12: Vitamin B-12: 516 pg/mL (ref 211–911)

## 2023-09-29 LAB — VITAMIN D 25 HYDROXY (VIT D DEFICIENCY, FRACTURES): VITD: 29.9 ng/mL — ABNORMAL LOW (ref 30.00–100.00)

## 2023-10-02 ENCOUNTER — Encounter: Payer: Self-pay | Admitting: Internal Medicine

## 2023-10-13 ENCOUNTER — Other Ambulatory Visit: Payer: Self-pay | Admitting: Nurse Practitioner

## 2023-10-13 ENCOUNTER — Other Ambulatory Visit: Payer: Self-pay | Admitting: Oncology

## 2023-10-13 DIAGNOSIS — C9000 Multiple myeloma not having achieved remission: Secondary | ICD-10-CM

## 2023-10-13 MED ORDER — METHOCARBAMOL 500 MG PO TABS: ORAL_TABLET | ORAL | 1 refills | Status: DC

## 2023-10-13 MED ORDER — OXYCODONE HCL 5 MG PO TABS: 5.0000 mg | ORAL_TABLET | Freq: Three times a day (TID) | ORAL | 0 refills | Status: DC | PRN

## 2023-10-25 ENCOUNTER — Encounter (INDEPENDENT_AMBULATORY_CARE_PROVIDER_SITE_OTHER): Payer: Self-pay | Admitting: Internal Medicine

## 2023-10-25 ENCOUNTER — Ambulatory Visit (INDEPENDENT_AMBULATORY_CARE_PROVIDER_SITE_OTHER): Admitting: Internal Medicine

## 2023-10-25 VITALS — BP 110/70 | HR 72 | Temp 98.2°F | Ht 62.0 in | Wt 138.0 lb

## 2023-10-25 DIAGNOSIS — R638 Other symptoms and signs concerning food and fluid intake: Secondary | ICD-10-CM

## 2023-10-25 DIAGNOSIS — E669 Obesity, unspecified: Secondary | ICD-10-CM | POA: Diagnosis not present

## 2023-10-25 DIAGNOSIS — M549 Dorsalgia, unspecified: Secondary | ICD-10-CM

## 2023-10-25 DIAGNOSIS — Z6825 Body mass index (BMI) 25.0-25.9, adult: Secondary | ICD-10-CM

## 2023-10-25 MED ORDER — TIRZEPATIDE-WEIGHT MANAGEMENT 10 MG/0.5ML ~~LOC~~ SOAJ: 10.0000 mg | SUBCUTANEOUS | 2 refills | Status: DC

## 2023-10-25 NOTE — Progress Notes (Deleted)
 Office: [phone]  /  Fax: [phone]  Weight Summary And Biometrics  Vitals Temp: 98.2 F (36.8 C) BP: 110/70 Pulse Rate: 72 SpO2: 100 %   Anthropometric Measurements Height: 5\' 2"  (1.575 m) Weight: 138 lb (62.6 kg) BMI (Calculated): 25.23 Weight at Last Visit: 141 lb Weight Lost Since Last Visit: 3 lb Weight Gained Since Last Visit: 0 lb Starting Weight: 172 lb Total Weight Loss (lbs): 34 lb (15.4 kg) Peak Weight: 180 lb   Body Composition  Body Fat %: 29.6 % Fat Mass (lbs): 41 lbs Muscle Mass (lbs): 92.2 lbs Total Body Water (lbs): 63.6 lbs Visceral Fat Rating : 7    No data recorded Today's Visit #: 16  Starting Date: 04/13/23   Subjective   Chief Complaint: Obesity  Interval History ***  Challenges affecting patient progress: {EMOBESITYBARRIERS:28841::"none"}.    Pharmacotherapy for weight management: She is currently taking {EMPharmaco:28845}.   Assessment and Plan   Treatment Plan For Obesity:  Recommended Dietary Goals  Ginger is currently in the action stage of change. As such, her goal is to continue weight management plan. She has agreed to: {EMWTLOSSPLAN:29297::"continue current plan"}  Behavioral Health and Counseling  We discussed the following behavioral modification strategies today: {EMWMwtlossstrategies:28914::"continue to work on maintaining a reduced calorie state, getting the recommended amount of protein, incorporating whole foods, making healthy choices, staying well hydrated and practicing mindfulness when eating."}.  Additional education and resources provided today: {EMadditionalresources:29169::"None"}  Recommended Physical Activity Goals  Maddox has been advised to work up to 150 minutes of moderate intensity aerobic activity a week and strengthening exercises 2-3 times per week for cardiovascular health, weight loss maintenance and preservation of muscle mass.   She has agreed to :  {EMEXERCISE:28847::"Think  about enjoyable ways to increase daily physical activity and overcoming barriers to exercise","Increase physical activity in their day and reduce sedentary time (increase NEAT)."}  Pharmacotherapy  We discussed various medication options to help Demita with her weight loss efforts and we both agreed to : {EMagreedrx:29170}  Associated Conditions Impacted by Obesity Treatment  Generalized obesity with current BMI of 25  Abnormal food appetite     ***  Objective   Physical Exam:  Blood pressure 110/70, pulse 72, temperature 98.2 F (36.8 C), height 5\' 2"  (1.575 m), weight 138 lb (62.6 kg), last menstrual period 03/06/2011, SpO2 100%. Body mass index is 25.24 kg/m.  General: She is overweight, cooperative, alert, well developed, and in no acute distress. PSYCH: Has normal mood, affect and thought process.   HEENT: EOMI, sclerae are anicteric. Lungs: Normal breathing effort, no conversational dyspnea. Extremities: No edema.  Neurologic: No gross sensory or motor deficits. No tremors or fasciculations noted.    Diagnostic Data Reviewed:  BMET    Component Value Date/Time   NA 137 09/25/2023 1107   K 4.0 09/25/2023 1107   CL 103 09/25/2023 1107   CO2 27 09/25/2023 1107   GLUCOSE 92 09/25/2023 1107   BUN 19 09/25/2023 1107   CREATININE 0.81 09/25/2023 1107   CALCIUM  9.0 09/25/2023 1107   GFRNONAA >60 09/25/2023 1107   GFRAA 78 12/11/2006 1036   Lab Results  Component Value Date   HGBA1C 5.4 07/13/2022   HGBA1C 5.6 12/11/2006   Lab Results  Component Value Date   INSULIN  4.4 07/13/2022   Lab Results  Component Value Date   TSH 1.360 07/13/2022   CBC    Component Value Date/Time   WBC 3.9 (L) 09/28/2023 1116   RBC 4.33  09/28/2023 1116   HGB 14.1 09/28/2023 1116   HGB 14.0 09/25/2023 1107   HCT 41.4 09/28/2023 1116   PLT 204.0 09/28/2023 1116   PLT 198 09/25/2023 1107   MCV 95.6 09/28/2023 1116   MCH 32.0 09/25/2023 1107   MCHC 34.0 09/28/2023 1116   RDW  13.7 09/28/2023 1116   Iron Studies    Component Value Date/Time   IRON 65 07/08/2020 1509   FERRITIN 109.9 09/28/2023 1116   IRONPCTSAT 17.4 (L) 07/08/2020 1509   Lipid Panel     Component Value Date/Time   CHOL 134 09/28/2023 1116   CHOL 224 (H) 01/16/2023 1115   TRIG 60.0 09/28/2023 1116   HDL 59.40 09/28/2023 1116   HDL 65 01/16/2023 1115   CHOLHDL 2 09/28/2023 1116   VLDL 12.0 09/28/2023 1116   LDLCALC 62 09/28/2023 1116   LDLCALC 142 (H) 01/16/2023 1115   LDLDIRECT 173.7 11/26/2010 0756   Hepatic Function Panel     Component Value Date/Time   PROT 6.3 (L) 09/25/2023 1107   ALBUMIN 4.2 09/25/2023 1107   AST 16 09/25/2023 1107   ALT 14 09/25/2023 1107   ALKPHOS 22 (L) 09/25/2023 1107   BILITOT 0.4 09/25/2023 1107   BILIDIR 0.1 11/26/2010 0756   IBILI 0.4 11/30/2007 2242      Component Value Date/Time   TSH 1.360 07/13/2022 0931   Nutritional Lab Results  Component Value Date   VD25OH 29.90 (L) 09/28/2023   VD25OH 77.3 01/16/2023   VD25OH 29.6 (L) 07/13/2022    Medications: Outpatient Encounter Medications as of 10/25/2023  Medication Sig Note   acetaminophen  (TYLENOL ) 500 MG tablet Take 500 mg by mouth every 4 (four) hours as needed for moderate pain. Takes 2 tabs Q4H    COMBIPATCH 0.05-0.25 MG/DAY Place 1 patch onto the skin.    desvenlafaxine  (PRISTIQ ) 25 MG 24 hr tablet Take 1 tablet (25 mg total) by mouth daily.    diazepam  (VALIUM ) 5 MG tablet Take 1 tablet (5 mg total) by mouth 2 (two) times daily as needed for anxiety or muscle spasms (Do not drive). Do not drive    diphenhydramine -acetaminophen  (TYLENOL  PM) 25-500 MG TABS tablet Take 2 tablets by mouth as needed.    hydrOXYzine  (ATARAX ) 10 MG tablet Take 1 tablet (10 mg total) by mouth 3 (three) times daily as needed.    Ibuprofen 200 MG CAPS Take 600 mg by mouth as needed. 09/21/2020: Takes 2-3 times/day   lenalidomide  (REVLIMID ) 10 MG capsule Take 10 mg by mouth daily x 21 days on, then 7 day rest.  Repeat every 28 days. Start cycle on 10/27/23    melatonin 5 MG TABS Take 10 mg by mouth at bedtime.    methocarbamol  (ROBAXIN ) 500 MG tablet TAKE 1 TABLET BY MOUTH TWICE A DAY AS NEEDED MUSCLE SPASM    Multiple Vitamin (MULTIVITAMIN) capsule Take 1 capsule by mouth daily.    Nutritional Supplements (VITAL HIGH PROTEIN PO) Take 1 Scoop by mouth daily. Puts in coffee    oxyCODONE  (OXY IR/ROXICODONE ) 5 MG immediate release tablet Take 1-2 tablets (5-10 mg total) by mouth every 8 (eight) hours as needed.    rosuvastatin  (CRESTOR ) 20 MG tablet Take 1 tablet (20 mg total) by mouth daily.    tirzepatide  (ZEPBOUND ) 10 MG/0.5ML Pen Inject 10 mg into the skin once a week.    No facility-administered encounter medications on file as of 10/25/2023.     Follow-Up   No follow-ups on file.Aaron Aas She was  informed of the importance of frequent follow up visits to maximize her success with intensive lifestyle modifications for her multiple health conditions.  Attestation Statement   Reviewed by clinician on day of visit: allergies, medications, problem list, medical history, surgical history, family history, social history, and previous encounter notes.     Ladd Picker, MD

## 2023-10-25 NOTE — Progress Notes (Signed)
 Office: [phone]  /  Fax: [phone]  Weight Summary And Biometrics  Vitals Temp: 98.2 F (36.8 C) BP: 110/70 Pulse Rate: 72 SpO2: 100 %   Anthropometric Measurements Height: 5\' 2"  (1.575 m) Weight: 138 lb (62.6 kg) BMI (Calculated): 25.23 Weight at Last Visit: 141 lb Weight Lost Since Last Visit: 3 lb Weight Gained Since Last Visit: 0 lb Starting Weight: 172 lb Total Weight Loss (lbs): 34 lb (15.4 kg) Peak Weight: 180 lb   Body Composition  Body Fat %: 29.6 % Fat Mass (lbs): 41 lbs Muscle Mass (lbs): 92.2 lbs Total Body Water (lbs): 63.6 lbs Visceral Fat Rating : 7    No data recorded Today's Visit #: 16  Starting Date: 04/13/23   Subjective   Chief Complaint: Obesity  Katherine Beard is here to discuss her progress with her obesity treatment plan. She is on the the Category 1 Plan and states she is following her eating plan approximately 70-80% of the time. She states she is exercising  30-60 minutes 5 times per week.  Weight Progress Since Last Visit:  Since last office visit she has lost 3 pounds. She reports good adherence to reduced calorie nutritional plan. She has been working on reading food labels, not skipping meals, increasing protein intake at every meal, drinking more water, making healthier choices, reducing portion sizes, and incorporating more whole foods   Challenges affecting patient progress: none.   Orexigenic Control: Denies problems with appetite and hunger signals.  Denies problems with satiety and satiation.  Denies problems with eating patterns and portion control.  Denies abnormal cravings. Denies feeling deprived or restricted.  Has noted some improvement in appetite with zepbound  10 mg once a day   Pharmacotherapy for weight management: She is currently taking Zepbound  with adequate clinical response  and without side effects..   Assessment and Plan   Treatment Plan For Obesity:  Recommended Dietary Goals  Katherine Beard is  currently in the action stage of change. As such, her goal is to continue weight management plan. She has agreed to: continue current plan  Behavioral Health and Counseling  We discussed the following behavioral modification strategies today: continue to work on maintaining a reduced calorie state, getting the recommended amount of protein, incorporating whole foods, making healthy choices, staying well hydrated and practicing mindfulness when eating..  Additional education and resources provided today: None  Recommended Physical Activity Goals  Katherine Beard has been advised to work up to 150 minutes of moderate intensity aerobic activity a week and strengthening exercises 2-3 times per week for cardiovascular health, weight loss maintenance and preservation of muscle mass.   She has agreed to :  Think about enjoyable ways to increase daily physical activity and overcoming barriers to exercise and Increase physical activity in their day and reduce sedentary time (increase NEAT).  Pharmacotherapy  We discussed various medication options to help Katherine Beard with her weight loss efforts and we both agreed to : adequate clinical response to anti-obesity medication, continue current regimen and do not recommend further increases in GLP-1 due to adequate clinical response   Associated Conditions Impacted by Obesity Treatment  BMI 25.0-25.9,adult  Generalized obesity with current BMI of 25 -     Tirzepatide -Weight Management; Inject 10 mg into the skin once a week.  Dispense: 2 mL; Refill: 2  Abnormal food appetite -     Tirzepatide -Weight Management; Inject 10 mg into the skin once a week.  Dispense: 2 mL; Refill: 2    Assessment and Plan  Obesity Obesity management is ongoing with a focus on weight loss and maintenance.  She has lost 42 pounds which is 23% of total body weight.  Current weight loss of three pounds achieved, with a goal weight of 135 pounds. Body fat percentage has decreased from  39% to 29%, and BMI has decreased from 32 to 25. She adheres to a reduced calorie nutrition plan of approximately 1000 calories per day, 85% of the time, and engages in regular exercise five days a week, including strength training, cardio, and yoga. No side effects reported from current weight loss medication. Consideration of tapering current medication and possibly introducing a different weight loss medication for maintenance. She is aware of the potential for slight weight gain when increasing caloric intake to 1300 calories per day due to muscle mass increase. - Continue current weight loss medication with three refills. - Maintain current exercise regimen and nutrition plan. - Plan to increase caloric intake to 1300 calories per day once goal weight is achieved. - Schedule follow-up appointment in three months.  Back pain Back pain is exacerbated by overactivity. Anticipates relief with the setup of a hot tub, expected to be completed soon. Weight loss has contributed to improved mobility and reduced back pain symptoms. - Utilize hot tub for symptomatic relief once set up is complete.  Abnormal food appetite Improved on GLP-1.  Currently on Zepbound  10 mg once a week.  Patient entering maintenance phase we discussed possibly cross tapering medication because of cost.  She will also work on increasing her calories to reach an isocaloric state.  Patient will benefit from AOM during maintenance phase          Objective   Physical Exam:  Blood pressure 110/70, pulse 72, temperature 98.2 F (36.8 C), height 5\' 2"  (1.575 m), weight 138 lb (62.6 kg), last menstrual period 03/06/2011, SpO2 100%. Body mass index is 25.24 kg/m.  General: She is overweight, cooperative, alert, well developed, and in no acute distress. PSYCH: Has normal mood, affect and thought process.   HEENT: EOMI, sclerae are anicteric. Lungs: Normal breathing effort, no conversational dyspnea. Extremities: No edema.   Neurologic: No gross sensory or motor deficits. No tremors or fasciculations noted.    Diagnostic Data Reviewed:  BMET    Component Value Date/Time   NA 137 09/25/2023 1107   K 4.0 09/25/2023 1107   CL 103 09/25/2023 1107   CO2 27 09/25/2023 1107   GLUCOSE 92 09/25/2023 1107   BUN 19 09/25/2023 1107   CREATININE 0.81 09/25/2023 1107   CALCIUM  9.0 09/25/2023 1107   GFRNONAA >60 09/25/2023 1107   GFRAA 78 12/11/2006 1036   Lab Results  Component Value Date   HGBA1C 5.4 07/13/2022   HGBA1C 5.6 12/11/2006   Lab Results  Component Value Date   INSULIN  4.4 07/13/2022   Lab Results  Component Value Date   TSH 1.360 07/13/2022   CBC    Component Value Date/Time   WBC 3.9 (L) 09/28/2023 1116   RBC 4.33 09/28/2023 1116   HGB 14.1 09/28/2023 1116   HGB 14.0 09/25/2023 1107   HCT 41.4 09/28/2023 1116   PLT 204.0 09/28/2023 1116   PLT 198 09/25/2023 1107   MCV 95.6 09/28/2023 1116   MCH 32.0 09/25/2023 1107   MCHC 34.0 09/28/2023 1116   RDW 13.7 09/28/2023 1116   Iron Studies    Component Value Date/Time   IRON 65 07/08/2020 1509   FERRITIN 109.9 09/28/2023 1116   IRONPCTSAT 17.4 (  L) 07/08/2020 1509   Lipid Panel     Component Value Date/Time   CHOL 134 09/28/2023 1116   CHOL 224 (H) 01/16/2023 1115   TRIG 60.0 09/28/2023 1116   HDL 59.40 09/28/2023 1116   HDL 65 01/16/2023 1115   CHOLHDL 2 09/28/2023 1116   VLDL 12.0 09/28/2023 1116   LDLCALC 62 09/28/2023 1116   LDLCALC 142 (H) 01/16/2023 1115   LDLDIRECT 173.7 11/26/2010 0756   Hepatic Function Panel     Component Value Date/Time   PROT 6.3 (L) 09/25/2023 1107   ALBUMIN 4.2 09/25/2023 1107   AST 16 09/25/2023 1107   ALT 14 09/25/2023 1107   ALKPHOS 22 (L) 09/25/2023 1107   BILITOT 0.4 09/25/2023 1107   BILIDIR 0.1 11/26/2010 0756   IBILI 0.4 11/30/2007 2242      Component Value Date/Time   TSH 1.360 07/13/2022 0931   Nutritional Lab Results  Component Value Date   VD25OH 29.90 (L)  09/28/2023   VD25OH 77.3 01/16/2023   VD25OH 29.6 (L) 07/13/2022    Medications: Outpatient Encounter Medications as of 10/25/2023  Medication Sig Note   acetaminophen  (TYLENOL ) 500 MG tablet Take 500 mg by mouth every 4 (four) hours as needed for moderate pain. Takes 2 tabs Q4H    COMBIPATCH 0.05-0.25 MG/DAY Place 1 patch onto the skin.    desvenlafaxine  (PRISTIQ ) 25 MG 24 hr tablet Take 1 tablet (25 mg total) by mouth daily.    diazepam  (VALIUM ) 5 MG tablet Take 1 tablet (5 mg total) by mouth 2 (two) times daily as needed for anxiety or muscle spasms (Do not drive). Do not drive    diphenhydramine -acetaminophen  (TYLENOL  PM) 25-500 MG TABS tablet Take 2 tablets by mouth as needed.    hydrOXYzine  (ATARAX ) 10 MG tablet Take 1 tablet (10 mg total) by mouth 3 (three) times daily as needed.    Ibuprofen 200 MG CAPS Take 600 mg by mouth as needed. 09/21/2020: Takes 2-3 times/day   lenalidomide  (REVLIMID ) 10 MG capsule Take 10 mg by mouth daily x 21 days on, then 7 day rest. Repeat every 28 days. Start cycle on 10/27/23    melatonin 5 MG TABS Take 10 mg by mouth at bedtime.    methocarbamol  (ROBAXIN ) 500 MG tablet TAKE 1 TABLET BY MOUTH TWICE A DAY AS NEEDED MUSCLE SPASM    Multiple Vitamin (MULTIVITAMIN) capsule Take 1 capsule by mouth daily.    Nutritional Supplements (VITAL HIGH PROTEIN PO) Take 1 Scoop by mouth daily. Puts in coffee    oxyCODONE  (OXY IR/ROXICODONE ) 5 MG immediate release tablet Take 1-2 tablets (5-10 mg total) by mouth every 8 (eight) hours as needed.    rosuvastatin  (CRESTOR ) 20 MG tablet Take 1 tablet (20 mg total) by mouth daily.    [DISCONTINUED] tirzepatide  (ZEPBOUND ) 10 MG/0.5ML Pen Inject 10 mg into the skin once a week.    tirzepatide  (ZEPBOUND ) 10 MG/0.5ML Pen Inject 10 mg into the skin once a week.    No facility-administered encounter medications on file as of 10/25/2023.     Follow-Up   Return in about 3 months (around 01/24/2024) for For Weight Mangement with Dr.  Allie Area.Aaron Aas She was informed of the importance of frequent follow up visits to maximize her success with intensive lifestyle modifications for her multiple health conditions.  Attestation Statement   Reviewed by clinician on day of visit: allergies, medications, problem list, medical history, surgical history, family history, social history, and previous encounter notes.  Ladd Picker, MD

## 2023-10-30 ENCOUNTER — Other Ambulatory Visit (INDEPENDENT_AMBULATORY_CARE_PROVIDER_SITE_OTHER): Payer: Self-pay | Admitting: Internal Medicine

## 2023-10-30 DIAGNOSIS — E669 Obesity, unspecified: Secondary | ICD-10-CM

## 2023-10-30 DIAGNOSIS — R638 Other symptoms and signs concerning food and fluid intake: Secondary | ICD-10-CM

## 2023-10-31 ENCOUNTER — Ambulatory Visit (INDEPENDENT_AMBULATORY_CARE_PROVIDER_SITE_OTHER): Admitting: Internal Medicine

## 2023-11-07 ENCOUNTER — Other Ambulatory Visit: Payer: Self-pay | Admitting: Nurse Practitioner

## 2023-11-07 DIAGNOSIS — C9000 Multiple myeloma not having achieved remission: Secondary | ICD-10-CM

## 2023-11-07 MED ORDER — DIAZEPAM 5 MG PO TABS: 5.0000 mg | ORAL_TABLET | Freq: Two times a day (BID) | ORAL | 0 refills | Status: DC | PRN

## 2023-11-07 MED ORDER — OXYCODONE HCL 5 MG PO TABS: 5.0000 mg | ORAL_TABLET | Freq: Three times a day (TID) | ORAL | 0 refills | Status: DC | PRN

## 2023-11-07 NOTE — Telephone Encounter (Signed)
 Forwarded to NP.

## 2023-11-09 ENCOUNTER — Other Ambulatory Visit: Payer: Self-pay | Admitting: Nurse Practitioner

## 2023-11-09 DIAGNOSIS — F419 Anxiety disorder, unspecified: Secondary | ICD-10-CM

## 2023-11-14 ENCOUNTER — Other Ambulatory Visit: Payer: Self-pay | Admitting: *Deleted

## 2023-11-14 ENCOUNTER — Other Ambulatory Visit: Payer: Self-pay | Admitting: Oncology

## 2023-11-14 DIAGNOSIS — C9000 Multiple myeloma not having achieved remission: Secondary | ICD-10-CM

## 2023-11-14 MED ORDER — LENALIDOMIDE 10 MG PO CAPS: ORAL_CAPSULE | ORAL | 0 refills | Status: DC

## 2023-11-14 MED ORDER — LENALIDOMIDE 10 MG PO CAPS
ORAL_CAPSULE | ORAL | 0 refills | Status: DC
Start: 2023-11-14 — End: 2023-11-14

## 2023-11-15 ENCOUNTER — Other Ambulatory Visit: Payer: Self-pay | Admitting: Oncology

## 2023-11-15 ENCOUNTER — Other Ambulatory Visit (HOSPITAL_COMMUNITY): Payer: Self-pay

## 2023-11-15 DIAGNOSIS — C9000 Multiple myeloma not having achieved remission: Secondary | ICD-10-CM

## 2023-11-16 ENCOUNTER — Telehealth: Payer: Self-pay | Admitting: Pharmacy Technician

## 2023-11-16 NOTE — Telephone Encounter (Signed)
 Oral Oncology Patient Advocate Encounter   Received notification that prior authorization for Revlimid  is due for renewal.   PA needs to be submitted via fax due to insurance requirements.   I have completed PA but still need MD signature.   I have sent PA via email to Rogene Claude, RN. Once I receive it back signed I will submit.     Patty Benjaman Branch, CPhT Oncology Pharmacy Patient Advocate [facility] Direct Number: [phone] Fax: [phone]

## 2023-11-16 NOTE — Telephone Encounter (Signed)
 Oral Oncology Patient Advocate Encounter   Received notification that prior authorization for Revlimid  is due for renewal.   PA submitted on 11/16/2023 Key n/a submitted via fax due to insurance requirements Status is pending  [employer] [place]  [facility]: Phone - [phone] or [phone] Fax - [phone] or [phone]  Patty Benjaman Branch, CPhT Oncology Pharmacy Patient Advocate [facility] Direct Number: [phone] Fax: [phone]

## 2023-11-20 ENCOUNTER — Encounter: Payer: Self-pay | Admitting: Oncology

## 2023-11-20 NOTE — Telephone Encounter (Signed)
 Oral Oncology Patient Advocate Encounter  Prior Authorization for Lenalidomide  has been approved.    PA# per insurance rep no PA number  Effective dates: 11/17/2023 through 11/16/2024  Patient can continue to fill at [facility] specialty pharmacy.    Patty Benjaman Branch, CPhT Oncology Pharmacy Patient Advocate [facility] Direct Number: [phone] Fax: [phone]

## 2023-11-29 ENCOUNTER — Encounter: Payer: Self-pay | Admitting: *Deleted

## 2023-11-29 ENCOUNTER — Telehealth: Payer: Self-pay | Admitting: Nurse Practitioner

## 2023-11-29 ENCOUNTER — Encounter: Payer: Self-pay | Admitting: Oncology

## 2023-11-29 ENCOUNTER — Telehealth: Payer: Self-pay | Admitting: *Deleted

## 2023-11-29 DIAGNOSIS — C9000 Multiple myeloma not having achieved remission: Secondary | ICD-10-CM

## 2023-11-29 NOTE — Telephone Encounter (Signed)
 Patient has been scheduled for follow-up visit per 11/29/23 LOS.  LVM notifying pt of appt details, provided my direct number to pt if appt changes need to be made.

## 2023-11-29 NOTE — Telephone Encounter (Signed)
 Called Katherine Beard in response to [employer]. She reports new pain beginning 2 weeks ago on both sides of lower torso above hips that radiates to her back. Also her lower back pain is much worse and needing to take more oxycodone /methocarbamol . Pain awakens her at night. No leg weakness or neuro symptoms. Also reports extreme fatigue/lethargy and sleeps all the time. Per Dr. Scherrie Curt: Appointment with [facility] 5/29 with labs. High priority scheduling message sent.

## 2023-11-29 NOTE — Progress Notes (Deleted)
 [facility]   [phone] [phone]    Patient Care Team: Adelia Homestead, MD as PCP - General (Internal Medicine)   CHIEF COMPLAINT: Symptom management for pain and fatigue; multiple myeloma in remission  CURRENT THERAPY: Maintenance Revlimid    INTERVAL HISTORY Ms. Katherine Beard returns for follow up. Last seen by Diana Forster, NP 09/25/23 for routine visit. She messaged about increased hip/back pain and lethargy and was brought in.   ROS   Past Medical History:  Diagnosis Date   Acne    Back pain    Depression    DIVERTICULITIS, HX OF    Epicondylitis syndrome of elbow    RIGHT    Gallbladder problems    GERD (gastroesophageal reflux disease)    Hepatic cyst 2015   per MRI   History of stomach ulcers    HYPERGLYCEMIA    HYPERLIPIDEMIA    Hyperplastic colon polyp    Iliotibial band syndrome of right side    Joint pain    Liver cyst 2014   Melanoma (HCC) 2014   Excised -clear margins   Multiple myeloma (HCC)    OBESITY    Osteoarthritis    Palpitations    SOB (shortness of breath)    Vitamin D  deficiency      Past Surgical History:  Procedure Laterality Date   COLECTOMY  09-16-03   sigmoid   COLONOSCOPY  2014   COLONOSCOPY  09/19/2019   UMBILICAL LAPRPOSCOPY     AGE 63     Outpatient Encounter Medications as of 11/30/2023  Medication Sig Note   acetaminophen  (TYLENOL ) 500 MG tablet Take 500 mg by mouth every 4 (four) hours as needed for moderate pain. Takes 2 tabs Q4H    COMBIPATCH 0.05-0.25 MG/DAY Place 1 patch onto the skin.    desvenlafaxine  (PRISTIQ ) 25 MG 24 hr tablet TAKE 1 TABLET (25 MG TOTAL) BY MOUTH DAILY.    diazepam  (VALIUM ) 5 MG tablet Take 1 tablet (5 mg total) by mouth 2 (two) times daily as needed for anxiety or muscle spasms (Do not drive). Do not drive    diphenhydramine -acetaminophen  (TYLENOL  PM) 25-500 MG TABS tablet Take 2 tablets by mouth as needed.    hydrOXYzine  (ATARAX ) 10 MG tablet Take 1  tablet (10 mg total) by mouth 3 (three) times daily as needed.    Ibuprofen 200 MG CAPS Take 600 mg by mouth as needed. 09/21/2020: Takes 2-3 times/day   lenalidomide  (REVLIMID ) 10 MG capsule #1 capsule daily x 21 days on, 7 rest and repeat every 28 days. Start cycle on 11/24/23    melatonin 5 MG TABS Take 10 mg by mouth at bedtime.    methocarbamol  (ROBAXIN ) 500 MG tablet TAKE 1 TABLET BY MOUTH TWICE A DAY AS NEEDED MUSCLE SPASM    Multiple Vitamin (MULTIVITAMIN) capsule Take 1 capsule by mouth daily.    Nutritional Supplements (VITAL HIGH PROTEIN PO) Take 1 Scoop by mouth daily. Puts in coffee    oxyCODONE  (OXY IR/ROXICODONE ) 5 MG immediate release tablet Take 1-2 tablets (5-10 mg total) by mouth every 8 (eight) hours as needed.    rosuvastatin  (CRESTOR ) 20 MG tablet Take 1 tablet (20 mg total) by mouth daily.    tirzepatide  (ZEPBOUND ) 10 MG/0.5ML Pen Inject 10 mg into the skin once a week.    No facility-administered encounter medications on file as of 11/30/2023.     There were no vitals filed for this visit. There is no  height or weight on file to calculate BMI.   ECOG PERFORMANCE STATUS: {CHL ONC ECOG PS:[id number]}  PHYSICAL EXAM GENERAL:alert, no distress and comfortable SKIN: no rash  EYES: sclera clear NECK: without mass LYMPH:  no palpable cervical or supraclavicular lymphadenopathy  LUNGS: clear with normal breathing effort HEART: regular rate & rhythm, no lower extremity edema ABDOMEN: abdomen soft, non-tender and normal bowel sounds NEURO: alert & oriented x 3 with fluent speech, no focal motor/sensory deficits Breast exam:  PAC without erythema    CBC    Latest Ref Rng & Units 09/28/2023   11:16 AM 09/25/2023   11:07 AM 06/05/2023    8:44 AM  CBC  WBC 4.0 - 10.5 K/uL 3.9  2.6  2.8   Hemoglobin 12.0 - 15.0 g/dL 09.8  11.9  14.7   Hematocrit 36.0 - 46.0 % 41.4  40.6  42.0   Platelets 150.0 - 400.0 K/uL 204.0  198  239       CMP     Latest Ref Rng & Units  09/25/2023   11:07 AM 06/05/2023    8:44 AM 03/02/2023   11:02 AM  CMP  Glucose 70 - 99 mg/dL 92  86  87   BUN 6 - 20 mg/dL 19  18  21    Creatinine 0.44 - 1.00 mg/dL 8.29  5.62  1.30   Sodium 135 - 145 mmol/L 137  138  137   Potassium 3.5 - 5.1 mmol/L 4.0  3.9  3.9   Chloride 98 - 111 mmol/L 103  105  102   CO2 22 - 32 mmol/L 27  22  28    Calcium  8.9 - 10.3 mg/dL 9.0  8.8  8.7   Total Protein 6.5 - 8.1 g/dL 6.3  6.3  6.5   Total Bilirubin 0.0 - 1.2 mg/dL 0.4  0.5  0.4   Alkaline Phos 38 - 126 U/L 22  23  24    AST 15 - 41 U/L 16  15  15    ALT 0 - 44 U/L 14  12  16        ASSESSMENT & PLAN:  Multiple myeloma Bone marrow biopsy 09/04/2020-increased plasma cells (57% on the aspirate differential, 70-80% on the biopsy), lambda light chain restricted, 13q-, duplication of 1q, 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 Increase serum free lambda light chains Serum M spike MRI pelvis 09/01/2020-innumerable small foci of signal abnormality concerning for multiple myeloma involvement Metastatic bone survey 09/03/2020-no discrete lytic bone lesions, compression fractures at T12, L1, L2, and L3, osteopenia Cycle 1 RVD 09/14/2020 Daratumumab  beginning 09/28/2020 Cycle 2 RVD 10/05/2020 plus daratumumab  Week 3 daratumumab  10/12/2020 Normal lambda light chains 10/26/2020 Cycle 3 RVD plus daratumumab  10/26/2020 Cycle 4 RVD plus daratumumab  11/16/2020, daratumumab  changed to an every 2-week schedule beginning 11/23/2020 Cycle 5 RVD plus daratumumab  12/07/2020 Cycle 6 RVD plus daratumumab  12/28/2020 Normal lambda light chains 01/05/2021 Revlimid  maintenance 21/28 days beginning 01/18/2021 St[facility] cell harvest at [facility] 03/29/2021 Revlimid  resumed 03/30/2021 01/04/2023 normal light chains and negative serum immunofixation at [facility] 07/19/2023 normal serum light chains and negative serum immunofixation at [facility] Maintenance Revlimid  continued   2. Severe back pain MRI lumbar spine July 12, 2020-subacute compression fractures at T12 and L1 MRI lumbar  spine July 30, 2020-subacute T12 and L1 compression fractures, new subacute superior L2 endplate deformity MRI lumbar spine September 01, 2020 new/progressive compression fractures at L2 and L3, acute compression fracture at L4, evolution of T12 and L1 compression fractures MRI pelvis September 01, 2020-innumerable small foci  of signal abnormality in the pelvis and femurs, no fracture identified, concerning for multiple myeloma versus metastases Zometa  beginning 09/21/2020-plan monthly x3 then every 3 months, next due 11/19/2021 MRI thoracic spine July 2022-new T3 compression fracture with multiple additional thoracic compression fractures 3. Melanoma left upper back 2014 4. History of diverticulitis 5. Mild hypercalcemia at presentation-resolved 6. Osteoporosis 7. [place]us[place] 8.  Depression-worsened depression symptoms 01/11/2021, referred to psychology  PLAN:  No orders of the defined types were placed in this encounter.     All questions were answered. The patient knows to call the clinic with any problems, questions or concerns. No barriers to learning were detected. I spent *** counseling the patient face to face. The total time spent in the appointment was *** and more than 50% was on counseling, review of test results, and coordination of care.   Jacquis Paxton K Joni Colegrove, NP 11/29/2023 12:23 PM

## 2023-11-30 ENCOUNTER — Telehealth: Payer: Self-pay | Admitting: Nurse Practitioner

## 2023-11-30 ENCOUNTER — Inpatient Hospital Stay: Admitting: Nurse Practitioner

## 2023-11-30 ENCOUNTER — Inpatient Hospital Stay: Attending: Nurse Practitioner

## 2023-11-30 NOTE — Telephone Encounter (Signed)
 Patient has been scheduled for follow-up visit per 11/29/23 LOS.  Pt noted appt details on personal electronic device.

## 2023-12-04 ENCOUNTER — Inpatient Hospital Stay: Admitting: Nurse Practitioner

## 2023-12-04 ENCOUNTER — Encounter: Payer: Self-pay | Admitting: Nurse Practitioner

## 2023-12-04 ENCOUNTER — Inpatient Hospital Stay: Attending: Nurse Practitioner

## 2023-12-04 VITALS — BP 115/84 | HR 69 | Temp 98.1°F | Resp 18 | Ht 62.0 in | Wt 144.2 lb

## 2023-12-04 DIAGNOSIS — M8008XA Age-related osteoporosis with current pathological fracture, vertebra(e), initial encounter for fracture: Secondary | ICD-10-CM | POA: Diagnosis not present

## 2023-12-04 DIAGNOSIS — F32A Depression, unspecified: Secondary | ICD-10-CM | POA: Insufficient documentation

## 2023-12-04 DIAGNOSIS — C9000 Multiple myeloma not having achieved remission: Secondary | ICD-10-CM

## 2023-12-04 DIAGNOSIS — D709 Neutropenia, unspecified: Secondary | ICD-10-CM | POA: Diagnosis not present

## 2023-12-04 DIAGNOSIS — Z85820 Personal history of malignant melanoma of skin: Secondary | ICD-10-CM | POA: Insufficient documentation

## 2023-12-04 LAB — CMP (CANCER CENTER ONLY)
ALT: 17 U/L (ref 0–44)
AST: 17 U/L (ref 15–41)
Albumin: 4.2 g/dL (ref 3.5–5.0)
Alkaline Phosphatase: 28 U/L — ABNORMAL LOW (ref 38–126)
Anion gap: 10 (ref 5–15)
BUN: 15 mg/dL (ref 6–20)
CO2: 24 mmol/L (ref 22–32)
Calcium: 9.2 mg/dL (ref 8.9–10.3)
Chloride: 103 mmol/L (ref 98–111)
Creatinine: 0.75 mg/dL (ref 0.44–1.00)
GFR, Estimated: >60 mL/min (ref >60)
Glucose, Bld: 85 mg/dL (ref 70–99)
Potassium: 4.3 mmol/L (ref 3.5–5.1)
Sodium: 137 mmol/L (ref 135–145)
Total Bilirubin: 0.4 mg/dL (ref 0.0–1.2)
Total Protein: 6.3 g/dL — ABNORMAL LOW (ref 6.5–8.1)

## 2023-12-04 LAB — URINALYSIS, COMPLETE (UACMP) WITH MICROSCOPIC
Bilirubin Urine: NEGATIVE
Glucose, UA: NEGATIVE mg/dL
Hgb urine dipstick: NEGATIVE
Ketones, ur: NEGATIVE mg/dL
Leukocytes,Ua: NEGATIVE
Nitrite: NEGATIVE
Protein, ur: NEGATIVE mg/dL
Specific Gravity, Urine: <1.005 — ABNORMAL LOW (ref 1.005–1.030)
pH: 6.5 (ref 5.0–8.0)

## 2023-12-04 LAB — CBC WITH DIFFERENTIAL (CANCER CENTER ONLY)
Abs Immature Granulocytes: 0.01 10*3/uL (ref 0.00–0.07)
Basophils Absolute: 0 10*3/uL (ref 0.0–0.1)
Basophils Relative: 1 %
Eosinophils Absolute: 0.1 10*3/uL (ref 0.0–0.5)
Eosinophils Relative: 1 %
HCT: 40.2 % (ref 36.0–46.0)
Hemoglobin: 13.9 g/dL (ref 12.0–15.0)
Immature Granulocytes: 0 %
Lymphocytes Relative: 13 %
Lymphs Abs: 0.6 10*3/uL — ABNORMAL LOW (ref 0.7–4.0)
MCH: 32.7 pg (ref 26.0–34.0)
MCHC: 34.6 g/dL (ref 30.0–36.0)
MCV: 94.6 fL (ref 80.0–100.0)
Monocytes Absolute: 0.4 10*3/uL (ref 0.1–1.0)
Monocytes Relative: 8 %
Neutro Abs: 3.7 10*3/uL (ref 1.7–7.7)
Neutrophils Relative %: 77 %
Platelet Count: 212 10*3/uL (ref 150–400)
RBC: 4.25 MIL/uL (ref 3.87–5.11)
RDW: 13.4 % (ref 11.5–15.5)
WBC Count: 4.8 10*3/uL (ref 4.0–10.5)
nRBC: 0 % (ref 0.0–0.2)

## 2023-12-04 LAB — SAMPLE TO BLOOD BANK

## 2023-12-04 MED ORDER — OXYCODONE HCL 5 MG PO TABS: 5.0000 mg | ORAL_TABLET | Freq: Three times a day (TID) | ORAL | 0 refills | Status: DC | PRN

## 2023-12-04 NOTE — Progress Notes (Signed)
 Katherine Beard   Diagnosis: Multiple myeloma  INTERVAL HISTORY:   Katherine Beard returns prior to scheduled follow-up.  She contacted the office last week requesting an appointment for evaluation of increased pain and fatigue.  She continues Revlimid  maintenance.  Her daughter got married about a month ago.  Since then she has felt "off".  Energy has been poor.  She is taking naps.  No fevers or sweats.  Appetite is "okay".  For the past few months she has had more back pain.  She reports seeing Dr. Lamon Pillow and being told the compression fractures were worse.  No intervention recommended.  For the past several weeks she has noted discomfort along both "sides" of the abdomen.  No leg weakness or numbness.  She feels her overall strength is good.  No bowel or bladder dysfunction aside from mild stress incontinence when she sneezes.  She denies nausea/vomiting.  Typical bowel habits alternate constipation with diarrhea.  No rash.  Objective:  Vital signs in last 24 hours:  Blood pressure 115/84, pulse 69, temperature 98.1 F (36.7 C), resp. rate 18, height 5\' 2"  (1.575 m), weight 144 lb 3.2 oz (65.4 kg), last menstrual period 03/06/2011, SpO2 100%.    HEENT: No thrush or ulcers. Resp: Lungs clear bilaterally. Cardio: Regular rate and rhythm. GI: No hepatosplenomegaly. Vascular: No leg edema. Neuro: Upper and lower extremity strength 5/5. Skin: No rash.   Lab Results:  Lab Results  Component Value Date   WBC 4.8 12/04/2023   HGB 13.9 12/04/2023   HCT 40.2 12/04/2023   MCV 94.6 12/04/2023   PLT 212 12/04/2023   NEUTROABS 3.7 12/04/2023    Imaging:  No results found.  Medications: I have reviewed the patient's current medications.  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 Increase serum free lambda light chains Serum M spike MRI pelvis 09/01/2020-innumerable small foci of signal abnormality concerning for multiple myeloma involvement Metastatic bone survey 09/03/2020-no discrete lytic bone lesions, compression fractures at T12, L1, L2, and L3, osteopenia Cycle 1 RVD 09/14/2020 Daratumumab  beginning 09/28/2020 Cycle 2 RVD 10/05/2020 plus daratumumab  Week 3 daratumumab  10/12/2020 Normal lambda light chains 10/26/2020 Cycle 3 RVD plus daratumumab  10/26/2020 Cycle 4 RVD plus daratumumab  11/16/2020, daratumumab  changed to an every 2-week schedule beginning 11/23/2020 Cycle 5 RVD plus daratumumab  12/07/2020 Cycle 6 RVD plus daratumumab  12/28/2020 Normal lambda light chains 01/05/2021 Revlimid  maintenance 21/28 days beginning 01/18/2021 Stem cell harvest at [facility] 03/29/2021 Revlimid  resumed 03/30/2021 01/04/2023 normal light chains and negative serum immunofixation at [facility] 07/19/2023 normal serum light chains and negative serum immunofixation at [facility] Maintenance Revlimid  continued Normal serum light chains 09/25/2023   2. Severe back pain MRI lumbar spine July 12, 2020-subacute compression fractures at T12 and L1 MRI lumbar spine July 30, 2020-subacute T12 and L1 compression fractures, new subacute superior L2 endplate deformity MRI lumbar spine September 01, 2020 new/progressive compression fractures at L2 and L3, acute compression fracture at L4, evolution of T12 and L1 compression fractures MRI pelvis September 01, 2020-innumerable small foci of signal abnormality in the pelvis and femurs, no fracture identified, concerning for multiple myeloma versus metastases Zometa  beginning 09/21/2020-plan monthly x3 then every 3 months, next due 11/19/2021 MRI thoracic spine July 2022-new T3 compression fracture with multiple additional thoracic compression fractures 3. Melanoma left upper back 2014 4. History of diverticulitis 5. Mild hypercalcemia  at presentation-resolved 6.  Osteoporosis 7. Evusheld 8.  Depression-worsened depression symptoms 01/11/2021, referred to psychology    Disposition: [name] appears stable.  She has multiple myeloma, currently maintained on Revlimid .  Most recent serum light chains 09/25/2023 were in normal range.  She presents today with increased pain and fatigue/malaise.  We reviewed the CBC and chemistry panel from today.  Labs offer no explanation for current symptoms.    We will add a myeloma panel and serum light chains to the blood work.  She will submit a urine specimen for urinalysis.  We referred her for a bone survey.  I sent a refill for oxycodone  to her pharmacy.  Next scheduled office visit is 12/26/2023.  We will adjust to a sooner appointment pending outcome of the above labs and x-rays.   Katherine Beard ANP/GNP-BC   12/04/2023  11:14 AM

## 2023-12-05 LAB — KAPPA/LAMBDA LIGHT CHAINS
Kappa free light chain: 13.6 mg/L (ref 3.3–19.4)
Kappa, lambda light chain ratio: 1.16 (ref 0.26–1.65)
Lambda free light chains: 11.7 mg/L (ref 5.7–26.3)

## 2023-12-06 LAB — MULTIPLE MYELOMA PANEL, SERUM
Albumin SerPl Elph-Mcnc: 3.9 g/dL (ref 2.9–4.4)
Albumin/Glob SerPl: 1.7 (ref 0.7–1.7)
Alpha 1: 0.2 g/dL (ref 0.0–0.4)
Alpha2 Glob SerPl Elph-Mcnc: 0.6 g/dL (ref 0.4–1.0)
B-Globulin SerPl Elph-Mcnc: 0.8 g/dL (ref 0.7–1.3)
Gamma Glob SerPl Elph-Mcnc: 0.7 g/dL (ref 0.4–1.8)
Globulin, Total: 2.3 g/dL (ref 2.2–3.9)
IgA: 41 mg/dL — ABNORMAL LOW (ref 87–352)
IgG (Immunoglobin G), Serum: 778 mg/dL (ref 586–1602)
IgM (Immunoglobulin M), Srm: 19 mg/dL — ABNORMAL LOW (ref 26–217)
Total Protein ELP: 6.2 g/dL (ref 6.0–8.5)

## 2023-12-11 ENCOUNTER — Telehealth: Payer: Self-pay

## 2023-12-11 NOTE — Telephone Encounter (Signed)
 LVM with patient reminding her that she can go in to have her bone survey done at any time. The order has been placed and patient may choose to come in as a walk-in appointment or she can scheduled with the department.  Patient provided number for scheduling, if she would prefer that option. Patient also provided office callback number with any questions or concerns. Diana Forster, NP made aware.

## 2023-12-14 ENCOUNTER — Ambulatory Visit (HOSPITAL_BASED_OUTPATIENT_CLINIC_OR_DEPARTMENT_OTHER)
Admission: RE | Admit: 2023-12-14 | Discharge: 2023-12-14 | Disposition: A | Discharge status: Home / Self Care | Source: Ambulatory Visit | Attending: Nurse Practitioner | Admitting: Nurse Practitioner

## 2023-12-14 DIAGNOSIS — C9000 Multiple myeloma not having achieved remission: Secondary | ICD-10-CM | POA: Diagnosis present

## 2023-12-15 ENCOUNTER — Other Ambulatory Visit: Payer: Self-pay | Admitting: Oncology

## 2023-12-15 DIAGNOSIS — C9000 Multiple myeloma not having achieved remission: Secondary | ICD-10-CM

## 2023-12-18 ENCOUNTER — Encounter: Payer: Self-pay | Admitting: Oncology

## 2023-12-18 ENCOUNTER — Encounter: Payer: Self-pay | Admitting: Nurse Practitioner

## 2023-12-26 ENCOUNTER — Inpatient Hospital Stay

## 2023-12-26 ENCOUNTER — Inpatient Hospital Stay: Admitting: Oncology

## 2023-12-26 ENCOUNTER — Telehealth: Payer: Self-pay | Admitting: Nurse Practitioner

## 2023-12-26 VITALS — BP 127/81 | HR 74 | Temp 98.1°F | Resp 18 | Ht 62.0 in | Wt 144.2 lb

## 2023-12-26 DIAGNOSIS — C9000 Multiple myeloma not having achieved remission: Secondary | ICD-10-CM

## 2023-12-26 DIAGNOSIS — C9001 Multiple myeloma in remission: Secondary | ICD-10-CM

## 2023-12-26 LAB — CBC WITH DIFFERENTIAL (CANCER CENTER ONLY)
Abs Immature Granulocytes: 0 10*3/uL (ref 0.00–0.07)
Basophils Absolute: 0.1 10*3/uL (ref 0.0–0.1)
Basophils Relative: 2 %
Eosinophils Absolute: 0.1 10*3/uL (ref 0.0–0.5)
Eosinophils Relative: 2 %
HCT: 40.9 % (ref 36.0–46.0)
Hemoglobin: 14 g/dL (ref 12.0–15.0)
Immature Granulocytes: 0 %
Lymphocytes Relative: 33 %
Lymphs Abs: 0.8 10*3/uL (ref 0.7–4.0)
MCH: 32.1 pg (ref 26.0–34.0)
MCHC: 34.2 g/dL (ref 30.0–36.0)
MCV: 93.8 fL (ref 80.0–100.0)
Monocytes Absolute: 0.4 10*3/uL (ref 0.1–1.0)
Monocytes Relative: 16 %
Neutro Abs: 1.1 10*3/uL — ABNORMAL LOW (ref 1.7–7.7)
Neutrophils Relative %: 47 %
Platelet Count: 233 10*3/uL (ref 150–400)
RBC: 4.36 MIL/uL (ref 3.87–5.11)
RDW: 13.3 % (ref 11.5–15.5)
WBC Count: 2.4 10*3/uL — ABNORMAL LOW (ref 4.0–10.5)
nRBC: 0 % (ref 0.0–0.2)

## 2023-12-26 LAB — CMP (CANCER CENTER ONLY)
ALT: 17 U/L (ref 0–44)
AST: 18 U/L (ref 15–41)
Albumin: 4.3 g/dL (ref 3.5–5.0)
Alkaline Phosphatase: 27 U/L — ABNORMAL LOW (ref 38–126)
Anion gap: 11 (ref 5–15)
BUN: 17 mg/dL (ref 6–20)
CO2: 24 mmol/L (ref 22–32)
Calcium: 9.3 mg/dL (ref 8.9–10.3)
Chloride: 103 mmol/L (ref 98–111)
Creatinine: 0.79 mg/dL (ref 0.44–1.00)
GFR, Estimated: >60 mL/min (ref >60)
Glucose, Bld: 87 mg/dL (ref 70–99)
Potassium: 4 mmol/L (ref 3.5–5.1)
Sodium: 137 mmol/L (ref 135–145)
Total Bilirubin: 0.4 mg/dL (ref 0.0–1.2)
Total Protein: 6.6 g/dL (ref 6.5–8.1)

## 2023-12-26 MED ORDER — OXYCODONE HCL 5 MG PO TABS: 5.0000 mg | ORAL_TABLET | Freq: Three times a day (TID) | ORAL | 0 refills | Status: DC | PRN

## 2023-12-26 MED ORDER — DIAZEPAM 5 MG PO TABS: 5.0000 mg | ORAL_TABLET | Freq: Two times a day (BID) | ORAL | 0 refills | Status: DC | PRN

## 2023-12-26 MED ORDER — ZOLEDRONIC ACID 4 MG/100ML IV SOLN
4.0000 mg | Freq: Once | INTRAVENOUS | Status: AC
  Administered 2023-12-26: 4 mg via INTRAVENOUS
  Filled 2023-12-26: qty 100

## 2023-12-26 NOTE — Progress Notes (Signed)
 [facility] OFFICE PROGRESS NOTE   Diagnosis: Multiple myeloma  INTERVAL HISTORY:   Katherine Beard returns for a scheduled visit.  She began another cycle of Revlimid  maintenance on 12/22/2023.  No nausea or diarrhea.  She has transient tingling in the extremities. She reports increased back pain.  She has been evaluated by Dr. Onetha and reports one of the compression fractures has worsened.  She takes methocarbamol , Tylenol , ibuprofen, and oxycodone  as needed for pain.  She is participating in an exercise program, massage therapy, and has used a topical gel. She otherwise feels well.  She reports intentional weight loss with Zepbound .  She is weaning off of the Zepbound . No tooth pain or loosening.  She is due for Zometa  today. Objective:  Vital signs in last 24 hours:  Blood pressure 127/81, pulse 74, temperature 98.1 F (36.7 C), temperature source Temporal, resp. rate 18, height 5' 2 (1.575 m), weight 144 lb 3.2 oz (65.4 kg), last menstrual period 03/06/2011, SpO2 100%.    HEENT: No thrush Resp: Lungs clear bilaterally Cardio: Regular rate and rhythm GI: No hepatosplenomegaly, nontender Vascular: No leg edema Musculoskeletal: Low back kyphosis   Lab Results:  Lab Results  Component Value Date   WBC 2.4 (L) 12/26/2023   HGB 14.0 12/26/2023   HCT 40.9 12/26/2023   MCV 93.8 12/26/2023   PLT 233 12/26/2023   NEUTROABS 1.1 (L) 12/26/2023    CMP  Lab Results  Component Value Date   NA 137 12/26/2023   K 4.0 12/26/2023   CL 103 12/26/2023   CO2 24 12/26/2023   GLUCOSE 87 12/26/2023   BUN 17 12/26/2023   CREATININE 0.79 12/26/2023   CALCIUM  9.3 12/26/2023   PROT 6.6 12/26/2023   ALBUMIN 4.3 12/26/2023   AST 18 12/26/2023   ALT 17 12/26/2023   ALKPHOS 27 (L) 12/26/2023   BILITOT 0.4 12/26/2023   GFRNONAA >60 12/26/2023   GFRAA 78 12/11/2006   the patient's current medications.   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 Increase serum free lambda light chains Serum M spike MRI pelvis 09/01/2020-innumerable small foci of signal abnormality concerning for multiple myeloma involvement Metastatic bone survey 09/03/2020-no discrete lytic bone lesions, compression fractures at T12, L1, L2, and L3, osteopenia Cycle 1 RVD 09/14/2020 Daratumumab  beginning 09/28/2020 Cycle 2 RVD 10/05/2020 plus daratumumab  Week 3 daratumumab  10/12/2020 Normal lambda light chains 10/26/2020 Cycle 3 RVD plus daratumumab  10/26/2020 Cycle 4 RVD plus daratumumab  11/16/2020, daratumumab  changed to an every 2-week schedule beginning 11/23/2020 Cycle 5 RVD plus daratumumab  12/07/2020 Cycle 6 RVD plus daratumumab  12/28/2020 Normal lambda light chains 01/05/2021 Revlimid  maintenance 21/28 days beginning 01/18/2021 Stem cell harvest at [facility] 03/29/2021 Revlimid  resumed 03/30/2021 01/04/2023 normal light chains and negative serum immunofixation at [facility] 07/19/2023 normal serum light chains and negative serum immunofixation at [facility] Maintenance Revlimid  continued Normal serum light chains 09/25/2023 Normal serum light chains 12/04/2023 12/14/2023 bone survey: Stable compression fractures T12-L4, no acute change   2. Severe back pain MRI lumbar spine July 12, 2020-subacute compression fractures at T12 and L1 MRI lumbar spine July 30, 2020-subacute T12 and L1 compression fractures, new subacute superior L2 endplate deformity MRI lumbar spine September 01, 2020 new/progressive compression fractures at L2 and L3, acute compression fracture at L4, evolution of T12 and L1 compression fractures MRI pelvis September 01, 2020-innumerable small foci of signal abnormality  in the pelvis and femurs, no fracture identified, concerning for multiple myeloma versus metastases Zometa  beginning 09/21/2020-plan monthly x3 then every 3  months, next due 11/19/2021 MRI thoracic spine July 2022-new T3 compression fracture with multiple additional thoracic compression fractures 3. Melanoma left upper back 2014 4. History of diverticulitis 5. Mild hypercalcemia at presentation-resolved 6. Osteoporosis 7. Evusheld 8.  Depression-worsened depression symptoms 01/11/2021, referred to psychology     Disposition: Katherine Beard is in clinical remission from multiple myeloma.  She continues Revlimid  maintenance.  She has mild neutropenia.  She will call for a fever or symptoms of an infection.  She has pain secondary to chronic compression fractures.  She continues analgesics as needed.  She we will continue exercise, massage therapy, and other symptomatic interventions.  She is followed by neurosurgery.  Katherine Beard is scheduled to see Dr. Volanda in August.  She will return for an office visit and Zometa  in 3 months.  She does not wish to return for a CBC in the interim. I refilled prescriptions for Valium  and oxycodone  today.  Arley Hof, MD  12/26/2023  9:31 AM

## 2023-12-26 NOTE — Patient Instructions (Signed)

## 2023-12-26 NOTE — Progress Notes (Signed)
 Patient seen by Dr. Arley Hof today  Vitals are within treatment parameters:Yes   Labs are within treatment parameters: Yes   Treatment plan has been signed: Yes   Per physician team, Patient is ready for treatment and there are NO modifications to the treatment plan. OK to proceed w/Zometa  today

## 2023-12-26 NOTE — Telephone Encounter (Signed)
 Patient has been scheduled for follow-up visit per 12/26/23 LOS.  LVM notifying pt of appt details, provided my direct number to pt if appt changes need to be made.

## 2023-12-27 ENCOUNTER — Encounter: Payer: Self-pay | Admitting: Oncology

## 2023-12-27 ENCOUNTER — Other Ambulatory Visit: Payer: Self-pay | Admitting: Oncology

## 2023-12-27 DIAGNOSIS — C9000 Multiple myeloma not having achieved remission: Secondary | ICD-10-CM

## 2023-12-27 LAB — KAPPA/LAMBDA LIGHT CHAINS
Kappa free light chain: 13.1 mg/L (ref 3.3–19.4)
Kappa, lambda light chain ratio: 1.28 (ref 0.26–1.65)
Lambda free light chains: 10.2 mg/L (ref 5.7–26.3)

## 2023-12-29 ENCOUNTER — Other Ambulatory Visit: Payer: Self-pay | Admitting: Nurse Practitioner

## 2023-12-29 DIAGNOSIS — C9000 Multiple myeloma not having achieved remission: Secondary | ICD-10-CM

## 2024-01-02 ENCOUNTER — Encounter: Payer: Self-pay | Admitting: Oncology

## 2024-01-02 ENCOUNTER — Encounter: Payer: Self-pay | Admitting: *Deleted

## 2024-01-11 ENCOUNTER — Other Ambulatory Visit: Payer: Self-pay | Admitting: Oncology

## 2024-01-11 DIAGNOSIS — C9000 Multiple myeloma not having achieved remission: Secondary | ICD-10-CM

## 2024-01-19 ENCOUNTER — Telehealth: Payer: Self-pay | Admitting: *Deleted

## 2024-01-19 ENCOUNTER — Encounter: Payer: Self-pay | Admitting: Oncology

## 2024-01-19 ENCOUNTER — Other Ambulatory Visit: Payer: Self-pay | Admitting: Oncology

## 2024-01-19 DIAGNOSIS — C9000 Multiple myeloma not having achieved remission: Secondary | ICD-10-CM

## 2024-01-19 MED ORDER — OXYCODONE HCL 5 MG PO TABS
5.0000 mg | ORAL_TABLET | Freq: Three times a day (TID) | ORAL | 0 refills | Status: DC | PRN
Start: 2024-01-19 — End: 2024-02-13

## 2024-01-19 NOTE — Telephone Encounter (Signed)
 Sent Mychart message for refill on oxycodone  today since she is leaving town Monday am and won't be home till the following weekend. Message to MD to refill sent via staff message.

## 2024-01-22 ENCOUNTER — Ambulatory Visit (INDEPENDENT_AMBULATORY_CARE_PROVIDER_SITE_OTHER): Admitting: Internal Medicine

## 2024-01-29 ENCOUNTER — Ambulatory Visit (INDEPENDENT_AMBULATORY_CARE_PROVIDER_SITE_OTHER): Admitting: Internal Medicine

## 2024-02-07 ENCOUNTER — Other Ambulatory Visit: Payer: Self-pay | Admitting: Oncology

## 2024-02-07 DIAGNOSIS — C9000 Multiple myeloma not having achieved remission: Secondary | ICD-10-CM

## 2024-02-08 ENCOUNTER — Other Ambulatory Visit: Payer: Self-pay | Admitting: Internal Medicine

## 2024-02-13 ENCOUNTER — Other Ambulatory Visit: Payer: Self-pay | Admitting: *Deleted

## 2024-02-13 ENCOUNTER — Other Ambulatory Visit: Payer: Self-pay | Admitting: Nurse Practitioner

## 2024-02-13 ENCOUNTER — Encounter: Payer: Self-pay | Admitting: Oncology

## 2024-02-13 DIAGNOSIS — C9000 Multiple myeloma not having achieved remission: Secondary | ICD-10-CM

## 2024-02-13 MED ORDER — OXYCODONE HCL 5 MG PO TABS: 5.0000 mg | ORAL_TABLET | Freq: Three times a day (TID) | ORAL | 0 refills | Status: DC | PRN

## 2024-02-13 NOTE — Telephone Encounter (Signed)
 Katherine Beard left [employer] requesting refill on her oxycodone . Forwarded to NP.

## 2024-02-17 ENCOUNTER — Other Ambulatory Visit: Payer: Self-pay | Admitting: Oncology

## 2024-02-17 DIAGNOSIS — C9000 Multiple myeloma not having achieved remission: Secondary | ICD-10-CM

## 2024-02-20 ENCOUNTER — Encounter: Payer: Self-pay | Admitting: Oncology

## 2024-02-22 ENCOUNTER — Encounter (INDEPENDENT_AMBULATORY_CARE_PROVIDER_SITE_OTHER): Payer: Self-pay | Admitting: Internal Medicine

## 2024-02-22 ENCOUNTER — Other Ambulatory Visit (INDEPENDENT_AMBULATORY_CARE_PROVIDER_SITE_OTHER): Payer: Self-pay

## 2024-02-22 ENCOUNTER — Other Ambulatory Visit (INDEPENDENT_AMBULATORY_CARE_PROVIDER_SITE_OTHER): Payer: Self-pay | Admitting: Internal Medicine

## 2024-02-22 ENCOUNTER — Encounter (INDEPENDENT_AMBULATORY_CARE_PROVIDER_SITE_OTHER): Payer: Self-pay

## 2024-02-22 MED ORDER — ZEPBOUND 10 MG/0.5ML ~~LOC~~ SOLN: 10.0000 mg | SUBCUTANEOUS | 0 refills | Status: DC

## 2024-02-22 MED ORDER — TIRZEPATIDE-WEIGHT MANAGEMENT 5 MG/0.5ML ~~LOC~~ SOLN: 5.0000 mg | SUBCUTANEOUS | 0 refills | Status: DC

## 2024-02-26 ENCOUNTER — Other Ambulatory Visit (INDEPENDENT_AMBULATORY_CARE_PROVIDER_SITE_OTHER): Payer: Self-pay | Admitting: Internal Medicine

## 2024-02-26 DIAGNOSIS — E669 Obesity, unspecified: Secondary | ICD-10-CM

## 2024-02-26 DIAGNOSIS — R638 Other symptoms and signs concerning food and fluid intake: Secondary | ICD-10-CM

## 2024-02-26 MED ORDER — TIRZEPATIDE-WEIGHT MANAGEMENT 5 MG/0.5ML ~~LOC~~ SOAJ: 5.0000 mg | SUBCUTANEOUS | 0 refills | Status: DC

## 2024-02-26 NOTE — Telephone Encounter (Signed)
 Patient would prefer to be given 7.5 mg.

## 2024-02-26 NOTE — Progress Notes (Signed)
 Spoke with patient, due to treatment interruption of 4 weeks. Medication will be restarted at zepbound  5mg  once a week.

## 2024-03-05 ENCOUNTER — Other Ambulatory Visit: Payer: Self-pay | Admitting: Oncology

## 2024-03-05 DIAGNOSIS — C9000 Multiple myeloma not having achieved remission: Secondary | ICD-10-CM

## 2024-03-07 ENCOUNTER — Encounter: Payer: Self-pay | Admitting: Oncology

## 2024-03-07 ENCOUNTER — Other Ambulatory Visit: Payer: Self-pay | Admitting: Nurse Practitioner

## 2024-03-07 DIAGNOSIS — C9000 Multiple myeloma not having achieved remission: Secondary | ICD-10-CM

## 2024-03-07 MED ORDER — OXYCODONE HCL 5 MG PO TABS: 5.0000 mg | ORAL_TABLET | Freq: Three times a day (TID) | ORAL | 0 refills | Status: DC | PRN

## 2024-03-12 ENCOUNTER — Other Ambulatory Visit: Payer: Self-pay | Admitting: Oncology

## 2024-03-12 ENCOUNTER — Encounter: Payer: Self-pay | Admitting: Oncology

## 2024-03-12 DIAGNOSIS — C9000 Multiple myeloma not having achieved remission: Secondary | ICD-10-CM

## 2024-03-13 NOTE — Telephone Encounter (Signed)
 Gave to NP

## 2024-03-14 ENCOUNTER — Other Ambulatory Visit: Payer: Self-pay | Admitting: Nurse Practitioner

## 2024-03-14 DIAGNOSIS — C9000 Multiple myeloma not having achieved remission: Secondary | ICD-10-CM

## 2024-03-14 MED ORDER — DIAZEPAM 5 MG PO TABS: 5.0000 mg | ORAL_TABLET | Freq: Two times a day (BID) | ORAL | 0 refills | Status: DC | PRN

## 2024-03-18 ENCOUNTER — Encounter (INDEPENDENT_AMBULATORY_CARE_PROVIDER_SITE_OTHER): Payer: Self-pay | Admitting: Internal Medicine

## 2024-03-18 ENCOUNTER — Ambulatory Visit (INDEPENDENT_AMBULATORY_CARE_PROVIDER_SITE_OTHER): Payer: Self-pay | Admitting: Internal Medicine

## 2024-03-18 ENCOUNTER — Other Ambulatory Visit (INDEPENDENT_AMBULATORY_CARE_PROVIDER_SITE_OTHER): Payer: Self-pay | Admitting: Internal Medicine

## 2024-03-18 VITALS — BP 114/69 | HR 82 | Temp 98.5°F | Ht 62.0 in | Wt 144.0 lb

## 2024-03-18 DIAGNOSIS — R638 Other symptoms and signs concerning food and fluid intake: Secondary | ICD-10-CM

## 2024-03-18 DIAGNOSIS — E669 Obesity, unspecified: Secondary | ICD-10-CM | POA: Diagnosis not present

## 2024-03-18 DIAGNOSIS — Z6826 Body mass index (BMI) 26.0-26.9, adult: Secondary | ICD-10-CM

## 2024-03-18 DIAGNOSIS — M810 Age-related osteoporosis without current pathological fracture: Secondary | ICD-10-CM

## 2024-03-18 MED ORDER — TIRZEPATIDE-WEIGHT MANAGEMENT 10 MG/0.5ML ~~LOC~~ SOAJ: 10.0000 mg | SUBCUTANEOUS | 2 refills | Status: DC

## 2024-03-18 NOTE — Assessment & Plan Note (Signed)
 Weight: decrease of 34 lb (19.1%) over 1 year, 8 months  Start: 06/28/2022 178 lb (80.7 kg)  End: 03/18/2024 144 lb (65.3 kg)  Obesity management with a focus on weight reduction and body composition improvement. Recent weight gain of 6 pounds attributed to a lapse in routine due to travel and personal events.  She also had come off medication and were back titrating medication up.  Current body fat percentage is 31%, within a healthy range for her age and height. Muscle mass has increased, which is common when caloric intake is increased. Goal weight is 135 pounds, with a focus on maintaining lean muscle mass and bone density. Discussed the importance of gradual medication titration to avoid abrupt cessation. Emphasized the need to maintain a balanced diet with adequate protein and fiber intake to support weight management and satiety. - Increase GLP-1 receptor agonist dose to 10 mg with two refills - Encourage continuation of current exercise regimen with focus on light weights and strengthening - Advise on maintaining a balanced diet with adequate protein and fiber intake - Encourage use of online meal planners for updated meal plans

## 2024-03-18 NOTE — Progress Notes (Signed)
 Office: [phone]  /  Fax: [phone]  Weight Summary and Body Composition Analysis (BIA)  Vitals Temp: 98.5 F (36.9 C) BP: 114/69 Pulse Rate: 82 SpO2: (!) 82 %   Anthropometric Measurements Height: 5' 2 (1.575 m) Weight: 144 lb (65.3 kg) BMI (Calculated): 26.33 Weight at Last Visit: 138 lb Weight Lost Since Last Visit: 0 lb Weight Gained Since Last Visit: 6 lb Starting Weight: 172 lb Total Weight Loss (lbs): 28 lb (12.7 kg) Peak Weight: 180 lb   Body Composition  Body Fat %: 31.3 % Fat Mass (lbs): 45 lbs Muscle Mass (lbs): 94 lbs Total Body Water (lbs): 64.2 lbs Visceral Fat Rating : 7    RMR: 1339  Today's Visit #: 17  Starting Date: 04/13/23   Subjective   Chief Complaint: Obesity  Interval History Discussed the use of AI scribe software for clinical note transcription with the patient, who gave verbal consent to proceed.  History of Present Illness Katherine Beard is a 63 year old female who presents for medical weight management.  She has resumed her weight management program, including a 5 mg dose of her medication, without experiencing nausea or constipation. She recently gained six pounds due to a lapse in her routine from travel and a family wedding but has been back on her plan for the last two weeks and is losing weight again. She uses a pen for medication administration, preferring this method for its convenience despite the cost. She is consistent with her program and tracks her activity, including walking and light weight training.  She experiences back pain attributed to lifting a suitcase while traveling, which limits her ability to perform certain exercises, particularly those involving the upper body. She has resumed light weight training and walking but remains cautious due to her back pain. No new fractures are reported, but she notes a loss of a quarter inch in height due to compression. She reports that a full bone scan was performed  and her oncologist told her that all her numbers were great and there were no lytic lesions.  She takes oxycodone , which contributes to constipation, but manages this with dietary adjustments such as consuming kiwi. She has stopped consuming alcohol and uses THC and CBD-infused seltzers for relaxation and back pain, noting it helps her avoid taking pain medication in the evenings.     Challenges affecting patient progress: orthopedic problems, medical conditions or chronic pain affecting mobility and medical comorbidities.    Pharmacotherapy for weight management: She is currently taking Zepbound  with adequate clinical response  and without side effects..   Assessment and Plan   Treatment Plan For Obesity:  Recommended Dietary Goals  Kriston is currently in the action stage of change. As such, her goal is to continue weight management plan. She has agreed to: continue current plan  Behavioral Health and Counseling  We discussed the following behavioral modification strategies today: continue to work on maintaining a reduced calorie state, getting the recommended amount of protein, incorporating whole foods, making healthy choices, staying well hydrated and practicing mindfulness when eating. and increase protein intake, fibrous foods (25 grams per day for women, 30 grams for men) and water to improve satiety and decrease hunger signals. .  Additional education and resources provided today: None  Recommended Physical Activity Goals  Tovah has been advised to work up to 150 minutes of moderate intensity aerobic activity a week and strengthening exercises 2-3 times per week for cardiovascular health, weight loss maintenance and preservation  of muscle mass.  She has agreed to :  Increase volume of physical activity to a goal of 240 minutes a week and Combine aerobic and strengthening exercises for efficiency and improved cardiometabolic health.  Medical Interventions and  Pharmacotherapy  We discussed various medication options to help Marieelena with her weight loss efforts and we both agreed to : Increase Zepbound  to 10 mg once a week  Associated Conditions Impacted by Obesity Treatment  Assessment & Plan Generalized obesity with starting BMI 31 BMI 26.0-26.9,adult Weight: decrease of 34 lb (19.1%) over 1 year, 8 months  Start: 06/28/2022 178 lb (80.7 kg)  End: 03/18/2024 144 lb (65.3 kg)  Obesity management with a focus on weight reduction and body composition improvement. Recent weight gain of 6 pounds attributed to a lapse in routine due to travel and personal events.  She also had come off medication and were back titrating medication up.  Current body fat percentage is 31%, within a healthy range for her age and height. Muscle mass has increased, which is common when caloric intake is increased. Goal weight is 135 pounds, with a focus on maintaining lean muscle mass and bone density. Discussed the importance of gradual medication titration to avoid abrupt cessation. Emphasized the need to maintain a balanced diet with adequate protein and fiber intake to support weight management and satiety. - Increase GLP-1 receptor agonist dose to 10 mg with two refills - Encourage continuation of current exercise regimen with focus on light weights and strengthening - Advise on maintaining a balanced diet with adequate protein and fiber intake - Encourage use of online meal planners for updated meal plans Abnormal food appetite Improved on Zepbound .  She had increased orexigenic signaling and cravings.  This is secondary to an abnormal energy regulation system and pathological neurohormonal pathways characteristic of excess adiposity.  In addition to nutritional and behavioral strategies she benefits from ongoing pharmacotherapy.  Increase Zepbound  to 10 mg once a week  Osteoporosis, unspecified osteoporosis type, unspecified pathological fracture presence We discussed  weight loss goals she would like to lose additional weight we reviewed the benefits of gradual weight loss versus rapid as well as reaching a fat mass of less than 60 pounds which may slow down weight loss rate.  Further weight loss may be also associated with loss of bone density and muscle.  We will monitor for the latter via bioimpedance.  Continue to perform weightbearing exercises as tolerated.          Objective   Physical Exam:  Blood pressure 114/69, pulse 82, temperature 98.5 F (36.9 C), height 5' 2 (1.575 m), weight 144 lb (65.3 kg), last menstrual period 03/06/2011, SpO2 (!) 82%. Body mass index is 26.34 kg/m.  General: She is overweight, cooperative, alert, well developed, and in no acute distress. PSYCH: Has normal mood, affect and thought process.   HEENT: EOMI, sclerae are anicteric. Lungs: Normal breathing effort, no conversational dyspnea. Extremities: No edema.  Neurologic: No gross sensory or motor deficits. No tremors or fasciculations noted.    Diagnostic Data Reviewed:  BMET    Component Value Date/Time   NA 137 12/26/2023 0835   K 4.0 12/26/2023 0835   CL 103 12/26/2023 0835   CO2 24 12/26/2023 0835   GLUCOSE 87 12/26/2023 0835   BUN 17 12/26/2023 0835   CREATININE 0.79 12/26/2023 0835   CALCIUM  9.3 12/26/2023 0835   GFRNONAA >60 12/26/2023 0835   GFRAA 78 12/11/2006 1036   Lab Results  Component Value  Date   HGBA1C 5.4 07/13/2022   HGBA1C 5.6 12/11/2006   Lab Results  Component Value Date   INSULIN  4.4 07/13/2022   Lab Results  Component Value Date   TSH 1.360 07/13/2022   CBC    Component Value Date/Time   WBC 2.4 (L) 12/26/2023 0835   WBC 3.9 (L) 09/28/2023 1116   RBC 4.36 12/26/2023 0835   HGB 14.0 12/26/2023 0835   HCT 40.9 12/26/2023 0835   PLT 233 12/26/2023 0835   MCV 93.8 12/26/2023 0835   MCH 32.1 12/26/2023 0835   MCHC 34.2 12/26/2023 0835   RDW 13.3 12/26/2023 0835   Iron Studies    Component Value Date/Time    IRON 65 07/08/2020 1509   FERRITIN 109.9 09/28/2023 1116   IRONPCTSAT 17.4 (L) 07/08/2020 1509   Lipid Panel     Component Value Date/Time   CHOL 134 09/28/2023 1116   CHOL 224 (H) 01/16/2023 1115   TRIG 60.0 09/28/2023 1116   HDL 59.40 09/28/2023 1116   HDL 65 01/16/2023 1115   CHOLHDL 2 09/28/2023 1116   VLDL 12.0 09/28/2023 1116   LDLCALC 62 09/28/2023 1116   LDLCALC 142 (H) 01/16/2023 1115   LDLDIRECT 173.7 11/26/2010 0756   Hepatic Function Panel     Component Value Date/Time   PROT 6.6 12/26/2023 0835   ALBUMIN 4.3 12/26/2023 0835   AST 18 12/26/2023 0835   ALT 17 12/26/2023 0835   ALKPHOS 27 (L) 12/26/2023 0835   BILITOT 0.4 12/26/2023 0835   BILIDIR 0.1 11/26/2010 0756   IBILI 0.4 11/30/2007 2242      Component Value Date/Time   TSH 1.360 07/13/2022 0931   Nutritional Lab Results  Component Value Date   VD25OH 29.90 (L) 09/28/2023   VD25OH 77.3 01/16/2023   VD25OH 29.6 (L) 07/13/2022    Medications: Outpatient Encounter Medications as of 03/18/2024  Medication Sig Note   acetaminophen  (TYLENOL ) 500 MG tablet Take 500 mg by mouth every 4 (four) hours as needed for moderate pain. Takes 2 tabs Q4H    COMBIPATCH 0.05-0.25 MG/DAY Place 1 patch onto the skin.    desvenlafaxine  (PRISTIQ ) 25 MG 24 hr tablet TAKE 1 TABLET (25 MG TOTAL) BY MOUTH DAILY.    diazepam  (VALIUM ) 5 MG tablet Take 1 tablet (5 mg total) by mouth 2 (two) times daily as needed for anxiety or muscle spasms (Do not drive). Do not drive    diphenhydramine -acetaminophen  (TYLENOL  PM) 25-500 MG TABS tablet Take 2 tablets by mouth as needed.    Ibuprofen 200 MG CAPS Take 600 mg by mouth as needed. 09/21/2020: Takes 2-3 times/day   lenalidomide  (REVLIMID ) 10 MG capsule TAKE 1 CAPSULE BY MOUTH DAILY  FOR 21 DAYS, THEN 7 DAYS OFF  (START CYCLE ON 03/15/2024)Repeat every 28 days    melatonin 5 MG TABS Take 10 mg by mouth at bedtime.    methocarbamol  (ROBAXIN ) 500 MG tablet TAKE 1 TABLET BY MOUTH TWICE  DAILY AS NEEDED    Multiple Vitamin (MULTIVITAMIN) capsule Take 1 capsule by mouth daily.    Nutritional Supplements (VITAL HIGH PROTEIN PO) Take 1 Scoop by mouth daily. Puts in coffee    oxyCODONE  (OXY IR/ROXICODONE ) 5 MG immediate release tablet Take 1-2 tablets (5-10 mg total) by mouth every 8 (eight) hours as needed.    rosuvastatin  (CRESTOR ) 20 MG tablet TAKE 1 TABLET BY MOUTH EVERY DAY    tirzepatide  (ZEPBOUND ) 10 MG/0.5ML Pen Inject 10 mg into the skin once a week.    [  DISCONTINUED] tirzepatide  (ZEPBOUND ) 5 MG/0.5ML Pen Inject 5 mg into the skin once a week.    hydrOXYzine  (ATARAX ) 10 MG tablet Take 1 tablet (10 mg total) by mouth 3 (three) times daily as needed. (Patient not taking: Reported on 12/26/2023)    No facility-administered encounter medications on file as of 03/18/2024.     Follow-Up   Return in about 8 weeks (around 05/13/2024) for For Weight Mangement with Dr. Francyne.SABRA She was informed of the importance of frequent follow up visits to maximize her success with intensive lifestyle modifications for her multiple health conditions.  Attestation Statement   Reviewed by clinician on day of visit: allergies, medications, problem list, medical history, surgical history, family history, social history, and previous encounter notes.     Lucas Francyne, MD

## 2024-03-18 NOTE — Assessment & Plan Note (Signed)
 We discussed weight loss goals she would like to lose additional weight we reviewed the benefits of gradual weight loss versus rapid as well as reaching a fat mass of less than 60 pounds which may slow down weight loss rate.  Further weight loss may be also associated with loss of bone density and muscle.  We will monitor for the latter via bioimpedance.  Continue to perform weightbearing exercises as tolerated.

## 2024-03-18 NOTE — Assessment & Plan Note (Addendum)
 Weight: decrease of 34 lb (19.1%) over 1 year, 8 months  Start: 06/28/2022 178 lb (80.7 kg)  End: 03/18/2024 144 lb (65.3 kg)  Obesity management with a focus on weight reduction and body composition improvement. Recent weight gain of 6 pounds attributed to a lapse in routine due to travel and personal events.  She also had come off medication and were back titrating medication up.  Current body fat percentage is 31%, within a healthy range for her age and height. Muscle mass has increased, which is common when caloric intake is increased. Goal weight is 135 pounds, with a focus on maintaining lean muscle mass and bone density. Discussed the importance of gradual medication titration to avoid abrupt cessation. Emphasized the need to maintain a balanced diet with adequate protein and fiber intake to support weight management and satiety. - Increase GLP-1 receptor agonist dose to 10 mg with two refills - Encourage continuation of current exercise regimen with focus on light weights and strengthening - Advise on maintaining a balanced diet with adequate protein and fiber intake - Encourage use of online meal planners for updated meal plans

## 2024-03-18 NOTE — Assessment & Plan Note (Signed)
 Improved on Zepbound .  She had increased orexigenic signaling and cravings.  This is secondary to an abnormal energy regulation system and pathological neurohormonal pathways characteristic of excess adiposity.  In addition to nutritional and behavioral strategies she benefits from ongoing pharmacotherapy.  Increase Zepbound  to 10 mg once a week

## 2024-03-27 ENCOUNTER — Inpatient Hospital Stay (HOSPITAL_BASED_OUTPATIENT_CLINIC_OR_DEPARTMENT_OTHER): Admitting: Nurse Practitioner

## 2024-03-27 ENCOUNTER — Inpatient Hospital Stay: Attending: Nurse Practitioner

## 2024-03-27 ENCOUNTER — Inpatient Hospital Stay

## 2024-03-27 ENCOUNTER — Encounter: Payer: Self-pay | Admitting: Nurse Practitioner

## 2024-03-27 VITALS — BP 111/69 | HR 50 | Temp 98.1°F | Resp 18

## 2024-03-27 VITALS — BP 110/79 | HR 60 | Temp 98.1°F | Resp 18 | Ht 62.0 in | Wt 147.3 lb

## 2024-03-27 DIAGNOSIS — F32A Depression, unspecified: Secondary | ICD-10-CM | POA: Diagnosis not present

## 2024-03-27 DIAGNOSIS — C9000 Multiple myeloma not having achieved remission: Secondary | ICD-10-CM

## 2024-03-27 DIAGNOSIS — M8008XA Age-related osteoporosis with current pathological fracture, vertebra(e), initial encounter for fracture: Secondary | ICD-10-CM | POA: Insufficient documentation

## 2024-03-27 DIAGNOSIS — Z85820 Personal history of malignant melanoma of skin: Secondary | ICD-10-CM | POA: Insufficient documentation

## 2024-03-27 DIAGNOSIS — C9001 Multiple myeloma in remission: Secondary | ICD-10-CM | POA: Diagnosis present

## 2024-03-27 LAB — CBC WITH DIFFERENTIAL (CANCER CENTER ONLY)
Abs Immature Granulocytes: 0 K/uL (ref 0.00–0.07)
Basophils Absolute: 0 K/uL (ref 0.0–0.1)
Basophils Relative: 2 %
Eosinophils Absolute: 0.1 K/uL (ref 0.0–0.5)
Eosinophils Relative: 2 %
HCT: 42.9 % (ref 36.0–46.0)
Hemoglobin: 14.7 g/dL (ref 12.0–15.0)
Immature Granulocytes: 0 %
Lymphocytes Relative: 27 %
Lymphs Abs: 0.7 K/uL (ref 0.7–4.0)
MCH: 32.4 pg (ref 26.0–34.0)
MCHC: 34.3 g/dL (ref 30.0–36.0)
MCV: 94.5 fL (ref 80.0–100.0)
Monocytes Absolute: 0.3 K/uL (ref 0.1–1.0)
Monocytes Relative: 13 %
Neutro Abs: 1.4 K/uL — ABNORMAL LOW (ref 1.7–7.7)
Neutrophils Relative %: 56 %
Platelet Count: 215 K/uL (ref 150–400)
RBC: 4.54 MIL/uL (ref 3.87–5.11)
RDW: 12.5 % (ref 11.5–15.5)
WBC Count: 2.5 K/uL — ABNORMAL LOW (ref 4.0–10.5)
nRBC: 0 % (ref 0.0–0.2)

## 2024-03-27 LAB — CMP (CANCER CENTER ONLY)
ALT: 19 U/L (ref 0–44)
AST: 18 U/L (ref 15–41)
Albumin: 4.6 g/dL (ref 3.5–5.0)
Alkaline Phosphatase: 30 U/L — ABNORMAL LOW (ref 38–126)
Anion gap: 12 (ref 5–15)
BUN: 18 mg/dL (ref 6–20)
CO2: 24 mmol/L (ref 22–32)
Calcium: 9.8 mg/dL (ref 8.9–10.3)
Chloride: 101 mmol/L (ref 98–111)
Creatinine: 0.94 mg/dL (ref 0.44–1.00)
GFR, Estimated: >60 mL/min (ref >60)
Glucose, Bld: 90 mg/dL (ref 70–99)
Potassium: 3.9 mmol/L (ref 3.5–5.1)
Sodium: 137 mmol/L (ref 135–145)
Total Bilirubin: 0.5 mg/dL (ref 0.0–1.2)
Total Protein: 7 g/dL (ref 6.5–8.1)

## 2024-03-27 MED ORDER — OXYCODONE HCL 5 MG PO TABS: 5.0000 mg | ORAL_TABLET | Freq: Three times a day (TID) | ORAL | 0 refills | Status: DC | PRN

## 2024-03-27 MED ORDER — ZOLEDRONIC ACID 4 MG/100ML IV SOLN
4.0000 mg | Freq: Once | INTRAVENOUS | Status: AC
  Administered 2024-03-27: 4 mg via INTRAVENOUS
  Filled 2024-03-27: qty 100

## 2024-03-27 NOTE — Progress Notes (Signed)
 [facility] OFFICE PROGRESS NOTE   Diagnosis: Multiple myeloma  INTERVAL HISTORY:   Katherine Beard returns as scheduled.  She continues Revlimid  maintenance.  She saw Dr. Volanda 02/28/2024 with recommendation to continue current treatment.  She denies nausea/vomiting.  No mouth sores.  No diarrhea.  No rash.  No change in chronic back pain.  She continues to exercise.  She takes oxycodone  as needed.  She denies any dental issues, no jaw pain.  For the past year or so she has noted episodes that start with tingling in a single finger or multiple fingers, followed by a cold sensation and then a white appearance of the skin.  Symptoms resolved over about 15 minutes.  She thinks this occurs maybe every other week.  Objective:  Vital signs in last 24 hours:  Blood pressure 110/79, pulse 60, temperature 98.1 F (36.7 C), temperature source Temporal, resp. rate 18, height 5' 2 (1.575 m), weight 147 lb 4.8 oz (66.8 kg), last menstrual period 03/06/2011, SpO2 100%.    HEENT: No thrush or ulcers. Resp: Lungs clear bilaterally. Cardio: Regular rate and rhythm. GI: No hepatosplenomegaly. Vascular: No leg edema. Skin: Normal skin appearance of the fingers/hands.   Lab Results:  Lab Results  Component Value Date   WBC 2.5 (L) 03/27/2024   HGB 14.7 03/27/2024   HCT 42.9 03/27/2024   MCV 94.5 03/27/2024   PLT 215 03/27/2024   NEUTROABS 1.4 (L) 03/27/2024    Imaging:  No results found.  Medications: I have reviewed the patient's current medications.  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 Increase serum free lambda light chains Serum M spike MRI pelvis 09/01/2020-innumerable small foci of signal abnormality concerning for multiple myeloma involvement Metastatic bone survey 09/03/2020-no discrete  lytic bone lesions, compression fractures at T12, L1, L2, and L3, osteopenia Cycle 1 RVD 09/14/2020 Daratumumab  beginning 09/28/2020 Cycle 2 RVD 10/05/2020 plus daratumumab  Week 3 daratumumab  10/12/2020 Normal lambda light chains 10/26/2020 Cycle 3 RVD plus daratumumab  10/26/2020 Cycle 4 RVD plus daratumumab  11/16/2020, daratumumab  changed to an every 2-week schedule beginning 11/23/2020 Cycle 5 RVD plus daratumumab  12/07/2020 Cycle 6 RVD plus daratumumab  12/28/2020 Normal lambda light chains 01/05/2021 Revlimid  maintenance 21/28 days beginning 01/18/2021 Stem cell harvest at [facility] 03/29/2021 Revlimid  resumed 03/30/2021 01/04/2023 normal light chains and negative serum immunofixation at [facility] 07/19/2023 normal serum light chains and negative serum immunofixation at [facility] Maintenance Revlimid  continued Normal serum light chains 09/25/2023 Normal serum light chains 12/04/2023 12/14/2023 bone survey: Stable compression fractures T12-L4, no acute change 02/28/2024 normal serum light chains   2. Severe back pain MRI lumbar spine July 12, 2020-subacute compression fractures at T12 and L1 MRI lumbar spine July 30, 2020-subacute T12 and L1 compression fractures, new subacute superior L2 endplate deformity MRI lumbar spine September 01, 2020 new/progressive compression fractures at L2 and L3, acute compression fracture at L4, evolution of T12 and L1 compression fractures MRI pelvis September 01, 2020-innumerable small foci of signal abnormality in the pelvis and femurs, no fracture identified, concerning for multiple myeloma versus metastases Zometa  beginning 09/21/2020-plan monthly x3 then every 3 months, next due 11/19/2021 MRI thoracic spine July 2022-new T3 compression fracture with multiple additional thoracic compression fractures 3. Melanoma left upper back 2014 4. History of diverticulitis 5. Mild hypercalcemia at presentation-resolved 6. Osteoporosis 7. [place] 8.  Depression-worsened depression symptoms 01/11/2021,  referred to psychology  Disposition: Katherine Beard appears stable.  She remains in clinical remission from multiple myeloma.  She will continue Revlimid  maintenance.  We reviewed the CBC from today.  She has mild neutropenia.  Precautions reviewed.  She understands to contact the office with fever, chills, other signs of infection.  She will receive Zometa  today.  She has pain secondary to chronic compression fractures.  She takes pain medication as needed, refill sent to her pharmacy today.  She also participates in regular exercise, massage therapy, other supportive measures.  Over the past year or so she has had episodes of self resolving tingling/cold sensation/white discoloration of the fingers.  Question Raynaud's.  She will return for follow-up and Zometa  in 3 months.  We are available to see her sooner if needed.    Olam Ned ANP/GNP-BC   03/27/2024  9:44 AM

## 2024-03-27 NOTE — Patient Instructions (Signed)
 [facility] A DEPT OF [facility]  Discharge Instructions: Thank you for choosing [facility] to provide your oncology and hematology care.   If you have a lab appointment with the Cancer Center, please go directly to the Cancer Center and check in at the registration area.   Wear comfortable clothing and clothing appropriate for easy access to any Portacath or PICC line.   We strive to give you quality time with your provider. You may need to reschedule your appointment if you arrive late (15 or more minutes).  Arriving late affects you and other patients whose appointments are after yours.  Also, if you miss three or more appointments without notifying the office, you may be dismissed from the clinic at the provider's discretion.      For prescription refill requests, have your pharmacy contact our office and allow 72 hours for refills to be completed.    Today you received the following chemotherapy and/or immunotherapy agents: zometa       To help prevent nausea and vomiting after your treatment, we encourage you to take your nausea medication as directed.  BELOW ARE SYMPTOMS THAT SHOULD BE REPORTED IMMEDIATELY: *FEVER GREATER THAN 100.4 F (38 C) OR HIGHER *CHILLS OR SWEATING *NAUSEA AND VOMITING THAT IS NOT CONTROLLED WITH YOUR NAUSEA MEDICATION *UNUSUAL SHORTNESS OF BREATH *UNUSUAL BRUISING OR BLEEDING *URINARY PROBLEMS (pain or burning when urinating, or frequent urination) *BOWEL PROBLEMS (unusual diarrhea, constipation, pain near the anus) TENDERNESS IN MOUTH AND THROAT WITH OR WITHOUT PRESENCE OF ULCERS (sore throat, sores in mouth, or a toothache) UNUSUAL RASH, SWELLING OR PAIN  UNUSUAL VAGINAL DISCHARGE OR ITCHING   Items with * indicate a potential emergency and should be followed up as soon as possible or go to the Emergency Department if any problems should occur.  Please show the CHEMOTHERAPY ALERT CARD or IMMUNOTHERAPY  ALERT CARD at check-in to the Emergency Department and triage nurse.  Should you have questions after your visit or need to cancel or reschedule your appointment, please contact [facility] - A DEPT OF [facility]  Dept: [phone]  and follow the prompts.  Office hours are 8:00 a.m. to 4:30 p.m. Monday - Friday. Please note that voicemails left after 4:00 p.m. may not be returned until the following business day.  We are closed weekends and major holidays. You have access to a nurse at all times for urgent questions. Please call the main number to the clinic Dept: [phone] and follow the prompts.   For any non-urgent questions, you may also contact your provider using MyChart. We now offer e-Visits for anyone 25 and older to request care online for non-urgent symptoms. For details visit [email or web address].   Also download the MyChart app! Go to the app store, search MyChart, open the app, select [place], and log in with your MyChart username and password.

## 2024-03-27 NOTE — Progress Notes (Signed)
 Patient seen by Olam Ned NP today  Vitals are within treatment parameters:Yes   Labs are within treatment parameters: Yes   Treatment plan has been signed: Yes   Per physician team, Patient is ready for treatment and there are NO modifications to the treatment plan.

## 2024-03-28 LAB — KAPPA/LAMBDA LIGHT CHAINS
Kappa free light chain: 15.7 mg/L (ref 3.3–19.4)
Kappa, lambda light chain ratio: 1.15 (ref 0.26–1.65)
Lambda free light chains: 13.6 mg/L (ref 5.7–26.3)

## 2024-03-28 LAB — IGG: IgG (Immunoglobin G), Serum: 870 mg/dL (ref 586–1602)

## 2024-03-29 LAB — PROTEIN ELECTROPHORESIS, SERUM
A/G Ratio: 1.3 (ref 0.7–1.7)
Albumin ELP: 3.7 g/dL (ref 2.9–4.4)
Alpha-1-Globulin: 0.3 g/dL (ref 0.0–0.4)
Alpha-2-Globulin: 0.8 g/dL (ref 0.4–1.0)
Beta Globulin: 0.9 g/dL (ref 0.7–1.3)
Gamma Globulin: 0.9 g/dL (ref 0.4–1.8)
Globulin, Total: 2.9 g/dL (ref 2.2–3.9)
Total Protein ELP: 6.6 g/dL (ref 6.0–8.5)

## 2024-04-04 ENCOUNTER — Other Ambulatory Visit: Payer: Self-pay | Admitting: Oncology

## 2024-04-04 DIAGNOSIS — C9000 Multiple myeloma not having achieved remission: Secondary | ICD-10-CM

## 2024-04-15 ENCOUNTER — Encounter: Payer: Self-pay | Admitting: Oncology

## 2024-04-15 ENCOUNTER — Other Ambulatory Visit: Payer: Self-pay | Admitting: *Deleted

## 2024-04-15 NOTE — Telephone Encounter (Signed)
 MyChart message requesting refill on methocarbamol . Refill sent.

## 2024-04-23 ENCOUNTER — Other Ambulatory Visit: Payer: Self-pay | Admitting: Nurse Practitioner

## 2024-04-23 ENCOUNTER — Encounter: Payer: Self-pay | Admitting: Oncology

## 2024-04-23 DIAGNOSIS — C9000 Multiple myeloma not having achieved remission: Secondary | ICD-10-CM

## 2024-04-23 MED ORDER — OXYCODONE HCL 5 MG PO TABS: 5.0000 mg | ORAL_TABLET | Freq: Three times a day (TID) | ORAL | 0 refills | Status: DC | PRN

## 2024-05-01 ENCOUNTER — Other Ambulatory Visit: Payer: Self-pay | Admitting: Oncology

## 2024-05-01 DIAGNOSIS — C9000 Multiple myeloma not having achieved remission: Secondary | ICD-10-CM

## 2024-05-10 ENCOUNTER — Other Ambulatory Visit: Payer: Self-pay | Admitting: Nurse Practitioner

## 2024-05-10 DIAGNOSIS — C9000 Multiple myeloma not having achieved remission: Secondary | ICD-10-CM

## 2024-05-11 ENCOUNTER — Other Ambulatory Visit: Payer: Self-pay | Admitting: Internal Medicine

## 2024-05-11 DIAGNOSIS — F419 Anxiety disorder, unspecified: Secondary | ICD-10-CM

## 2024-05-13 ENCOUNTER — Encounter: Payer: Self-pay | Admitting: Oncology

## 2024-05-13 ENCOUNTER — Encounter (INDEPENDENT_AMBULATORY_CARE_PROVIDER_SITE_OTHER): Payer: Self-pay | Admitting: Internal Medicine

## 2024-05-13 ENCOUNTER — Ambulatory Visit (INDEPENDENT_AMBULATORY_CARE_PROVIDER_SITE_OTHER): Payer: Self-pay | Admitting: Internal Medicine

## 2024-05-13 VITALS — BP 128/86 | HR 60 | Temp 97.4°F | Ht 62.0 in | Wt 138.0 lb

## 2024-05-13 DIAGNOSIS — C9000 Multiple myeloma not having achieved remission: Secondary | ICD-10-CM | POA: Diagnosis not present

## 2024-05-13 DIAGNOSIS — E669 Obesity, unspecified: Secondary | ICD-10-CM | POA: Diagnosis not present

## 2024-05-13 DIAGNOSIS — R638 Other symptoms and signs concerning food and fluid intake: Secondary | ICD-10-CM | POA: Diagnosis not present

## 2024-05-13 DIAGNOSIS — Z6825 Body mass index (BMI) 25.0-25.9, adult: Secondary | ICD-10-CM | POA: Diagnosis not present

## 2024-05-13 MED ORDER — TIRZEPATIDE-WEIGHT MANAGEMENT 10 MG/0.5ML ~~LOC~~ SOLN: 10.0000 mg | SUBCUTANEOUS | 1 refills | Status: DC

## 2024-05-13 NOTE — Progress Notes (Signed)
 Office: [phone]  /  Fax: [phone]  Weight Summary And Body Composition Analysis (BIA)  Vitals Temp: (!) 97.4 F (36.3 C) BP: 128/86 Pulse Rate: 60 SpO2: 100 %   Anthropometric Measurements Height: 5' 2 (1.575 m) Weight: 138 lb (62.6 kg) BMI (Calculated): 25.23 Weight at Last Visit: 144 lb Weight Lost Since Last Visit: 6 lb Weight Gained Since Last Visit: 0 lb Starting Weight: 172 lb Total Weight Loss (lbs): 34 lb (15.4 kg) Peak Weight: 180 lb   Body Composition  Body Fat %: 31.8 % Fat Mass (lbs): 44 lbs Muscle Mass (lbs): 89.6 lbs Total Body Water (lbs): 64.2 lbs Visceral Fat Rating : 7    RMR: 1339  Today's Visit #: 18  Starting Date: 04/13/23   Subjective   Chief Complaint: Obesity  Katherine Beard is here to discuss her progress with her obesity treatment plan. She is following the Category 1 plan - 1000 kcal per day and states she is following her eating plan approximately 70-80% of the time. She states she is exercising 30 minutes 7 times per week..  Weight Progress Since Last Visit:  Since last office visit she has lost 6 pounds. She reports good adherence to reduced calorie nutritional plan. She is reporting problems with loss of appetite secondary to pain.  Her pain is also limiting her mobility and physical activity levels.  Nutritional 24 HR Recall: Intake consistent with prescribed nutritional plan  Challenges affecting patient progress: orthopedic problems, medical conditions or chronic pain affecting mobility and menopause.   Orexigenic Control: Denies problems with appetite and hunger signals.  Denies problems with satiety and satiation.  Denies problems with eating patterns and portion control.  Denies abnormal cravings. Denies feeling deprived or restricted.   Pharmacotherapy for weight management: She is currently taking Zepbound  with adequate clinical response  and without side effects..   Assessment and Plan   Treatment Plan  For Obesity:  Recommended Dietary Goals  Katherine Beard is currently in the action stage of change. As such, her goal is to continue weight management plan. She has agreed to: continue current plan  Behavioral Health and Counseling  We discussed the following behavioral modification strategies today: continue to work on maintaining a reduced calorie state, getting the recommended amount of protein, incorporating whole foods, making healthy choices, staying well hydrated and practicing mindfulness when eating. and increase protein intake, fibrous foods (25 grams per day for women, 30 grams for men) and water to improve satiety and decrease hunger signals. .  Additional education and resources provided today: Given handouts on the maintenance phase and also staying on track during the holidays  Recommended Physical Activity Goals  Katherine Beard has been advised to work up to 150 minutes of moderate intensity aerobic activity a week and strengthening exercises 2-3 times per week for cardiovascular health, weight loss maintenance and preservation of muscle mass.   She has agreed to :  Think about enjoyable ways to increase daily physical activity and overcoming barriers to exercise, Increase physical activity in their day and reduce sedentary time (increase NEAT)., Increase volume of physical activity to a goal of 240 minutes a week, and Combine aerobic and strengthening exercises for efficiency and improved cardiometabolic health.  Pharmacotherapy and Medical Interventions  Adequate clinical response to anti-obesity medication, continue current regimen  Associated Conditions Impacted by Obesity Treatment  Assessment & Plan Generalized obesity with starting BMI 31 BMI 25.0-25.9,adult Weight: decrease of 40 lb (22.5%) over 1 year, 10 months  Start: 06/28/2022 178  lb (80.7 kg)  End: 05/13/2024 138 lb (62.6 kg)  Currently on Zepbound  10 mg daily since June 2024. Reports significant weight loss of 6 pounds  over two months, likely due to appetite suppression from medication and chronic pain. Concerns about muscle loss and decreased bone density due to inadequate nutrition. BMI is 25, with a body fat percentage of 31%. Muscle mass has decreased, likely due to inadequate caloric intake and pain-related appetite suppression. Discussed the importance of maintaining adequate nutrition, particularly protein intake, to prevent muscle and bone density loss. Emphasized the need to avoid further weight loss and transition to a maintenance phase after the holidays. - Continue Zepbound  10 mg daily.  Consider a decrease of muscle loss is still a problem in January. - Ensure intake of at least 1000 calories per day. - Prioritize protein intake of 25-30 grams per meal. - Consider switching to vial form of medication for cost savings. - Provided information on maintenance phase of weight management. Abnormal food appetite Improved on Zepbound .  She had increased orexigenic signaling and cravings.  This is secondary to an abnormal energy regulation system and pathological neurohormonal pathways characteristic of excess adiposity.  In addition to nutritional and behavioral strategies she benefits from ongoing pharmacotherapy.    Multiple myeloma, remission status unspecified (HCC) Reports significant back pain, likely related to multiple myeloma. Pain has been severe enough to affect daily activities and nutrition. Scheduled for steroid injections into SI joints for pain relief. Considering consulting a surgeon in [facility] for further evaluation and potential advanced procedures. - Proceed with scheduled steroid injections into SI joints. - Consider virtual consultation with a surgeon in [facility] for further evaluation.     Objective   Physical Exam:  Blood pressure 128/86, pulse 60, temperature (!) 97.4 F (36.3 C), height 5' 2 (1.575 m), weight 138 lb (62.6 kg), last menstrual period 03/06/2011, SpO2  100%. Body mass index is 25.24 kg/m.  General: She is overweight, cooperative, alert, well developed, and in no acute distress. PSYCH: Has normal mood, affect and thought process.   HEENT: EOMI, sclerae are anicteric. Lungs: Normal breathing effort, no conversational dyspnea. Extremities: No edema.  Neurologic: No gross sensory or motor deficits. No tremors or fasciculations noted.    Diagnostic Data Reviewed:  BMET    Component Value Date/Time   NA 137 03/27/2024 0848   K 3.9 03/27/2024 0848   CL 101 03/27/2024 0848   CO2 24 03/27/2024 0848   GLUCOSE 90 03/27/2024 0848   BUN 18 03/27/2024 0848   CREATININE 0.94 03/27/2024 0848   CALCIUM  9.8 03/27/2024 0848   GFRNONAA >60 03/27/2024 0848   GFRAA 78 12/11/2006 1036   Lab Results  Component Value Date   HGBA1C 5.4 07/13/2022   HGBA1C 5.6 12/11/2006   Lab Results  Component Value Date   INSULIN  4.4 07/13/2022   Lab Results  Component Value Date   TSH 1.360 07/13/2022   CBC    Component Value Date/Time   WBC 2.5 (L) 03/27/2024 0848   WBC 3.9 (L) 09/28/2023 1116   RBC 4.54 03/27/2024 0848   HGB 14.7 03/27/2024 0848   HCT 42.9 03/27/2024 0848   PLT 215 03/27/2024 0848   MCV 94.5 03/27/2024 0848   MCH 32.4 03/27/2024 0848   MCHC 34.3 03/27/2024 0848   RDW 12.5 03/27/2024 0848   Iron Studies    Component Value Date/Time   IRON 65 07/08/2020 1509   FERRITIN 109.9 09/28/2023 1116   IRONPCTSAT 17.4 (  L) 07/08/2020 1509   Lipid Panel     Component Value Date/Time   CHOL 134 09/28/2023 1116   CHOL 224 (H) 01/16/2023 1115   TRIG 60.0 09/28/2023 1116   HDL 59.40 09/28/2023 1116   HDL 65 01/16/2023 1115   CHOLHDL 2 09/28/2023 1116   VLDL 12.0 09/28/2023 1116   LDLCALC 62 09/28/2023 1116   LDLCALC 142 (H) 01/16/2023 1115   LDLDIRECT 173.7 11/26/2010 0756   Hepatic Function Panel     Component Value Date/Time   PROT 7.0 03/27/2024 0848   ALBUMIN 4.6 03/27/2024 0848   AST 18 03/27/2024 0848   ALT 19  03/27/2024 0848   ALKPHOS 30 (L) 03/27/2024 0848   BILITOT 0.5 03/27/2024 0848   BILIDIR 0.1 11/26/2010 0756   IBILI 0.4 11/30/2007 2242      Component Value Date/Time   TSH 1.360 07/13/2022 0931   Nutritional Lab Results  Component Value Date   VD25OH 29.90 (L) 09/28/2023   VD25OH 77.3 01/16/2023   VD25OH 29.6 (L) 07/13/2022    Medications: Outpatient Encounter Medications as of 05/13/2024  Medication Sig Note   tirzepatide  10 MG/0.5ML injection vial Inject 10 mg into the skin once a week.    acetaminophen  (TYLENOL ) 500 MG tablet Take 500 mg by mouth every 4 (four) hours as needed for moderate pain. Takes 2 tabs Q4H    COMBIPATCH 0.05-0.25 MG/DAY Place 1 patch onto the skin.    desvenlafaxine  (PRISTIQ ) 25 MG 24 hr tablet TAKE 1 TABLET (25 MG TOTAL) BY MOUTH DAILY.    diazepam  (VALIUM ) 5 MG tablet Take 1 tablet (5 mg total) by mouth 2 (two) times daily as needed for anxiety or muscle spasms (Do not drive). Do not drive    diphenhydramine -acetaminophen  (TYLENOL  PM) 25-500 MG TABS tablet Take 2 tablets by mouth as needed.    Ibuprofen 200 MG CAPS Take 600 mg by mouth as needed. 09/21/2020: Takes 2-3 times/day   lenalidomide  (REVLIMID ) 10 MG capsule Take 1 capsule (10 mg total) by mouth daily. Take daily for 21 days on, then 7 day rest. Repeat every 28 days    melatonin 5 MG TABS Take 10 mg by mouth at bedtime.    methocarbamol  (ROBAXIN ) 500 MG tablet TAKE 1 TABLET BY MOUTH TWICE DAILY AS NEEDED    Multiple Vitamin (MULTIVITAMIN) capsule Take 1 capsule by mouth daily.    Nutritional Supplements (VITAL HIGH PROTEIN PO) Take 1 Scoop by mouth daily. Puts in coffee    oxyCODONE  (OXY IR/ROXICODONE ) 5 MG immediate release tablet Take 1-2 tablets (5-10 mg total) by mouth every 8 (eight) hours as needed.    rosuvastatin  (CRESTOR ) 20 MG tablet TAKE 1 TABLET BY MOUTH EVERY DAY    [DISCONTINUED] tirzepatide  (ZEPBOUND ) 10 MG/0.5ML Pen Inject 10 mg into the skin once a week.    No  facility-administered encounter medications on file as of 05/13/2024.     Follow-Up   Return in about 8 weeks (around 07/08/2024) for For Weight Mangement with Dr. Francyne.SABRA She was informed of the importance of frequent follow up visits to maximize her success with intensive lifestyle modifications for her multiple health conditions.  Attestation Statement   Reviewed by clinician on day of visit: allergies, medications, problem list, medical history, surgical history, family history, social history, and previous encounter notes.     Lucas Francyne, MD

## 2024-05-13 NOTE — Assessment & Plan Note (Signed)
 Reports significant back pain, likely related to multiple myeloma. Pain has been severe enough to affect daily activities and nutrition. Scheduled for steroid injections into SI joints for pain relief. Considering consulting a surgeon in [facility] for further evaluation and potential advanced procedures. - Proceed with scheduled steroid injections into SI joints. - Consider virtual consultation with a surgeon in [facility] for further evaluation.

## 2024-05-13 NOTE — Assessment & Plan Note (Signed)
 Improved on Zepbound .  She had increased orexigenic signaling and cravings.  This is secondary to an abnormal energy regulation system and pathological neurohormonal pathways characteristic of excess adiposity.  In addition to nutritional and behavioral strategies she benefits from ongoing pharmacotherapy.

## 2024-05-13 NOTE — Assessment & Plan Note (Signed)
 Weight: decrease of 40 lb (22.5%) over 1 year, 10 months  Start: 06/28/2022 178 lb (80.7 kg)  End: 05/13/2024 138 lb (62.6 kg)  Currently on Zepbound  10 mg daily since June 2024. Reports significant weight loss of 6 pounds over two months, likely due to appetite suppression from medication and chronic pain. Concerns about muscle loss and decreased bone density due to inadequate nutrition. BMI is 25, with a body fat percentage of 31%. Muscle mass has decreased, likely due to inadequate caloric intake and pain-related appetite suppression. Discussed the importance of maintaining adequate nutrition, particularly protein intake, to prevent muscle and bone density loss. Emphasized the need to avoid further weight loss and transition to a maintenance phase after the holidays. - Continue Zepbound  10 mg daily.  Consider a decrease of muscle loss is still a problem in January. - Ensure intake of at least 1000 calories per day. - Prioritize protein intake of 25-30 grams per meal. - Consider switching to vial form of medication for cost savings. - Provided information on maintenance phase of weight management.

## 2024-05-14 ENCOUNTER — Encounter: Payer: Self-pay | Admitting: Internal Medicine

## 2024-05-14 DIAGNOSIS — F419 Anxiety disorder, unspecified: Secondary | ICD-10-CM

## 2024-05-15 MED ORDER — DESVENLAFAXINE SUCCINATE ER 25 MG PO TB24: 25.0000 mg | ORAL_TABLET | Freq: Every day | ORAL | 3 refills | Status: AC

## 2024-05-20 ENCOUNTER — Encounter: Payer: Self-pay | Admitting: Oncology

## 2024-05-21 ENCOUNTER — Other Ambulatory Visit: Payer: Self-pay | Admitting: Nurse Practitioner

## 2024-05-21 DIAGNOSIS — C9000 Multiple myeloma not having achieved remission: Secondary | ICD-10-CM

## 2024-05-21 MED ORDER — OXYCODONE HCL 5 MG PO TABS: 5.0000 mg | ORAL_TABLET | Freq: Three times a day (TID) | ORAL | 0 refills | Status: DC | PRN

## 2024-05-30 ENCOUNTER — Other Ambulatory Visit: Payer: Self-pay | Admitting: Oncology

## 2024-05-30 DIAGNOSIS — C9000 Multiple myeloma not having achieved remission: Secondary | ICD-10-CM

## 2024-06-03 ENCOUNTER — Encounter: Payer: Self-pay | Admitting: Oncology

## 2024-06-04 ENCOUNTER — Encounter: Payer: Self-pay | Admitting: Oncology

## 2024-06-05 ENCOUNTER — Other Ambulatory Visit: Payer: Self-pay | Admitting: *Deleted

## 2024-06-05 DIAGNOSIS — C9000 Multiple myeloma not having achieved remission: Secondary | ICD-10-CM

## 2024-06-05 MED ORDER — METHOCARBAMOL 500 MG PO TABS: 500.0000 mg | ORAL_TABLET | Freq: Two times a day (BID) | ORAL | 1 refills | Status: DC | PRN

## 2024-06-05 NOTE — Telephone Encounter (Signed)
 Sent MyChart message requesting refill this week on methocarbamol 

## 2024-06-07 ENCOUNTER — Other Ambulatory Visit (INDEPENDENT_AMBULATORY_CARE_PROVIDER_SITE_OTHER): Payer: Self-pay | Admitting: Internal Medicine

## 2024-06-07 DIAGNOSIS — E669 Obesity, unspecified: Secondary | ICD-10-CM

## 2024-06-07 DIAGNOSIS — R638 Other symptoms and signs concerning food and fluid intake: Secondary | ICD-10-CM

## 2024-06-11 ENCOUNTER — Encounter (INDEPENDENT_AMBULATORY_CARE_PROVIDER_SITE_OTHER): Payer: Self-pay | Admitting: Internal Medicine

## 2024-06-11 ENCOUNTER — Other Ambulatory Visit (INDEPENDENT_AMBULATORY_CARE_PROVIDER_SITE_OTHER): Payer: Self-pay | Admitting: Internal Medicine

## 2024-06-11 ENCOUNTER — Other Ambulatory Visit (INDEPENDENT_AMBULATORY_CARE_PROVIDER_SITE_OTHER): Payer: Self-pay

## 2024-06-11 DIAGNOSIS — E669 Obesity, unspecified: Secondary | ICD-10-CM

## 2024-06-11 DIAGNOSIS — R638 Other symptoms and signs concerning food and fluid intake: Secondary | ICD-10-CM

## 2024-06-11 MED ORDER — TIRZEPATIDE-WEIGHT MANAGEMENT 10 MG/0.5ML ~~LOC~~ SOLN: 10.0000 mg | SUBCUTANEOUS | 1 refills | Status: DC

## 2024-06-18 ENCOUNTER — Ambulatory Visit

## 2024-06-18 ENCOUNTER — Ambulatory Visit: Admitting: Oncology

## 2024-06-18 ENCOUNTER — Other Ambulatory Visit: Attending: Nurse Practitioner

## 2024-06-18 VITALS — BP 135/86 | HR 52 | Temp 98.0°F | Resp 16

## 2024-06-18 VITALS — BP 119/74 | HR 60 | Temp 97.8°F | Resp 18 | Ht 62.0 in | Wt 136.3 lb

## 2024-06-18 DIAGNOSIS — C9000 Multiple myeloma not having achieved remission: Secondary | ICD-10-CM | POA: Diagnosis not present

## 2024-06-18 DIAGNOSIS — G8929 Other chronic pain: Secondary | ICD-10-CM | POA: Insufficient documentation

## 2024-06-18 DIAGNOSIS — M8008XA Age-related osteoporosis with current pathological fracture, vertebra(e), initial encounter for fracture: Secondary | ICD-10-CM | POA: Diagnosis not present

## 2024-06-18 DIAGNOSIS — Z23 Encounter for immunization: Secondary | ICD-10-CM

## 2024-06-18 DIAGNOSIS — C9001 Multiple myeloma in remission: Secondary | ICD-10-CM | POA: Insufficient documentation

## 2024-06-18 DIAGNOSIS — F32A Depression, unspecified: Secondary | ICD-10-CM | POA: Insufficient documentation

## 2024-06-18 LAB — CBC WITH DIFFERENTIAL (CANCER CENTER ONLY)
Abs Immature Granulocytes: 0.01 K/uL (ref 0.00–0.07)
Basophils Absolute: 0 K/uL (ref 0.0–0.1)
Basophils Relative: 1 %
Eosinophils Absolute: 0 K/uL (ref 0.0–0.5)
Eosinophils Relative: 1 %
HCT: 39.5 % (ref 36.0–46.0)
Hemoglobin: 13.8 g/dL (ref 12.0–15.0)
Immature Granulocytes: 0 %
Lymphocytes Relative: 17 %
Lymphs Abs: 0.5 K/uL — ABNORMAL LOW (ref 0.7–4.0)
MCH: 32.7 pg (ref 26.0–34.0)
MCHC: 34.9 g/dL (ref 30.0–36.0)
MCV: 93.6 fL (ref 80.0–100.0)
Monocytes Absolute: 0.2 K/uL (ref 0.1–1.0)
Monocytes Relative: 8 %
Neutro Abs: 2.1 K/uL (ref 1.7–7.7)
Neutrophils Relative %: 73 %
Platelet Count: 206 K/uL (ref 150–400)
RBC: 4.22 MIL/uL (ref 3.87–5.11)
RDW: 13.8 % (ref 11.5–15.5)
WBC Count: 2.9 K/uL — ABNORMAL LOW (ref 4.0–10.5)
nRBC: 0 % (ref 0.0–0.2)

## 2024-06-18 LAB — CMP (CANCER CENTER ONLY)
ALT: 11 U/L (ref 0–44)
AST: 15 U/L (ref 15–41)
Albumin: 4.2 g/dL (ref 3.5–5.0)
Alkaline Phosphatase: 23 U/L — ABNORMAL LOW (ref 38–126)
Anion gap: 10 (ref 5–15)
BUN: 14 mg/dL (ref 8–23)
CO2: 24 mmol/L (ref 22–32)
Calcium: 9.3 mg/dL (ref 8.9–10.3)
Chloride: 102 mmol/L (ref 98–111)
Creatinine: 0.78 mg/dL (ref 0.44–1.00)
GFR, Estimated: >60 mL/min (ref >60)
Glucose, Bld: 84 mg/dL (ref 70–99)
Potassium: 3.5 mmol/L (ref 3.5–5.1)
Sodium: 136 mmol/L (ref 135–145)
Total Bilirubin: 0.7 mg/dL (ref 0.0–1.2)
Total Protein: 6.3 g/dL — ABNORMAL LOW (ref 6.5–8.1)

## 2024-06-18 MED ORDER — SODIUM CHLORIDE 0.9 % IV SOLN: Freq: Once | INTRAVENOUS | Status: AC

## 2024-06-18 MED ORDER — OXYCODONE HCL 5 MG PO TABS: 5.0000 mg | ORAL_TABLET | Freq: Three times a day (TID) | ORAL | 0 refills | Status: DC | PRN

## 2024-06-18 MED ORDER — INFLUENZA VIRUS VACC SPLIT PF (FLUZONE) 0.5 ML IM SUSY
0.5000 mL | PREFILLED_SYRINGE | Freq: Once | INTRAMUSCULAR | Status: AC
  Administered 2024-06-18: 11:00:00 0.5 mL via INTRAMUSCULAR
  Filled 2024-06-18: qty 0.5

## 2024-06-18 MED ORDER — ZOLEDRONIC ACID 4 MG/100ML IV SOLN
4.0000 mg | Freq: Once | INTRAVENOUS | Status: AC
  Administered 2024-06-18: 11:00:00 4 mg via INTRAVENOUS
  Filled 2024-06-18: qty 100

## 2024-06-18 NOTE — Progress Notes (Signed)
 Katherine Beard   Diagnosis: Multiple myeloma  INTERVAL HISTORY:   Katherine Beard returns as scheduled.  She continues Revlimid  maintenance.  She last received Zometa  03/27/2024.  She has 1 day of diarrhea when she starts the Revlimid .  No nausea or abdominal pain.  No neuropathy symptoms.  She has developed white discoloration of the fingers intermittently.  This has occurred over the past 6 months and last for approximately 20 minutes.  She does not have discoloration of the toes. She began another cycle of Revlimid  on 06/14/2024 She has chronic back pain.  She recently received a steroid injection at the sacroiliac.  This did not help.  She takes 2-4 oxycodone  tablets per day.  She also takes Tylenol  and ibuprofen. Objective:  Vital signs in last 24 hours:  Blood pressure 119/74, pulse 60, temperature 97.8 F (36.6 C), temperature source Temporal, resp. rate 18, height 5' 2 (1.575 m), weight 136 lb 4.8 oz (61.8 kg), last menstrual period 03/06/2011, SpO2 100%.    HEENT: No thrush or ulcers Resp: Lungs clear bilaterally Cardio: Regular rate and rhythm GI: No hepatosplenomegaly Vascular: No leg edema Skin: No rash, the hands appear unremarkable  Lab Results:  Lab Results  Component Value Date   WBC 2.9 (L) 06/18/2024   HGB 13.8 06/18/2024   HCT 39.5 06/18/2024   MCV 93.6 06/18/2024   PLT 206 06/18/2024   NEUTROABS 2.1 06/18/2024    CMP  Lab Results  Component Value Date   NA 137 03/27/2024   K 3.9 03/27/2024   CL 101 03/27/2024   CO2 24 03/27/2024   GLUCOSE 90 03/27/2024   BUN 18 03/27/2024   CREATININE 0.94 03/27/2024   CALCIUM  9.8 03/27/2024   PROT 7.0 03/27/2024   ALBUMIN 4.6 03/27/2024   AST 18 03/27/2024   ALT 19 03/27/2024   ALKPHOS 30 (L) 03/27/2024   BILITOT 0.5 03/27/2024   GFRNONAA >60 03/27/2024   GFRAA 78 12/11/2006    Medications: I have reviewed the patient's current medications.   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 Increase serum free lambda light chains Serum M spike MRI pelvis 09/01/2020-innumerable small foci of signal abnormality concerning for multiple myeloma involvement Metastatic bone survey 09/03/2020-no discrete lytic bone lesions, compression fractures at T12, L1, L2, and L3, osteopenia Cycle 1 RVD 09/14/2020 Daratumumab  beginning 09/28/2020 Cycle 2 RVD 10/05/2020 plus daratumumab  Week 3 daratumumab  10/12/2020 Normal lambda light chains 10/26/2020 Cycle 3 RVD plus daratumumab  10/26/2020 Cycle 4 RVD plus daratumumab  11/16/2020, daratumumab  changed to an every 2-week schedule beginning 11/23/2020 Cycle 5 RVD plus daratumumab  12/07/2020 Cycle 6 RVD plus daratumumab  12/28/2020 Normal lambda light chains 01/05/2021 Revlimid  maintenance 21/28 days beginning 01/18/2021 Stem cell harvest at [facility] 03/29/2021 Revlimid  resumed 03/30/2021 01/04/2023 normal light chains and negative serum immunofixation at [facility] 07/19/2023 normal serum light chains and negative serum immunofixation at [facility] Maintenance Revlimid  continued Normal serum light chains 09/25/2023 Normal serum light chains 12/04/2023 12/14/2023 bone survey: Stable compression fractures T12-L4, no acute change 02/28/2024 normal serum light chains   2. Severe back pain MRI lumbar spine July 12, 2020-subacute compression fractures at T12 and L1 MRI lumbar spine July 30, 2020-subacute T12 and L1 compression fractures, new subacute superior L2 endplate deformity MRI lumbar spine September 01, 2020 new/progressive compression fractures at L2 and L3, acute compression fracture at L4, evolution of  T12 and L1 compression fractures MRI pelvis September 01, 2020-innumerable small foci of signal abnormality in the pelvis and femurs, no fracture identified, concerning for multiple myeloma versus  metastases Zometa  beginning 09/21/2020-plan monthly x3 then every 3 months MRI thoracic spine July 2022-new T3 compression fracture with multiple additional thoracic compression fractures 3. Melanoma left upper back 2014 4. History of diverticulitis 5. Mild hypercalcemia at presentation-resolved 6. Osteoporosis 7. [place] 8.  Depression-worsened depression symptoms 01/11/2021, referred to psychology     Disposition: Katherine Beard is in clinical remission from multiple myeloma.  She continues Revlimid  maintenance.  We will follow-up on the myeloma panel from today.  She continues every 38-month Zometa .  She has chronic back pain secondary to compression fractures from multiple myeloma.  She uses Tylenol , ibuprofen, and oxycodone  as needed for pain.  She will return for an office visit and Zometa  in 3 months.  She received an influenza vaccine today.  She will obtain a pneumococcal vaccine.    Arley Hof, MD  06/18/2024  9:35 AM

## 2024-06-18 NOTE — Patient Instructions (Signed)
 [facility] A DEPT OF [facility]  Discharge Instructions: Thank you for choosing [facility] to provide your oncology and hematology care.   If you have a lab appointment with the Cancer Center, please go directly to the Cancer Center and check in at the registration area.   Wear comfortable clothing and clothing appropriate for easy access to any Portacath or PICC line.   We strive to give you quality time with your provider. You may need to reschedule your appointment if you arrive late (15 or more minutes).  Arriving late affects you and other patients whose appointments are after yours.  Also, if you miss three or more appointments without notifying the office, you may be dismissed from the clinic at the provider's discretion.      For prescription refill requests, have your pharmacy contact our office and allow 72 hours for refills to be completed.    Today you received the following chemotherapy and/or immunotherapy agents: zometa       To help prevent nausea and vomiting after your treatment, we encourage you to take your nausea medication as directed.  BELOW ARE SYMPTOMS THAT SHOULD BE REPORTED IMMEDIATELY: *FEVER GREATER THAN 100.4 F (38 C) OR HIGHER *CHILLS OR SWEATING *NAUSEA AND VOMITING THAT IS NOT CONTROLLED WITH YOUR NAUSEA MEDICATION *UNUSUAL SHORTNESS OF BREATH *UNUSUAL BRUISING OR BLEEDING *URINARY PROBLEMS (pain or burning when urinating, or frequent urination) *BOWEL PROBLEMS (unusual diarrhea, constipation, pain near the anus) TENDERNESS IN MOUTH AND THROAT WITH OR WITHOUT PRESENCE OF ULCERS (sore throat, sores in mouth, or a toothache) UNUSUAL RASH, SWELLING OR PAIN  UNUSUAL VAGINAL DISCHARGE OR ITCHING   Items with * indicate a potential emergency and should be followed up as soon as possible or go to the Emergency Department if any problems should occur.  Please show the CHEMOTHERAPY ALERT CARD or IMMUNOTHERAPY  ALERT CARD at check-in to the Emergency Department and triage nurse.  Should you have questions after your visit or need to cancel or reschedule your appointment, please contact [facility] - A DEPT OF [facility]  Dept: [phone]  and follow the prompts.  Office hours are 8:00 a.m. to 4:30 p.m. Monday - Friday. Please note that voicemails left after 4:00 p.m. may not be returned until the following business day.  We are closed weekends and major holidays. You have access to a nurse at all times for urgent questions. Please call the main number to the clinic Dept: [phone] and follow the prompts.   For any non-urgent questions, you may also contact your provider using MyChart. We now offer e-Visits for anyone 25 and older to request care online for non-urgent symptoms. For details visit [email or web address].   Also download the MyChart app! Go to the app store, search MyChart, open the app, select [place], and log in with your MyChart username and password.

## 2024-06-18 NOTE — Progress Notes (Signed)
 Patient seen by Dr. Arley Hof today  Vitals are within treatment parameters:Yes   Labs are within treatment parameters: Yes   Treatment plan has been signed: Yes   Per physician team, Patient is ready for treatment and there are NO modifications to the treatment plan. Would also like flu vaccine today.

## 2024-06-19 LAB — KAPPA/LAMBDA LIGHT CHAINS
Kappa free light chain: 10.7 mg/L (ref 3.3–19.4)
Kappa, lambda light chain ratio: 1.13 (ref 0.26–1.65)
Lambda free light chains: 9.5 mg/L (ref 5.7–26.3)

## 2024-06-21 LAB — MULTIPLE MYELOMA PANEL, SERUM
Albumin SerPl Elph-Mcnc: 3.6 g/dL (ref 2.9–4.4)
Albumin/Glob SerPl: 1.6 (ref 0.7–1.7)
Alpha 1: 0.2 g/dL (ref 0.0–0.4)
Alpha2 Glob SerPl Elph-Mcnc: 0.6 g/dL (ref 0.4–1.0)
B-Globulin SerPl Elph-Mcnc: 0.7 g/dL (ref 0.7–1.3)
Gamma Glob SerPl Elph-Mcnc: 0.7 g/dL (ref 0.4–1.8)
Globulin, Total: 2.3 g/dL (ref 2.2–3.9)
IgA: 42 mg/dL — ABNORMAL LOW (ref 87–352)
IgG (Immunoglobin G), Serum: 740 mg/dL (ref 586–1602)
IgM (Immunoglobulin M), Srm: 20 mg/dL — ABNORMAL LOW (ref 26–217)
Total Protein ELP: 5.9 g/dL — ABNORMAL LOW (ref 6.0–8.5)

## 2024-07-01 ENCOUNTER — Other Ambulatory Visit: Payer: Self-pay | Admitting: Oncology

## 2024-07-01 DIAGNOSIS — C9000 Multiple myeloma not having achieved remission: Secondary | ICD-10-CM

## 2024-07-02 ENCOUNTER — Encounter: Payer: Self-pay | Admitting: Oncology

## 2024-07-09 ENCOUNTER — Encounter: Payer: Self-pay | Admitting: Oncology

## 2024-07-09 ENCOUNTER — Ambulatory Visit (INDEPENDENT_AMBULATORY_CARE_PROVIDER_SITE_OTHER): Payer: Self-pay | Admitting: Internal Medicine

## 2024-07-09 ENCOUNTER — Encounter (INDEPENDENT_AMBULATORY_CARE_PROVIDER_SITE_OTHER): Payer: Self-pay | Admitting: Internal Medicine

## 2024-07-09 VITALS — BP 117/68 | HR 82 | Temp 97.9°F | Ht 62.0 in | Wt 129.0 lb

## 2024-07-09 DIAGNOSIS — E669 Obesity, unspecified: Secondary | ICD-10-CM

## 2024-07-09 DIAGNOSIS — E782 Mixed hyperlipidemia: Secondary | ICD-10-CM

## 2024-07-09 DIAGNOSIS — Z6823 Body mass index (BMI) 23.0-23.9, adult: Secondary | ICD-10-CM

## 2024-07-09 DIAGNOSIS — C9000 Multiple myeloma not having achieved remission: Secondary | ICD-10-CM

## 2024-07-09 DIAGNOSIS — R638 Other symptoms and signs concerning food and fluid intake: Secondary | ICD-10-CM

## 2024-07-09 MED ORDER — ZEPBOUND 10 MG/0.5ML ~~LOC~~ SOAJ: 10.0000 mg | SUBCUTANEOUS | 2 refills | Status: AC

## 2024-07-09 NOTE — Assessment & Plan Note (Signed)
 Recommend she have an LPA and high-sensitivity CRP drawn at her next PCPs office visit where she can have some blood work.  This will help with risk stratification as she is interested in coming off statin.

## 2024-07-09 NOTE — Assessment & Plan Note (Signed)
 Stable.  Has had skeletal survey which has shown decrease activity.  She is on Zometa  and Revlimid .  She has a history of osteoporosis therefore further weight loss is not recommended.

## 2024-07-09 NOTE — Assessment & Plan Note (Signed)
 Weight: decrease of 49 lb (27.5%) over 2 years  Start: 06/28/2022 178 lb (80.7 kg)  End: 07/09/2024 129 lb (58.5 kg)  She has successfully lost nine pounds since the last visit, adhering to a low-carb, 1000-calorie nutrition plan 90% of the time and exercising five days a week for 30-60 minutes. Her body composition is ideal with a body fat percentage of 29.6%, fat mass of 38 pounds, and a visceral fat rating of 7. Her BMI is 23, and she is satisfied with her current weight between 130 and 135 pounds. The focus is now on transitioning to a maintenance phase to sustain her progress.  Any further weight loss will increase the risk of muscle loss.  Her hand grip strength was adequate at 66 pounds today. - Increase daily caloric intake to 1300 calories. - Continue current exercise regimen of 240 minutes per week. - Continue Zepbound  at 10 mg once a week for now consider down titration of medication as she is now in the maintenance phase.  She expressed some interest in trying Wegovy now that is in a pill form which may be a reasonable strategy for maintenance. - Will schedule follow-up appointment in early March to assess progress and adjust plan as needed. -She will be having blood work

## 2024-07-09 NOTE — Progress Notes (Signed)
 "  Office: [phone]  /  Fax: [phone]  Weight Summary and Body Composition Analysis (BIA)  Vitals Temp: 97.9 F (36.6 C) BP: 117/68 Pulse Rate: 82 SpO2: 100 %   Anthropometric Measurements Height: 5' 2 (1.575 m) Weight: 129 lb (58.5 kg) BMI (Calculated): 23.59 Weight at Last Visit: 138 lb Weight Lost Since Last Visit: 9 lb Weight Gained Since Last Visit: 0 lb Starting Weight: 172 lb Total Weight Loss (lbs): 43 lb (19.5 kg) Peak Weight: 180 lb   Body Composition  Body Fat %: 29.6 % Fat Mass (lbs): 38.2 lbs Muscle Mass (lbs): 86.2 lbs Total Body Water (lbs): 61 lbs Visceral Fat Rating : 7    RMR: 1339  Today's Visit #: 19  Starting Date: 04/13/23   Subjective   Chief Complaint: Obesity  Interval History  Discussed the use of AI scribe software for clinical note transcription with the patient, who gave verbal consent to proceed.  History of Present Illness Katherine Beard is a 64 year old female who presents for medical weight management.  She has successfully lost nine pounds since her last visit and is currently adhering to a low-carb, 1000-calorie nutrition plan 90% of the time. She exercises five days a week for 30 to 60 minutes, focusing on cardio and strengthening exercises. Her current weight goal is between 130 and 135 pounds, and she is satisfied with her current weight.  She has a history of back pain, which has improved slightly after a steroid injection, although the relief was short-term. She plans to consult with Dr. Bonner for further management. There have been no changes in her treatment for myeloma, and her recent numbers were good except for her white cell count.  She consumes three meals a day and feels she is getting plenty of protein. She has been able to return to the gym and resume her activities. Her hand grip strength is 66 pounds.  She has a history of osteoporosis and receives Zometa  injections quarterly, which also aid in her  myeloma treatment. A recent full-body x-ray showed improvement, with osteoporosis resolved in the worst areas and only osteopenia remaining. She takes vitamin D  but not calcium  supplements.  She has a family history of high cholesterol, with her mother, sister, and brother on statins, but no family history of heart attacks. She is currently on a cholesterol medication.  Socially, she has stopped drinking alcohol completely, as it made her feel unwell. She occasionally consumes THC/CBD drinks or gummies as a weekend treat. She is mindful of her diet, avoiding ultra-processed foods and focusing on plant-based nutrition to reduce inflammation. She experienced discomfort after eating fast food recently, which she attributes to her cleaner eating habits.     Challenges affecting patient progress: none.    Pharmacotherapy for weight management: She is currently taking Zepbound  10 mg once a week paying for medication out-of-pocket.   Assessment and Plan   Treatment Plan For Obesity:  Recommended Dietary Goals  [name] is currently in the action stage of change. As such, her goal is to continue weight management plan. She has agreed to: continue current plan  Behavioral Health and Counseling  We discussed the following behavioral modification strategies today: continue to work on maintaining a reduced calorie state, getting the recommended amount of protein, incorporating whole foods, making healthy choices, staying well hydrated and practicing mindfulness when eating. and increase protein intake, fibrous foods (25 grams per day for women, 30 grams for men) and water to improve satiety  and decrease hunger signals. .  Additional education and resources provided today: None  Recommended Physical Activity Goals  [name] has been advised to work up to 150 minutes of moderate intensity aerobic activity a week and strengthening exercises 2-3 times per week for cardiovascular health, weight loss  maintenance and preservation of muscle mass.  She has agreed to :  Think about enjoyable ways to increase daily physical activity and overcoming barriers to exercise, Increase physical activity in their day and reduce sedentary time (increase NEAT)., Increase volume of physical activity to a goal of 240 minutes a week, and Combine aerobic and strengthening exercises for efficiency and improved cardiometabolic health.  Medical Interventions and Pharmacotherapy  We discussed various medication options to help [name] with her weight loss efforts and we both agreed to : Adequate clinical response to anti-obesity medication, continue current anti-obesity regimen and continue Zepbound  10 mg once a week consider reductions in the future if she continues to lose weight  Associated Conditions Impacted by Obesity Treatment  Assessment & Plan Generalized obesity with starting BMI 31 Abnormal food appetite Weight: decrease of 49 lb (27.5%) over 2 years  Start: 06/28/2022 178 lb (80.7 kg)  End: 07/09/2024 129 lb (58.5 kg)  She has successfully lost nine pounds since the last visit, adhering to a low-carb, 1000-calorie nutrition plan 90% of the time and exercising five days a week for 30-60 minutes. Her body composition is ideal with a body fat percentage of 29.6%, fat mass of 38 pounds, and a visceral fat rating of 7. Her BMI is 23, and she is satisfied with her current weight between 130 and 135 pounds. The focus is now on transitioning to a maintenance phase to sustain her progress.  Any further weight loss will increase the risk of muscle loss.  Her hand grip strength was adequate at 66 pounds today. - Increase daily caloric intake to 1300 calories. - Continue current exercise regimen of 240 minutes per week. - Continue Zepbound  at 10 mg once a week for now consider down titration of medication as she is now in the maintenance phase.  She expressed some interest in trying Wegovy now that is in a pill form  which may be a reasonable strategy for maintenance. - Will schedule follow-up appointment in early March to assess progress and adjust plan as needed. -She will be having blood work Multiple myeloma, remission status unspecified (HCC) Stable.  Has had skeletal survey which has shown decrease activity.  She is on Zometa  and Revlimid .  She has a history of osteoporosis therefore further weight loss is not recommended. Moderate mixed hyperlipidemia not requiring statin therapy Recommend she have an LPA and high-sensitivity CRP drawn at her next PCPs office visit where she can have some blood work.  This will help with risk stratification as she is interested in coming off statin.          Objective   Physical Exam:  Blood pressure 117/68, pulse 82, temperature 97.9 F (36.6 C), height 5' 2 (1.575 m), weight 129 lb (58.5 kg), last menstrual period 03/06/2011, SpO2 100%. Body mass index is 23.59 kg/m.  General: She is overweight, cooperative, alert, well developed, and in no acute distress. PSYCH: Has normal mood, affect and thought process.   HEENT: EOMI, sclerae are anicteric. Lungs: Normal breathing effort, no conversational dyspnea. Extremities: No edema.  Neurologic: No gross sensory or motor deficits. No tremors or fasciculations noted.    Diagnostic Data Reviewed:  BMET    Component  Value Date/Time   NA 136 06/18/2024 0907   K 3.5 06/18/2024 0907   CL 102 06/18/2024 0907   CO2 24 06/18/2024 0907   GLUCOSE 84 06/18/2024 0907   BUN 14 06/18/2024 0907   CREATININE 0.78 06/18/2024 0907   CALCIUM  9.3 06/18/2024 0907   GFRNONAA >60 06/18/2024 0907   GFRAA 78 12/11/2006 1036   Lab Results  Component Value Date   HGBA1C 5.4 07/13/2022   HGBA1C 5.6 12/11/2006   Lab Results  Component Value Date   INSULIN  4.4 07/13/2022   Lab Results  Component Value Date   TSH 1.360 07/13/2022   CBC    Component Value Date/Time   WBC 2.9 (L) 06/18/2024 0907   WBC 3.9 (L)  09/28/2023 1116   RBC 4.22 06/18/2024 0907   HGB 13.8 06/18/2024 0907   HCT 39.5 06/18/2024 0907   PLT 206 06/18/2024 0907   MCV 93.6 06/18/2024 0907   MCH 32.7 06/18/2024 0907   MCHC 34.9 06/18/2024 0907   RDW 13.8 06/18/2024 0907   Iron Studies    Component Value Date/Time   IRON 65 07/08/2020 1509   FERRITIN 109.9 09/28/2023 1116   IRONPCTSAT 17.4 (L) 07/08/2020 1509   Lipid Panel     Component Value Date/Time   CHOL 134 09/28/2023 1116   CHOL 224 (H) 01/16/2023 1115   TRIG 60.0 09/28/2023 1116   HDL 59.40 09/28/2023 1116   HDL 65 01/16/2023 1115   CHOLHDL 2 09/28/2023 1116   VLDL 12.0 09/28/2023 1116   LDLCALC 62 09/28/2023 1116   LDLCALC 142 (H) 01/16/2023 1115   LDLDIRECT 173.7 11/26/2010 0756   Hepatic Function Panel     Component Value Date/Time   PROT 6.3 (L) 06/18/2024 0907   ALBUMIN 4.2 06/18/2024 0907   AST 15 06/18/2024 0907   ALT 11 06/18/2024 0907   ALKPHOS 23 (L) 06/18/2024 0907   BILITOT 0.7 06/18/2024 0907   BILIDIR 0.1 11/26/2010 0756   IBILI 0.4 11/30/2007 2242      Component Value Date/Time   TSH 1.360 07/13/2022 0931   Nutritional Lab Results  Component Value Date   VD25OH 29.90 (L) 09/28/2023   VD25OH 77.3 01/16/2023   VD25OH 29.6 (L) 07/13/2022    Medications: Outpatient Encounter Medications as of 07/09/2024  Medication Sig Note   acetaminophen  (TYLENOL ) 500 MG tablet Take 500 mg by mouth every 4 (four) hours as needed for moderate pain. Takes 2 tabs Q4H    COMBIPATCH 0.05-0.25 MG/DAY Place 1 patch onto the skin.    desvenlafaxine  (PRISTIQ ) 25 MG 24 hr tablet Take 1 tablet (25 mg total) by mouth daily.    diazepam  (VALIUM ) 5 MG tablet TAKE 1 TABLET BY MOUTH 2 TIMES DAILY AS NEEDED FOR ANXIETY OR MUSCLE SPASMS (DO NOT DRIVE).    diphenhydramine -acetaminophen  (TYLENOL  PM) 25-500 MG TABS tablet Take 2 tablets by mouth as needed.    Ibuprofen 200 MG CAPS Take 600 mg by mouth as needed. 09/21/2020: Takes 2-3 times/day   melatonin 5 MG  TABS Take 10 mg by mouth at bedtime.    methocarbamol  (ROBAXIN ) 500 MG tablet Take 1 tablet (500 mg total) by mouth 2 (two) times daily as needed for muscle spasms.    Nutritional Supplements (VITAL HIGH PROTEIN PO) Take 1 Scoop by mouth daily. Puts in coffee    oxyCODONE  (OXY IR/ROXICODONE ) 5 MG immediate release tablet Take 1-2 tablets (5-10 mg total) by mouth every 8 (eight) hours as needed.    REVLIMID  10  MG capsule TAKE 1 CAPSULE BY MOUTH DAILY  FOR 21 DAYS, THEN 7 DAYS OFF    rosuvastatin  (CRESTOR ) 20 MG tablet TAKE 1 TABLET BY MOUTH EVERY DAY    tirzepatide  (ZEPBOUND ) 10 MG/0.5ML Pen INJECT 10MG  DOSE INTO THE SKIN ONE TIME PER WEEK    Multiple Vitamin (MULTIVITAMIN) capsule Take 1 capsule by mouth daily. (Patient not taking: Reported on 07/09/2024)    No facility-administered encounter medications on file as of 07/09/2024.     Follow-Up   No follow-ups on file.SABRA She was informed of the importance of frequent follow up visits to maximize her success with intensive lifestyle modifications for her multiple health conditions.  Attestation Statement   Reviewed by clinician on day of visit: allergies, medications, problem list, medical history, surgical history, family history, social history, and previous encounter notes.     Lucas Parker, MD  "

## 2024-07-10 ENCOUNTER — Encounter: Payer: Self-pay | Admitting: Oncology

## 2024-07-10 ENCOUNTER — Other Ambulatory Visit: Payer: Self-pay | Admitting: *Deleted

## 2024-07-10 NOTE — Telephone Encounter (Signed)
 MyChart refill request for oxycodone  and needs to pick up by Saturday due to going out of town. NP notified.

## 2024-07-11 ENCOUNTER — Other Ambulatory Visit: Payer: Self-pay | Admitting: Nurse Practitioner

## 2024-07-11 DIAGNOSIS — C9000 Multiple myeloma not having achieved remission: Secondary | ICD-10-CM

## 2024-07-11 MED ORDER — OXYCODONE HCL 5 MG PO TABS: 5.0000 mg | ORAL_TABLET | Freq: Three times a day (TID) | ORAL | 0 refills | Status: DC | PRN

## 2024-07-12 ENCOUNTER — Encounter: Payer: Self-pay | Admitting: *Deleted

## 2024-07-12 ENCOUNTER — Telehealth: Payer: Self-pay | Admitting: *Deleted

## 2024-07-12 NOTE — Telephone Encounter (Signed)
 Called [facility] and spoke with pharmacist regarding need for early refill on oxycodone  and informed them she is going on business trip and will be out before returning. They agreed to early fill this one time only. They say that when script is sent in, it is listed as 30 day supply even though if she takes it as prescribed it would be a 15 day supply.

## 2024-07-31 ENCOUNTER — Other Ambulatory Visit: Payer: Self-pay | Admitting: Oncology

## 2024-07-31 DIAGNOSIS — C9000 Multiple myeloma not having achieved remission: Secondary | ICD-10-CM

## 2024-08-02 ENCOUNTER — Other Ambulatory Visit: Payer: Self-pay | Admitting: Oncology

## 2024-08-02 DIAGNOSIS — C9000 Multiple myeloma not having achieved remission: Secondary | ICD-10-CM

## 2024-08-08 ENCOUNTER — Other Ambulatory Visit: Payer: Self-pay | Admitting: Nurse Practitioner

## 2024-08-08 ENCOUNTER — Encounter: Payer: Self-pay | Admitting: Oncology

## 2024-08-08 DIAGNOSIS — C9000 Multiple myeloma not having achieved remission: Secondary | ICD-10-CM

## 2024-08-08 MED ORDER — OXYCODONE HCL 5 MG PO TABS: 5.0000 mg | ORAL_TABLET | Freq: Three times a day (TID) | ORAL | 0 refills | Status: AC | PRN

## 2024-09-02 ENCOUNTER — Ambulatory Visit (INDEPENDENT_AMBULATORY_CARE_PROVIDER_SITE_OTHER): Admitting: Internal Medicine

## 2024-09-17 ENCOUNTER — Inpatient Hospital Stay

## 2024-09-17 ENCOUNTER — Inpatient Hospital Stay: Admitting: Oncology
